# Patient Record
Sex: Male | Born: 1975 | Race: Black or African American | Hispanic: No | Marital: Married | Smoking: Never smoker
Health system: Southern US, Community
[De-identification: ages and names within clinical notes are randomized; demographics above are authoritative.]

## PROBLEM LIST (undated history)

## (undated) DIAGNOSIS — I509 Heart failure, unspecified: Secondary | ICD-10-CM

## (undated) DIAGNOSIS — I10 Essential (primary) hypertension: Secondary | ICD-10-CM

## (undated) DIAGNOSIS — N529 Male erectile dysfunction, unspecified: Secondary | ICD-10-CM

## (undated) DIAGNOSIS — N183 Chronic kidney disease, stage 3 unspecified: Secondary | ICD-10-CM

## (undated) HISTORY — PX: APPENDECTOMY: SHX54

## (undated) HISTORY — DX: Heart failure, unspecified: I50.9

## (undated) HISTORY — PX: BACK SURGERY: SHX140

## (undated) HISTORY — PX: NASAL SINUS SURGERY: SHX719

## (undated) HISTORY — DX: Chronic kidney disease, stage 3 unspecified: N18.30

## (undated) HISTORY — DX: Male erectile dysfunction, unspecified: N52.9

## (undated) HISTORY — PX: ANKLE SURGERY: SHX546

## (undated) HISTORY — PX: OTHER SURGICAL HISTORY: SHX169

## (undated) HISTORY — PX: SHOULDER ARTHROSCOPY WITH LABRAL REPAIR: SHX5691

---

## 1998-06-21 ENCOUNTER — Encounter: Admission: RE | Admit: 1998-06-21 | Discharge: 1998-09-19 | Payer: Self-pay | Admitting: Anesthesiology

## 1998-11-14 ENCOUNTER — Ambulatory Visit (HOSPITAL_COMMUNITY): Admission: RE | Admit: 1998-11-14 | Discharge: 1998-11-14 | Payer: Self-pay | Admitting: Orthopedic Surgery

## 1998-11-14 ENCOUNTER — Encounter: Payer: Self-pay | Admitting: Orthopedic Surgery

## 2000-07-24 ENCOUNTER — Encounter: Admission: RE | Admit: 2000-07-24 | Discharge: 2000-07-24 | Payer: Self-pay | Admitting: Internal Medicine

## 2000-07-24 ENCOUNTER — Encounter: Payer: Self-pay | Admitting: Internal Medicine

## 2000-12-10 ENCOUNTER — Emergency Department (HOSPITAL_COMMUNITY): Admission: EM | Admit: 2000-12-10 | Discharge: 2000-12-10 | Payer: Self-pay | Admitting: *Deleted

## 2003-05-03 ENCOUNTER — Emergency Department (HOSPITAL_COMMUNITY): Admission: EM | Admit: 2003-05-03 | Discharge: 2003-05-03 | Payer: Self-pay | Admitting: Emergency Medicine

## 2003-05-03 ENCOUNTER — Encounter: Payer: Self-pay | Admitting: Emergency Medicine

## 2004-04-21 ENCOUNTER — Emergency Department (HOSPITAL_COMMUNITY): Admission: EM | Admit: 2004-04-21 | Discharge: 2004-04-21 | Payer: Self-pay | Admitting: Family Medicine

## 2004-04-24 ENCOUNTER — Emergency Department (HOSPITAL_COMMUNITY): Admission: EM | Admit: 2004-04-24 | Discharge: 2004-04-24 | Payer: Self-pay | Admitting: Family Medicine

## 2005-03-02 ENCOUNTER — Emergency Department (HOSPITAL_COMMUNITY): Admission: EM | Admit: 2005-03-02 | Discharge: 2005-03-02 | Payer: Self-pay | Admitting: Family Medicine

## 2005-06-12 ENCOUNTER — Emergency Department (HOSPITAL_COMMUNITY): Admission: EM | Admit: 2005-06-12 | Discharge: 2005-06-12 | Payer: Self-pay | Admitting: Emergency Medicine

## 2005-07-02 ENCOUNTER — Emergency Department (HOSPITAL_COMMUNITY): Admission: EM | Admit: 2005-07-02 | Discharge: 2005-07-03 | Payer: Self-pay | Admitting: Emergency Medicine

## 2005-07-17 ENCOUNTER — Emergency Department (HOSPITAL_COMMUNITY): Admission: EM | Admit: 2005-07-17 | Discharge: 2005-07-17 | Payer: Self-pay | Admitting: Emergency Medicine

## 2005-07-24 ENCOUNTER — Ambulatory Visit: Payer: Self-pay | Admitting: Nurse Practitioner

## 2005-08-01 ENCOUNTER — Ambulatory Visit: Payer: Self-pay | Admitting: *Deleted

## 2005-08-23 ENCOUNTER — Ambulatory Visit: Payer: Self-pay | Admitting: Nurse Practitioner

## 2005-10-04 ENCOUNTER — Ambulatory Visit: Payer: Self-pay | Admitting: Nurse Practitioner

## 2005-10-06 ENCOUNTER — Emergency Department (HOSPITAL_COMMUNITY): Admission: EM | Admit: 2005-10-06 | Discharge: 2005-10-06 | Payer: Self-pay | Admitting: Emergency Medicine

## 2005-10-07 ENCOUNTER — Emergency Department (HOSPITAL_COMMUNITY): Admission: EM | Admit: 2005-10-07 | Discharge: 2005-10-07 | Payer: Self-pay | Admitting: Emergency Medicine

## 2005-10-08 ENCOUNTER — Ambulatory Visit (HOSPITAL_COMMUNITY): Admission: RE | Admit: 2005-10-08 | Discharge: 2005-10-08 | Payer: Self-pay | Admitting: Emergency Medicine

## 2005-10-09 ENCOUNTER — Inpatient Hospital Stay (HOSPITAL_COMMUNITY): Admission: EM | Admit: 2005-10-09 | Discharge: 2005-10-10 | Payer: Self-pay | Admitting: Emergency Medicine

## 2005-10-09 ENCOUNTER — Encounter: Payer: Self-pay | Admitting: Neurological Surgery

## 2005-11-23 ENCOUNTER — Encounter: Admission: RE | Admit: 2005-11-23 | Discharge: 2006-02-21 | Payer: Self-pay | Admitting: Neurological Surgery

## 2006-01-11 ENCOUNTER — Ambulatory Visit: Payer: Self-pay | Admitting: Nurse Practitioner

## 2006-03-27 ENCOUNTER — Ambulatory Visit: Payer: Self-pay | Admitting: Nurse Practitioner

## 2006-04-02 ENCOUNTER — Encounter: Admission: RE | Admit: 2006-04-02 | Discharge: 2006-07-01 | Payer: Self-pay | Admitting: Internal Medicine

## 2006-04-14 ENCOUNTER — Emergency Department (HOSPITAL_COMMUNITY): Admission: EM | Admit: 2006-04-14 | Discharge: 2006-04-14 | Payer: Self-pay | Admitting: Emergency Medicine

## 2006-06-20 ENCOUNTER — Ambulatory Visit: Payer: Self-pay | Admitting: Nurse Practitioner

## 2006-07-12 ENCOUNTER — Ambulatory Visit: Payer: Self-pay | Admitting: Nurse Practitioner

## 2006-09-11 ENCOUNTER — Ambulatory Visit: Payer: Self-pay | Admitting: Nurse Practitioner

## 2006-12-04 ENCOUNTER — Ambulatory Visit: Payer: Self-pay | Admitting: Nurse Practitioner

## 2007-01-22 ENCOUNTER — Ambulatory Visit (HOSPITAL_COMMUNITY): Admission: RE | Admit: 2007-01-22 | Discharge: 2007-01-22 | Payer: Self-pay | Admitting: Neurological Surgery

## 2007-02-20 ENCOUNTER — Encounter: Admission: RE | Admit: 2007-02-20 | Discharge: 2007-02-20 | Payer: Self-pay | Admitting: Orthopedic Surgery

## 2007-02-24 ENCOUNTER — Inpatient Hospital Stay (HOSPITAL_COMMUNITY): Admission: RE | Admit: 2007-02-24 | Discharge: 2007-03-01 | Payer: Self-pay | Admitting: Neurological Surgery

## 2007-08-04 ENCOUNTER — Encounter: Admission: RE | Admit: 2007-08-04 | Discharge: 2007-08-04 | Payer: Self-pay | Admitting: Orthopedic Surgery

## 2007-12-24 ENCOUNTER — Emergency Department (HOSPITAL_COMMUNITY): Admission: EM | Admit: 2007-12-24 | Discharge: 2007-12-24 | Payer: Self-pay | Admitting: Emergency Medicine

## 2008-01-28 ENCOUNTER — Ambulatory Visit: Payer: Self-pay | Admitting: Family Medicine

## 2008-02-07 ENCOUNTER — Emergency Department (HOSPITAL_COMMUNITY): Admission: EM | Admit: 2008-02-07 | Discharge: 2008-02-07 | Payer: Self-pay | Admitting: Family Medicine

## 2008-04-21 ENCOUNTER — Encounter: Admission: RE | Admit: 2008-04-21 | Discharge: 2008-06-10 | Payer: Self-pay | Admitting: Orthopedic Surgery

## 2008-06-21 ENCOUNTER — Ambulatory Visit (HOSPITAL_COMMUNITY): Admission: RE | Admit: 2008-06-21 | Discharge: 2008-06-21 | Payer: Self-pay | Admitting: Urology

## 2009-06-02 ENCOUNTER — Emergency Department (HOSPITAL_COMMUNITY): Admission: EM | Admit: 2009-06-02 | Discharge: 2009-06-02 | Payer: Self-pay | Admitting: Emergency Medicine

## 2009-11-22 ENCOUNTER — Encounter (INDEPENDENT_AMBULATORY_CARE_PROVIDER_SITE_OTHER): Payer: Self-pay | Admitting: Adult Health

## 2009-11-22 ENCOUNTER — Ambulatory Visit: Payer: Self-pay | Admitting: Internal Medicine

## 2009-11-22 LAB — CONVERTED CEMR LAB
ALT: 30 units/L (ref 0–53)
AST: 18 units/L (ref 0–37)
Albumin: 4.5 g/dL (ref 3.5–5.2)
Alkaline Phosphatase: 66 units/L (ref 39–117)
BUN: 11 mg/dL (ref 6–23)
CO2: 26 meq/L (ref 19–32)
Calcium: 8.7 mg/dL (ref 8.4–10.5)
Chlamydia, Swab/Urine, PCR: NEGATIVE
Chloride: 103 meq/L (ref 96–112)
Creatinine, Ser: 1.34 mg/dL (ref 0.40–1.50)
GC Probe Amp, Urine: NEGATIVE
Glucose, Bld: 91 mg/dL (ref 70–99)
Hgb A1c MFr Bld: 5.9 % (ref 4.6–6.1)
Microalb, Ur: 10.56 mg/dL — ABNORMAL HIGH (ref 0.00–1.89)
Potassium: 3.7 meq/L (ref 3.5–5.3)
Sodium: 142 meq/L (ref 135–145)
Total Bilirubin: 0.7 mg/dL (ref 0.3–1.2)
Total Protein: 6.8 g/dL (ref 6.0–8.3)
Vit D, 25-Hydroxy: <10 ng/mL — ABNORMAL LOW (ref 30–89)

## 2009-12-09 ENCOUNTER — Ambulatory Visit: Payer: Self-pay | Admitting: Internal Medicine

## 2009-12-09 ENCOUNTER — Encounter (INDEPENDENT_AMBULATORY_CARE_PROVIDER_SITE_OTHER): Payer: Self-pay | Admitting: Adult Health

## 2009-12-09 LAB — CONVERTED CEMR LAB
BUN: 11 mg/dL (ref 6–23)
CO2: 26 meq/L (ref 19–32)
Calcium: 9.3 mg/dL (ref 8.4–10.5)
Chloride: 104 meq/L (ref 96–112)
Cholesterol: 161 mg/dL (ref 0–200)
Creatinine, Ser: 1.23 mg/dL (ref 0.40–1.50)
Glucose, Bld: 94 mg/dL (ref 70–99)
HDL: 50 mg/dL (ref >39)
LDL Cholesterol: 102 mg/dL — ABNORMAL HIGH (ref 0–99)
Potassium: 3.9 meq/L (ref 3.5–5.3)
Sodium: 141 meq/L (ref 135–145)
Total CHOL/HDL Ratio: 3.2
Triglycerides: 47 mg/dL (ref <150)
VLDL: 9 mg/dL (ref 0–40)

## 2009-12-12 ENCOUNTER — Emergency Department (HOSPITAL_COMMUNITY): Admission: EM | Admit: 2009-12-12 | Discharge: 2009-12-12 | Payer: Self-pay | Admitting: Emergency Medicine

## 2010-09-02 ENCOUNTER — Emergency Department (HOSPITAL_COMMUNITY)
Admission: EM | Admit: 2010-09-02 | Discharge: 2010-09-02 | Payer: Self-pay | Source: Home / Self Care | Admitting: Family Medicine

## 2010-12-09 ENCOUNTER — Encounter: Payer: Self-pay | Admitting: Neurosurgery

## 2011-04-06 NOTE — Discharge Summary (Signed)
[name], [name]              ACCOUNT NO.:  [id number]   MEDICAL RECORD NO.:  [id number]          PATIENT TYPE:  INP   LOCATION:  3002                         FACILITY:  [facility]   PHYSICIAN:  Stefani Dama, M.D.  DATE OF BIRTH:  September 11, 1976   DATE OF ADMISSION:  10/09/2005  DATE OF DISCHARGE:  10/10/2005                                 DISCHARGE SUMMARY   CONDITION ON DISCHARGE:  Improved.   HOSPITAL COURSE:  [name] is a 35 year old individual who has had a  severe amount of back and right lower extremity pain with weakness in his  gastroc. He was treated conservatively; and had received extensive  parenteral and oral pain medications which was not controlling his pain; and  he developed difficulty controlling his urinary bladder. Whether this was  due to actual compression of his conus or medication effect is undetermined;  however, I suspect that this was largely due to the amount of medication  that the patient was receiving.  Nonetheless he was found to have a large  herniated nucleus pulposus at the L5-S1 level with compression of the right-  sided S1 nerve roots; and he had evidence of a weak gastroc on that right  side. He could not stand to bear weight; and despite maximum pain  medications to where the patient would be falling asleep, when he would be  stimulated to do on exam, he complained of excruciating pain. He was taken  to the operating room on 11/21 where he underwent surgical decompression of  a large fragment of disk at the L5-S1 level. Postoperatively he is feeling  considerably better though he still notes that he gets tingling into the  buttock region and some radicular pain in that right lower extremity. He is  now tolerating oral pain medications.  He has been ambulating and he has  control of his bladder. He is discharged home with prescription for Percocet  #60, without refills; Valium 5 mg #30, without refills. He will be seen in  the office in  three weeks' time.  His incision is clean and dry.   CONDITION ON DISCHARGE:  Improved.      Stefani Dama, M.D.  Electronically Signed     HJE/MEDQ  D:  10/10/2005  T:  10/10/2005  Job:  [id number]

## 2011-04-06 NOTE — Op Note (Signed)
NAMEVIRGIL, [name]              ACCOUNT NO.:  [id number]   MEDICAL RECORD NO.:  [id number]          PATIENT TYPE:  INP   LOCATION:  3012                         FACILITY:  [facility]   PHYSICIAN:  Stefani Dama, M.D.  DATE OF BIRTH:  10/31/1976   DATE OF PROCEDURE:  02/24/2007  DATE OF DISCHARGE:                               OPERATIVE REPORT   PREOPERATIVE DIAGNOSIS:  Recurrent herniated nucleus pulposus, L4-L5,  with spondylosis and radiculopathy.   POSTOPERATIVE DIAGNOSIS:  Repeat diskectomy at L4-L5, on the right with  decompression of L5 and S1 nerve roots, transforaminal decompression and  arthrodesis using TLIF bone spacer and local autograft and allograft,  segmental pedicle screw fixation L4-L5, posterolateral arthrodesis L4-L5  with local autograft and allograft.   SURGEON:  Dr. Danielle Dess.   FIRST ASSISTANT:  Dr. Delma Officer.   ANESTHESIA:  General endotracheal.   INDICATIONS:  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  He has a transitional vertebrae of  the L5-S1 segment, and the first moveable segment is actually L4-L5.  This is where his previous diskectomy was.  He now has recurrent disk  herniation and compromise of the nerve root with right sided radicular  pain.  He is taken to the operating room to undergo not only  decompression but stabilization, as he has developed significant  spondylitic degeneration and anterolisthesis.   PROCEDURE:  The patient was brought to the operating room supine on the  stretcher.  After smooth induction of general endotracheal anesthesia,  he was turned prone.  The back was prepped with DuraPrep and draped in a  sterile fashion.  A midline incision was creating and carried down to  the lumbodorsal fascia.  Previous scar was excised with an elliptical  incision around it.  The dissection was then carried down on  the right  side to expose the interlaminar space of L4 and L5, and the scar in this  area was released.  A large laminotomy was created, removing the  inferior portion of the lamina and facet process at L4.  This exposed  the common dural tube, and dissection was then undertaken along the  nerve root.  Here, the traversing nerve root exiting was typical for a  L4 nerve root.  However, the inferior nerve root look much more like a  S1 nerve root as it traveled straight down the lateral aspect of the  lamina and hugged the facette.  The scar in this area was released and  elevated, and the common dural tube and the nerve roots involved in this  segment were decompressed.  Transforaminal diskectomy was performed at  this level, and the disk was evacuated completely through this right-  sided approach.  Once the disk was evacuated and nerve roots were much  more decompressed, further decompression yielded the endplates, and the  endplates were removed with sharp curettage to increase the size of  space.  Ultimately, the interspace could be filled with a spacer which  was initially measured at 10 mm.  However, ultimately, a 12-mm spacer  could be fitted into this interspace.  Once the endplates were  completely curetted and prepared for grafting, a TLIF bone spacer was  tamped into position and centered.  The rest of the disk space was then  filled with local autograft and allograft to complete the posterior  interbody fusion.  Then, pedicle screws were placed in pedicles of L4-L5  with fluoroscopic guidance.  These were 6.5 x 50-mm screws in L4 and 6.5  x 45-mm screws in L5.  Radiographic confirmation with fluoroscopy was  obtained in both the AP and lateral projections.  Short 35-mm rods were  then used to connect the rods on the right side, and a 40-mm rod was  required on the left side.  Posterolateral grafting was then performed  in the lateral gutters that were decorticated previously.   The remainder  of the bone that was obtained along with some Helios sponge that was  packed into the lateral gutters.  Hemostasis was then obtained in the  soft tissues, and then the lumbodorsal fascia was reapproximated with #1  Vicryl in an interrupted fashion, 2-0 Vicryl was used subcutaneously, 3-  0 Vicryl subcuticularly.  Dry sterile dressing was applied to the skin.  Blood loss was estimated at 450 mL; 200 mL of Cell Saver blood was  returned to the patient.      Stefani Dama, M.D.  Electronically Signed     HJE/MEDQ  D:  02/28/2007  T:  03/01/2007  Job:  [id number]

## 2011-04-06 NOTE — Op Note (Signed)
[name], [name]              ACCOUNT NO.:  [id number]   MEDICAL RECORD NO.:  [id number]          PATIENT TYPE:  INP   LOCATION:  3002                         FACILITY:  [facility]   PHYSICIAN:  Stefani Dama, M.D.  DATE OF BIRTH:  01-31-1976   DATE OF PROCEDURE:  DATE OF DISCHARGE:                                 OPERATIVE REPORT   PREOPERATIVE DIAGNOSIS:  Herniated nucleus pulposus L5-S1 right with right  lumbar radiculopathy.   POSTOPERATIVE DIAGNOSIS:  Herniated nucleus pulposus L5-S1 right with right  lumbar radiculopathy.   PROCEDURE:  L5-S1 microdiskectomy on the right with operating microscope,  microdissection technique.   SURGEON:  Stefani Dama, M.D.   FIRST ASSISTANT:  Hilda Lias, M.D.   ANESTHESIA:  General endotracheal.   INDICATIONS:  [name] is a 35 year old individual who has had  significant back and right lower extremity pain with weakness in the  gastrocnemius.  He has evidence of compression of the S1 nerve root on that  right side.  He has been advised regarding surgical decompression as he has  been intractable with his pain; and the weakness is prominent.   PROCEDURE:  The patient was brought to the operating room supine on a  stretcher. After the smooth induction of general endotracheal anesthesia, he  was turned prone; the back was prepped with DuraPrep, and draped in sterile  fashion. A midline incision was created and carried down to lumbodorsal  fascia which was opened on the right side of midline.  The paraspinous  musculature was stripped from the interspace at the L5-S1 space; and this  space was identified positively with a radiograph. A laminotomy was then  created removing the inferior margin of the lamina of L5 out to the mesial  wall of the facet.  A partial facetectomy was completed also.  The common  dural tube was then identified and care was taken to protect a very  tenuously stretched S1 nerve root over a substantial  mass.  By dissecting  from lateral to medial, the nerve was gently moved off of a substantial  mass. This was incised on the lateral aspect and was found to be a large  fragment of disk with a thin veil of posterior longitudinal ligament over  it. Several fragments were removed and this allowed for good decompression  immediately of the S1 nerve root. Care was taken to explore the area and  several other fragments were found. These were partially adherent to  substantial thickening in the posterior longitudinal ligament.  These  fragments were resected and then on the medial aspect; there was noted be a  tear into the disk space and palpating here with a curved hook revealed some  other fragments of disk that were about to come through this opening.  It  was felt that because of the degree of degeneration that was present, in  this disk space, that this space should be cleaned out of any loose and  degenerated material.   For that reason the posterior longitudinal ligament was then opened fully  with a 15-blade and a formal diskectomy  was undertaken at the L5-S1 space.  All this work was done with the help of Dr. Jeral Fruit who performed the lateral  portion of the operation; and all this work was also done under the  operating microscope, including the laminotomy and facetectomy; and then, of  course, this microdiskectomy.  In the end, the disk space was evacuated of  significant quantity of severely degenerated disk material. The nerve root  was decompressed both at S1 and then a central portion of the canal above  this area. Hemostasis was achieved in the soft tissues and 15 mg of Toradol  was left in the  epidural space; and ultimately the wound was closed with #1 Vicryl  interrupted fashion, and 2-0 Vicryl in the subcutaneous tissues and 3-0  Vicryl subcuticularly. Dermabond was placed on the skin. The patient  tolerated procedure well was returned to recovery in stable  condition.      Stefani Dama, M.D.  Electronically Signed     HJE/MEDQ  D:  10/09/2005  T:  10/10/2005  Job:  [id number]

## 2011-04-06 NOTE — Discharge Summary (Signed)
[name], [name]              ACCOUNT NO.:  [id number]   MEDICAL RECORD NO.:  [id number]          PATIENT TYPE:  INP   LOCATION:  3012                         FACILITY:  [facility]   PHYSICIAN:  Stefani Dama, M.D.  DATE OF BIRTH:  [birth year]   DATE OF ADMISSION:  02/24/2007  DATE OF DISCHARGE:  03/01/2007                               DISCHARGE SUMMARY   ADMITTING DIAGNOSIS:  Recurrent herniated nucleus pulposus, L4-5, with  spondylosis and radiculopathy.   DISCHARGE AND FINAL DIAGNOSIS:  Recurrent herniated nucleus pulposus, L4-  5, with spondylosis and radiculopathy.   CONDITION ON DISCHARGE:  Improving.   HOSPITAL COURSE:  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  The patient has  an apparent recurrent disk herniation with right lumbar radiculopathy.  He is advised regarding surgical decompression in addition to  arthrodesis as he has developed degenerative changes that are more  severe with slight anterolisthesis of the L4-5 segment.  He was taken to  the operating room on the 7th, where he underwent this procedure and he  tolerated it well.  Postoperatively he has been mobilized.  His incision  has remained clean and dry.  He did have substantial amounts of pain as  the patient is a large, muscular fellow.  This was relieved with some  difficulty using PCA.  He has now been mobilized and is independent in  is function.  Bowel and bladder function appears to be returned to  normal.   He is given a prescription for Percocet #60 without refills, Valium 5 mg  #40 without refills.  Will be seen in the office in 3 weeks' time for  further follow-up.      Stefani Dama, M.D.  Electronically Signed     HJE/MEDQ  D:  02/28/2007  T:  03/01/2007  Job:  [id number]

## 2011-04-06 NOTE — H&P (Signed)
NAMESTEFANOS, [name]              ACCOUNT NO.:  [id number]   MEDICAL RECORD NO.:  [id number]          PATIENT TYPE:  INP   LOCATION:  3002                         FACILITY:  [facility]   PHYSICIAN:  Stefani Dama, M.D.  DATE OF BIRTH:  June 04, 1976   DATE OF ADMISSION:  10/09/2005  DATE OF DISCHARGE:                                HISTORY & PHYSICAL   ADMITTING DIAGNOSIS:  Herniated nucleus pulposus, L5-S1 on the right, with  right lumbar radiculopathy.   HISTORY OF PRESENT ILLNESS:  The patient is a 35 year old right-handed  individual who has had severe back pain for the last 10 years and started  having back and right lower extremity pain several days ago that was quite  severe.  He notes that the first right lower extremity pain might have  started as far back as in February.  He tells me that 10 years ago he had a  work-related injury where he ended up with some back pain, and on and off  for the last 10 years he has had recurrent episodes of back pain.  He had an  MRI in August and has been treated with various anti-inflammatory  medications, steroid dose packs, various pain pills, and in the past 3 days  had showed up in the emergency department daily since this past Friday.  Each time, he was given additional pain medication and found that his pain  could not be controlled.  This evening when he presented to the emergency  department, he was given an injection for pain medication.  He subsequently  found that he could not control his bladder, soiled himself.  An MRI was  ordered, and this demonstrates the presence of a herniated nucleus pulposus  at L5-S1, eccentric to the right side.  The MRI had actually been scheduled  for the patient to have today.  He had been seen on October 05, 2005 by my  partner, Dr. Orbie Hurst.  Because of the recent problem with the urination,  I was asked to see the patient.  I advised admission.  He was transferred  from [facility] to  [facility].  He notes that he has pain in the  entirety of the right leg with numbness down to the bottom of the foot.  The  right foot feels weak, but it is extremely difficult to move.  He reveals  that he has great difficulty getting up and moving, and bearing any weight  on that right leg is extremely uncomfortable and very painful.   PAST MEDICAL HISTORY:  1.  The patient tells me he has a murmur, but has no other medical problems.  2.  He may have had a slight bit of asthma at one time also.   PAST SURGICAL HISTORY:  He has not had any previous surgeries.   ALLERGIES:  No known drug allergies.   CURRENT MEDICATIONS:  He has been on a whole host of nonsteroidal anti-  inflammatories including -  1.  Ibuprofen.  2.  Vicodin for pain.  3.  Percocet for pain.   SOCIAL HISTORY:  He  is married.  He is unemployed, and he does not smoke or  use alcohol.   SYSTEMS REVIEW/FAMILY HISTORY:  Reviewed with the patient, and not  significant for any notable findings, particularly in the entire systems  review, except for the history of the asthma, for which the patient does not  take any medication at the current time.   PHYSICAL EXAMINATION:  GENERAL:  He is an arousable individual who is quite  sedated with a good bit of pain medication.  He is currently on a PCA  morphine pump.  When he does arouse, he moves about turning left to right in  a log roll fashion very rigidly, and he notes that he has excruciating pain  with even slight turning to the right or to the left.  Straight leg raising  on the right side is markedly positive.  The patient displays weakness in  the gastroc on that right side, which can be overcome with hand strength.  His tibialis anterior strength appears 5/5 bilaterally.  His sensation is  intact in the distal lower extremities, though diminished on the compared to  the left.  Reflexes absent in the right Achilles.  Straight leg raising is  markedly positive with any  motion of that right leg.  Cross straight leg  raising is positive at 15 degrees on the left side for the right pain.  Patrick's maneuver is negative on the left side; it cannot be performed on  the right side, as he cannot bend his hip in that direction.  Palpation over  his back reveals that he has severe paraspinous muscular spasm that is  easily notable.  HEENT:  Normal, except for halitosis.  NECK:  No masses.  No bruits are heard.  LUNG:  Clear to auscultation.  HEART:  Regular rate and rhythm.  ABDOMEN:  Soft.  Bowel sounds positive.  No masses are palpable.  EXTREMITIES:  No clubbing, cyanosis, or edema.   IMPRESSION:  The patient has evidence of a herniated nucleus pulposus at L5-  S1 eccentric to the right side.  He is now taken to the operating room to  undergo surgical decompression of this disk herniation.      Stefani Dama, M.D.  Electronically Signed     HJE/MEDQ  D:  10/09/2005  T:  10/09/2005  Job:  [id number]

## 2011-04-25 ENCOUNTER — Inpatient Hospital Stay (INDEPENDENT_AMBULATORY_CARE_PROVIDER_SITE_OTHER)
Admission: RE | Admit: 2011-04-25 | Discharge: 2011-04-25 | Disposition: A | Discharge status: Home / Self Care | Payer: Medicare Other | Source: Ambulatory Visit | Attending: Family Medicine | Admitting: Family Medicine

## 2011-04-25 ENCOUNTER — Emergency Department (HOSPITAL_COMMUNITY)
Admission: EM | Admit: 2011-04-25 | Discharge: 2011-04-25 | Disposition: A | Discharge status: Home / Self Care | Payer: Medicare Other | Attending: Emergency Medicine | Admitting: Emergency Medicine

## 2011-04-25 DIAGNOSIS — R7989 Other specified abnormal findings of blood chemistry: Secondary | ICD-10-CM

## 2011-04-25 DIAGNOSIS — J45909 Unspecified asthma, uncomplicated: Secondary | ICD-10-CM | POA: Insufficient documentation

## 2011-04-25 DIAGNOSIS — R631 Polydipsia: Secondary | ICD-10-CM | POA: Insufficient documentation

## 2011-04-25 DIAGNOSIS — R358 Other polyuria: Secondary | ICD-10-CM | POA: Insufficient documentation

## 2011-04-25 DIAGNOSIS — I10 Essential (primary) hypertension: Secondary | ICD-10-CM | POA: Insufficient documentation

## 2011-04-25 DIAGNOSIS — E1169 Type 2 diabetes mellitus with other specified complication: Secondary | ICD-10-CM | POA: Insufficient documentation

## 2011-04-25 DIAGNOSIS — R3589 Other polyuria: Secondary | ICD-10-CM | POA: Insufficient documentation

## 2011-04-25 DIAGNOSIS — K219 Gastro-esophageal reflux disease without esophagitis: Secondary | ICD-10-CM | POA: Insufficient documentation

## 2011-04-25 DIAGNOSIS — H538 Other visual disturbances: Secondary | ICD-10-CM | POA: Insufficient documentation

## 2011-04-25 LAB — POCT I-STAT, CHEM 8
BUN: 16 mg/dL (ref 6–23)
Calcium, Ion: 1.18 mmol/L (ref 1.12–1.32)
Chloride: 92 mEq/L — ABNORMAL LOW (ref 96–112)
Creatinine, Ser: 1.7 mg/dL — ABNORMAL HIGH (ref 0.4–1.5)
Glucose, Bld: 604 mg/dL (ref 70–99)
HCT: 50 % (ref 39.0–52.0)
Hemoglobin: 17 g/dL (ref 13.0–17.0)
Potassium: 4.9 mEq/L (ref 3.5–5.1)
Sodium: 133 mEq/L — ABNORMAL LOW (ref 135–145)
TCO2: 30 mmol/L (ref 0–100)

## 2011-04-25 LAB — BASIC METABOLIC PANEL
BUN: 15 mg/dL (ref 6–23)
CO2: 30 mEq/L (ref 19–32)
Calcium: 9.5 mg/dL (ref 8.4–10.5)
Chloride: 95 mEq/L — ABNORMAL LOW (ref 96–112)
Creatinine, Ser: 1.41 mg/dL (ref 0.4–1.5)
GFR calc Af Amer: >60 mL/min (ref >60)
GFR calc non Af Amer: 57 mL/min — ABNORMAL LOW (ref >60)
Glucose, Bld: 448 mg/dL — ABNORMAL HIGH (ref 70–99)
Potassium: 4.3 mEq/L (ref 3.5–5.1)
Sodium: 135 mEq/L (ref 135–145)

## 2011-04-25 LAB — GLUCOSE, CAPILLARY
Glucose-Capillary: >600 mg/dL (ref 70–99)
Glucose-Capillary: 465 mg/dL — ABNORMAL HIGH (ref 70–99)
Glucose-Capillary: 368 mg/dL — ABNORMAL HIGH (ref 70–99)

## 2011-04-25 LAB — POCT URINALYSIS DIP (DEVICE)
Bilirubin Urine: NEGATIVE
Glucose, UA: 500 mg/dL — AB
Hgb urine dipstick: NEGATIVE
Ketones, ur: NEGATIVE mg/dL
Nitrite: NEGATIVE
Protein, ur: NEGATIVE mg/dL
Specific Gravity, Urine: <=1.005 (ref 1.005–1.030)
Urobilinogen, UA: 0.2 mg/dL (ref 0.0–1.0)
pH: 6 (ref 5.0–8.0)

## 2011-08-22 ENCOUNTER — Emergency Department (HOSPITAL_COMMUNITY)
Admission: EM | Admit: 2011-08-22 | Discharge: 2011-08-23 | Disposition: A | Discharge status: Home / Self Care | Payer: Medicare Other | Attending: Emergency Medicine | Admitting: Emergency Medicine

## 2011-08-22 DIAGNOSIS — E119 Type 2 diabetes mellitus without complications: Secondary | ICD-10-CM | POA: Insufficient documentation

## 2011-08-22 DIAGNOSIS — I10 Essential (primary) hypertension: Secondary | ICD-10-CM | POA: Insufficient documentation

## 2011-08-22 DIAGNOSIS — S0180XA Unspecified open wound of other part of head, initial encounter: Secondary | ICD-10-CM | POA: Insufficient documentation

## 2011-08-22 DIAGNOSIS — Z794 Long term (current) use of insulin: Secondary | ICD-10-CM | POA: Insufficient documentation

## 2011-08-22 DIAGNOSIS — IMO0002 Reserved for concepts with insufficient information to code with codable children: Secondary | ICD-10-CM | POA: Insufficient documentation

## 2012-01-30 ENCOUNTER — Emergency Department: Payer: Self-pay | Admitting: Emergency Medicine

## 2012-01-30 DIAGNOSIS — S0180XA Unspecified open wound of other part of head, initial encounter: Secondary | ICD-10-CM | POA: Diagnosis not present

## 2012-01-30 DIAGNOSIS — IMO0002 Reserved for concepts with insufficient information to code with codable children: Secondary | ICD-10-CM | POA: Diagnosis not present

## 2012-02-07 ENCOUNTER — Emergency Department: Payer: Self-pay | Admitting: Emergency Medicine

## 2012-02-07 DIAGNOSIS — R040 Epistaxis: Secondary | ICD-10-CM | POA: Diagnosis not present

## 2012-02-07 DIAGNOSIS — I10 Essential (primary) hypertension: Secondary | ICD-10-CM | POA: Diagnosis not present

## 2012-02-07 DIAGNOSIS — R6889 Other general symptoms and signs: Secondary | ICD-10-CM | POA: Diagnosis not present

## 2012-02-07 DIAGNOSIS — F411 Generalized anxiety disorder: Secondary | ICD-10-CM | POA: Diagnosis not present

## 2012-02-07 DIAGNOSIS — Z79899 Other long term (current) drug therapy: Secondary | ICD-10-CM | POA: Diagnosis not present

## 2012-02-08 LAB — CBC
HCT: 45.2 % (ref 40.0–52.0)
HGB: 15.1 g/dL (ref 13.0–18.0)
MCH: 28.6 pg (ref 26.0–34.0)
MCHC: 33.3 g/dL (ref 32.0–36.0)
MCV: 86 fL (ref 80–100)
Platelet: 144 10*3/uL — ABNORMAL LOW (ref 150–440)
RBC: 5.27 10*6/uL (ref 4.40–5.90)
RDW: 13.9 % (ref 11.5–14.5)
WBC: 3.7 10*3/uL — ABNORMAL LOW (ref 3.8–10.6)

## 2012-02-08 LAB — COMPREHENSIVE METABOLIC PANEL
Albumin: 4.1 g/dL (ref 3.4–5.0)
Alkaline Phosphatase: 69 U/L (ref 50–136)
Anion Gap: 9 (ref 7–16)
BUN: 18 mg/dL (ref 7–18)
Bilirubin,Total: 0.5 mg/dL (ref 0.2–1.0)
Calcium, Total: 8.6 mg/dL (ref 8.5–10.1)
Chloride: 104 mmol/L (ref 98–107)
Co2: 28 mmol/L (ref 21–32)
Creatinine: 1.27 mg/dL (ref 0.60–1.30)
EGFR (African American): >60
EGFR (Non-African Amer.): >60
Glucose: 89 mg/dL (ref 65–99)
Osmolality: 283 (ref 275–301)
Potassium: 3.9 mmol/L (ref 3.5–5.1)
SGOT(AST): 23 U/L (ref 15–37)
SGPT (ALT): 37 U/L
Sodium: 141 mmol/L (ref 136–145)
Total Protein: 7.7 g/dL (ref 6.4–8.2)

## 2012-02-09 ENCOUNTER — Emergency Department (INDEPENDENT_AMBULATORY_CARE_PROVIDER_SITE_OTHER)
Admission: EM | Admit: 2012-02-09 | Discharge: 2012-02-09 | Disposition: A | Discharge status: Nursing Home (Includes ICF) | Payer: Medicare Other | Source: Home / Self Care | Attending: Emergency Medicine | Admitting: Emergency Medicine

## 2012-02-09 ENCOUNTER — Other Ambulatory Visit: Payer: Self-pay

## 2012-02-09 ENCOUNTER — Encounter (HOSPITAL_COMMUNITY): Payer: Self-pay | Admitting: *Deleted

## 2012-02-09 ENCOUNTER — Encounter (HOSPITAL_COMMUNITY): Payer: Self-pay | Admitting: Emergency Medicine

## 2012-02-09 ENCOUNTER — Emergency Department (HOSPITAL_COMMUNITY)
Admission: EM | Admit: 2012-02-09 | Discharge: 2012-02-09 | Disposition: A | Discharge status: Home / Self Care | Payer: Medicare Other | Attending: Emergency Medicine | Admitting: Emergency Medicine

## 2012-02-09 DIAGNOSIS — I10 Essential (primary) hypertension: Secondary | ICD-10-CM | POA: Diagnosis not present

## 2012-02-09 DIAGNOSIS — R040 Epistaxis: Secondary | ICD-10-CM

## 2012-02-09 DIAGNOSIS — E119 Type 2 diabetes mellitus without complications: Secondary | ICD-10-CM | POA: Diagnosis not present

## 2012-02-09 HISTORY — DX: Essential (primary) hypertension: I10

## 2012-02-09 LAB — POCT I-STAT, CHEM 8
BUN: 17 mg/dL (ref 6–23)
Calcium, Ion: 1.25 mmol/L (ref 1.12–1.32)
Chloride: 105 mEq/L (ref 96–112)
Creatinine, Ser: 1.3 mg/dL (ref 0.50–1.35)
Glucose, Bld: 75 mg/dL (ref 70–99)
HCT: 45 % (ref 39.0–52.0)
Hemoglobin: 15.3 g/dL (ref 13.0–17.0)
Potassium: 4 mEq/L (ref 3.5–5.1)
Sodium: 145 mEq/L (ref 135–145)
TCO2: 29 mmol/L (ref 0–100)

## 2012-02-09 MED ORDER — OXYMETAZOLINE HCL 0.05 % NA SOLN
1.0000 | Freq: Once | NASAL | Status: AC
  Administered 2012-02-09: 1 via NASAL
  Filled 2012-02-09: qty 15

## 2012-02-09 MED ORDER — METOPROLOL TARTRATE 1 MG/ML IV SOLN: 10.0000 mg | Freq: Once | INTRAVENOUS | Status: DC

## 2012-02-09 NOTE — Discharge Instructions (Signed)

## 2012-02-09 NOTE — ED Notes (Signed)
Called to treatment room during exam.  When head leaned back, bleeding resumed profusely.  Patient has nasal packing in left nare as placed by dr Ladon Applebaum.  Patient continues to have dripping blood from right nare, reports bleeding in back of throat continues.  Patient continues to talk and joke.

## 2012-02-09 NOTE — ED Notes (Addendum)
Pt resting quietly at the time. Vital signs stable. No active bleeding noted. Pt denies pain. Remains alert and oriented x4. No signs of distress noted at present. Family at bedside.

## 2012-02-09 NOTE — ED Notes (Addendum)
Patient transferred from [facility] due to having uncontrolled nose bleed and hypertension.  Patient with packing to the left nare.  Patient states the bleeding seems to have slowed down.  Patient recently started on altace for bp control.  Patient with no s/sx of distress.  Patient reports onset of bleeding today at 12noon.  Patient had similar event on Wed of this week and was treated and released.  Patient denies any pain.  Denies headache

## 2012-02-09 NOTE — ED Provider Notes (Signed)
History     CSN: [id number]  Arrival date & time 02/09/12  1226   First MD Initiated Contact with Patient 02/09/12 1234      Chief Complaint  Patient presents with  . Epistaxis    (Consider location/radiation/quality/duration/timing/severity/associated sxs/prior treatment) HPI Comments: Around 12 noon today was just inside of  Car and started bleeding,  I believe from my left side first then on my right side it was abundant to the point that it made me gag and cough several times. This past Wednesday was seen at [facility] as I had also bleeding from my nose and was told my blood pressure was elevated they treated me by spray my nose was open and placing some cotton balls after the bleeding stopped recently homes the next morning.   On nasal bleeding patient denies any physical effort, no speaking, cough, or localize injury or trauma. "I was just sitting in a car"  Patient is a 36 y.o. male presenting with nosebleeds. The history is provided by the patient and a relative.  Epistaxis  This is a recurrent problem. The current episode started 1 to 2 hours ago. The problem occurs constantly. The problem has been gradually worsening. The problem is associated with an unknown factor. The bleeding has been from both nares. The treatment provided no relief. His past medical history is significant for frequent nosebleeds. His past medical history does not include bleeding disorder or nose-picking.    Past Medical History  Diagnosis Date  . Hypertension   . Diabetes mellitus     Past Surgical History  Procedure Date  . Back surgery   . Back fusion     l4,l5  . Ankle surgery     both     History reviewed. No pertinent family history.  History  Substance Use Topics  . Smoking status: Never Smoker   . Smokeless tobacco: Not on file  . Alcohol Use: No      Review of Systems  Constitutional: Negative for diaphoresis, activity change and fatigue.  HENT: Positive for  nosebleeds. Negative for congestion and rhinorrhea.   Respiratory: Negative for cough and shortness of breath.   Cardiovascular: Negative for palpitations and leg swelling.    Allergies  Review of patient's allergies indicates no known allergies.  Home Medications   Current Outpatient Rx  Name Route Sig Dispense Refill  . AUGMENTIN PO Oral Take by mouth.    Marland Kitchen RAMIPRIL 10 MG PO TABS Oral Take 10 mg by mouth 2 (two) times daily.      BP 204/137  Pulse 79  Temp(Src) 97.6 F (36.4 C) (Oral)  Resp 20  SpO2 97%  Physical Exam  Constitutional: He appears distressed.  HENT:  Head: Normocephalic.  Nose: Epistaxis is observed.    Mouth/Throat: Uvula is midline, oropharynx is clear and moist and mucous membranes are normal.  Eyes: Conjunctivae are normal.  Neck: Neck supple.    ED Course  Procedures (including critical care time)  Labs Reviewed - No data to display No results found.   1. Epistaxis       MDM  Ongoing recurrent epistaxis with hypertensive crisis. Vision recently seen at [facility] with epistatic and no intranasal device was used topical treatment use we assume either side Silver nitrate or Afrin. Patient is noted to be hypertensive after left-sided Rhinocort inserted noted decreased bleeding. Still with some posterior pharynx dripping not at the same rate as previously observed patient will be transferred to the emergency  department for further monitoring observation and to as well blood pressure control as this was a severe episode of epistaxis        Jimmie Molly, MD 02/09/12 1350

## 2012-02-09 NOTE — ED Notes (Signed)
C/o nose bleed.  Episode started approx 12:00-12:15 today, currently has tissue in both nares and reports bleeding remains in back of throat, but is slowing.  Denies pain. Nose bled this past Wednesday , seen in [facility] emergency department.  They sprayed nose and placed medicine in nose with cotton balls.  Bleeding stopped and sent home the following morning.  Denies cough, cold sinus congestion.

## 2012-02-09 NOTE — ED Notes (Signed)
Pt discharged home. Vital signs stable. No further questions.

## 2012-02-09 NOTE — ED Notes (Signed)
Instructed to put on gown and reminded to turn off phones when physician arrived in room.  Instructed to leave paper in nares until physician instructed otherwise

## 2012-02-09 NOTE — ED Notes (Signed)
Nose bleeding controlled at the time. Pt applying pressure with gauze. No signs of distress noted. Will continue to monitor.

## 2012-02-09 NOTE — ED Notes (Signed)
Pt presents to department from [facility] for evaluation of uncontrolled nosebleed. Pt states onset today at 12:00pm. Pt states same occurred on Wednesday, was treated and sent home. Pt states chronic issues with hypertension. Was recently placed on Altace. Packing to L nostril upon arrival, nose continues to bleed. Denies pain. Pt is conscious alert and oriented x4. Skin warm and dry. No signs of distress noted at the present.

## 2012-02-09 NOTE — ED Notes (Signed)
Pt undressed, in gown, on monitor, continuous pulse oximetry and blood pressure cuff; EKG performed 

## 2012-02-09 NOTE — ED Provider Notes (Signed)
History     CSN: [id number]  Arrival date & time 02/09/12  1412   First MD Initiated Contact with Patient 02/09/12 1429      Chief Complaint  Patient presents with  . Epistaxis  . Hypertension    (Consider location/radiation/quality/duration/timing/severity/associated sxs/prior treatment) HPI  35yoM h/o NIDDM, HTN pw epistaxis.  he states that he had similar episode of epistaxis 2 days ago. He was seen at [facility]. He states that Afrin was placed in both nares and he had packing inserted. He was observed overnight and discharged in the morning without the packing. He states that today approximately noon he began to have epistaxis from his left nare. He states that he did begin to have bleeding at the right near and the back of his throat. He was seen in urgent care where he was noted to have elevated blood pressure. His left near was packed with a Rhino Rocket 4.5 cm. The patient denies headache, dizziness. He denies chest pain, shortness of breath, difficulty urinating. He states that he has been compliant with his antihypertensives including Altace. No anticoagulation. No trauma.   Past Medical History  Diagnosis Date  . Hypertension   . Diabetes mellitus     Past Surgical History  Procedure Date  . Back surgery   . Back fusion     l4,l5  . Ankle surgery     both     No family history on file.  History  Substance Use Topics  . Smoking status: Never Smoker   . Smokeless tobacco: Not on file  . Alcohol Use: No      Review of Systems  All other systems reviewed and are negative.   except as noted HPI   Allergies  Review of patient's allergies indicates no known allergies.  Home Medications   Current Outpatient Rx  Name Route Sig Dispense Refill  . AMOXICILLIN-POT CLAVULANATE 875-125 MG PO TABS Oral Take 1 tablet by mouth 2 (two) times daily. Started 3/13 Last dose will be this PM 3/23    . RAMIPRIL 10 MG PO TABS Oral Take 10 mg by mouth 2 (two) times  daily.      BP 152/88  Pulse 58  Temp(Src) 98.7 F (37.1 C) (Oral)  Resp 18  Ht 6\' 2"  (1.88 m)  Wt 234 lb (106.142 kg)  BMI 30.04 kg/m2  SpO2 99%  Physical Exam  Nursing note and vitals reviewed. Constitutional: He is oriented to person, place, and time. He appears well-developed and well-nourished. No distress.  HENT:  Head: Atraumatic.  Mouth/Throat: Oropharynx is clear and moist.       Lt nare with packing in place Blood clot R nare. No active bleeding +Blood but no active bleeding posterior OP  Eyes: Conjunctivae are normal. Pupils are equal, round, and reactive to light.  Neck: Neck supple.  Cardiovascular: Normal rate, regular rhythm, normal heart sounds and intact distal pulses.  Exam reveals no gallop and no friction rub.   No murmur heard. Pulmonary/Chest: Effort normal. No respiratory distress. He has no wheezes. He has no rales.  Abdominal: Soft. Bowel sounds are normal. There is no tenderness. There is no rebound and no guarding.  Musculoskeletal: Normal range of motion. He exhibits no edema and no tenderness.  Neurological: He is alert and oriented to person, place, and time.  Skin: Skin is warm and dry.  Psychiatric: He has a normal mood and affect.    Date: 02/09/2012  Rate: 62  Rhythm: normal sinus  rhythm  QRS Axis: normal  Intervals: normal  ST/T Wave abnormalities: nonspecific T wave changes inferolateral  Conduction Disutrbances:none  Narrative Interpretation:   Old EKG Reviewed: changes noted inferior t wave inversions previously noted.   ED Course  Procedures (including critical care time)   Labs Reviewed  POCT I-STAT, CHEM 8   No results found.   1. Hypertension   2. Epistaxis      MDM  Hypertension, epistaxis. Bleeding controlled at this time. Will check Hgb. EKG unremarkable. Recheck blood pressure after patient settled. Pt without associated CP/SOb/abd pain, difficulty urinating.  Lt nare rhinorocket fell out in ED. No active  bleeding noted anterior nares b/l. Pt states no blood down back of throat. Afrin placed b/l and pressure held.   BP 152/88  Pulse 58  Temp(Src) 98.7 F (37.1 C) (Oral)  Resp 18  Ht 6\' 2"  (1.88 m)  Wt 234 lb (106.142 kg)  BMI 30.04 kg/m2  SpO2 99%  1530 Blood pressure improving from previous without intervention. Still no active bleeding in the ED. Hgb 15.   BP 152/88  Pulse 58  Temp(Src) 98.7 F (37.1 C) (Oral)  Resp 18  Ht 6\' 2"  (1.88 m)  Wt 234 lb (106.142 kg)  BMI 30.04 kg/m2  SpO2 99%  Patient has been monitored in th ED for approx 2 hours. No active bleeding. Given option of repacking Lt nare with rhinorocket and declined. BP improved from previous. Recent increase in antihypertensives 2 days ago. Will not change meds now. Has ability to monitor at home. Needs PMD referral as outpatient. Given strict precautions for return.     Forbes Cellar, MD 02/09/12 [phone]

## 2012-02-09 NOTE — Discharge Instructions (Signed)
Hypertension Information As your heart beats, it forces blood through your arteries. This force is your blood pressure. If the pressure is too high, it is called hypertension (HTN) or high blood pressure. HTN is dangerous because you may have it and not know it. High blood pressure may mean that your heart has to work harder to pump blood. Your arteries may be narrow or stiff. The extra work puts you at risk for heart disease, stroke, and other problems.  Blood pressure consists of two numbers, a higher number over a lower, 110/72, for example. It is stated as "110 over 72." The ideal is below 120 for the top number (systolic) and under 80 for the bottom (diastolic).  You should pay close attention to your blood pressure if you have certain conditions such as:  Heart failure.   Prior heart attack.   Diabetes   Chronic kidney disease.   Prior stroke.   Multiple risk factors for heart disease.  To see if you have HTN, your blood pressure should be measured while you are seated with your arm held at the level of the heart. It should be measured at least twice. A one-time elevated blood pressure reading (especially in the Emergency Department) does not mean that you need treatment. There may be conditions in which the blood pressure is different between your right and left arms. It is important to see your caregiver soon for a recheck. Most people have essential hypertension which means that there is not a specific cause. This type of high blood pressure may be lowered by changing lifestyle factors such as:  Stress.   Smoking.   Lack of exercise.   Excessive weight.   Drug/tobacco/alcohol use.   Eating less salt.  Most people do not have symptoms from high blood pressure until it has caused damage to the body. Effective treatment can often prevent, delay or reduce that damage. TREATMENT  Treatment for high blood pressure, when a cause has been identified, is directed at the cause. There  are a large number of medications to treat HTN. These fall into several categories, and your caregiver will help you select the medicines that are best for you. Medications may have side effects. You should review side effects with your caregiver. If your blood pressure stays high after you have made lifestyle changes or started on medicines,   Your medication(s) may need to be changed.   Other problems may need to be addressed.   Be certain you understand your prescriptions, and know how and when to take your medicine.   Be sure to follow up with your caregiver within the time frame advised (usually within two weeks) to have your blood pressure rechecked and to review your medications.   If you are taking more than one medicine to lower your blood pressure, make sure you know how and at what times they should be taken. Taking two medicines at the same time can result in blood pressure that is too low.  Document Released: 01/08/2006 Document Revised: 07/18/2011 Document Reviewed: 01/15/2008 [facility] Patient Information 2012 [place], [place].  Nosebleed Nosebleeds can be caused by many conditions including trauma, infections, polyps, foreign bodies, dry mucous membranes or climate, medications and air conditioning. Most nosebleeds occur in the front of the nose. It is because of this location that most nosebleeds can be controlled by pinching the nostrils gently and continuously. Do this for at least 10 to 20 minutes. The reason for this long continuous pressure is that you  must hold it long enough for the blood to clot. If during that 10 to 20 minute time period, pressure is released, the process may have to be started again. The nosebleed may stop by itself, quit with pressure, need concentrated heating (cautery) or stop with pressure from packing. HOME CARE INSTRUCTIONS   If your nose was packed, try to maintain the pack inside until your caregiver removes it. If a gauze pack was used and it  starts to fall out, gently replace or cut the end off. Do not cut if a balloon catheter was used to pack the nose. Otherwise, do not remove unless instructed.   Avoid blowing your nose for 12 hours after treatment. This could dislodge the pack or clot and start bleeding again.   If the bleeding starts again, sit up and bending forward, gently pinch the front half of your nose continuously for 20 minutes.   If bleeding was caused by dry mucous membranes, cover the inside of your nose every morning with a petroleum or antibiotic ointment. Use your little fingertip as an applicator. Do this as needed during dry weather. This will keep the mucous membranes moist and allow them to heal.   Maintain humidity in your home by using less air conditioning or using a humidifier.   Do not use aspirin or medications which make bleeding more likely. Your caregiver can give you recommendations on this.   Resume normal activities as able but try to avoid straining, lifting or bending at the waist for several days.   If the nosebleeds become recurrent and the cause is unknown, your caregiver may suggest laboratory tests.  SEEK IMMEDIATE MEDICAL CARE IF:   Bleeding recurs and cannot be controlled.   There is unusual bleeding from or bruising on other parts of the body.   You have a fever.   Nosebleeds continue.   There is any worsening of the condition which originally brought you in.   You become lightheaded, feel faint, become sweaty or vomit blood.  MAKE SURE YOU:   Understand these instructions.   Will watch your condition.   Will get help right away if you are not doing well or get worse.  Document Released: 08/15/2005 Document Revised: 10/25/2011 Document Reviewed: 10/07/2009 [facility] Patient Information 2012 [place], [place].

## 2012-02-11 ENCOUNTER — Encounter (HOSPITAL_COMMUNITY): Payer: Self-pay | Admitting: *Deleted

## 2012-02-11 ENCOUNTER — Emergency Department (HOSPITAL_COMMUNITY)
Admission: EM | Admit: 2012-02-11 | Discharge: 2012-02-11 | Disposition: A | Discharge status: Home / Self Care | Payer: Medicare Other | Attending: Emergency Medicine | Admitting: Emergency Medicine

## 2012-02-11 DIAGNOSIS — E119 Type 2 diabetes mellitus without complications: Secondary | ICD-10-CM | POA: Diagnosis not present

## 2012-02-11 DIAGNOSIS — R040 Epistaxis: Secondary | ICD-10-CM | POA: Diagnosis not present

## 2012-02-11 DIAGNOSIS — Z79899 Other long term (current) drug therapy: Secondary | ICD-10-CM | POA: Diagnosis not present

## 2012-02-11 DIAGNOSIS — I10 Essential (primary) hypertension: Secondary | ICD-10-CM

## 2012-02-11 DIAGNOSIS — R6889 Other general symptoms and signs: Secondary | ICD-10-CM | POA: Diagnosis not present

## 2012-02-11 LAB — COMPREHENSIVE METABOLIC PANEL
ALT: 22 U/L (ref 0–53)
AST: 24 U/L (ref 0–37)
Albumin: 3.8 g/dL (ref 3.5–5.2)
Alkaline Phosphatase: 65 U/L (ref 39–117)
BUN: 16 mg/dL (ref 6–23)
CO2: 27 mEq/L (ref 19–32)
Calcium: 8.7 mg/dL (ref 8.4–10.5)
Chloride: 103 mEq/L (ref 96–112)
Creatinine, Ser: 1.28 mg/dL (ref 0.50–1.35)
GFR calc Af Amer: 82 mL/min — ABNORMAL LOW (ref >90)
GFR calc non Af Amer: 71 mL/min — ABNORMAL LOW (ref >90)
Glucose, Bld: 111 mg/dL — ABNORMAL HIGH (ref 70–99)
Potassium: 3.5 mEq/L (ref 3.5–5.1)
Sodium: 138 mEq/L (ref 135–145)
Total Bilirubin: 0.3 mg/dL (ref 0.3–1.2)
Total Protein: 6.9 g/dL (ref 6.0–8.3)

## 2012-02-11 LAB — CBC
HCT: 39.2 % (ref 39.0–52.0)
Hemoglobin: 13.6 g/dL (ref 13.0–17.0)
MCH: 28.6 pg (ref 26.0–34.0)
MCHC: 34.7 g/dL (ref 30.0–36.0)
MCV: 82.4 fL (ref 78.0–100.0)
Platelets: 150 10*3/uL (ref 150–400)
RBC: 4.76 MIL/uL (ref 4.22–5.81)
RDW: 13 % (ref 11.5–15.5)
WBC: 3.4 10*3/uL — ABNORMAL LOW (ref 4.0–10.5)

## 2012-02-11 LAB — DIFFERENTIAL
Basophils Absolute: 0 10*3/uL (ref 0.0–0.1)
Basophils Relative: 1 % (ref 0–1)
Eosinophils Absolute: 0.1 10*3/uL (ref 0.0–0.7)
Eosinophils Relative: 3 % (ref 0–5)
Lymphocytes Relative: 47 % — ABNORMAL HIGH (ref 12–46)
Lymphs Abs: 1.6 10*3/uL (ref 0.7–4.0)
Monocytes Absolute: 0.1 10*3/uL (ref 0.1–1.0)
Monocytes Relative: 4 % (ref 3–12)
Neutro Abs: 1.5 10*3/uL — ABNORMAL LOW (ref 1.7–7.7)
Neutrophils Relative %: 45 % (ref 43–77)

## 2012-02-11 LAB — PROTIME-INR
INR: 1.1 (ref 0.00–1.49)
Prothrombin Time: 14.4 seconds (ref 11.6–15.2)

## 2012-02-11 MED ORDER — HYDROXYZINE HCL 25 MG PO TABS
25.0000 mg | ORAL_TABLET | Freq: Once | ORAL | Status: AC
  Administered 2012-02-11: 25 mg via ORAL
  Filled 2012-02-11: qty 1

## 2012-02-11 MED ORDER — OXYMETAZOLINE HCL 0.05 % NA SOLN
1.0000 | Freq: Once | NASAL | Status: AC
  Administered 2012-02-11: 1 via NASAL

## 2012-02-11 MED ORDER — LABETALOL HCL 5 MG/ML IV SOLN
20.0000 mg | Freq: Once | INTRAVENOUS | Status: AC
  Administered 2012-02-11: 20 mg via INTRAVENOUS
  Filled 2012-02-11: qty 4

## 2012-02-11 MED ORDER — OXYMETAZOLINE HCL 0.05 % NA SOLN: 1.0000 | Freq: Once | NASAL | Status: DC

## 2012-02-11 MED ORDER — HYDRALAZINE HCL 25 MG PO TABS: 25.0000 mg | ORAL_TABLET | Freq: Three times a day (TID) | ORAL | Status: DC

## 2012-02-11 MED ORDER — OXYMETAZOLINE HCL 0.05 % NA SOLN
NASAL | Status: AC
  Filled 2012-02-11: qty 15

## 2012-02-11 MED ORDER — LABETALOL HCL 5 MG/ML IV SOLN: 20.0000 mg | Freq: Once | INTRAVENOUS | Status: DC

## 2012-02-11 NOTE — Discharge Instructions (Signed)
Hypertension Information As your heart beats, it forces blood through your arteries. This force is your blood pressure. If the pressure is too high, it is called hypertension (HTN) or high blood pressure. HTN is dangerous because you may have it and not know it. High blood pressure may mean that your heart has to work harder to pump blood. Your arteries may be narrow or stiff. The extra work puts you at risk for heart disease, stroke, and other problems.  Blood pressure consists of two numbers, a higher number over a lower, 110/72, for example. It is stated as "110 over 72." The ideal is below 120 for the top number (systolic) and under 80 for the bottom (diastolic).  You should pay close attention to your blood pressure if you have certain conditions such as:  Heart failure.   Prior heart attack.   Diabetes   Chronic kidney disease.   Prior stroke.   Multiple risk factors for heart disease.  To see if you have HTN, your blood pressure should be measured while you are seated with your arm held at the level of the heart. It should be measured at least twice. A one-time elevated blood pressure reading (especially in the Emergency Department) does not mean that you need treatment. There may be conditions in which the blood pressure is different between your right and left arms. It is important to see your caregiver soon for a recheck. Most people have essential hypertension which means that there is not a specific cause. This type of high blood pressure may be lowered by changing lifestyle factors such as:  Stress.   Smoking.   Lack of exercise.   Excessive weight.   Drug/tobacco/alcohol use.   Eating less salt.  Most people do not have symptoms from high blood pressure until it has caused damage to the body. Effective treatment can often prevent, delay or reduce that damage. TREATMENT  Treatment for high blood pressure, when a cause has been identified, is directed at the cause. There  are a large number of medications to treat HTN. These fall into several categories, and your caregiver will help you select the medicines that are best for you. Medications may have side effects. You should review side effects with your caregiver. If your blood pressure stays high after you have made lifestyle changes or started on medicines,   Your medication(s) may need to be changed.   Other problems may need to be addressed.   Be certain you understand your prescriptions, and know how and when to take your medicine.   Be sure to follow up with your caregiver within the time frame advised (usually within two weeks) to have your blood pressure rechecked and to review your medications.   If you are taking more than one medicine to lower your blood pressure, make sure you know how and at what times they should be taken. Taking two medicines at the same time can result in blood pressure that is too low.  Document Released: 01/08/2006 Document Revised: 07/18/2011 Document Reviewed: 01/15/2008 [facility] Patient Information 2012 [place], [place]. A nose bleed was controlled in the emergency room a blood pressure at work down to a more normal range 149/98.  You have been prescribed an additional antihypertensive medication.  Please take as directed.  I would also like you to please call Dr. Ezzard Standing today and arrange for a followup appointment for your nose bleed.  Also, please make an appointment with your primary care provider

## 2012-02-11 NOTE — ED Provider Notes (Signed)
Medical screening examination/treatment/procedure(s) were conducted as a shared visit with non-physician practitioner(s) and myself.  I personally evaluated the patient during the encounter  Please see my separate respective documentation pertaining to this patient encounter   Vida Roller, MD 02/11/12 [phone]

## 2012-02-11 NOTE — ED Notes (Signed)
EAV:[id number]<JX> Expected date:02/11/12<BR> Expected time: 1:48 AM<BR> Means of arrival:Ambulance<BR> Comments:<BR> Epistaxis, hx of HTN

## 2012-02-11 NOTE — ED Notes (Signed)
Patient coughed and nose began to start bleeding again. Pressure reapplied to bridge of nose at this time.

## 2012-02-11 NOTE — ED Notes (Signed)
Patient brought in from home by EMS with c/o nose-bleed since late yesterday evening. Reports laying down for bed last night and awaking to his nose bleeding. Per EMS, patient hypertensive upon arrival. 18G R AC. Nose is actively bleeding. Pressure held to nose by RN at this time. Dondra Spry, NP at bedside.

## 2012-02-11 NOTE — ED Notes (Signed)
Pt with acute onset of recurrentl R sided anterior epistaxis that occurred this evening.  Has been seen twice this week for same and has recurrent bleeding.    Physical:  Minimal anterior epistaxis after patient evacuated nostril and held pressure and after Afrin. Oropharynx with minimal blood in the posterior pharynx, mucous membranes moist, speaks in full sentences without change in phonation, abdomen soft, lungs clear, heart regular without tachycardia, no signs of anemia  Assessment:  Recurrent epistaxis, seems to have stopped time being, will need ENT followup.  Should be stable for discharge  Medical screening examination/treatment/procedure(s) were conducted as a shared visit with non-physician practitioner(s) and myself.  I personally evaluated the patient during the encounter   Vida Roller, MD 02/11/12 [phone]

## 2012-02-11 NOTE — ED Notes (Signed)
Bleeding from nose has stopped. Patient remains hypertensive.

## 2012-02-11 NOTE — ED Notes (Signed)
Bleeding from nose controlled at this time. Ice pack placed to patient's nose.

## 2012-02-11 NOTE — ED Provider Notes (Signed)
History     CSN: [id number]  Arrival date & time 02/11/12  0203   First MD Initiated Contact with Patient 02/11/12 [phone]      Chief Complaint  Patient presents with  . Epistaxis    (Consider location/radiation/quality/duration/timing/severity/associated sxs/prior treatment) HPI Comments: This is third nosebleed in 4 days for this patient with known hypertension.  This left knee or bleed started approximately 30 minutes before arrival in the emergency department.  He is held pressure for approximately 10 minutes with slowing of the bleeding, but not complete resolution.  He states he has been taking his antihypertensive, which is Altace  Patient is a 36 y.o. male presenting with nosebleeds. The history is provided by the patient.  Epistaxis  This is a recurrent problem. The current episode started less than 1 hour ago. The problem occurs constantly. The problem has not changed since onset.The problem is associated with an unknown factor. The bleeding has been from the left nare. He has tried applying pressure for the symptoms.    Past Medical History  Diagnosis Date  . Hypertension   . Diabetes mellitus     Past Surgical History  Procedure Date  . Back surgery   . Back fusion     l4,l5  . Ankle surgery     both     History reviewed. No pertinent family history.  History  Substance Use Topics  . Smoking status: Never Smoker   . Smokeless tobacco: Not on file  . Alcohol Use: No      Review of Systems  Constitutional: Negative for fever.  HENT: Positive for nosebleeds. Negative for congestion and rhinorrhea.   Neurological: Negative for dizziness, weakness and headaches.    Allergies  Review of patient's allergies indicates no known allergies.  Home Medications   Current Outpatient Rx  Name Route Sig Dispense Refill  . RAMIPRIL 10 MG PO TABS Oral Take 10 mg by mouth 2 (two) times daily.    Marland Kitchen HYDRALAZINE HCL 25 MG PO TABS Oral Take 1 tablet (25 mg total) by  mouth 3 (three) times daily. 30 tablet 1    BP 146/98  Pulse 82  Temp(Src) 98.1 F (36.7 C) (Oral)  Resp 18  SpO2 100%  Physical Exam  Constitutional: He is oriented to person, place, and time. He appears well-developed and well-nourished.  HENT:  Head: Normocephalic.  Nose: Epistaxis is observed.  Eyes: Pupils are equal, round, and reactive to light.  Neck: Normal range of motion.  Pulmonary/Chest: Effort normal.  Abdominal: Soft.  Genitourinary: Penis normal.  Neurological: He is alert and oriented to person, place, and time.  Skin: Skin is warm.    ED Course  Procedures (including critical care time)  Labs Reviewed  CBC - Abnormal; Notable for the following:    WBC 3.4 (*)    All other components within normal limits  DIFFERENTIAL - Abnormal; Notable for the following:    Neutro Abs 1.5 (*)    Lymphocytes Relative 47 (*)    All other components within normal limits  COMPREHENSIVE METABOLIC PANEL - Abnormal; Notable for the following:    Glucose, Bld 111 (*)    GFR calc non Af Amer 71 (*)    GFR calc Af Amer 82 (*)    All other components within normal limits  PROTIME-INR   No results found.   1. Epistaxis   2. Hypertension    Pressure was held on thestaff for 10 minutes with complete resolution of the  bleeding.  He coughed or sneezing, and started bleeding again, Afrin nasal spray was applied to both nares 2 sprays again with resolution.  He also was given 20 mg of labetalol to lower his blood pressure did stabilize at 149/104, which is still a little high, but there is new, longer any bleeding.  Exam of his near he has some anterior bruising in 2 spots.  Lidocaine with epinephrine was applied to cotton ball and placed in the nostril for 20 minutes.  Repeat exam shows that there is no longer any oozing.  Patient will be discharged home with an additional antihypertensive hydralazine 25 mg daily, and hopefully, this will control his blood pressure and will no longer  have any epistaxis   MDM  Left knee, or epistaxis        Arman Filter, NP 02/11/12 [phone]

## 2012-02-11 NOTE — ED Notes (Signed)
Patient brought in from home by EMS c/o active nose-bleed since late last night. Patient has had 3 other nosebleeds with ED visits since last week Thursday. Airway intact; respirations even and unlabored. Maintaining secretions at this time. Suction setup at bedside. RN holding pressure to bridge of nose.

## 2012-02-12 ENCOUNTER — Emergency Department: Payer: Self-pay | Admitting: *Deleted

## 2012-02-12 DIAGNOSIS — R040 Epistaxis: Secondary | ICD-10-CM | POA: Diagnosis not present

## 2012-02-12 DIAGNOSIS — I10 Essential (primary) hypertension: Secondary | ICD-10-CM | POA: Diagnosis not present

## 2012-02-13 DIAGNOSIS — R040 Epistaxis: Secondary | ICD-10-CM | POA: Diagnosis not present

## 2012-02-13 LAB — CBC
HCT: 37.6 % — ABNORMAL LOW (ref 40.0–52.0)
HGB: 12.6 g/dL — ABNORMAL LOW (ref 13.0–18.0)
MCH: 29 pg (ref 26.0–34.0)
MCHC: 33.5 g/dL (ref 32.0–36.0)
MCV: 87 fL (ref 80–100)
Platelet: 162 10*3/uL (ref 150–440)
RBC: 4.34 10*6/uL — ABNORMAL LOW (ref 4.40–5.90)
RDW: 14.2 % (ref 11.5–14.5)
WBC: 5.7 10*3/uL (ref 3.8–10.6)

## 2012-02-13 LAB — COMPREHENSIVE METABOLIC PANEL
Albumin: 3.9 g/dL (ref 3.4–5.0)
Alkaline Phosphatase: 64 U/L (ref 50–136)
Anion Gap: 11 (ref 7–16)
BUN: 22 mg/dL — ABNORMAL HIGH (ref 7–18)
Bilirubin,Total: 0.4 mg/dL (ref 0.2–1.0)
Calcium, Total: 8.3 mg/dL — ABNORMAL LOW (ref 8.5–10.1)
Chloride: 107 mmol/L (ref 98–107)
Co2: 25 mmol/L (ref 21–32)
Creatinine: 1.05 mg/dL (ref 0.60–1.30)
EGFR (African American): >60
EGFR (Non-African Amer.): >60
Glucose: 141 mg/dL — ABNORMAL HIGH (ref 65–99)
Osmolality: 291 (ref 275–301)
Potassium: 3.7 mmol/L (ref 3.5–5.1)
SGOT(AST): 22 U/L (ref 15–37)
SGPT (ALT): 35 U/L
Sodium: 143 mmol/L (ref 136–145)
Total Protein: 7.1 g/dL (ref 6.4–8.2)

## 2012-02-13 LAB — APTT: Activated PTT: 27.6 secs (ref 23.6–35.9)

## 2012-02-13 LAB — PROTIME-INR
INR: 1
Prothrombin Time: 13.7 secs (ref 11.5–14.7)

## 2012-02-14 DIAGNOSIS — R040 Epistaxis: Secondary | ICD-10-CM | POA: Diagnosis not present

## 2012-02-15 ENCOUNTER — Encounter (HOSPITAL_COMMUNITY): Payer: Self-pay | Admitting: *Deleted

## 2012-02-15 ENCOUNTER — Emergency Department (HOSPITAL_COMMUNITY): Payer: Medicare Other

## 2012-02-15 ENCOUNTER — Encounter (HOSPITAL_COMMUNITY): Payer: Self-pay | Admitting: Anesthesiology

## 2012-02-15 ENCOUNTER — Emergency Department (HOSPITAL_COMMUNITY): Payer: Medicare Other | Admitting: Anesthesiology

## 2012-02-15 ENCOUNTER — Inpatient Hospital Stay (HOSPITAL_COMMUNITY)
Admission: EM | Admit: 2012-02-15 | Discharge: 2012-02-16 | DRG: 983 | Disposition: A | Discharge status: Home / Self Care | Payer: Medicare Other | Attending: Interventional Radiology | Admitting: Interventional Radiology

## 2012-02-15 DIAGNOSIS — R040 Epistaxis: Principal | ICD-10-CM | POA: Diagnosis present

## 2012-02-15 DIAGNOSIS — Z981 Arthrodesis status: Secondary | ICD-10-CM | POA: Diagnosis not present

## 2012-02-15 DIAGNOSIS — I10 Essential (primary) hypertension: Secondary | ICD-10-CM | POA: Diagnosis not present

## 2012-02-15 DIAGNOSIS — E119 Type 2 diabetes mellitus without complications: Secondary | ICD-10-CM | POA: Diagnosis present

## 2012-02-15 LAB — CBC
HCT: 34.7 % — ABNORMAL LOW (ref 39.0–52.0)
Hemoglobin: 12 g/dL — ABNORMAL LOW (ref 13.0–17.0)
MCH: 28.9 pg (ref 26.0–34.0)
MCHC: 34.6 g/dL (ref 30.0–36.0)
MCV: 83.6 fL (ref 78.0–100.0)
Platelets: 146 10*3/uL — ABNORMAL LOW (ref 150–400)
RBC: 4.15 MIL/uL — ABNORMAL LOW (ref 4.22–5.81)
RDW: 13.7 % (ref 11.5–15.5)
WBC: 6.7 10*3/uL (ref 4.0–10.5)

## 2012-02-15 LAB — MRSA PCR SCREENING: MRSA by PCR: NEGATIVE

## 2012-02-15 LAB — BASIC METABOLIC PANEL
BUN: 14 mg/dL (ref 6–23)
CO2: 29 mEq/L (ref 19–32)
Calcium: 9.1 mg/dL (ref 8.4–10.5)
Chloride: 102 mEq/L (ref 96–112)
Creatinine, Ser: 1.19 mg/dL (ref 0.50–1.35)
GFR calc Af Amer: 89 mL/min — ABNORMAL LOW (ref >90)
GFR calc non Af Amer: 77 mL/min — ABNORMAL LOW (ref >90)
Glucose, Bld: 176 mg/dL — ABNORMAL HIGH (ref 70–99)
Potassium: 3.7 mEq/L (ref 3.5–5.1)
Sodium: 139 mEq/L (ref 135–145)

## 2012-02-15 LAB — GLUCOSE, CAPILLARY
Glucose-Capillary: 165 mg/dL — ABNORMAL HIGH (ref 70–99)
Glucose-Capillary: 93 mg/dL (ref 70–99)
Glucose-Capillary: 125 mg/dL — ABNORMAL HIGH (ref 70–99)

## 2012-02-15 MED ORDER — HEPARIN SODIUM (PORCINE) 1000 UNIT/ML IJ SOLN
INTRAMUSCULAR | Status: DC | PRN
  Administered 2012-02-15: 1000 [IU] via INTRAVENOUS
  Administered 2012-02-15: 4000 [IU] via INTRAVENOUS
  Administered 2012-02-15: 1000 [IU] via INTRAVENOUS

## 2012-02-15 MED ORDER — ONDANSETRON HCL 4 MG/2ML IJ SOLN
INTRAMUSCULAR | Status: DC | PRN
  Administered 2012-02-15: 4 mg via INTRAVENOUS

## 2012-02-15 MED ORDER — SODIUM CHLORIDE 0.9 % IJ SOLN
1.5000 mg | INTRAVENOUS | Status: AC
  Filled 2012-02-15: qty 0.3

## 2012-02-15 MED ORDER — INSULIN ASPART 100 UNIT/ML ~~LOC~~ SOLN: 0.0000 [IU] | Freq: Three times a day (TID) | SUBCUTANEOUS | Status: DC

## 2012-02-15 MED ORDER — NICARDIPINE HCL IN NACL 20-0.86 MG/200ML-% IV SOLN
5.0000 mg/h | INTRAVENOUS | Status: DC
  Administered 2012-02-15: 15 mg/h via INTRAVENOUS
  Administered 2012-02-15: 5 mg/h via INTRAVENOUS
  Filled 2012-02-15 (×4): qty 200

## 2012-02-15 MED ORDER — NEOSTIGMINE METHYLSULFATE 1 MG/ML IJ SOLN
INTRAMUSCULAR | Status: DC | PRN
  Administered 2012-02-15: 5 mg via INTRAVENOUS

## 2012-02-15 MED ORDER — HYDROCODONE-ACETAMINOPHEN 5-325 MG PO TABS
1.0000 | ORAL_TABLET | Freq: Once | ORAL | Status: AC
  Administered 2012-02-15: 1 via ORAL
  Filled 2012-02-15: qty 1

## 2012-02-15 MED ORDER — ACETAMINOPHEN 500 MG PO TABS
1000.0000 mg | ORAL_TABLET | Freq: Four times a day (QID) | ORAL | Status: DC | PRN
  Administered 2012-02-15: 1000 mg via ORAL

## 2012-02-15 MED ORDER — SODIUM CHLORIDE 0.9 % IV SOLN: INTRAVENOUS | Status: AC

## 2012-02-15 MED ORDER — SALINE SPRAY 0.65 % NA SOLN
1.0000 | NASAL | Status: DC
  Administered 2012-02-15 – 2012-02-16 (×8): 1 via NASAL
  Filled 2012-02-15: qty 44

## 2012-02-15 MED ORDER — ONDANSETRON HCL 4 MG/2ML IJ SOLN: 4.0000 mg | Freq: Four times a day (QID) | INTRAMUSCULAR | Status: DC | PRN

## 2012-02-15 MED ORDER — LABETALOL HCL 5 MG/ML IV SOLN
INTRAVENOUS | Status: DC | PRN
  Administered 2012-02-15: 20 mg via INTRAVENOUS
  Administered 2012-02-15: 10 mg via INTRAVENOUS

## 2012-02-15 MED ORDER — FENTANYL CITRATE 0.05 MG/ML IJ SOLN
50.0000 ug | Freq: Once | INTRAMUSCULAR | Status: AC
  Administered 2012-02-15: 50 ug via INTRAVENOUS

## 2012-02-15 MED ORDER — IOHEXOL 300 MG/ML  SOLN: 450.0000 mL | Freq: Once | INTRAMUSCULAR | Status: AC | PRN

## 2012-02-15 MED ORDER — FENTANYL CITRATE 0.05 MG/ML IJ SOLN
INTRAMUSCULAR | Status: DC | PRN
  Administered 2012-02-15 (×2): 50 ug via INTRAVENOUS
  Administered 2012-02-15: 150 ug via INTRAVENOUS

## 2012-02-15 MED ORDER — ACETAMINOPHEN 500 MG PO TABS
ORAL_TABLET | ORAL | Status: AC
  Filled 2012-02-15: qty 2

## 2012-02-15 MED ORDER — GLYCOPYRROLATE 0.2 MG/ML IJ SOLN
INTRAMUSCULAR | Status: DC | PRN
  Administered 2012-02-15: .7 mg via INTRAVENOUS

## 2012-02-15 MED ORDER — LIDOCAINE HCL (CARDIAC) 20 MG/ML IV SOLN
INTRAVENOUS | Status: DC | PRN
  Administered 2012-02-15: 40 mg via INTRAVENOUS

## 2012-02-15 MED ORDER — LACTATED RINGERS IV SOLN
INTRAVENOUS | Status: DC | PRN
  Administered 2012-02-15 (×2): via INTRAVENOUS

## 2012-02-15 MED ORDER — ROCURONIUM BROMIDE 100 MG/10ML IV SOLN
INTRAVENOUS | Status: DC | PRN
  Administered 2012-02-15: 10 mg via INTRAVENOUS
  Administered 2012-02-15: 30 mg via INTRAVENOUS
  Administered 2012-02-15: 10 mg via INTRAVENOUS
  Administered 2012-02-15: 20 mg via INTRAVENOUS
  Administered 2012-02-15 (×3): 10 mg via INTRAVENOUS

## 2012-02-15 MED ORDER — ACETAMINOPHEN 650 MG RE SUPP: 650.0000 mg | Freq: Four times a day (QID) | RECTAL | Status: DC | PRN

## 2012-02-15 MED ORDER — SUCCINYLCHOLINE CHLORIDE 20 MG/ML IJ SOLN
INTRAMUSCULAR | Status: DC | PRN
  Administered 2012-02-15: 100 mg via INTRAVENOUS

## 2012-02-15 MED ORDER — PROPOFOL 10 MG/ML IV EMUL
INTRAVENOUS | Status: DC | PRN
  Administered 2012-02-15: 200 mg via INTRAVENOUS

## 2012-02-15 MED ORDER — CEFAZOLIN SODIUM 1-5 GM-% IV SOLN
INTRAVENOUS | Status: DC | PRN
  Administered 2012-02-15: 1 g via INTRAVENOUS

## 2012-02-15 NOTE — ED Notes (Signed)
To IR for procedure.

## 2012-02-15 NOTE — ED Notes (Signed)
Checked patient blood sugar it was 165 notified RN [employer]

## 2012-02-15 NOTE — Anesthesia Preprocedure Evaluation (Addendum)
Anesthesia Evaluation  Patient identified by MRN, date of birth, ID band Patient awake    Reviewed: Allergy & Precautions, H&P , NPO status , Patient's Chart, lab work & pertinent test results  History of Anesthesia Complications (+) DIFFICULT AIRWAY  Airway Mallampati: II TM Distance: >3 FB Neck ROM: Full    Dental  (+) Teeth Intact and Dental Advisory Given   Pulmonary neg pulmonary ROS,    Pulmonary exam normal       Cardiovascular hypertension, On Medications Rhythm:regular Rate:Normal     Neuro/Psych negative neurological ROS     GI/Hepatic negative GI ROS, Neg liver ROS,   Endo/Other  Diabetes mellitus-, Well Controlled, Type 2  Renal/GU negative Renal ROS     Musculoskeletal   Abdominal Normal abdominal exam  (+)  Abdomen: soft.    Peds  Hematology  (+) Sickle cell trait ,   Anesthesia Other Findings   Reproductive/Obstetrics negative OB ROS                          Anesthesia Physical Anesthesia Plan  ASA: II  Anesthesia Plan: General   Post-op Pain Management:    Induction: Intravenous, Rapid sequence and Cricoid pressure planned  Airway Management Planned: Oral ETT  Additional Equipment:   Intra-op Plan:   Post-operative Plan: Extubation in OR  Informed Consent: I have reviewed the patients History and Physical, chart, labs and discussed the procedure including the risks, benefits and alternatives for the proposed anesthesia with the patient or authorized representative who has indicated his/her understanding and acceptance.   Dental advisory given  Plan Discussed with: CRNA, Anesthesiologist and Surgeon  Anesthesia Plan Comments:        Anesthesia Quick Evaluation

## 2012-02-15 NOTE — ED Provider Notes (Signed)
History     CSN: [id number]  Arrival date & time 02/15/12  7829   First MD Initiated Contact with Patient 02/15/12 [id number]      Chief Complaint  Patient presents with  . Epistaxis    (Consider location/radiation/quality/duration/timing/severity/associated sxs/prior treatment) HPI The patient is a 36 yo man, with a 1-week history of recurrent nosebleeds, HTN, and DM, presenting with recurrent nosebleeds.  The patient has been having nosebleeds daily for the last week, with multiple ED visits for this complaint.  The patient was seen in our ED on 3/23, and treated with a rhino rocket, then again on 3/25, treated with pressure, afrin, and lidocaine + epi.  The patient notes he presented to the surgical center of [facility] to undergo a procedure yesterday (?cauterization), but they did not identify any bleeding vessels at that time, and simply cauterized the vessels they suspected might bleed.  The patient has also had poorly controlled HTN at each of these visits, with BP 3/23 of 204/137, BP 3/25 of 167/110, and BP today 169/99, with additional antihypertensives added at each visit, with his current regimen consisting of ramipril 10 daily, hydralazine 25 TID, and clonidine 0.1 daily.  Today, the patient notes recurrent bleed starting 4 hours prior to presentation, with "pints" of blood draining through his nares and mouth.  He also notes a new left-sided, dull headache, which started this morning, partially relieved by hydrocodone.   Past Medical History  Diagnosis Date  . Hypertension   . Diabetes mellitus     Past Surgical History  Procedure Date  . Back surgery   . Back fusion     l4,l5  . Ankle surgery     both     No family history on file.  History  Substance Use Topics  . Smoking status: Never Smoker   . Smokeless tobacco: Not on file  . Alcohol Use: No      Review of Systems General: no fevers, chills, changes in weight, changes in appetite Skin: no rash HEENT: no  blurry vision, hearing changes, sore throat Pulm: no dyspnea, coughing, wheezing CV: no chest pain, palpitations, shortness of breath Abd: no abdominal pain, nausea/vomiting, diarrhea/constipation GU: no dysuria, hematuria, polyuria Ext: no arthralgias, myalgias Neuro: no weakness, numbness, or tingling  Allergies  Review of patient's allergies indicates no known allergies.  Home Medications   Current Outpatient Rx  Name Route Sig Dispense Refill  . CEPHALEXIN 500 MG PO CAPS Oral Take 500 mg by mouth 2 (two) times daily.    Marland Kitchen CLONIDINE HCL 0.1 MG PO TABS Oral Take 0.1 mg by mouth daily.    Marland Kitchen HYDRALAZINE HCL 25 MG PO TABS Oral Take 1 tablet (25 mg total) by mouth 3 (three) times daily. 30 tablet 1  . HYDROCODONE-ACETAMINOPHEN 5-325 MG PO TABS Oral Take 1 tablet by mouth every 6 (six) hours as needed. For pain    . OXYMETAZOLINE HCL 0.05 % NA SOLN Nasal Place 2 sprays into the nose 2 (two) times daily as needed. For nasal congestion    . RAMIPRIL 5 MG PO CAPS Oral Take 5 mg by mouth 2 (two) times daily.      BP 169/99  Temp(Src) 98.2 F (36.8 C) (Oral)  Resp 16  SpO2 97%  Physical Exam General: alert, cooperative, appears upset HEENT: Crusted blood noted on bilateral nares L > R and in oropharynx.  No active source of bleeding identified. pupils equal round and reactive to light, vision grossly intact, oropharynx  clear and non-erythematous  Neck: supple, no lymphadenopathy Lungs: clear to ascultation bilaterally, normal work of respiration, no wheezes, rales, ronchi Heart: regular rate and rhythm, no murmurs, gallops, or rubs Abdomen: soft, non-tender, non-distended, normal bowel sounds Extremities: no cyanosis, clubbing, or edema Neurologic: alert & oriented X3, cranial nerves II-XII intact, strength grossly intact, sensation intact to light touch  ED Course  Procedures (including critical care time)  Labs Reviewed  GLUCOSE, CAPILLARY - Abnormal; Notable for the following:     Glucose-Capillary 165 (*)    All other components within normal limits  CBC  BASIC METABOLIC PANEL   No results found.   No diagnosis found.  The patient is a 36 yo man, presenting for recurrent epistaxis.  # Epistaxis - 1-week history of recurrent epistaxis, s/p cauterization by Dr. Ezzard Standing (ENT) yesterday. -check cbc -consider rhino rocket vs return to Dr. Ezzard Standing today   12:00 pm - spoke with ENT; patient's left nostril still bleeding around packing, and still bleeding once packing removed.  Sent to IR for angiography and intervention.  1:00 pm - transferred to CDU  MDM    Linward Headland, MD 02/15/12 1346

## 2012-02-15 NOTE — Procedures (Signed)
S/P Bilateral CCA angiograms and selective ECA angiograms followed by superselective  embolisation of sphenopalatine ,infrorbital and septal branches of facial arteries ,bilatearally  Using PVA particles.,under GA

## 2012-02-15 NOTE — ED Notes (Signed)
MD at bedside. 

## 2012-02-15 NOTE — Progress Notes (Signed)
Met with patient and his wife to discuss primary care providers. Patient used to go to [facility] but does not want to continue there. I provided them with the provider resource list as well as the number for [facility]. I encouraged to call ASAP so he can schedule follow-up appts. And obtain medical maintenance. They expressed their appreciation.

## 2012-02-15 NOTE — H&P (Signed)
[name] is an 36 y.o. male.   Chief Complaint: recurrent epistaxis Pt has had nasal packing- failed; Dr Ezzard Standing surgically cauterized vessels 3/28- failed. scheduled now for cerebral arteriogram with possible embolization HPI: Epistaxis; htn; dm  Past Medical History  Diagnosis Date  . Hypertension   . Diabetes mellitus     Past Surgical History  Procedure Date  . Back surgery   . Back fusion     l4,l5  . Ankle surgery     both     No family history on file. Social History:  reports that he has never smoked. He does not have any smokeless tobacco history on file. He reports that he does not drink alcohol or use illicit drugs.  Allergies: No Known Allergies  Medications Prior to Admission  Medication Dose Route Frequency Provider Last Rate Last Dose  . HYDROcodone-acetaminophen (NORCO) 5-325 MG per tablet 1 tablet  1 tablet Oral Once Linward Headland, MD   1 tablet at 02/15/12 1610   Medications Prior to Admission  Medication Sig Dispense Refill  . cloNIDine (CATAPRES) 0.1 MG tablet Take 0.1 mg by mouth daily.      . hydrALAZINE (APRESOLINE) 25 MG tablet Take 1 tablet (25 mg total) by mouth 3 (three) times daily.  30 tablet  1  . DISCONTD: ramipril (ALTACE) 10 MG tablet Take 10 mg by mouth 2 (two) times daily.        Results for orders placed during the hospital encounter of 02/15/12 (from the past 48 hour(s))  GLUCOSE, CAPILLARY     Status: Abnormal   Collection Time   02/15/12  8:05 AM      Component Value Range Comment   Glucose-Capillary 165 (*) 70 - 99 (mg/dL)   CBC     Status: Abnormal   Collection Time   02/15/12  8:17 AM      Component Value Range Comment   WBC 6.7  4.0 - 10.5 (K/uL)    RBC 4.15 (*) 4.22 - 5.81 (MIL/uL)    Hemoglobin 12.0 (*) 13.0 - 17.0 (g/dL)    HCT 96.0 (*) 45.4 - 52.0 (%)    MCV 83.6  78.0 - 100.0 (fL)    MCH 28.9  26.0 - 34.0 (pg)    MCHC 34.6  30.0 - 36.0 (g/dL)    RDW 09.8  11.9 - 14.7 (%)    Platelets 146 (*) 150 - 400 (K/uL)     BASIC METABOLIC PANEL     Status: Abnormal   Collection Time   02/15/12  8:17 AM      Component Value Range Comment   Sodium 139  135 - 145 (mEq/L)    Potassium 3.7  3.5 - 5.1 (mEq/L)    Chloride 102  96 - 112 (mEq/L)    CO2 29  19 - 32 (mEq/L)    Glucose, Bld 176 (*) 70 - 99 (mg/dL)    BUN 14  6 - 23 (mg/dL)    Creatinine, Ser 8.29  0.50 - 1.35 (mg/dL)    Calcium 9.1  8.4 - 10.5 (mg/dL)    GFR calc non Af Amer 77 (*) >90 (mL/min)    GFR calc Af Amer 89 (*) >90 (mL/min)    No results found.  Review of Systems  Constitutional: Negative for fever.  HENT: Positive for nosebleeds. Negative for neck pain.   Eyes: Negative for blurred vision.  Respiratory: Negative for cough.   Cardiovascular: Negative for chest pain.  Gastrointestinal: Positive  for nausea. Negative for vomiting.  Neurological: Positive for headaches.    Blood pressure 125/66, pulse 87, temperature 98.4 F (36.9 C), temperature source Oral, resp. rate 20, SpO2 97.00%. Physical Exam  Constitutional: He is oriented to person, place, and time. He appears well-developed and well-nourished.  HENT:  Head: Normocephalic.  Eyes: EOM are normal.  Neck: Normal range of motion.  Cardiovascular: Normal rate, regular rhythm and normal heart sounds.   No murmur heard. Respiratory: Effort normal and breath sounds normal. He has no wheezes.  GI: Soft. Bowel sounds are normal. There is no tenderness.  Musculoskeletal: Normal range of motion.  Neurological: He is alert and oriented to person, place, and time.  Skin: Skin is warm and dry.     Assessment/Plan Recurrent nosebleeds Failed attempts at treating epistaxis; packing and surgical cautery Now scheduled for cerebral arteriogram with possible embolization of External carotid artery branches Pt aware of procedure benefits and risks and agreeable to proceed. Consent signed.  Muneeb Veras A 02/15/2012, 11:44 AM

## 2012-02-15 NOTE — ED Notes (Signed)
Informed by staff in IR that pt will not be returning to CDU but will be admitted & taken straight to unit from IR after procedure

## 2012-02-15 NOTE — ED Provider Notes (Signed)
I saw and evaluated the patient, reviewed the resident's note and I agree with the findings and plan.  Recurrence left nasal epistaxis since Dr. Ezzard Standing performed procedure yesterday for one week intermittent epistaxis, hemodynamically stable, no dizziness, apparently had cautery and stent placement yesterday, stent is still in the left naris so I cannot determine the recurrent bleeding site now, ENT [phone]  D/w ENT rec remove stent and try ED pack.  Removed packing, Pt refused trial 7.5cm Rapid rhino, no bleed source found left naris after pack removal, Pt states failed 4 long packs, ENT had recommended IR if pack didn't work since unlikely to see source in ED since OR yesterday apparently did not find source, d/w IR and Pt to IR for angio.  Hurman Horn, MD 02/15/12 2234

## 2012-02-15 NOTE — ED Notes (Addendum)
Pt reports had surgery yesterday for recurrent nosebleeds. Unsure of what procedure was done. States nose began to bleed approximately 2 hours ago. Pt reports left sided face pain and spitting up blood. Not actively bleeding.

## 2012-02-15 NOTE — Transfer of Care (Signed)
Immediate Anesthesia Transfer of Care Note  Patient: [name]  Procedure(s) Performed: * No procedures listed *  Patient Location: PACU  Anesthesia Type: General  Level of Consciousness: awake, oriented and sedated  Airway & Oxygen Therapy: Patient Spontanous Breathing and Patient connected to face mask oxygen  Post-op Assessment: Report given to PACU RN, Post -op Vital signs reviewed and stable, Patient moving all extremities, Patient moving all extremities X 4 and Patient able to stick tongue midline  Post vital signs: Reviewed and stable  Complications: No apparent anesthesia complications

## 2012-02-15 NOTE — ED Notes (Signed)
Pt still out of the department at this time 

## 2012-02-15 NOTE — ED Notes (Signed)
3112-01 Ready 

## 2012-02-15 NOTE — ED Notes (Signed)
Pt's nose began to bleed again- moderate amount. MD made aware. Nosebleed tray placed at bedside.

## 2012-02-16 LAB — BASIC METABOLIC PANEL
BUN: 12 mg/dL (ref 6–23)
CO2: 28 mEq/L (ref 19–32)
Calcium: 8.5 mg/dL (ref 8.4–10.5)
Chloride: 103 mEq/L (ref 96–112)
Creatinine, Ser: 1.27 mg/dL (ref 0.50–1.35)
GFR calc Af Amer: 83 mL/min — ABNORMAL LOW (ref >90)
GFR calc non Af Amer: 71 mL/min — ABNORMAL LOW (ref >90)
Glucose, Bld: 116 mg/dL — ABNORMAL HIGH (ref 70–99)
Potassium: 3.7 mEq/L (ref 3.5–5.1)
Sodium: 139 mEq/L (ref 135–145)

## 2012-02-16 LAB — GLUCOSE, CAPILLARY
Glucose-Capillary: 130 mg/dL — ABNORMAL HIGH (ref 70–99)
Glucose-Capillary: 114 mg/dL — ABNORMAL HIGH (ref 70–99)

## 2012-02-16 LAB — CBC
HCT: 32.1 % — ABNORMAL LOW (ref 39.0–52.0)
Hemoglobin: 10.9 g/dL — ABNORMAL LOW (ref 13.0–17.0)
MCH: 28.5 pg (ref 26.0–34.0)
MCHC: 34 g/dL (ref 30.0–36.0)
MCV: 84 fL (ref 78.0–100.0)
Platelets: 158 10*3/uL (ref 150–400)
RBC: 3.82 MIL/uL — ABNORMAL LOW (ref 4.22–5.81)
RDW: 14.1 % (ref 11.5–15.5)
WBC: 7.1 10*3/uL (ref 4.0–10.5)

## 2012-02-16 LAB — DIFFERENTIAL
Basophils Absolute: 0 10*3/uL (ref 0.0–0.1)
Basophils Relative: 0 % (ref 0–1)
Eosinophils Absolute: 0 10*3/uL (ref 0.0–0.7)
Eosinophils Relative: 1 % (ref 0–5)
Lymphocytes Relative: 28 % (ref 12–46)
Lymphs Abs: 2 10*3/uL (ref 0.7–4.0)
Monocytes Absolute: 0.3 10*3/uL (ref 0.1–1.0)
Monocytes Relative: 5 % (ref 3–12)
Neutro Abs: 4.8 10*3/uL (ref 1.7–7.7)
Neutrophils Relative %: 67 % (ref 43–77)

## 2012-02-16 MED ORDER — KETOROLAC TROMETHAMINE 30 MG/ML IJ SOLN
30.0000 mg | Freq: Once | INTRAMUSCULAR | Status: AC
  Administered 2012-02-16: 30 mg via INTRAVENOUS

## 2012-02-16 MED ORDER — HYDROCODONE-ACETAMINOPHEN 5-325 MG PO TABS
1.0000 | ORAL_TABLET | Freq: Four times a day (QID) | ORAL | Status: DC | PRN
  Administered 2012-02-16: 2 via ORAL
  Filled 2012-02-16: qty 2

## 2012-02-16 MED ORDER — KETOROLAC TROMETHAMINE 30 MG/ML IJ SOLN
INTRAMUSCULAR | Status: AC
  Filled 2012-02-16: qty 1

## 2012-02-16 NOTE — Anesthesia Postprocedure Evaluation (Signed)
  Anesthesia Post-op Note  Patient: [name]  Procedure(s) Performed: * No procedures listed *  Patient Location: PACU  Anesthesia Type: General  Level of Consciousness: awake, oriented, sedated and patient cooperative  Airway and Oxygen Therapy: Patient Spontanous Breathing and Patient connected to nasal cannula oxygen  Post-op Pain: none  Post-op Assessment: Post-op Vital signs reviewed, Patient's Cardiovascular Status Stable, Respiratory Function Stable, Patent Airway, No signs of Nausea or vomiting and Pain level controlled  Post-op Vital Signs: stable  Complications: No apparent anesthesia complications

## 2012-02-16 NOTE — Progress Notes (Signed)
Reviewed with patient discharge instructions including to resume home meds, make follow-up appointment with ENT physician, and to notify MD of any nosebleeds.  All discharge instructions reviewed with patient and spouse.  All questions r/t discharge and plan of care answered.  PIV x 2 removed.  Pt dressed and transferred to front door per wheelchair for d/c to home.

## 2012-02-16 NOTE — Discharge Instructions (Signed)
Nosebleed  A nosebleed can be caused by many things, including:   Getting hit hard in the nose.   Infections.   Dry nose.   Colds.   Medicines.  Your doctor may do lab testing if you get nosebleeds a lot and the cause is not known.  HOME CARE    If your nose was packed with material, keep it there until your doctor takes it out. Put the pack back in your nose if the pack falls out.   Do not blow your nose for 12 hours after the nosebleed.   Sit up and bend forward if your nose starts bleeding again. Pinch the front half of your nose nonstop for 20 minutes.   Put petroleum jelly inside your nose every morning if you have a dry nose.   Use a humidifier to make the air less dry.   Do not take aspirin.   Try not to strain, lift, or bend at the waist for many days after the nosebleed.  GET HELP RIGHT AWAY IF:    Nosebleeds keep happening and are hard to stop or control.   You have bleeding or bruises that are not normal on other parts of the body.   You have a fever.   The nosebleeds get worse.   You get lightheaded, feel faint, sweaty, or throw up (vomit) blood.  MAKE SURE YOU:    Understand these instructions.   Will watch your condition.   Will get help right away if you are not doing well or get worse.  Document Released: 08/14/2008 Document Revised: 10/25/2011 Document Reviewed: 08/14/2008  Exit[employer] Patient Information 2012 [employer].

## 2012-02-16 NOTE — Progress Notes (Signed)
  Subjective  Post procedure . C/O 4/10 frontal headache ,relieved with tylenol. Taking PO liquids and semi solids . Denies any nausea,vomitting ,visual ,motor or sensory symptoms.. No further epistaxis .Marland Kitchen   Objective: Vital signs in last 24 hours: Temp:  [96.5 F (35.8 C)-100 F (37.8 C)] 98.5 F (36.9 C) (03/30 0800) Pulse Rate:  [59-101] 73  (03/30 0800) Resp:  [11-20] 16  (03/30 0800) BP: (111-175)/(66-113) 143/83 mmHg (03/30 0800) SpO2:  [95 %-100 %] 97 % (03/30 0800) Weight:  [238 lb 8.6 oz (108.2 kg)] 238 lb 8.6 oz (108.2 kg) (03/29 1925)    Intake/Output from previous day: 03/29 0701 - 03/30 0700 In: 2722.5 [I.V.:2722.5] Out: 3500 [Urine:3400; Blood:100] Intake/Output this shift:    On examination. In no acute distress. VS BP 140s /70s. HR 70s SR.PaO2 95% RA. No evidence of epistaxis... Neuro. Alert, awake oriented to time ,place and space.Marland Kitchen  Speech and comprehension clear.Marland Kitchen  PEARLA..3mm R=L  EOMS full.Jill [place].WNLs.  No Facial asymmetry.  Tongue Midline..  Motor..           NO drift of outstretched arms..          Power.5/5 Proximally and distally all four extremities..  Fine motor and coordination to finger to nose equal..  Gait Deffered  Romberg Deffered .  Heel to toe.Deferred.  RT Groin soft . No palpable hematoma.Pulses 1+  Lab Results:   Basename 02/16/12 0340 02/15/12 0817  WBC 7.1 6.7  HGB 10.9* 12.0*  HCT 32.1* 34.7*  PLT 158 146*   BMET  Basename 02/16/12 0340 02/15/12 0817  NA 139 139  K 3.7 3.7  CL 103 102  CO2 28 29  GLUCOSE 116* 176*  BUN 12 14  CREATININE 1.27 1.19  CALCIUM 8.5 9.1   PT/INR No results found for this basename: LABPROT:2,INR:2 in the last 72 hours ABG No results found for this basename: PHART:2,PCO2:2,PO2:2,HCO3:2 in the last 72 hours  Studies/Results: No results found.  Anti-infectives: Anti-infectives    None      Assessment/Plan: s/p  Bilateral ECA branch embolization  for recurrent epistaxis,despite cauterization.. PLan. Ambulate.with assistance. D/C foley. Advance diet . D/C IVs . If above tolerated D/C to home with spouse. Discussed with patient. F/U with patients ENT.  Saman Giddens K 02/16/2012

## 2012-02-18 ENCOUNTER — Telehealth (HOSPITAL_COMMUNITY): Payer: Self-pay

## 2012-02-18 LAB — POCT ACTIVATED CLOTTING TIME
Activated Clotting Time: 160 seconds
Activated Clotting Time: 160 seconds

## 2012-02-19 DIAGNOSIS — I10 Essential (primary) hypertension: Secondary | ICD-10-CM | POA: Diagnosis not present

## 2012-02-20 DIAGNOSIS — E782 Mixed hyperlipidemia: Secondary | ICD-10-CM | POA: Diagnosis not present

## 2012-02-20 DIAGNOSIS — E1165 Type 2 diabetes mellitus with hyperglycemia: Secondary | ICD-10-CM | POA: Diagnosis not present

## 2012-02-20 DIAGNOSIS — E118 Type 2 diabetes mellitus with unspecified complications: Secondary | ICD-10-CM | POA: Diagnosis not present

## 2012-02-20 DIAGNOSIS — IMO0002 Reserved for concepts with insufficient information to code with codable children: Secondary | ICD-10-CM | POA: Diagnosis not present

## 2012-02-20 DIAGNOSIS — I10 Essential (primary) hypertension: Secondary | ICD-10-CM | POA: Diagnosis not present

## 2012-02-22 ENCOUNTER — Other Ambulatory Visit (HOSPITAL_COMMUNITY): Payer: Self-pay | Admitting: Internal Medicine

## 2012-02-22 DIAGNOSIS — R040 Epistaxis: Secondary | ICD-10-CM

## 2012-02-25 DIAGNOSIS — E1129 Type 2 diabetes mellitus with other diabetic kidney complication: Secondary | ICD-10-CM | POA: Diagnosis not present

## 2012-02-25 DIAGNOSIS — E1142 Type 2 diabetes mellitus with diabetic polyneuropathy: Secondary | ICD-10-CM | POA: Diagnosis not present

## 2012-02-25 DIAGNOSIS — E1165 Type 2 diabetes mellitus with hyperglycemia: Secondary | ICD-10-CM | POA: Diagnosis not present

## 2012-02-25 DIAGNOSIS — E118 Type 2 diabetes mellitus with unspecified complications: Secondary | ICD-10-CM | POA: Diagnosis not present

## 2012-02-25 DIAGNOSIS — E782 Mixed hyperlipidemia: Secondary | ICD-10-CM | POA: Diagnosis not present

## 2012-02-25 DIAGNOSIS — E669 Obesity, unspecified: Secondary | ICD-10-CM | POA: Diagnosis not present

## 2012-02-25 DIAGNOSIS — IMO0002 Reserved for concepts with insufficient information to code with codable children: Secondary | ICD-10-CM | POA: Diagnosis not present

## 2012-02-27 DIAGNOSIS — I10 Essential (primary) hypertension: Secondary | ICD-10-CM | POA: Diagnosis not present

## 2012-03-04 DIAGNOSIS — T783XXA Angioneurotic edema, initial encounter: Secondary | ICD-10-CM | POA: Diagnosis not present

## 2012-03-04 DIAGNOSIS — I10 Essential (primary) hypertension: Secondary | ICD-10-CM | POA: Diagnosis not present

## 2012-03-11 DIAGNOSIS — R51 Headache: Secondary | ICD-10-CM | POA: Diagnosis not present

## 2012-03-11 DIAGNOSIS — I10 Essential (primary) hypertension: Secondary | ICD-10-CM | POA: Diagnosis not present

## 2012-03-14 DIAGNOSIS — R51 Headache: Secondary | ICD-10-CM | POA: Diagnosis not present

## 2012-03-14 DIAGNOSIS — J310 Chronic rhinitis: Secondary | ICD-10-CM | POA: Diagnosis not present

## 2012-05-09 NOTE — Discharge Summary (Signed)
Physician Discharge Summary  Patient ID: [name] MRN: [id number] DOB/AGE: 1976/03/24 36 y.o.  Admit date: 02/15/2012 Discharge date: 05/09/2012  Admission Diagnoses:. Recurrent epistaxis  Discharge Diagnoses:  Post embolization  For recurrent epistaxis.  Discharged Condition: [facility] Course:  Patient was admitted from ER after ENT service evaluation for treatment of recurrent epistaxis . After explanation of the procedure ,the risks,benefits,alternatives,tothe the patient and the spouse ,informed consent was obtained. Under endotracheal GA embolization of abnormal branches of the external carotid arteries  Bilaterally was performed using PVA particles with success. There were no complications.  Patient was monitored in the NICU overnight.  The following AM he was discharged under the care of his spouse to make an appointment to see his ENT  Consults: Anesthesia .ENT   Significant Diagnostic Studies: cerebral arteriogram  Treatments: Embolization of ECA branches with PVA particles  Discharge Exam: Blood pressure 159/85, pulse 63, temperature 98.2 F (36.8 C), temperature source Oral, resp. rate 13, height 6\' 2"  (1.88 m), weight 238 lb 8.6 oz (108.2 kg), SpO2 99.00%.  No futher bleeding. Neurologically intact. RT groin soft.  Disposition: Home   Medication List  As of 05/09/2012  2:33 PM   STOP taking these medications         HYDROcodone-acetaminophen 5-325 MG per tablet         TAKE these medications         cephALEXin 500 MG capsule   Commonly known as: KEFLEX   Take 500 mg by mouth 2 (two) times daily.      cloNIDine 0.1 MG tablet   Commonly known as: CATAPRES   Take 0.1 mg by mouth daily.      hydrALAZINE 25 MG tablet   Commonly known as: APRESOLINE   Take 1 tablet (25 mg total) by mouth 3 (three) times daily.      oxymetazoline 0.05 % nasal spray   Commonly known as: AFRIN   Place 2 sprays into the nose 2 (two) times daily as needed.  For nasal congestion      ramipril 5 MG capsule   Commonly known as: ALTACE   Take 5 mg by mouth 2 (two) times daily.             SignedOneal Wilson 05/09/2012, 2:33 PM

## 2012-10-10 DIAGNOSIS — Z981 Arthrodesis status: Secondary | ICD-10-CM | POA: Diagnosis not present

## 2012-10-10 DIAGNOSIS — M545 Low back pain, unspecified: Secondary | ICD-10-CM | POA: Diagnosis not present

## 2012-10-31 DIAGNOSIS — M545 Low back pain, unspecified: Secondary | ICD-10-CM | POA: Diagnosis not present

## 2012-10-31 DIAGNOSIS — M519 Unspecified thoracic, thoracolumbar and lumbosacral intervertebral disc disorder: Secondary | ICD-10-CM | POA: Diagnosis not present

## 2012-11-10 DIAGNOSIS — I10 Essential (primary) hypertension: Secondary | ICD-10-CM | POA: Diagnosis not present

## 2012-11-17 ENCOUNTER — Ambulatory Visit: Payer: Self-pay | Admitting: Rehabilitation

## 2012-11-17 DIAGNOSIS — M545 Low back pain, unspecified: Secondary | ICD-10-CM | POA: Diagnosis not present

## 2012-11-17 DIAGNOSIS — M5126 Other intervertebral disc displacement, lumbar region: Secondary | ICD-10-CM | POA: Diagnosis not present

## 2012-11-17 DIAGNOSIS — M5137 Other intervertebral disc degeneration, lumbosacral region: Secondary | ICD-10-CM | POA: Diagnosis not present

## 2012-11-17 DIAGNOSIS — Z9889 Other specified postprocedural states: Secondary | ICD-10-CM | POA: Diagnosis not present

## 2012-11-17 DIAGNOSIS — I10 Essential (primary) hypertension: Secondary | ICD-10-CM | POA: Diagnosis not present

## 2012-11-17 LAB — CREATININE, SERUM
Creatinine: 1.24 mg/dL (ref 0.60–1.30)
EGFR (African American): >60
EGFR (Non-African Amer.): >60

## 2012-11-20 DIAGNOSIS — M519 Unspecified thoracic, thoracolumbar and lumbosacral intervertebral disc disorder: Secondary | ICD-10-CM | POA: Diagnosis not present

## 2012-11-20 DIAGNOSIS — M549 Dorsalgia, unspecified: Secondary | ICD-10-CM | POA: Diagnosis not present

## 2012-11-20 DIAGNOSIS — M545 Low back pain, unspecified: Secondary | ICD-10-CM | POA: Diagnosis not present

## 2012-11-20 DIAGNOSIS — M47817 Spondylosis without myelopathy or radiculopathy, lumbosacral region: Secondary | ICD-10-CM | POA: Diagnosis not present

## 2012-11-20 DIAGNOSIS — R209 Unspecified disturbances of skin sensation: Secondary | ICD-10-CM | POA: Diagnosis not present

## 2012-11-20 DIAGNOSIS — IMO0001 Reserved for inherently not codable concepts without codable children: Secondary | ICD-10-CM | POA: Diagnosis not present

## 2013-01-05 DIAGNOSIS — I10 Essential (primary) hypertension: Secondary | ICD-10-CM | POA: Diagnosis not present

## 2013-01-21 DIAGNOSIS — M545 Low back pain, unspecified: Secondary | ICD-10-CM | POA: Diagnosis not present

## 2013-02-16 DIAGNOSIS — IMO0001 Reserved for inherently not codable concepts without codable children: Secondary | ICD-10-CM | POA: Diagnosis not present

## 2013-02-24 DIAGNOSIS — R110 Nausea: Secondary | ICD-10-CM | POA: Diagnosis not present

## 2013-02-24 DIAGNOSIS — R350 Frequency of micturition: Secondary | ICD-10-CM | POA: Diagnosis not present

## 2013-02-24 DIAGNOSIS — R7309 Other abnormal glucose: Secondary | ICD-10-CM | POA: Diagnosis not present

## 2013-03-11 DIAGNOSIS — IMO0001 Reserved for inherently not codable concepts without codable children: Secondary | ICD-10-CM | POA: Diagnosis not present

## 2013-03-16 ENCOUNTER — Ambulatory Visit (INDEPENDENT_AMBULATORY_CARE_PROVIDER_SITE_OTHER): Payer: Medicare Other | Admitting: Endocrinology

## 2013-03-16 ENCOUNTER — Encounter: Payer: Self-pay | Admitting: Endocrinology

## 2013-03-16 ENCOUNTER — Ambulatory Visit: Payer: Medicare Other | Admitting: Endocrinology

## 2013-03-16 VITALS — BP 130/78 | HR 78 | Wt 237.0 lb

## 2013-03-16 DIAGNOSIS — J309 Allergic rhinitis, unspecified: Secondary | ICD-10-CM

## 2013-03-16 DIAGNOSIS — E109 Type 1 diabetes mellitus without complications: Secondary | ICD-10-CM | POA: Diagnosis not present

## 2013-03-16 DIAGNOSIS — I10 Essential (primary) hypertension: Secondary | ICD-10-CM | POA: Diagnosis not present

## 2013-03-16 NOTE — Patient Instructions (Addendum)
good diet and exercise habits significanly improve the control of your diabetes.  please let me know if you wish to be referred to a dietician.  high blood sugar is very risky to your health.  you should see an eye doctor every year.  You are at higher than average risk for pneumonia and hepatitis-B.  You should be vaccinated against both.   controlling your blood pressure and cholesterol drastically reduces the damage diabetes does to your body.  this also applies to quitting smoking.  please discuss these with your doctor.  you should take an aspirin every day, unless you have been advised by a doctor not to. check your blood sugar twice a day.  vary the time of day when you check, between before the 3 meals, and at bedtime.  also check if you have symptoms of your blood sugar being too high or too low.  please keep a record of the readings and bring it to your next appointment here.  please call us sooner if your blood sugar goes below 70, or if you have a lot of readings over 200.  we will need to take this complex situation in stages.   Please continue the lantus, 12 units daily, and: Take novolog (not novolin R), 6 units 3 times a day (just before each meal). Please come back for a follow-up appointment in 2 weeks.

## 2013-03-16 NOTE — Progress Notes (Signed)
Subjective:    Patient ID: [name], male    DOB: [birth year], 37 y.o.   MRN: [id number]  HPI pt states 2 years h/o dm.  he is unaware of any chronic complications.  he was started on insulin+metformin.  His a1c improved to 5.7, so meds were d/c'ed 1 year ago.  He now has cbg's vary from 240-500.  pt says his diet and exercise are good.  He started on lantus, 12 units qd, 2 weeks ago.    He has slight dryness of the mouth, and assoc polyuria.   Past Medical History  Diagnosis Date  . Hypertension   . Diabetes mellitus     Past Surgical History  Procedure Laterality Date  . Back surgery    . Back fusion      l4,l5  . Ankle surgery      both     History   Social History  . Marital Status: Married    Spouse Name: N/A    Number of Children: N/A  . Years of Education: N/A   Occupational History  . Not on file.   Social History Main Topics  . Smoking status: Never Smoker   . Smokeless tobacco: Not on file  . Alcohol Use: No  . Drug Use: No  . Sexually Active:    Other Topics Concern  . Not on file   Social History Narrative  . No narrative on file    Current Outpatient Prescriptions on File Prior to Visit  Medication Sig Dispense Refill  . cephALEXin (KEFLEX) 500 MG capsule Take 500 mg by mouth 2 (two) times daily.      . cloNIDine (CATAPRES) 0.1 MG tablet Take 0.1 mg by mouth daily.      Marland Kitchen oxymetazoline (AFRIN) 0.05 % nasal spray Place 2 sprays into the nose 2 (two) times daily as needed. For nasal congestion      . ramipril (ALTACE) 5 MG capsule Take 5 mg by mouth 2 (two) times daily.       No current facility-administered medications on file prior to visit.   No Known Allergies  No family history on file. DM: none BP 130/78  Pulse 78  Wt 237 lb (107.502 kg)  BMI 30.42 kg/m2  SpO2 97%  Review of Systems denies weight loss, headache, chest pain, sob, cramps, excessive diaphoresis, memory loss, depression, ED sxs, rhinorrhea, and easy bruising.    He has blurry vision, fatigue, and nausea.     Objective:   Physical Exam VS: see vs page GEN: no distress HEAD: head: no deformity eyes: no periorbital swelling, no proptosis external nose and ears are normal mouth: no lesion seen NECK: supple, thyroid is not enlarged CHEST WALL: no deformity LUNGS: clear to auscultation BREASTS:  No gynecomastia CV: reg rate and rhythm, no murmur ABD: abdomen is soft, nontender.  no hepatosplenomegaly.  not distended.  no hernia MUSCULOSKELETAL: muscle bulk and strength are grossly normal.  no obvious joint swelling.  gait is normal and steady.  Old healed surgical scar, on the lower back. EXTEMITIES: no deformity.  no ulcer on the feet.  feet are of normal color and temp.  no edema PULSES: dorsalis pedis intact bilat.  no carotid bruit NEURO:  cn 2-12 grossly intact.   readily moves all 4's.  sensation is intact to touch on the feet SKIN:  Normal texture and temperature.  No rash or suspicious lesion is visible.   NODES:  None palpable at the neck PSYCH:  alert, oriented x3.  Does not appear anxious nor depressed.  (pt says a1c was 11%, last week).      Assessment & Plan:  DM:  Several factors favor dx of type 1 DM.  needs increased rx Nausea. Possibly due to severe hyperglycemia, but metformin can contribute. Fatigue, prob due to hyperglycemia

## 2013-03-17 DIAGNOSIS — J309 Allergic rhinitis, unspecified: Secondary | ICD-10-CM | POA: Insufficient documentation

## 2013-03-17 DIAGNOSIS — E1021 Type 1 diabetes mellitus with diabetic nephropathy: Secondary | ICD-10-CM | POA: Insufficient documentation

## 2013-03-17 DIAGNOSIS — I10 Essential (primary) hypertension: Secondary | ICD-10-CM | POA: Insufficient documentation

## 2013-03-25 DIAGNOSIS — IMO0001 Reserved for inherently not codable concepts without codable children: Secondary | ICD-10-CM | POA: Diagnosis not present

## 2013-03-25 DIAGNOSIS — I10 Essential (primary) hypertension: Secondary | ICD-10-CM | POA: Diagnosis not present

## 2013-03-28 IMAGING — XA IR ANGIO INTRA EXTRACRAN SEL COM CAROTID INNOMINATE BILAT MOD SE
1 series · 10 of 24 positions shown · IV contrast (IODINE)
Comparison: None.

CLINICAL DATA: Patient with recurrent epistaxis from the left
nostril and occasionally from the right nostril despite
cauterization by ENT.

BILATERAL COMMON CAROTID ARTERIOGRAMS AND BILATERAL EXTERNAL
CAROTID ARTERY ARTERIOGRAMS FOLLOWED BY SUPERSELECTIVE EMBOLIZATION
OF BILATERAL SPHENOPALATINE ARTERIES, NASAL BRANCHES OF THE FACIAL
ARTERIES, AND THE INFRAORBITAL ARTERIES USING PVA PARTICLES

[Series 300: ir radiologist eval & mgmt · 10 of 635 slices shown]
[im 28/635]
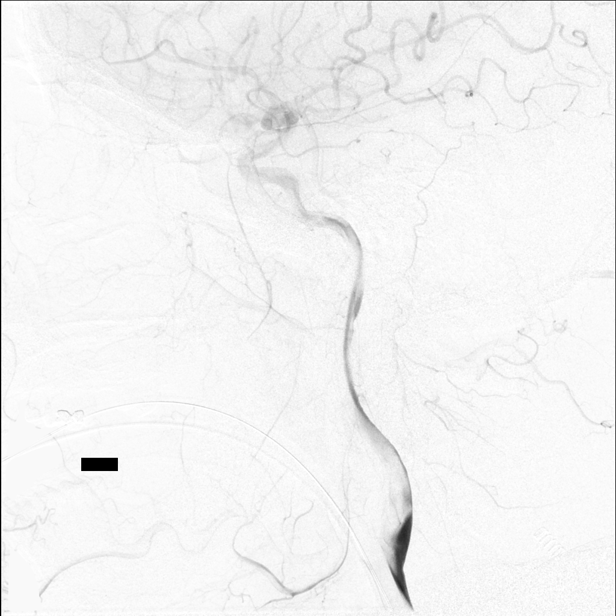
[im 83/635]
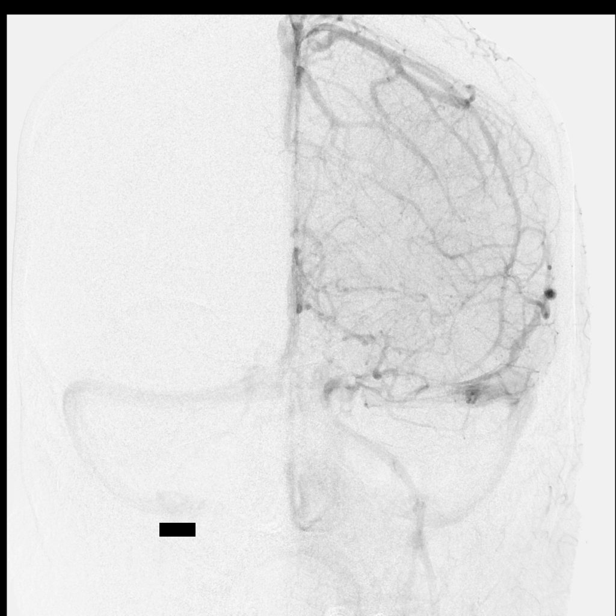
[im 166/635]
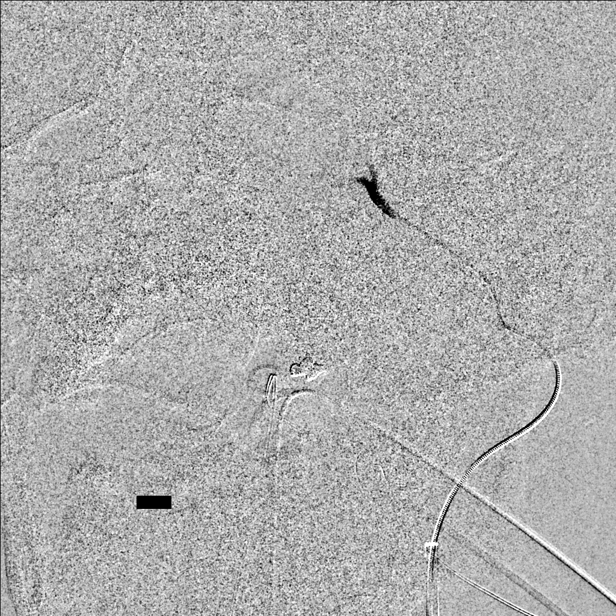
[im 221/635]
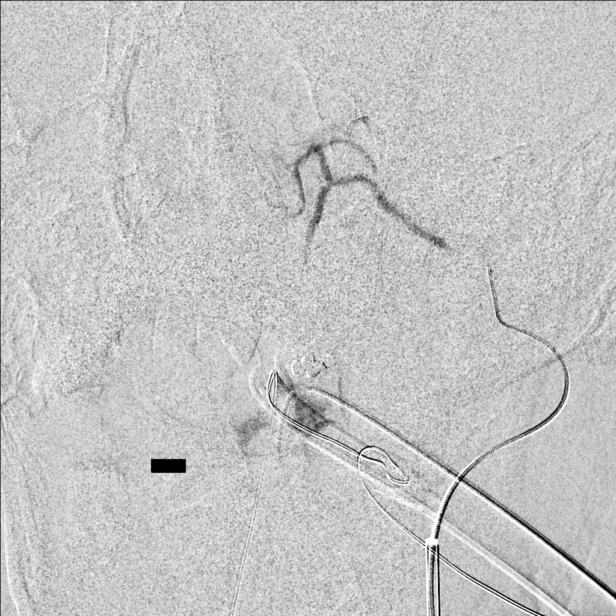
[im 276/635]
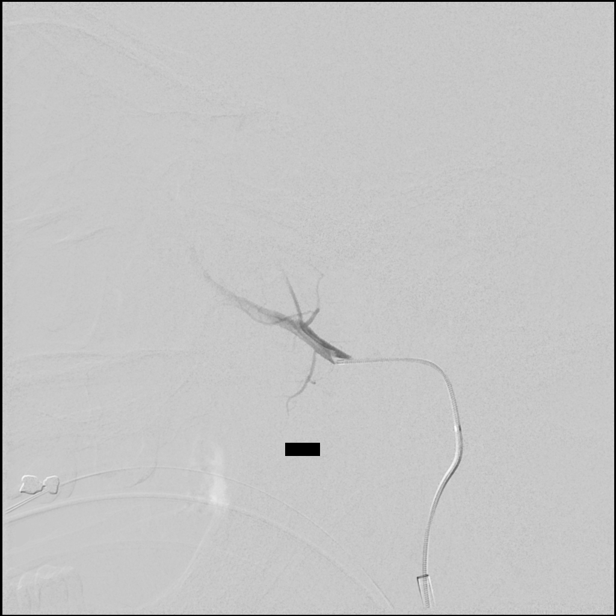
[im 359/635]
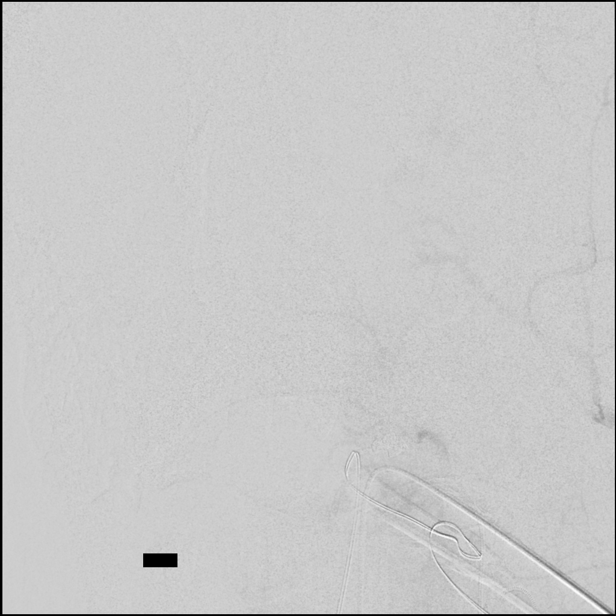
[im 414/635]
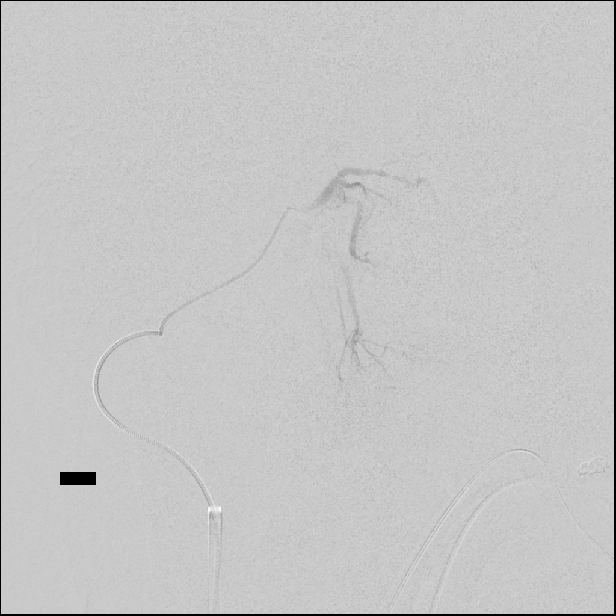
[im 497/635]
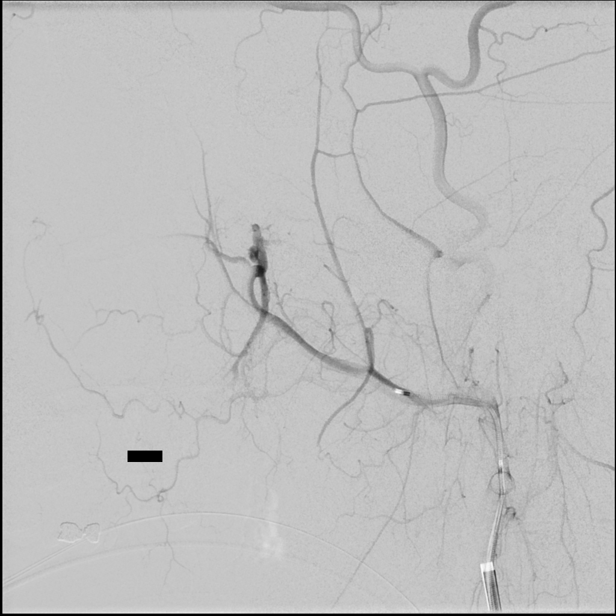
[im 552/635]
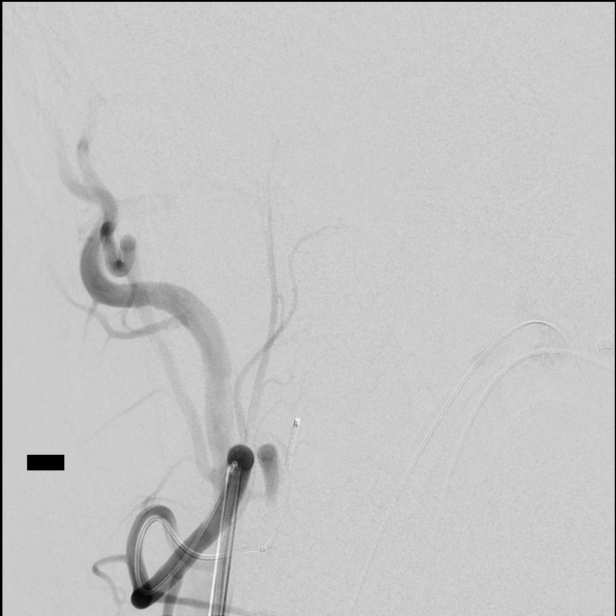
[im 607/635]
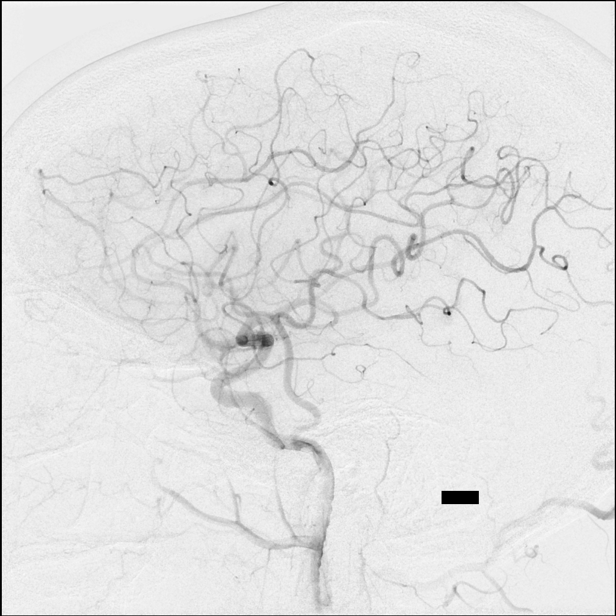

[10 of 24 positions shown; findings below may reference images not displayed]

Following a full explanation of the procedure along with the
potential associated complications, an informed witnessed consent
was obtained.

The patient was put under general anesthesia by the [REDACTED] at [HOSPITAL].

The right groin was prepped and draped in the usual sterile
fashion.  Thereafter using a modified Seldinger technique,
transfemoral access into the right common femoral artery was
obtained without difficulty.  Over a 0.035 inch   guidewire, a 5-
French Pinnacle sheath was inserted.  Through this and also over a
0.035-inch guidewire, a 5-French JB1 catheter was advanced to the
aortic arch region and selectively positioned in the left common
carotid artery and the right common carotid artery.  Also obtained
were selective external carotid artery injections.

There were no acute complications.  The patient tolerated the
procedure well.

Medications as per general anesthesia.
FINDINGS: The left common carotid arteriogram demonstrates the left internal
carotid artery at the bulb to the cranial skull base to opacify
normally.  The petrous, the cavernous and the supraclinoid segments
also opacify normally.  A dominant left posterior communicating
artery is seen to opacify the left posterior cerebral artery
distribution.

The left middle and the left anterior cerebral arteries opacify
normally into capillary venous phases.

The left external carotid arteriogram demonstrates the external
carotid artery branches to be normally opacified except for the
sphenopalatine arteries and the septal branches of the facial
artery which demonstrated blush at their terminations.

The right common carotid arteriogram similarly demonstrates the
right internal carotid artery at the bulb to the cranial skull base
to opacify normally.

The petrous, the cavernous and the supraclinoid segments are seen
to opacify normally.  The right middle and the right anterior
cerebral arteries are seen to opacify normally into capillary and
venous phases.  Transient opacification of the anterior
communicating artery was noted.

Endovascular embolization of abnormal bilateral external carotid
artery branches using PVA particles.

The diagnostic JB1 catheter in the left common carotid artery was
exchanged over a 0.035-inch 300 cm Rosen exchange guidewire for a 6-
French 65 cm neurovascular sheath using biplane roadmap technique
and constant fluoroscopic guidance.  Good aspiration was obtained
from the side port of the neurovascular sheath.  A gentle contrast
injection demonstrated no evidence of spasms, dissections or of
intraluminal filling defects.

This was then connected to continuous heparinized saline infusion.

Over the Rosen exchange guidewire, a 6-French 90 cm Brite Tip Envoy
guide catheter was then advanced and positioned just proximal to
the left common carotid bifurcation.

The guidewire was removed.  Again good aspiration was obtained from
the hub of the 6-French guide catheter.  A gentle contrast
injection demonstrated no evidence of spasm, dissections or of
intraluminal filling defects.

Using biplane roadmap technique and constant fluoroscopic guidance
over a 0.035-inch Roadrunner guidewire, the 6-French guide catheter
was then advanced into the proximal left external carotid artery.
The guidewire was removed.  Good aspiration was obtained from the
hub of the 6-French guide catheter.  A gentle contrast injection
demonstrated no change in the external carotid artery circulation.

At this time in a coaxial manner and with constant heparinized
saline infusion using biplane roadmap technique and constant
fluoroscopic guidance, a Rapid Transit 2-tip microcatheter which
had been steam-shaped was advanced over 0.014-inch Softip Synchro
microguidewire to the distal end of the guide catheter.

With the microguidewire leading with a J-tip configuration to avoid
dissections or inducing spasm, the combination was navigated using
a torque device into the sphenopalatine branch of the left external
carotid artery.  The microguidewire was removed.

Good aspiration was obtained from the hub of the microcatheter. A
gentle contrast injection demonstrated no evidence of spasm,
dissections or of filling defects.

Controlled embolizations were then performed using the PVA
particles of 200-300 microns, 350-500 microns and 500-750 microns.
These were mixed in 75% contrast and 25% heparinized saline
infusion.  Embolization was performed under fluoroscopic guidance
until there was reflux of contrast at the tip of the microcatheter.

The microcatheter tip was then gently retrieved more proximally
with control arteriograms performed to allow for further safe
embolization after having excluded dangerous anastomosis.

Using a similar method, superselective catheterizations with
embolization was carried out of the infraorbital branch of the left
external carotid artery, and the septal branch of the left facial
artery.

A control arteriogram performed through the 6-French guide catheter
following the embolizations demonstrated complete angiographic
stasis in the embolized vessels without any angiographic blush.

A control arteriogram was then perfor[REDACTED]ed over the brain
from the left common carotid artery.  This demonstrated no evidence
of intraluminal filling defects.

The 6-French 90 cm Brite Tip Envoy guide catheter was then
selectively positioned over a 0.035-inch Roadrunner guidewire using
biplane roadmap technique and constant fluoroscopic guidance into
the right common carotid artery.  The guidewire was removed. A
gentle contrast injection demonstrated no evidence of spasm,
dissections or of intraluminal filling defects.

The 6-French guide catheter was then advanced into the proximal
right external carotid artery over a 0.035-inch Roadrunner
guidewire.  The guidewire was removed.  Good aspiration was
obtained without evidence of spasm, dissections or of intraluminal
filling defects.

Again using biplane roadmap technique and constant fluoroscopic
guidance in a coaxial manner and with constant heparinized saline
infusion, the combination of the Rapid Transit 2-tip microcatheter
which had steam-shaped was advanced over a 0.014-inch Softip
Synchro microguidewire to the right sphenopalatine artery.  Again
the guidewire was removed.  Good aspiration was obtained from the
hub of the microcatheter.  After having excluded dangerous
anastomoses, superselective embolization was then performed of this
vessel using PVA particles of 200-350 microns, 350-500 microns and
550-700 microns, mixed in 75% contrast and 25% heparinized saline
infusion.  Again embolizations were performed until there was
reflux of contrast at the tip of the microcatheter.

Superselective embolization was performed of the right
sphenopalatine artery, the infraorbital artery and the septal
branch of the right facial artery.

The microcatheter was then removed.  A control arteriogram
performed through the 6-French guide catheter in the proximal right
external carotid artery demonstrated complete angiographic stasis
of the embolized vessels with no abnormal blush noted.

The microcatheter was removed.  A control arteriogram was then
performed from the 6-French guide catheter in the right common
carotid artery. Again this demonstrated no angiographic evidence of
intraluminal filling defects intracranially.

There were no acute changes noted in the patient's blood pressure
or neurological status.

The patient's ACT was maintained in the region of 180 seconds.

The 6-French guide catheter and 6-French neurovascular sheath were
retrieved into the abdominal aorta and exchanged over a J-tip
guidewire for a 5-French Pinnacle sheath. This was then removed
with successful placement of a femoral arterial closure device.

The patient's general anesthesia was then reversed and the patient
was extubated.  Upon recovery the patient demonstrated no new
neurological signs or symptoms.

IMPRESSION
1.  Status post superselective embolization of the external carotid
artery branches as described above for treatment of recurrent
epistaxis using PVA particles, of sizes 200-350 microns, and 350-
500 microns and 550-700 microns as described above.

The patient's overnight stay in the Neuro ICU was unremarkable.
The patient had no recurrent episodes of epistaxis.  The following
day, the patient was discharged home under the care of his spouse
to follow up with his referring ENT physician.

## 2013-04-10 DIAGNOSIS — I10 Essential (primary) hypertension: Secondary | ICD-10-CM | POA: Diagnosis not present

## 2013-04-10 DIAGNOSIS — IMO0002 Reserved for concepts with insufficient information to code with codable children: Secondary | ICD-10-CM | POA: Diagnosis not present

## 2013-04-10 DIAGNOSIS — E118 Type 2 diabetes mellitus with unspecified complications: Secondary | ICD-10-CM | POA: Diagnosis not present

## 2013-04-10 DIAGNOSIS — E1165 Type 2 diabetes mellitus with hyperglycemia: Secondary | ICD-10-CM | POA: Diagnosis not present

## 2013-04-10 DIAGNOSIS — E782 Mixed hyperlipidemia: Secondary | ICD-10-CM | POA: Diagnosis not present

## 2013-04-22 DIAGNOSIS — M545 Low back pain, unspecified: Secondary | ICD-10-CM | POA: Diagnosis not present

## 2013-05-28 DIAGNOSIS — IMO0001 Reserved for inherently not codable concepts without codable children: Secondary | ICD-10-CM | POA: Diagnosis not present

## 2013-05-28 DIAGNOSIS — R7989 Other specified abnormal findings of blood chemistry: Secondary | ICD-10-CM | POA: Diagnosis not present

## 2013-05-28 DIAGNOSIS — I10 Essential (primary) hypertension: Secondary | ICD-10-CM | POA: Diagnosis not present

## 2013-05-28 DIAGNOSIS — E785 Hyperlipidemia, unspecified: Secondary | ICD-10-CM | POA: Diagnosis not present

## 2013-06-04 DIAGNOSIS — M25569 Pain in unspecified knee: Secondary | ICD-10-CM | POA: Diagnosis not present

## 2013-06-16 ENCOUNTER — Other Ambulatory Visit: Payer: Self-pay | Admitting: Orthopedic Surgery

## 2013-06-16 DIAGNOSIS — M25562 Pain in left knee: Secondary | ICD-10-CM

## 2013-06-16 DIAGNOSIS — M1710 Unilateral primary osteoarthritis, unspecified knee: Secondary | ICD-10-CM | POA: Diagnosis not present

## 2013-06-16 DIAGNOSIS — IMO0002 Reserved for concepts with insufficient information to code with codable children: Secondary | ICD-10-CM | POA: Diagnosis not present

## 2013-06-16 DIAGNOSIS — M25569 Pain in unspecified knee: Secondary | ICD-10-CM | POA: Diagnosis not present

## 2013-06-19 ENCOUNTER — Ambulatory Visit
Admission: RE | Admit: 2013-06-19 | Discharge: 2013-06-19 | Disposition: A | Discharge status: Home / Self Care | Payer: Medicare Other | Source: Ambulatory Visit | Attending: Orthopedic Surgery | Admitting: Orthopedic Surgery

## 2013-06-19 DIAGNOSIS — M2240 Chondromalacia patellae, unspecified knee: Secondary | ICD-10-CM | POA: Diagnosis not present

## 2013-06-19 DIAGNOSIS — M25562 Pain in left knee: Secondary | ICD-10-CM

## 2013-06-19 DIAGNOSIS — M7120 Synovial cyst of popliteal space [Baker], unspecified knee: Secondary | ICD-10-CM | POA: Diagnosis not present

## 2013-06-19 DIAGNOSIS — M1710 Unilateral primary osteoarthritis, unspecified knee: Secondary | ICD-10-CM | POA: Diagnosis not present

## 2013-06-19 DIAGNOSIS — IMO0002 Reserved for concepts with insufficient information to code with codable children: Secondary | ICD-10-CM | POA: Diagnosis not present

## 2013-06-29 DIAGNOSIS — M25569 Pain in unspecified knee: Secondary | ICD-10-CM | POA: Insufficient documentation

## 2013-06-29 DIAGNOSIS — M25519 Pain in unspecified shoulder: Secondary | ICD-10-CM | POA: Diagnosis not present

## 2013-07-06 ENCOUNTER — Other Ambulatory Visit: Payer: Self-pay | Admitting: Orthopedic Surgery

## 2013-07-06 DIAGNOSIS — M25512 Pain in left shoulder: Secondary | ICD-10-CM

## 2013-07-09 ENCOUNTER — Ambulatory Visit
Admission: RE | Admit: 2013-07-09 | Discharge: 2013-07-09 | Disposition: A | Discharge status: Home / Self Care | Payer: Medicare Other | Source: Ambulatory Visit | Attending: Orthopedic Surgery | Admitting: Orthopedic Surgery

## 2013-07-09 DIAGNOSIS — S43429A Sprain of unspecified rotator cuff capsule, initial encounter: Secondary | ICD-10-CM | POA: Diagnosis not present

## 2013-07-09 DIAGNOSIS — M25512 Pain in left shoulder: Secondary | ICD-10-CM

## 2013-07-17 DIAGNOSIS — M25519 Pain in unspecified shoulder: Secondary | ICD-10-CM | POA: Diagnosis not present

## 2013-08-31 DIAGNOSIS — I10 Essential (primary) hypertension: Secondary | ICD-10-CM | POA: Diagnosis not present

## 2013-08-31 DIAGNOSIS — Z23 Encounter for immunization: Secondary | ICD-10-CM | POA: Diagnosis not present

## 2013-08-31 DIAGNOSIS — E1129 Type 2 diabetes mellitus with other diabetic kidney complication: Secondary | ICD-10-CM | POA: Diagnosis not present

## 2013-08-31 DIAGNOSIS — E785 Hyperlipidemia, unspecified: Secondary | ICD-10-CM | POA: Diagnosis not present

## 2013-08-31 DIAGNOSIS — N182 Chronic kidney disease, stage 2 (mild): Secondary | ICD-10-CM | POA: Diagnosis not present

## 2013-09-24 DIAGNOSIS — M25519 Pain in unspecified shoulder: Secondary | ICD-10-CM | POA: Diagnosis not present

## 2013-09-25 ENCOUNTER — Other Ambulatory Visit: Payer: Self-pay | Admitting: Orthopedic Surgery

## 2013-09-25 DIAGNOSIS — M25511 Pain in right shoulder: Secondary | ICD-10-CM

## 2013-10-03 ENCOUNTER — Ambulatory Visit
Admission: RE | Admit: 2013-10-03 | Discharge: 2013-10-03 | Disposition: A | Discharge status: Home / Self Care | Payer: Medicare Other | Source: Ambulatory Visit | Attending: Orthopedic Surgery | Admitting: Orthopedic Surgery

## 2013-10-03 DIAGNOSIS — M25519 Pain in unspecified shoulder: Secondary | ICD-10-CM | POA: Diagnosis not present

## 2013-10-03 DIAGNOSIS — M25511 Pain in right shoulder: Secondary | ICD-10-CM

## 2013-10-07 DIAGNOSIS — M25519 Pain in unspecified shoulder: Secondary | ICD-10-CM | POA: Diagnosis not present

## 2013-12-08 DIAGNOSIS — I10 Essential (primary) hypertension: Secondary | ICD-10-CM | POA: Diagnosis not present

## 2013-12-08 DIAGNOSIS — E785 Hyperlipidemia, unspecified: Secondary | ICD-10-CM | POA: Diagnosis not present

## 2013-12-08 DIAGNOSIS — E1129 Type 2 diabetes mellitus with other diabetic kidney complication: Secondary | ICD-10-CM | POA: Diagnosis not present

## 2013-12-08 DIAGNOSIS — N182 Chronic kidney disease, stage 2 (mild): Secondary | ICD-10-CM | POA: Diagnosis not present

## 2014-03-09 DIAGNOSIS — E782 Mixed hyperlipidemia: Secondary | ICD-10-CM | POA: Diagnosis not present

## 2014-03-09 DIAGNOSIS — IMO0001 Reserved for inherently not codable concepts without codable children: Secondary | ICD-10-CM | POA: Diagnosis not present

## 2014-03-09 DIAGNOSIS — I10 Essential (primary) hypertension: Secondary | ICD-10-CM | POA: Diagnosis not present

## 2014-03-18 DIAGNOSIS — E119 Type 2 diabetes mellitus without complications: Secondary | ICD-10-CM | POA: Diagnosis not present

## 2014-04-01 DIAGNOSIS — M25519 Pain in unspecified shoulder: Secondary | ICD-10-CM | POA: Diagnosis not present

## 2014-04-26 ENCOUNTER — Encounter: Payer: Medicare Other | Attending: Internal Medicine | Admitting: *Deleted

## 2014-04-26 ENCOUNTER — Encounter: Payer: Self-pay | Admitting: *Deleted

## 2014-04-26 VITALS — Ht 73.5 in | Wt 238.0 lb

## 2014-04-26 DIAGNOSIS — E785 Hyperlipidemia, unspecified: Secondary | ICD-10-CM | POA: Diagnosis not present

## 2014-04-26 DIAGNOSIS — Z713 Dietary counseling and surveillance: Secondary | ICD-10-CM | POA: Insufficient documentation

## 2014-04-26 DIAGNOSIS — E109 Type 1 diabetes mellitus without complications: Secondary | ICD-10-CM

## 2014-04-26 NOTE — Patient Instructions (Signed)
Plan:  Aim for 4 Carb Choices per meal (60 grams) +/- 1 either way  Aim for 0-2 Carbs per snack if hungry  Include protein in moderation with your meals and snacks Consider reading food labels for Total Carbohydrate of foods Continue with your activity level as tolerated Consider checking BG at alternate times per day  Continue taking Lantus insulin medication in AM at consistent times as directed by MD

## 2014-04-26 NOTE — Progress Notes (Signed)
Appt start time: 0930 end time:  1100.  Assessment:  Patient was seen on  04/26/2014 for individual diabetes education. Here with his wife who listened but tried not to contribute towards the conversation, encouraging her husband to answer and interact. He states he is not working right now, disabled. He goes to the gym 2-3 times a week for a couple of hours each time and will walk and do various stretching exercises at home on the other days. Lives with wife and 4 kids ages 255-717 years old. Wife does most of the grocery shopping and meal preparation. SMBG 1 - 2 times a day with reported range typically in the 200's. He reports hypoglycemia a couple of times in the 60's but not recently.    Current HbA1c: 11.7%  Preferred Learning Style:   No preference indicated   Learning Readiness:   Contemplating  Some change in progress  MEDICATIONS: see list, diabetes medications are Lantus and Novolog  DIETARY INTAKE:  24-hr recall:  B ( AM): grits, bacon, whole wheat bagel OR pancakes with SF syrup, sausage, water or hot tea with Splenda Snk ( AM): PNB crackers OR fresh fruit  L ( PM): hot meal OR fast food of Qtr pounder burger with medium fries, water or insweet tea or diet soda OR shrimp scampi with wheat noodles Snk ( PM): same as AM OR Honey Comb cereal with 2% milk D ( PM): lean meat, occasionally a starch, vegetables, wheat roll,  Snk ( PM): sandwich with crackers OR PNB crackers Beverages: hot tea, diet soda, water  Usual physical activity: gym, walks,   Estimated energy needs: 2100 calories 235 g carbohydrates 158 g protein 58 g fat  Progress Towards Goal(s):  In progress.   Nutritional Diagnosis:  NB-1.1 Food and nutrition-related knowledge deficit As related to diabetes control.  As evidenced by A1c of 11.7%.    Intervention:  Nutrition counseling provided.  Discussed diabetes disease process and treatment options.  Discussed physiology of diabetes and role of obesity on  insulin resistance.  Encouraged moderate weight reduction to improve glucose levels.  Discussed role of medications and diet in glucose control  Provided education on macronutrients on glucose levels.  Provided education on carb counting, importance of regularly scheduled meals/snacks, and meal planning  Discussed effects of physical activity on glucose levels and long-term glucose control.  Recommended 150 minutes of physical activity/week.  Reviewed patient medications.  Discussed role of medication on blood glucose and possible side effects. Reviewed different types of insulin and their action times. Also discussed potential of Carb Ratio as well as Correction Factor  Discussed blood glucose monitoring and interpretation.  Discussed recommended target ranges and individual ranges.    Described short-term complications: hyper- and hypo-glycemia.  Discussed causes,symptoms, and treatment options.  Discussed prevention, detection, and treatment of long-term complications.  Discussed the role of prolonged elevated glucose levels on body systems.  Discussed role of stress on blood glucose levels and discussed strategies to manage psychosocial issues.  Discussed recommendations for long-term diabetes self-care.  Provided checklist for medical, dental, and emotional self-care.  Teaching Method Utilized: Visual, Auditory and Hands on  Handouts given during visit include: Living Well with Diabetes Carb Counting and Food Label handouts Meal Plan Card  Insulin Handout  Support Group Handout  Barriers to learning/adherence to lifestyle change: none unless still in some denial of chronic disease  Diabetes self-care support plan:   [facility] support group available  Demonstrated degree of understanding via:  Teach  Back   Monitoring/Evaluation:  Dietary intake, exercise, SMBG, and body weight prn.

## 2014-05-26 DIAGNOSIS — G8918 Other acute postprocedural pain: Secondary | ICD-10-CM | POA: Diagnosis not present

## 2014-05-26 DIAGNOSIS — M24119 Other articular cartilage disorders, unspecified shoulder: Secondary | ICD-10-CM | POA: Diagnosis not present

## 2014-05-26 DIAGNOSIS — M19019 Primary osteoarthritis, unspecified shoulder: Secondary | ICD-10-CM | POA: Diagnosis not present

## 2014-05-26 DIAGNOSIS — M25819 Other specified joint disorders, unspecified shoulder: Secondary | ICD-10-CM | POA: Diagnosis not present

## 2014-05-26 DIAGNOSIS — S43499A Other sprain of unspecified shoulder joint, initial encounter: Secondary | ICD-10-CM | POA: Diagnosis not present

## 2014-06-01 DIAGNOSIS — Z9889 Other specified postprocedural states: Secondary | ICD-10-CM | POA: Insufficient documentation

## 2014-06-07 DIAGNOSIS — Z6832 Body mass index (BMI) 32.0-32.9, adult: Secondary | ICD-10-CM | POA: Diagnosis not present

## 2014-06-07 DIAGNOSIS — IMO0001 Reserved for inherently not codable concepts without codable children: Secondary | ICD-10-CM | POA: Diagnosis not present

## 2014-06-07 DIAGNOSIS — L83 Acanthosis nigricans: Secondary | ICD-10-CM | POA: Diagnosis not present

## 2014-06-07 DIAGNOSIS — E669 Obesity, unspecified: Secondary | ICD-10-CM | POA: Diagnosis not present

## 2014-06-08 DIAGNOSIS — M6281 Muscle weakness (generalized): Secondary | ICD-10-CM | POA: Diagnosis not present

## 2014-06-08 DIAGNOSIS — M25519 Pain in unspecified shoulder: Secondary | ICD-10-CM | POA: Diagnosis not present

## 2014-06-08 DIAGNOSIS — M25619 Stiffness of unspecified shoulder, not elsewhere classified: Secondary | ICD-10-CM | POA: Diagnosis not present

## 2014-06-10 DIAGNOSIS — M25519 Pain in unspecified shoulder: Secondary | ICD-10-CM | POA: Diagnosis not present

## 2014-06-10 DIAGNOSIS — M6281 Muscle weakness (generalized): Secondary | ICD-10-CM | POA: Diagnosis not present

## 2014-06-10 DIAGNOSIS — M25619 Stiffness of unspecified shoulder, not elsewhere classified: Secondary | ICD-10-CM | POA: Diagnosis not present

## 2014-06-22 DIAGNOSIS — IMO0001 Reserved for inherently not codable concepts without codable children: Secondary | ICD-10-CM | POA: Diagnosis not present

## 2014-06-23 DIAGNOSIS — M25619 Stiffness of unspecified shoulder, not elsewhere classified: Secondary | ICD-10-CM | POA: Diagnosis not present

## 2014-06-23 DIAGNOSIS — M6281 Muscle weakness (generalized): Secondary | ICD-10-CM | POA: Diagnosis not present

## 2014-06-23 DIAGNOSIS — M25519 Pain in unspecified shoulder: Secondary | ICD-10-CM | POA: Diagnosis not present

## 2014-06-29 DIAGNOSIS — M6281 Muscle weakness (generalized): Secondary | ICD-10-CM | POA: Diagnosis not present

## 2014-06-29 DIAGNOSIS — M25619 Stiffness of unspecified shoulder, not elsewhere classified: Secondary | ICD-10-CM | POA: Diagnosis not present

## 2014-06-29 DIAGNOSIS — M25519 Pain in unspecified shoulder: Secondary | ICD-10-CM | POA: Diagnosis not present

## 2014-07-01 DIAGNOSIS — M6281 Muscle weakness (generalized): Secondary | ICD-10-CM | POA: Diagnosis not present

## 2014-07-01 DIAGNOSIS — M25519 Pain in unspecified shoulder: Secondary | ICD-10-CM | POA: Diagnosis not present

## 2014-07-01 DIAGNOSIS — M25619 Stiffness of unspecified shoulder, not elsewhere classified: Secondary | ICD-10-CM | POA: Diagnosis not present

## 2014-07-06 DIAGNOSIS — M25519 Pain in unspecified shoulder: Secondary | ICD-10-CM | POA: Diagnosis not present

## 2014-07-06 DIAGNOSIS — M25619 Stiffness of unspecified shoulder, not elsewhere classified: Secondary | ICD-10-CM | POA: Diagnosis not present

## 2014-07-06 DIAGNOSIS — M6281 Muscle weakness (generalized): Secondary | ICD-10-CM | POA: Diagnosis not present

## 2014-07-08 DIAGNOSIS — M6281 Muscle weakness (generalized): Secondary | ICD-10-CM | POA: Diagnosis not present

## 2014-07-08 DIAGNOSIS — M25519 Pain in unspecified shoulder: Secondary | ICD-10-CM | POA: Diagnosis not present

## 2014-07-08 DIAGNOSIS — M25619 Stiffness of unspecified shoulder, not elsewhere classified: Secondary | ICD-10-CM | POA: Diagnosis not present

## 2014-07-12 DIAGNOSIS — L83 Acanthosis nigricans: Secondary | ICD-10-CM | POA: Diagnosis not present

## 2014-07-12 DIAGNOSIS — IMO0001 Reserved for inherently not codable concepts without codable children: Secondary | ICD-10-CM | POA: Diagnosis not present

## 2014-07-12 DIAGNOSIS — Z6832 Body mass index (BMI) 32.0-32.9, adult: Secondary | ICD-10-CM | POA: Diagnosis not present

## 2014-07-12 DIAGNOSIS — E669 Obesity, unspecified: Secondary | ICD-10-CM | POA: Diagnosis not present

## 2014-07-13 DIAGNOSIS — M6281 Muscle weakness (generalized): Secondary | ICD-10-CM | POA: Diagnosis not present

## 2014-07-13 DIAGNOSIS — M25619 Stiffness of unspecified shoulder, not elsewhere classified: Secondary | ICD-10-CM | POA: Diagnosis not present

## 2014-07-13 DIAGNOSIS — M25519 Pain in unspecified shoulder: Secondary | ICD-10-CM | POA: Diagnosis not present

## 2014-07-13 DIAGNOSIS — E663 Overweight: Secondary | ICD-10-CM | POA: Diagnosis not present

## 2014-07-13 DIAGNOSIS — Z6832 Body mass index (BMI) 32.0-32.9, adult: Secondary | ICD-10-CM | POA: Diagnosis not present

## 2014-07-13 DIAGNOSIS — E782 Mixed hyperlipidemia: Secondary | ICD-10-CM | POA: Diagnosis not present

## 2014-07-13 DIAGNOSIS — I10 Essential (primary) hypertension: Secondary | ICD-10-CM | POA: Diagnosis not present

## 2014-07-15 DIAGNOSIS — M25619 Stiffness of unspecified shoulder, not elsewhere classified: Secondary | ICD-10-CM | POA: Diagnosis not present

## 2014-07-15 DIAGNOSIS — M6281 Muscle weakness (generalized): Secondary | ICD-10-CM | POA: Diagnosis not present

## 2014-07-15 DIAGNOSIS — M25519 Pain in unspecified shoulder: Secondary | ICD-10-CM | POA: Diagnosis not present

## 2014-07-19 DIAGNOSIS — M25519 Pain in unspecified shoulder: Secondary | ICD-10-CM | POA: Diagnosis not present

## 2014-07-19 DIAGNOSIS — M6281 Muscle weakness (generalized): Secondary | ICD-10-CM | POA: Diagnosis not present

## 2014-07-19 DIAGNOSIS — M25619 Stiffness of unspecified shoulder, not elsewhere classified: Secondary | ICD-10-CM | POA: Diagnosis not present

## 2014-07-21 DIAGNOSIS — M25519 Pain in unspecified shoulder: Secondary | ICD-10-CM | POA: Diagnosis not present

## 2014-07-21 DIAGNOSIS — M6281 Muscle weakness (generalized): Secondary | ICD-10-CM | POA: Diagnosis not present

## 2014-07-21 DIAGNOSIS — M25619 Stiffness of unspecified shoulder, not elsewhere classified: Secondary | ICD-10-CM | POA: Diagnosis not present

## 2014-07-29 DIAGNOSIS — M25519 Pain in unspecified shoulder: Secondary | ICD-10-CM | POA: Diagnosis not present

## 2014-07-29 DIAGNOSIS — M6281 Muscle weakness (generalized): Secondary | ICD-10-CM | POA: Diagnosis not present

## 2014-07-29 DIAGNOSIS — M25619 Stiffness of unspecified shoulder, not elsewhere classified: Secondary | ICD-10-CM | POA: Diagnosis not present

## 2014-09-09 DIAGNOSIS — M545 Low back pain: Secondary | ICD-10-CM | POA: Diagnosis not present

## 2014-09-09 DIAGNOSIS — M5416 Radiculopathy, lumbar region: Secondary | ICD-10-CM | POA: Diagnosis not present

## 2014-09-09 DIAGNOSIS — M5116 Intervertebral disc disorders with radiculopathy, lumbar region: Secondary | ICD-10-CM | POA: Diagnosis not present

## 2014-09-16 DIAGNOSIS — Q7649 Other congenital malformations of spine, not associated with scoliosis: Secondary | ICD-10-CM | POA: Diagnosis not present

## 2014-09-16 DIAGNOSIS — M545 Low back pain: Secondary | ICD-10-CM | POA: Diagnosis not present

## 2014-09-16 DIAGNOSIS — M5126 Other intervertebral disc displacement, lumbar region: Secondary | ICD-10-CM | POA: Diagnosis not present

## 2014-10-05 DIAGNOSIS — M5116 Intervertebral disc disorders with radiculopathy, lumbar region: Secondary | ICD-10-CM | POA: Diagnosis not present

## 2014-10-05 DIAGNOSIS — M6281 Muscle weakness (generalized): Secondary | ICD-10-CM | POA: Diagnosis not present

## 2014-10-05 DIAGNOSIS — M545 Low back pain: Secondary | ICD-10-CM | POA: Diagnosis not present

## 2014-10-06 DIAGNOSIS — M5116 Intervertebral disc disorders with radiculopathy, lumbar region: Secondary | ICD-10-CM | POA: Diagnosis not present

## 2014-10-06 DIAGNOSIS — M545 Low back pain: Secondary | ICD-10-CM | POA: Diagnosis not present

## 2014-10-06 DIAGNOSIS — M6281 Muscle weakness (generalized): Secondary | ICD-10-CM | POA: Diagnosis not present

## 2014-10-11 DIAGNOSIS — M5116 Intervertebral disc disorders with radiculopathy, lumbar region: Secondary | ICD-10-CM | POA: Diagnosis not present

## 2014-10-11 DIAGNOSIS — M545 Low back pain: Secondary | ICD-10-CM | POA: Diagnosis not present

## 2014-10-11 DIAGNOSIS — M6281 Muscle weakness (generalized): Secondary | ICD-10-CM | POA: Diagnosis not present

## 2014-10-13 DIAGNOSIS — M6281 Muscle weakness (generalized): Secondary | ICD-10-CM | POA: Diagnosis not present

## 2014-10-13 DIAGNOSIS — M5116 Intervertebral disc disorders with radiculopathy, lumbar region: Secondary | ICD-10-CM | POA: Diagnosis not present

## 2014-10-13 DIAGNOSIS — M545 Low back pain: Secondary | ICD-10-CM | POA: Diagnosis not present

## 2014-12-02 DIAGNOSIS — I10 Essential (primary) hypertension: Secondary | ICD-10-CM | POA: Diagnosis not present

## 2014-12-02 DIAGNOSIS — Z6831 Body mass index (BMI) 31.0-31.9, adult: Secondary | ICD-10-CM | POA: Diagnosis not present

## 2014-12-02 DIAGNOSIS — M5126 Other intervertebral disc displacement, lumbar region: Secondary | ICD-10-CM | POA: Diagnosis not present

## 2015-06-02 DIAGNOSIS — E785 Hyperlipidemia, unspecified: Secondary | ICD-10-CM | POA: Diagnosis not present

## 2015-06-02 DIAGNOSIS — I10 Essential (primary) hypertension: Secondary | ICD-10-CM | POA: Diagnosis not present

## 2015-06-02 DIAGNOSIS — Z794 Long term (current) use of insulin: Secondary | ICD-10-CM | POA: Diagnosis not present

## 2015-06-02 DIAGNOSIS — M25519 Pain in unspecified shoulder: Secondary | ICD-10-CM | POA: Diagnosis not present

## 2015-06-02 DIAGNOSIS — E119 Type 2 diabetes mellitus without complications: Secondary | ICD-10-CM | POA: Diagnosis not present

## 2015-06-13 DIAGNOSIS — M25511 Pain in right shoulder: Secondary | ICD-10-CM | POA: Diagnosis not present

## 2015-06-13 DIAGNOSIS — M542 Cervicalgia: Secondary | ICD-10-CM | POA: Diagnosis not present

## 2015-06-22 DIAGNOSIS — M25511 Pain in right shoulder: Secondary | ICD-10-CM | POA: Diagnosis not present

## 2015-06-28 DIAGNOSIS — E119 Type 2 diabetes mellitus without complications: Secondary | ICD-10-CM | POA: Diagnosis not present

## 2015-06-28 DIAGNOSIS — E782 Mixed hyperlipidemia: Secondary | ICD-10-CM | POA: Diagnosis not present

## 2015-06-28 DIAGNOSIS — Z794 Long term (current) use of insulin: Secondary | ICD-10-CM | POA: Diagnosis not present

## 2015-06-28 DIAGNOSIS — I10 Essential (primary) hypertension: Secondary | ICD-10-CM | POA: Diagnosis not present

## 2015-06-29 DIAGNOSIS — E1165 Type 2 diabetes mellitus with hyperglycemia: Secondary | ICD-10-CM | POA: Diagnosis not present

## 2015-06-29 DIAGNOSIS — Z6830 Body mass index (BMI) 30.0-30.9, adult: Secondary | ICD-10-CM | POA: Diagnosis not present

## 2015-06-29 DIAGNOSIS — Z794 Long term (current) use of insulin: Secondary | ICD-10-CM | POA: Diagnosis not present

## 2015-06-29 DIAGNOSIS — E669 Obesity, unspecified: Secondary | ICD-10-CM | POA: Diagnosis not present

## 2015-07-27 DIAGNOSIS — M542 Cervicalgia: Secondary | ICD-10-CM | POA: Diagnosis not present

## 2015-07-27 DIAGNOSIS — M25511 Pain in right shoulder: Secondary | ICD-10-CM | POA: Diagnosis not present

## 2015-07-27 DIAGNOSIS — M545 Low back pain: Secondary | ICD-10-CM | POA: Diagnosis not present

## 2015-08-04 DIAGNOSIS — M5412 Radiculopathy, cervical region: Secondary | ICD-10-CM | POA: Diagnosis not present

## 2015-08-04 DIAGNOSIS — M5126 Other intervertebral disc displacement, lumbar region: Secondary | ICD-10-CM | POA: Diagnosis not present

## 2015-08-04 DIAGNOSIS — Z6832 Body mass index (BMI) 32.0-32.9, adult: Secondary | ICD-10-CM | POA: Diagnosis not present

## 2015-08-04 DIAGNOSIS — I10 Essential (primary) hypertension: Secondary | ICD-10-CM | POA: Diagnosis not present

## 2015-08-15 DIAGNOSIS — M5126 Other intervertebral disc displacement, lumbar region: Secondary | ICD-10-CM | POA: Diagnosis not present

## 2015-08-19 DIAGNOSIS — M5126 Other intervertebral disc displacement, lumbar region: Secondary | ICD-10-CM | POA: Diagnosis not present

## 2015-08-19 DIAGNOSIS — M5412 Radiculopathy, cervical region: Secondary | ICD-10-CM | POA: Diagnosis not present

## 2015-08-19 DIAGNOSIS — M5022 Other cervical disc displacement, mid-cervical region: Secondary | ICD-10-CM | POA: Diagnosis not present

## 2015-08-19 DIAGNOSIS — M4806 Spinal stenosis, lumbar region: Secondary | ICD-10-CM | POA: Diagnosis not present

## 2015-09-08 DIAGNOSIS — I10 Essential (primary) hypertension: Secondary | ICD-10-CM | POA: Diagnosis not present

## 2015-09-08 DIAGNOSIS — Z6831 Body mass index (BMI) 31.0-31.9, adult: Secondary | ICD-10-CM | POA: Diagnosis not present

## 2015-09-08 DIAGNOSIS — M5126 Other intervertebral disc displacement, lumbar region: Secondary | ICD-10-CM | POA: Diagnosis not present

## 2015-09-12 DIAGNOSIS — M25511 Pain in right shoulder: Secondary | ICD-10-CM | POA: Diagnosis not present

## 2015-10-24 DIAGNOSIS — M7541 Impingement syndrome of right shoulder: Secondary | ICD-10-CM | POA: Diagnosis not present

## 2015-10-24 DIAGNOSIS — M25511 Pain in right shoulder: Secondary | ICD-10-CM | POA: Diagnosis not present

## 2015-10-25 DIAGNOSIS — M25511 Pain in right shoulder: Secondary | ICD-10-CM | POA: Diagnosis not present

## 2015-10-25 DIAGNOSIS — M7541 Impingement syndrome of right shoulder: Secondary | ICD-10-CM | POA: Diagnosis not present

## 2015-11-01 DIAGNOSIS — M7541 Impingement syndrome of right shoulder: Secondary | ICD-10-CM | POA: Diagnosis not present

## 2015-11-01 DIAGNOSIS — M25511 Pain in right shoulder: Secondary | ICD-10-CM | POA: Diagnosis not present

## 2015-11-05 ENCOUNTER — Emergency Department (HOSPITAL_COMMUNITY)
Admission: EM | Admit: 2015-11-05 | Discharge: 2015-11-05 | Disposition: A | Discharge status: Home / Self Care | Payer: Medicare Other | Attending: Emergency Medicine | Admitting: Emergency Medicine

## 2015-11-05 ENCOUNTER — Emergency Department (HOSPITAL_COMMUNITY): Payer: Medicare Other

## 2015-11-05 ENCOUNTER — Encounter (HOSPITAL_COMMUNITY): Payer: Self-pay | Admitting: *Deleted

## 2015-11-05 DIAGNOSIS — Y92410 Unspecified street and highway as the place of occurrence of the external cause: Secondary | ICD-10-CM | POA: Insufficient documentation

## 2015-11-05 DIAGNOSIS — R079 Chest pain, unspecified: Secondary | ICD-10-CM | POA: Diagnosis not present

## 2015-11-05 DIAGNOSIS — Z79899 Other long term (current) drug therapy: Secondary | ICD-10-CM | POA: Diagnosis not present

## 2015-11-05 DIAGNOSIS — E119 Type 2 diabetes mellitus without complications: Secondary | ICD-10-CM | POA: Insufficient documentation

## 2015-11-05 DIAGNOSIS — I10 Essential (primary) hypertension: Secondary | ICD-10-CM | POA: Insufficient documentation

## 2015-11-05 DIAGNOSIS — M5489 Other dorsalgia: Secondary | ICD-10-CM | POA: Diagnosis not present

## 2015-11-05 DIAGNOSIS — R0789 Other chest pain: Secondary | ICD-10-CM | POA: Diagnosis not present

## 2015-11-05 DIAGNOSIS — Y9389 Activity, other specified: Secondary | ICD-10-CM | POA: Diagnosis not present

## 2015-11-05 DIAGNOSIS — S299XXA Unspecified injury of thorax, initial encounter: Secondary | ICD-10-CM | POA: Insufficient documentation

## 2015-11-05 DIAGNOSIS — S29001A Unspecified injury of muscle and tendon of front wall of thorax, initial encounter: Secondary | ICD-10-CM | POA: Diagnosis not present

## 2015-11-05 DIAGNOSIS — Y998 Other external cause status: Secondary | ICD-10-CM | POA: Insufficient documentation

## 2015-11-05 MED ORDER — IBUPROFEN 800 MG PO TABS: 800.0000 mg | ORAL_TABLET | Freq: Three times a day (TID) | ORAL | Status: DC

## 2015-11-05 MED ORDER — ACETAMINOPHEN 500 MG PO TABS: 500.0000 mg | ORAL_TABLET | Freq: Four times a day (QID) | ORAL | Status: DC | PRN

## 2015-11-05 MED ORDER — IBUPROFEN 800 MG PO TABS
800.0000 mg | ORAL_TABLET | Freq: Once | ORAL | Status: AC
  Administered 2015-11-05: 800 mg via ORAL
  Filled 2015-11-05: qty 1

## 2015-11-05 MED ORDER — ACETAMINOPHEN 325 MG PO TABS
650.0000 mg | ORAL_TABLET | Freq: Once | ORAL | Status: AC
  Administered 2015-11-05: 650 mg via ORAL
  Filled 2015-11-05: qty 2
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
## 2015-11-05 NOTE — ED Notes (Signed)
PT comfortable with discharge and follow up instructions. Prescriptions x2. 

## 2015-11-05 NOTE — Discharge Instructions (Signed)

## 2015-11-14 DIAGNOSIS — M25511 Pain in right shoulder: Secondary | ICD-10-CM | POA: Diagnosis not present

## 2016-10-09 DIAGNOSIS — M25511 Pain in right shoulder: Secondary | ICD-10-CM | POA: Diagnosis not present

## 2016-10-09 DIAGNOSIS — M5030 Other cervical disc degeneration, unspecified cervical region: Secondary | ICD-10-CM | POA: Diagnosis not present

## 2016-10-09 DIAGNOSIS — M546 Pain in thoracic spine: Secondary | ICD-10-CM | POA: Diagnosis not present

## 2017-02-06 DIAGNOSIS — M25511 Pain in right shoulder: Secondary | ICD-10-CM | POA: Diagnosis not present

## 2017-02-11 DIAGNOSIS — M542 Cervicalgia: Secondary | ICD-10-CM | POA: Diagnosis not present

## 2017-02-19 ENCOUNTER — Encounter: Payer: Self-pay | Admitting: Emergency Medicine

## 2017-02-19 DIAGNOSIS — Z79899 Other long term (current) drug therapy: Secondary | ICD-10-CM | POA: Diagnosis not present

## 2017-02-19 DIAGNOSIS — I10 Essential (primary) hypertension: Secondary | ICD-10-CM | POA: Insufficient documentation

## 2017-02-19 DIAGNOSIS — R739 Hyperglycemia, unspecified: Secondary | ICD-10-CM | POA: Diagnosis not present

## 2017-02-19 DIAGNOSIS — E1065 Type 1 diabetes mellitus with hyperglycemia: Secondary | ICD-10-CM | POA: Diagnosis not present

## 2017-02-19 LAB — CBC
HCT: 46.8 % (ref 40.0–52.0)
Hemoglobin: 15.8 g/dL (ref 13.0–18.0)
MCH: 28 pg (ref 26.0–34.0)
MCHC: 33.7 g/dL (ref 32.0–36.0)
MCV: 83.3 fL (ref 80.0–100.0)
Platelets: 150 10*3/uL (ref 150–440)
RBC: 5.62 MIL/uL (ref 4.40–5.90)
RDW: 13.6 % (ref 11.5–14.5)
WBC: 4.1 10*3/uL (ref 3.8–10.6)

## 2017-02-19 LAB — URINALYSIS, COMPLETE (UACMP) WITH MICROSCOPIC
Bacteria, UA: NONE SEEN
Bilirubin Urine: NEGATIVE
Glucose, UA: >=500 mg/dL — AB
Hgb urine dipstick: NEGATIVE
Ketones, ur: NEGATIVE mg/dL
Leukocytes, UA: NEGATIVE
Nitrite: NEGATIVE
Protein, ur: NEGATIVE mg/dL
RBC / HPF: NONE SEEN RBC/hpf (ref 0–5)
Specific Gravity, Urine: 1.023 (ref 1.005–1.030)
Squamous Epithelial / LPF: NONE SEEN
WBC, UA: NONE SEEN WBC/hpf (ref 0–5)
pH: 6 (ref 5.0–8.0)

## 2017-02-19 LAB — BASIC METABOLIC PANEL
Anion gap: 8 (ref 5–15)
BUN: 15 mg/dL (ref 6–20)
CO2: 30 mmol/L (ref 22–32)
Calcium: 9 mg/dL (ref 8.9–10.3)
Chloride: 92 mmol/L — ABNORMAL LOW (ref 101–111)
Creatinine, Ser: 1.24 mg/dL (ref 0.61–1.24)
GFR calc Af Amer: >60 mL/min (ref >60)
GFR calc non Af Amer: >60 mL/min (ref >60)
Glucose, Bld: 428 mg/dL — ABNORMAL HIGH (ref 65–99)
Potassium: 3.3 mmol/L — ABNORMAL LOW (ref 3.5–5.1)
Sodium: 130 mmol/L — ABNORMAL LOW (ref 135–145)

## 2017-02-19 NOTE — ED Triage Notes (Signed)
Pt arrived to the ED referred from [facility] for not feeling well and being without insulin for 1 week now. Pt states that he has a "little bit of diarrhea" but no other symptoms. Pt is AOx4 in no apparent distress.

## 2017-02-20 ENCOUNTER — Telehealth: Payer: Self-pay | Admitting: Emergency Medicine

## 2017-02-20 ENCOUNTER — Emergency Department
Admission: EM | Admit: 2017-02-20 | Discharge: 2017-02-20 | Disposition: A | Discharge status: Home / Self Care | Payer: Medicare Other | Attending: Emergency Medicine | Admitting: Emergency Medicine

## 2017-02-20 DIAGNOSIS — R739 Hyperglycemia, unspecified: Secondary | ICD-10-CM

## 2017-02-20 LAB — GLUCOSE, CAPILLARY: Glucose-Capillary: 292 mg/dL — ABNORMAL HIGH (ref 65–99)

## 2017-02-20 MED ORDER — LOSARTAN POTASSIUM-HCTZ 100-25 MG PO TABS: 1.0000 | ORAL_TABLET | Freq: Every day | ORAL | 3 refills | Status: DC

## 2017-02-20 MED ORDER — INSULIN ASPART 100 UNIT/ML ~~LOC~~ SOLN
8.0000 [IU] | Freq: Once | SUBCUTANEOUS | Status: AC
Start: 2017-02-20 — End: 2017-02-20
  Administered 2017-02-20: 8 [IU] via SUBCUTANEOUS
  Filled 2017-02-20: qty 8

## 2017-02-20 MED ORDER — AMLODIPINE BESYLATE 10 MG PO TABS: 10.0000 mg | ORAL_TABLET | Freq: Every day | ORAL | 0 refills | Status: DC

## 2017-02-20 MED ORDER — INSULIN GLARGINE 100 UNIT/ML ~~LOC~~ SOLN: 30.0000 [IU] | Freq: Every day | SUBCUTANEOUS | 3 refills | Status: DC

## 2017-02-20 MED ORDER — INSULIN ASPART 100 UNIT/ML ~~LOC~~ SOLN: 30.0000 [IU] | Freq: Two times a day (BID) | SUBCUTANEOUS | 3 refills | Status: DC

## 2017-02-20 MED ORDER — SODIUM CHLORIDE 0.9 % IV BOLUS (SEPSIS)
2000.0000 mL | Freq: Once | INTRAVENOUS | Status: AC
  Administered 2017-02-20: 2000 mL via INTRAVENOUS

## 2017-02-20 NOTE — Telephone Encounter (Signed)
Pharmacy called to clarify amount of aspart novolog insulin flexpen.  The box has 1500 in it and they do not break up the box.  At 30 units twice a day it will last 25 days /box.  Per dr Don Perking, can give the box and the dose stays as dr brown wrote it.

## 2017-02-20 NOTE — ED Provider Notes (Signed)
[facility] Emergency Department Provider Note    First MD Initiated Contact with Patient 02/20/17 0036     (approximate)  I have reviewed the triage vital signs and the nursing notes.   HISTORY  Chief Complaint Blood Sugar Problem   HPI Logan GOVERNALE is a 41 y.o. male low-dose of chronic medical conditions including diabetes presents with hyperglycemia. Patient states that he's been out of his NovoLog for approximately 1 week. Patient states that he try to contact his doctor but however stopped states that he cells and 700 hours and therefore would not see the patient.   Past Medical History:  Diagnosis Date  . Diabetes mellitus   . Hypertension     Patient Active Problem List   Diagnosis Date Noted  . Type I (juvenile type) diabetes mellitus without mention of complication, not stated as uncontrolled 03/17/2013  . Allergic rhinitis, cause unspecified 03/17/2013  . Unspecified essential hypertension 03/17/2013    Past Surgical History:  Procedure Laterality Date  . ANKLE SURGERY     both   . back fusion     l4,l5  . BACK SURGERY    . NASAL SINUS SURGERY      Prior to Admission medications   Medication Sig Start Date End Date Taking? Authorizing Provider  acetaminophen (TYLENOL) 500 MG tablet Take 1 tablet (500 mg total) by mouth every 6 (six) hours as needed. 11/05/15   Cheri Fowler, PA-C  amLODipine (NORVASC) 10 MG tablet Take 1 tablet (10 mg total) by mouth daily. 02/20/17   Darci Current, MD  atorvastatin (LIPITOR) 10 MG tablet Take 10 mg by mouth daily.    Historical Provider, MD  Canagliflozin-Metformin HCl (INVOKAMET) 50-1000 MG TABS Take 1 tablet by mouth 2 (two) times daily.    Historical Provider, MD  ibuprofen (ADVIL,MOTRIN) 800 MG tablet Take 1 tablet (800 mg total) by mouth 3 (three) times daily. 11/05/15   Cheri Fowler, PA-C  insulin aspart (NOVOLOG FLEXPEN) 100 UNIT/ML injection Inject 30 Units into the skin 2 (two) times daily  with a meal. And pen needles 4/day 02/20/17   Darci Current, MD  insulin glargine (LANTUS) 100 UNIT/ML injection Inject 0.3 mLs (30 Units total) into the skin daily. 02/20/17   Darci Current, MD  losartan-hydrochlorothiazide (HYZAAR) 100-25 MG tablet Take 1 tablet by mouth daily. 02/20/17   Darci Current, MD    Allergies Food  History reviewed. No pertinent family history.  Social History Social History  Substance Use Topics  . Smoking status: Never Smoker  . Smokeless tobacco: Never Used  . Alcohol use No    Review of Systems Constitutional: No fever/chills Eyes: No visual changes. ENT: No sore throat. Cardiovascular: Denies chest pain. Respiratory: Denies shortness of breath. Gastrointestinal: No abdominal pain.  No nausea, no vomiting.  No diarrhea.  No constipation. Genitourinary: Negative for dysuria. Musculoskeletal: Negative for back pain. Skin: Negative for rash. Neurological: Negative for headaches, focal weakness or numbness.  10-point ROS otherwise negative.  ____________________________________________   PHYSICAL EXAM:  VITAL SIGNS: ED Triage Vitals [02/19/17 2036]  Enc Vitals Group     BP (!) 154/15     Pulse Rate 77     Resp 18     Temp 98.7 F (37.1 C)     Temp Source Oral     SpO2 99 %     Weight 224 lb (101.6 kg)     Height 6\' 1"  (1.854 m)  Head Circumference      Peak Flow      Pain Score 0     Pain Loc      Pain Edu?      Excl. in GC?     Constitutional: Alert and oriented. Well appearing and in no acute distress. Eyes: Conjunctivae are normal. PERRL. EOMI. Head: Atraumatic. Mouth/Throat: Mucous membranes are moist.  Oropharynx non-erythematous. Neck: No stridor.   Cardiovascular: Normal rate, regular rhythm. Good peripheral circulation. Grossly normal heart sounds. Respiratory: Normal respiratory effort.  No retractions. Lungs CTAB. Gastrointestinal: Soft and nontender. No distention.  Musculoskeletal: No lower extremity  tenderness nor edema. No gross deformities of extremities. Neurologic:  Normal speech and language. No gross focal neurologic deficits are appreciated.  Skin:  Skin is warm, dry and intact. No rash noted. Psychiatric: Mood and affect are normal. Speech and behavior are normal.  ____________________________________________   LABS (all labs ordered are listed, but only abnormal results are displayed)  Labs Reviewed  BASIC METABOLIC PANEL - Abnormal; Notable for the following:       Result Value   Sodium 130 (*)    Potassium 3.3 (*)    Chloride 92 (*)    Glucose, Bld 428 (*)    All other components within normal limits  URINALYSIS, COMPLETE (UACMP) WITH MICROSCOPIC - Abnormal; Notable for the following:    Color, Urine STRAW (*)    APPearance CLEAR (*)    Glucose, UA >=500 (*)    All other components within normal limits  GLUCOSE, CAPILLARY - Abnormal; Notable for the following:    Glucose-Capillary 292 (*)    All other components within normal limits  CBC  CBG MONITORING, ED    Procedures   ____________________________________________   INITIAL IMPRESSION / ASSESSMENT AND PLAN / ED COURSE  Pertinent labs & imaging results that were available during my care of the patient were reviewed by me and considered in my medical decision making (see chart for details).  Patient given 2 L IV normal saline and 8 units of insulin with improvement of blood glucose, glucose 292. Patient prescribed NovoLog 30 units twice a day and Lantus at the doses that the patient states that he normally takes. Patient referred to primary care provider for further outpatient management      ____________________________________________  FINAL CLINICAL IMPRESSION(S) / ED DIAGNOSES  Final diagnoses:  Hyperglycemia     MEDICATIONS GIVEN DURING THIS VISIT:  Medications  sodium chloride 0.9 % bolus 2,000 mL (0 mLs Intravenous Stopped 02/20/17 0227)  insulin aspart (novoLOG) injection 8 Units (8  Units Subcutaneous Given 02/20/17 0128)     NEW OUTPATIENT MEDICATIONS STARTED DURING THIS VISIT:  Current Discharge Medication List      Current Discharge Medication List    CONTINUE these medications which have CHANGED   Details  amLODipine (NORVASC) 10 MG tablet Take 1 tablet (10 mg total) by mouth daily. Qty: 90 tablet, Refills: 0    insulin aspart (NOVOLOG FLEXPEN) 100 UNIT/ML injection Inject 30 Units into the skin 2 (two) times daily with a meal. And pen needles 4/day Qty: 10 mL, Refills: 3    insulin glargine (LANTUS) 100 UNIT/ML injection Inject 0.3 mLs (30 Units total) into the skin daily. Qty: 10 mL, Refills: 3    losartan-hydrochlorothiazide (HYZAAR) 100-25 MG tablet Take 1 tablet by mouth daily. Qty: 30 tablet, Refills: 3        Current Discharge Medication List       Note:  This  document was prepared using Conservation officer, historic buildings and may include unintentional dictation errors.    Darci Current, MD 02/20/17 [phone]

## 2018-02-05 DIAGNOSIS — M1811 Unilateral primary osteoarthritis of first carpometacarpal joint, right hand: Secondary | ICD-10-CM | POA: Diagnosis not present

## 2018-02-05 DIAGNOSIS — M5030 Other cervical disc degeneration, unspecified cervical region: Secondary | ICD-10-CM | POA: Diagnosis not present

## 2018-02-10 DIAGNOSIS — M50122 Cervical disc disorder at C5-C6 level with radiculopathy: Secondary | ICD-10-CM | POA: Diagnosis not present

## 2018-02-10 DIAGNOSIS — M50322 Other cervical disc degeneration at C5-C6 level: Secondary | ICD-10-CM | POA: Diagnosis not present

## 2018-02-10 DIAGNOSIS — M50221 Other cervical disc displacement at C4-C5 level: Secondary | ICD-10-CM | POA: Diagnosis not present

## 2018-02-10 DIAGNOSIS — M542 Cervicalgia: Secondary | ICD-10-CM | POA: Diagnosis not present

## 2018-02-18 DIAGNOSIS — M542 Cervicalgia: Secondary | ICD-10-CM | POA: Diagnosis not present

## 2018-02-18 DIAGNOSIS — M50322 Other cervical disc degeneration at C5-C6 level: Secondary | ICD-10-CM | POA: Diagnosis not present

## 2018-02-18 DIAGNOSIS — M50221 Other cervical disc displacement at C4-C5 level: Secondary | ICD-10-CM | POA: Diagnosis not present

## 2018-02-18 DIAGNOSIS — M50122 Cervical disc disorder at C5-C6 level with radiculopathy: Secondary | ICD-10-CM | POA: Diagnosis not present

## 2018-02-24 DIAGNOSIS — M542 Cervicalgia: Secondary | ICD-10-CM | POA: Diagnosis not present

## 2018-02-24 DIAGNOSIS — M50322 Other cervical disc degeneration at C5-C6 level: Secondary | ICD-10-CM | POA: Diagnosis not present

## 2018-02-24 DIAGNOSIS — M50221 Other cervical disc displacement at C4-C5 level: Secondary | ICD-10-CM | POA: Diagnosis not present

## 2018-02-24 DIAGNOSIS — M50122 Cervical disc disorder at C5-C6 level with radiculopathy: Secondary | ICD-10-CM | POA: Diagnosis not present

## 2018-02-27 DIAGNOSIS — M50322 Other cervical disc degeneration at C5-C6 level: Secondary | ICD-10-CM | POA: Diagnosis not present

## 2018-02-27 DIAGNOSIS — M50221 Other cervical disc displacement at C4-C5 level: Secondary | ICD-10-CM | POA: Diagnosis not present

## 2018-02-27 DIAGNOSIS — M542 Cervicalgia: Secondary | ICD-10-CM | POA: Diagnosis not present

## 2018-02-27 DIAGNOSIS — M50122 Cervical disc disorder at C5-C6 level with radiculopathy: Secondary | ICD-10-CM | POA: Diagnosis not present

## 2018-03-04 DIAGNOSIS — M542 Cervicalgia: Secondary | ICD-10-CM | POA: Diagnosis not present

## 2018-03-04 DIAGNOSIS — M50322 Other cervical disc degeneration at C5-C6 level: Secondary | ICD-10-CM | POA: Diagnosis not present

## 2018-03-04 DIAGNOSIS — M50122 Cervical disc disorder at C5-C6 level with radiculopathy: Secondary | ICD-10-CM | POA: Diagnosis not present

## 2018-03-04 DIAGNOSIS — M50221 Other cervical disc displacement at C4-C5 level: Secondary | ICD-10-CM | POA: Diagnosis not present

## 2018-03-19 DIAGNOSIS — M50122 Cervical disc disorder at C5-C6 level with radiculopathy: Secondary | ICD-10-CM | POA: Diagnosis not present

## 2018-03-24 DIAGNOSIS — M542 Cervicalgia: Secondary | ICD-10-CM | POA: Diagnosis not present

## 2018-05-26 ENCOUNTER — Encounter: Payer: Self-pay | Admitting: Family Medicine

## 2018-05-26 ENCOUNTER — Ambulatory Visit (INDEPENDENT_AMBULATORY_CARE_PROVIDER_SITE_OTHER): Payer: Medicare Other | Admitting: Family Medicine

## 2018-05-26 VITALS — BP 134/102 | HR 88 | Temp 97.8°F | Ht 73.0 in | Wt 197.8 lb

## 2018-05-26 DIAGNOSIS — I10 Essential (primary) hypertension: Secondary | ICD-10-CM | POA: Diagnosis not present

## 2018-05-26 DIAGNOSIS — Z7689 Persons encountering health services in other specified circumstances: Secondary | ICD-10-CM | POA: Diagnosis not present

## 2018-05-26 DIAGNOSIS — E1140 Type 2 diabetes mellitus with diabetic neuropathy, unspecified: Secondary | ICD-10-CM | POA: Diagnosis not present

## 2018-05-26 DIAGNOSIS — E785 Hyperlipidemia, unspecified: Secondary | ICD-10-CM

## 2018-05-26 DIAGNOSIS — Z794 Long term (current) use of insulin: Secondary | ICD-10-CM

## 2018-05-26 LAB — POCT GLYCOSYLATED HEMOGLOBIN (HGB A1C): Hemoglobin A1C: 13.4 % — AB (ref 4.0–5.6)

## 2018-05-26 LAB — COMPREHENSIVE METABOLIC PANEL
ALT: 10 U/L (ref 0–53)
AST: 11 U/L (ref 0–37)
Albumin: 4.5 g/dL (ref 3.5–5.2)
Alkaline Phosphatase: 69 U/L (ref 39–117)
BUN: 14 mg/dL (ref 6–23)
CO2: 30 mEq/L (ref 19–32)
Calcium: 9.4 mg/dL (ref 8.4–10.5)
Chloride: 98 mEq/L (ref 96–112)
Creatinine, Ser: 1.25 mg/dL (ref 0.40–1.50)
GFR: 81.19 mL/min (ref >60.00)
Glucose, Bld: 418 mg/dL — ABNORMAL HIGH (ref 70–99)
Potassium: 4 mEq/L (ref 3.5–5.1)
Sodium: 136 mEq/L (ref 135–145)
Total Bilirubin: 0.8 mg/dL (ref 0.2–1.2)
Total Protein: 7.3 g/dL (ref 6.0–8.3)

## 2018-05-26 LAB — MICROALBUMIN / CREATININE URINE RATIO
Creatinine,U: 54.2 mg/dL
Microalb Creat Ratio: 5.5 mg/g (ref 0.0–30.0)
Microalb, Ur: 3 mg/dL — ABNORMAL HIGH (ref 0.0–1.9)

## 2018-05-26 LAB — CBC WITH DIFFERENTIAL/PLATELET
Basophils Absolute: 0 10*3/uL (ref 0.0–0.1)
Basophils Relative: 0.7 % (ref 0.0–3.0)
Eosinophils Absolute: 0.1 10*3/uL (ref 0.0–0.7)
Eosinophils Relative: 4 % (ref 0.0–5.0)
HCT: 44.9 % (ref 39.0–52.0)
Hemoglobin: 15.3 g/dL (ref 13.0–17.0)
Lymphocytes Relative: 31 % (ref 12.0–46.0)
Lymphs Abs: 0.9 10*3/uL (ref 0.7–4.0)
MCHC: 34 g/dL (ref 30.0–36.0)
MCV: 83.2 fl (ref 78.0–100.0)
Monocytes Absolute: 0.2 10*3/uL (ref 0.1–1.0)
Monocytes Relative: 7 % (ref 3.0–12.0)
Neutro Abs: 1.6 10*3/uL (ref 1.4–7.7)
Neutrophils Relative %: 57.3 % (ref 43.0–77.0)
Platelets: 151 10*3/uL (ref 150.0–400.0)
RBC: 5.4 Mil/uL (ref 4.22–5.81)
RDW: 13.4 % (ref 11.5–15.5)
WBC: 2.8 10*3/uL — ABNORMAL LOW (ref 4.0–10.5)

## 2018-05-26 LAB — LIPID PANEL
Cholesterol: 177 mg/dL (ref 0–200)
HDL: 57 mg/dL (ref >39.00)
LDL Cholesterol: 104 mg/dL — ABNORMAL HIGH (ref 0–99)
NonHDL: 120.16
Total CHOL/HDL Ratio: 3
Triglycerides: 81 mg/dL (ref 0.0–149.0)
VLDL: 16.2 mg/dL (ref 0.0–40.0)

## 2018-05-26 LAB — TSH: TSH: 1.18 u[IU]/mL (ref 0.35–4.50)

## 2018-05-26 MED ORDER — INSULIN GLARGINE 100 UNIT/ML SOLOSTAR PEN: 10.0000 [IU] | PEN_INJECTOR | SUBCUTANEOUS | 99 refills | Status: DC

## 2018-05-26 MED ORDER — AMLODIPINE BESYLATE 10 MG PO TABS: 10.0000 mg | ORAL_TABLET | Freq: Every day | ORAL | 3 refills | Status: DC

## 2018-05-26 MED ORDER — INSULIN PEN NEEDLE 31G X 4 MM MISC: 1.0000 [IU] | 11 refills | Status: DC | PRN

## 2018-05-26 MED ORDER — INSULIN LISPRO 100 UNIT/ML CARTRIDGE: SUBCUTANEOUS | 11 refills | Status: DC

## 2018-05-26 NOTE — Progress Notes (Signed)
Subjective:    Patient ID: [name], male    DOB: Nov 16, 1976, 42 y.o.   MRN: [id number]  HPI This is a 42 yo male who presents today to establish care. Married, has 4 children 17,15,13,9 (3 sons, 1 daughter). Has been disabled for many years. 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   Previously saw an [place] physician and stopped seeing them because he was in collections. Last medical care ? ER visit 02/20/17. Has been busy with his family, children have been evaluated for Marfan's recently (negative w/u so far).   Last CPE- several years ago Tdap- 12/08/2013 Pneumovax 23- 08/31/13 Flu- some years Dental- not regular, recently got dental insurance  Eye- not regular Exercise- regular, coaches basket ball.  Sleep- 6-8 hours, nocturia every 1-2 hours Stress- denies excessive stress, anxiety, sadness Sex- some ED  DM- last meds several months ago. Was diagnosed in 2012. Has recently gotten new glucometer. Last blood sugar 72 last week awoke him from sleep, highest recent reading was 245. Eats regularly. Reviewed note from 2014 from Dr. Everardo All (endocrine) who wrote that patient has type 1 DM. Multiple places in EMR indicate patient taking oral agents (cangliflozin-metformin). I do not see any antibody test results.   HTN- last bp pill yesterday. Not sure what he was taking. Outside medication records and call to patient' pharmacy show prior prescriptions for amlodipine 10 mg and hyzaar 100-25. No recent fills for several months.  No chest pain or SOB, positive for alternating diarrhea/constipation. Chronic back pain, occasional bilateral foot tingling. Muscle spasms in left hand 5th finger. 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   Past Medical History:  Diagnosis Date  . Diabetes mellitus   . Hypertension    Past Surgical History:  Procedure Laterality Date  . ANKLE SURGERY     both   . back fusion     l4,l5  . BACK SURGERY    . NASAL SINUS SURGERY      Family History  Problem Relation Age of Onset  . Hypertension Mother   . Diabetes Father    Social History   Tobacco Use  . Smoking status: Never Smoker  . Smokeless tobacco: Never Used  Substance Use Topics  . Alcohol use: No  . Drug use: No    Review of Systems Per HPI    Objective:   Physical Exam Physical Exam  Constitutional: Oriented to person, place, and time. He appears well-developed and well-nourished.  HENT:  Head: Normocephalic and atraumatic.  Eyes: Conjunctivae are normal.  Neck: Normal range of motion. Neck supple.  Cardiovascular: Normal rate, regular rhythm and normal heart sounds.   Pulmonary/Chest: Effort normal and breath sounds normal.  Musculoskeletal: No edema.  Neurological: Alert and oriented to person, place, and time.  Skin: Skin is warm and dry.  Psychiatric: Normal mood and affect. Behavior is normal. Judgment and thought content normal.  Vitals reviewed.     BP (!) 134/102 (BP Location: Right Arm, Patient Position: Sitting, Cuff Size: Normal)   Pulse 88   Temp 97.8 F (36.6 C) (Oral)   Ht 6\' 1"  (1.854 m)   Wt 197 lb 12 oz (89.7 kg)   SpO2 97%   BMI 26.09 kg/m  Wt Readings from Last 3 Encounters:  05/26/18 197 lb 12 oz (89.7 kg)  02/19/17 224 lb (101.6 kg)  11/05/15 225 lb (102.1 kg)   BP Readings from Last 3 Encounters:  05/26/18 (!) 134/102  02/20/17 (!) 162/96  11/05/15 (!) 139/105   Results for orders placed or performed in visit on 05/26/18  HgB A1c  Result Value Ref Range   Hemoglobin A1C 13.4 (A) 4.0 - 5.6 %   HbA1c POC (<> result, manual entry)  4.0 - 5.6 %   HbA1c, POC (prediabetic range)  5.7 - 6.4 %   HbA1c, POC (controlled diabetic range)  0.0 - 7.0 %      Assessment & Plan:  Discussed insulin prescribing with Dr. Sharen Hones 1. Encounter to establish care - will request records from previous PCP  2. Type 2 diabetes mellitus with diabetic neuropathy, with long-term current use of insulin (HCC) - amb ref  to endocrine - HgB A1c - Comprehensive metabolic panel - CBC with Differential - TSH - Lipid Panel - Microalbumin / creatinine urine ratio - Insulin Glargine (LANTUS SOLOSTAR) 100 UNIT/ML Solostar Pen; Inject 10 Units into the skin every morning.  Dispense: 5 pen; Refill: PRN - insulin lispro (HUMALOG) 100 UNIT/ML cartridge; Inject just prior to meals according to sliding scale  Dispense: 15 mL; Refill: 11 - Insulin Pen Needle 31G X 4 MM MISC; 1 Units by Does not apply route as needed.  Dispense: 100 each; Refill: 11 - I have asked him to check his blood sugars before meals, fasting and at bedtime for next week and bring in readings at follow up in 1 week - encouraged annual eye exam and regular dental care  3. Essential hypertension - amLODipine (NORVASC) 10 MG tablet; Take 1 tablet (10 mg total) by mouth daily.  Dispense: 90 tablet; Refill: 3   Logan Ree, FNP-BC  [place] [facility], [facility]  05/26/2018 8:25 PM

## 2018-05-26 NOTE — Patient Instructions (Addendum)
Good to see you today  I have sent Lantus insulin to your pharmacy, please take 10 units with breakfast  I have sent Humalog insulin to your pharmacy, please check your blood sugar before meals and take as follows  If blood sugar less than 200- no Humalog  200-249- 2 units  250-299- 4 units  300-349- 6 units  If less than 70 or over 300, please let me know  Check your blood sugars 5 times a day until your come back into the office, fasting, before meals, at bedtime. Bring log with you.   Follow up in 1 week

## 2018-05-28 ENCOUNTER — Telehealth: Payer: Self-pay | Admitting: Family Medicine

## 2018-05-28 NOTE — Telephone Encounter (Signed)
Copied from CRM [id number]. Topic: Quick Communication - See Telephone Encounter >> May 28, 2018  3:28 PM Lorrine Kin, NT wrote: CRM for notification. See Telephone encounter for: 05/28/18. [facility] pharmacy calling to verify if the insulin lispro (HUMALOG) 100 UNIT/ML cartridge was supposed to be for the flex pen or not? Please advise. CB#: [phone]

## 2018-05-29 NOTE — Telephone Encounter (Signed)
Yes flex pen if available on patient's preferred plan. If there is a less expensive alternative in the Humalog, he can have that.

## 2018-05-29 NOTE — Telephone Encounter (Signed)
Called and spoke with Miss at [facility] informing her of request. Nothing further needed at this time.

## 2018-05-30 MED ORDER — ATORVASTATIN CALCIUM 20 MG PO TABS: 20.0000 mg | ORAL_TABLET | Freq: Every day | ORAL | 3 refills | Status: DC

## 2018-05-30 NOTE — Addendum Note (Signed)
Addended by: Olean Ree B on: 05/30/2018 07:56 AM   Modules accepted: Orders

## 2018-06-02 ENCOUNTER — Ambulatory Visit: Payer: Self-pay | Admitting: Family Medicine

## 2018-06-02 ENCOUNTER — Other Ambulatory Visit: Payer: Self-pay | Admitting: Family Medicine

## 2018-06-02 MED ORDER — INSULIN NPH ISOPHANE & REGULAR (70-30) 100 UNIT/ML ~~LOC~~ SUSP: 15.0000 [IU] | Freq: Two times a day (BID) | SUBCUTANEOUS | 11 refills | Status: DC

## 2018-06-02 MED ORDER — "INSULIN SYRINGE/NEEDLE 27G X 1/2"" 0.5 ML MISC": 1.0000 [IU] | 5 refills | Status: DC | PRN

## 2018-06-02 NOTE — Telephone Encounter (Signed)
Please call patient and let him know that I have sent in Relion 70/30 insulin and needles. He will take 15 units with breakfast and dinner. Please check blood sugars before giving, in morning fasting and bring readings to next visit. Needs follow up in 2 weeks. If any problems with getting these supplies, please let me know ASAP.

## 2018-06-02 NOTE — Telephone Encounter (Signed)
Pt went to the pharmacy yesterday and they stated they are still waiting to hear from the office and the medication still hasn't been approved. Please advise.

## 2018-06-02 NOTE — Telephone Encounter (Signed)
Called and spoke to Deale with Pharmacy she states that insurance denied both medications and prefer a novalog supplement .

## 2018-06-02 NOTE — Telephone Encounter (Signed)
Called and spoke to patient informing him of request. Understanding verbalized nothing further needed at this time appointment scheduled for 06/23/18 at 11;15AM

## 2018-06-05 DIAGNOSIS — Z794 Long term (current) use of insulin: Secondary | ICD-10-CM | POA: Diagnosis not present

## 2018-06-05 DIAGNOSIS — E785 Hyperlipidemia, unspecified: Secondary | ICD-10-CM | POA: Diagnosis not present

## 2018-06-05 DIAGNOSIS — E1142 Type 2 diabetes mellitus with diabetic polyneuropathy: Secondary | ICD-10-CM | POA: Diagnosis not present

## 2018-06-05 DIAGNOSIS — E1169 Type 2 diabetes mellitus with other specified complication: Secondary | ICD-10-CM | POA: Diagnosis not present

## 2018-06-05 DIAGNOSIS — I10 Essential (primary) hypertension: Secondary | ICD-10-CM | POA: Diagnosis not present

## 2018-06-05 DIAGNOSIS — E1165 Type 2 diabetes mellitus with hyperglycemia: Secondary | ICD-10-CM | POA: Diagnosis not present

## 2018-06-23 ENCOUNTER — Ambulatory Visit (INDEPENDENT_AMBULATORY_CARE_PROVIDER_SITE_OTHER): Payer: Medicare Other | Admitting: Family Medicine

## 2018-06-23 ENCOUNTER — Encounter: Payer: Self-pay | Admitting: Family Medicine

## 2018-06-23 VITALS — BP 142/100 | HR 79 | Temp 98.3°F | Ht 73.0 in | Wt 203.0 lb

## 2018-06-23 DIAGNOSIS — Z23 Encounter for immunization: Secondary | ICD-10-CM | POA: Diagnosis not present

## 2018-06-23 DIAGNOSIS — I10 Essential (primary) hypertension: Secondary | ICD-10-CM

## 2018-06-23 DIAGNOSIS — E1140 Type 2 diabetes mellitus with diabetic neuropathy, unspecified: Secondary | ICD-10-CM | POA: Diagnosis not present

## 2018-06-23 DIAGNOSIS — B350 Tinea barbae and tinea capitis: Secondary | ICD-10-CM | POA: Diagnosis not present

## 2018-06-23 DIAGNOSIS — Z794 Long term (current) use of insulin: Secondary | ICD-10-CM | POA: Diagnosis not present

## 2018-06-23 NOTE — Patient Instructions (Addendum)
Try an over the counter shampoo such as Nizoral, T-gel for scalp and beard. If not better in 2 weeks, let me know.   Please see one of the referrals coordinators to schedule your eye exam  Follow up in 3-4 months

## 2018-06-23 NOTE — Progress Notes (Signed)
Subjective:    Patient ID: [name], male    DOB: [birth year], 42 y.o.   MRN: [id number]  HPI This is a 42 yo male who presents today for follow up of diabetes, foot pain, rash of head and beard.   Was seen by endocrinology 06/05/18.  Novolin 70/30- taking 14 in am and pm, blood sugars 200s. Sometimes feels shaky and jittery and when he checks his blood sugar it is 200s. No low blood sugars.   Foot pain- started on gabapentin which has helped some, also takes ibuprofen 400 mg qd, some days.  Back and neck pain- sees Dr. Delene Loll at [facility].   Has flaking and itching of scalp and beard for ? Several months. Has not tried anything.   Past Medical History:  Diagnosis Date  . Diabetes mellitus   . Hypertension    Past Surgical History:  Procedure Laterality Date  . ANKLE SURGERY     both   . back fusion     l4,l5  . BACK SURGERY    . NASAL SINUS SURGERY     Family History  Problem Relation Age of Onset  . Hypertension Mother   . Diabetes Father    Social History   Tobacco Use  . Smoking status: Never Smoker  . Smokeless tobacco: Never Used  Substance Use Topics  . Alcohol use: No  . Drug use: No      Review of Systems Per HPI    Objective:   Physical Exam Physical Exam  Constitutional: Oriented to person, place, and time. He appears well-developed and well-nourished.  HENT:  Head: Normocephalic and atraumatic. Scalp with patchy areas flaking.  Eyes: Conjunctivae are normal.  Neck: Normal range of motion. Neck supple.  Cardiovascular: Normal rate, regular rhythm and normal heart sounds.   Pulmonary/Chest: Effort normal and breath sounds normal.  Musculoskeletal: Normal range of motion.  Neurological: Alert and oriented to person, place, and time.  Skin: Skin is warm and dry.  Psychiatric: Normal mood and affect. Behavior is normal. Judgment and thought content normal.  Vitals reviewed.  Diabetic foot exam: Normal inspection No skin  breakdown No calluses  Normal DP/PT pulses Normal sensation to light touch, vibration and monofilament Nails normal    BP (!) 142/100 (BP Location: Right Arm, Patient Position: Sitting, Cuff Size: Normal)   Pulse 79   Temp 98.3 F (36.8 C) (Oral)   Ht 6\' 1"  (1.854 m)   Wt 203 lb (92.1 kg)   SpO2 98%   BMI 26.78 kg/m  Wt Readings from Last 3 Encounters:  06/23/18 203 lb (92.1 kg)  05/26/18 197 lb 12 oz (89.7 kg)  02/19/17 224 lb (101.6 kg)   BP Readings from Last 3 Encounters:  06/23/18 (!) 142/100  05/26/18 (!) 134/102  02/20/17 (!) 162/96       Assessment & Plan:  1. Type 2 diabetes mellitus with diabetic neuropathy, with long-term current use of insulin (HCC) - Ambulatory referral to Ophthalmology - Pneumococcal polysaccharide vaccine 23-valent greater than or equal to 2yo subcutaneous/IM - continued endocrine follow up  2. Essential hypertension - increase lisinopril to 30 mg- can take 1.5 of current supply, new order for 30 mg tablets sent to patient's pharmacy.  - Ambulatory referral to Ophthalmology - Pneumococcal polysaccharide vaccine 23-valent greater than or equal to 2yo subcutaneous/IM - Has follow up in next couple of weeks with endocrine  3. Need for pneumococcal vaccination - Pneumococcal polysaccharide vaccine 23-valent greater than or  equal to 2yo subcutaneous/IM  4. Tinea capitis - Provided written and verbal information regarding diagnosis and treatment. - suggested he use OTC shampoo such as T-Gel or Nizoral  - follow up in 3-4 months   Olean Ree, FNP-BC   [facility] at [facility], [facility]  06/24/2018 5:52 AM

## 2018-06-24 ENCOUNTER — Encounter: Payer: Self-pay | Admitting: Family Medicine

## 2018-06-24 MED ORDER — LISINOPRIL 30 MG PO TABS
30.0000 mg | ORAL_TABLET | Freq: Every day | ORAL | 11 refills | Status: DC
Start: 2018-06-24 — End: 2019-04-02

## 2018-07-02 DIAGNOSIS — E119 Type 2 diabetes mellitus without complications: Secondary | ICD-10-CM | POA: Diagnosis not present

## 2018-07-02 DIAGNOSIS — H524 Presbyopia: Secondary | ICD-10-CM | POA: Diagnosis not present

## 2018-07-02 DIAGNOSIS — H5213 Myopia, bilateral: Secondary | ICD-10-CM | POA: Diagnosis not present

## 2018-07-02 LAB — HM DIABETES EYE EXAM

## 2018-07-17 DIAGNOSIS — E1159 Type 2 diabetes mellitus with other circulatory complications: Secondary | ICD-10-CM | POA: Diagnosis not present

## 2018-07-17 DIAGNOSIS — I10 Essential (primary) hypertension: Secondary | ICD-10-CM | POA: Diagnosis not present

## 2018-07-17 DIAGNOSIS — E1169 Type 2 diabetes mellitus with other specified complication: Secondary | ICD-10-CM | POA: Diagnosis not present

## 2018-07-17 DIAGNOSIS — Z794 Long term (current) use of insulin: Secondary | ICD-10-CM | POA: Diagnosis not present

## 2018-07-17 DIAGNOSIS — E1165 Type 2 diabetes mellitus with hyperglycemia: Secondary | ICD-10-CM | POA: Diagnosis not present

## 2018-07-17 DIAGNOSIS — E1142 Type 2 diabetes mellitus with diabetic polyneuropathy: Secondary | ICD-10-CM | POA: Diagnosis not present

## 2018-07-17 DIAGNOSIS — E785 Hyperlipidemia, unspecified: Secondary | ICD-10-CM | POA: Diagnosis not present

## 2018-07-21 DIAGNOSIS — E1142 Type 2 diabetes mellitus with diabetic polyneuropathy: Secondary | ICD-10-CM | POA: Insufficient documentation

## 2018-07-21 DIAGNOSIS — E785 Hyperlipidemia, unspecified: Secondary | ICD-10-CM

## 2018-07-21 DIAGNOSIS — E1169 Type 2 diabetes mellitus with other specified complication: Secondary | ICD-10-CM | POA: Insufficient documentation

## 2018-09-16 ENCOUNTER — Ambulatory Visit: Payer: Self-pay | Admitting: Internal Medicine

## 2018-09-16 DIAGNOSIS — Z0289 Encounter for other administrative examinations: Secondary | ICD-10-CM

## 2018-09-16 NOTE — Progress Notes (Deleted)
Subjective:    Patient ID: [name], male    DOB: Jan 04, 1976, 42 y.o.   MRN: [id number]  HPI  Pt presents to the clinic today with c/o nerve pain. He reports this is an ongoing issue, but seems to be getting worse. He is type 1 DM, last A1C 13.4%. He is following with [facility] endocrinology. He is taking Gabapentin as prescribed.  Review of Systems      Past Medical History:  Diagnosis Date  . Diabetes mellitus   . Hypertension     Current Outpatient Medications  Medication Sig Dispense Refill  . gabapentin (NEURONTIN) 300 MG capsule Take by mouth.    . INS SYRINGE/NEEDLE .5CC/27G 27G X 1/2" 0.5 ML MISC 1 Units by Does not apply route as needed. 100 each 5  . insulin NPH-regular Human (NOVOLIN 70/30 RELION) (70-30) 100 UNIT/ML injection Inject 15 Units into the skin 2 (two) times daily with a meal. 10 mL 11  . lisinopril (PRINIVIL,ZESTRIL) 30 MG tablet Take 1 tablet (30 mg total) by mouth daily. 30 tablet 11  . metFORMIN (GLUCOPHAGE-XR) 500 MG 24 hr tablet Take by mouth.     No current facility-administered medications for this visit.     Allergies  Allergen Reactions  . Food     Chicken, eggs and peaches    Family History  Problem Relation Age of Onset  . Hypertension Mother   . Diabetes Father     Social History   Socioeconomic History  . Marital status: Married    Spouse name: Not on file  . Number of children: Not on file  . Years of education: Not on file  . Highest education level: Not on file  Occupational History  . Not on file  Social Needs  . Financial resource strain: Not on file  . Food insecurity:    Worry: Not on file    Inability: Not on file  . Transportation needs:    Medical: Not on file    Non-medical: Not on file  Tobacco Use  . Smoking status: Never Smoker  . Smokeless tobacco: Never Used  Substance and Sexual Activity  . Alcohol use: No  . Drug use: No  . Sexual activity: Yes    Birth control/protection: None    Lifestyle  . Physical activity:    Days per week: Not on file    Minutes per session: Not on file  . Stress: Not on file  Relationships  . Social connections:    Talks on phone: Not on file    Gets together: Not on file    Attends religious service: Not on file    Active member of club or organization: Not on file    Attends meetings of clubs or organizations: Not on file    Relationship status: Not on file  . Intimate partner violence:    Fear of current or ex partner: Not on file    Emotionally abused: Not on file    Physically abused: Not on file    Forced sexual activity: Not on file  Other Topics Concern  . Not on file  Social History Narrative  . Not on file     Constitutional: Denies fever, malaise, fatigue, headache or abrupt weight changes.  HEENT: Denies eye pain, eye redness, ear pain, ringing in the ears, wax buildup, runny nose, nasal congestion, bloody nose, or sore throat. Respiratory: Denies difficulty breathing, shortness of breath, cough or sputum production.   Cardiovascular: Denies chest  pain, chest tightness, palpitations or swelling in the hands or feet.  Gastrointestinal: Denies abdominal pain, bloating, constipation, diarrhea or blood in the stool.  GU: Denies urgency, frequency, pain with urination, burning sensation, blood in urine, odor or discharge. Musculoskeletal: Denies decrease in range of motion, difficulty with gait, muscle pain or joint pain and swelling.  Skin: Denies redness, rashes, lesions or ulcercations.  Neurological: Denies dizziness, difficulty with memory, difficulty with speech or problems with balance and coordination.  Psych: Denies anxiety, depression, SI/HI.  No other specific complaints in a complete review of systems (except as listed in HPI above).  Objective:   Physical Exam        Assessment & Plan:

## 2018-09-17 ENCOUNTER — Ambulatory Visit (INDEPENDENT_AMBULATORY_CARE_PROVIDER_SITE_OTHER): Payer: Medicare Other | Admitting: Family Medicine

## 2018-09-17 VITALS — BP 130/88 | HR 82 | Temp 98.3°F | Ht 73.0 in | Wt 201.0 lb

## 2018-09-17 DIAGNOSIS — N529 Male erectile dysfunction, unspecified: Secondary | ICD-10-CM

## 2018-09-17 DIAGNOSIS — M79672 Pain in left foot: Secondary | ICD-10-CM | POA: Diagnosis not present

## 2018-09-17 DIAGNOSIS — M79671 Pain in right foot: Secondary | ICD-10-CM

## 2018-09-17 DIAGNOSIS — G5793 Unspecified mononeuropathy of bilateral lower limbs: Secondary | ICD-10-CM | POA: Diagnosis not present

## 2018-09-17 MED ORDER — GABAPENTIN 800 MG PO TABS: 800.0000 mg | ORAL_TABLET | Freq: Three times a day (TID) | ORAL | 2 refills | Status: DC

## 2018-09-17 MED ORDER — SILDENAFIL CITRATE 20 MG PO TABS: ORAL_TABLET | ORAL | 1 refills | Status: DC

## 2018-09-17 NOTE — Patient Instructions (Signed)
Good to see you today  For your foot pain- I have put in a referral to podiatry, have sent in prescription for gabapentin 800 mg three times a day, can also try over the counter pain relieving creams like capsacian, lidocaine  For ED- I have sent in a prescription for generic Viagra (sildenefil) that you can take prior to sexual activity  Please follow up in 3 months

## 2018-09-17 NOTE — Progress Notes (Signed)
Subjective:    Patient ID: [name], male    DOB: 07-Dec-1975, 42 y.o.   MRN: [id number]  HPI This is a 42 yo male who presents today with chief complaint of "nerve pain."  Blood sugar has been up and down, some dietary indiscretion yesterday. Taking 70/30, 20 units twice a day. Metformin 1,000 mg in am. Has follow up with endocrinology.  Pain in both feet, stabbing, shooting, awakens him from sleep. Some internal throbbing. Some numbness of little toes, pain in big toe. Arms and legs feel sensitive, prickly.  Symptoms all of the time. Takes gabapentin 600 mg up to TID and sometimes add ibuprofen. No dizziness. Sleeping ok.   ED- normal libido, difficulty obtaining and maintaining an erection. Able to ejaculate. Requests medication.   Past Medical History:  Diagnosis Date  . Diabetes mellitus   . Hypertension    Past Surgical History:  Procedure Laterality Date  . ANKLE SURGERY     both   . back fusion     l4,l5  . BACK SURGERY    . NASAL SINUS SURGERY     Family History  Problem Relation Age of Onset  . Hypertension Mother   . Diabetes Father    Social History   Tobacco Use  . Smoking status: Never Smoker  . Smokeless tobacco: Never Used  Substance Use Topics  . Alcohol use: No  . Drug use: No      Review of Systems Per HPI    Objective:   Physical Exam  Constitutional: He is oriented to person, place, and time. He appears well-developed and well-nourished. No distress.  HENT:  Head: Normocephalic and atraumatic.  Eyes: Conjunctivae are normal.  Neck: Normal range of motion. Neck supple.  Cardiovascular: Normal rate, regular rhythm and normal heart sounds.  Pulmonary/Chest: Effort normal and breath sounds normal.  Musculoskeletal:  Bilateral feet with good ROM flexion and extenison. Tenderness L>R  Medial soles.   Neurological: He is alert and oriented to person, place, and time.  Skin: Skin is warm and dry. He is not diaphoretic.  Psychiatric: He  has a normal mood and affect. His behavior is normal. Judgment and thought content normal.  Vitals reviewed.     BP 130/88 (BP Location: Right Arm, Patient Position: Sitting, Cuff Size: Large)   Pulse 82   Temp 98.3 F (36.8 C) (Oral)   Ht 6\' 1"  (1.854 m)   Wt 201 lb (91.2 kg)   SpO2 97%   BMI 26.52 kg/m  Wt Readings from Last 3 Encounters:  09/17/18 201 lb (91.2 kg)  06/23/18 203 lb (92.1 kg)  05/26/18 197 lb 12 oz (89.7 kg)   Diabetic foot exam: Normal inspection No skin breakdown No calluses  Normal DP pulses Normal sensation to light touch Nails normal     Assessment & Plan:  1. Bilateral foot pain - has had surgery in both feet, continued pain, will refer to podiatry - Ambulatory referral to Podiatry  2. Neuropathic pain of both feet - will try increasing gabapentin, discussed potential side effects - gabapentin (NEURONTIN) 800 MG tablet; Take 1 tablet (800 mg total) by mouth 3 (three) times daily.  Dispense: 90 tablet; Refill: 2  3. Erectile dysfunction, unspecified erectile dysfunction type - sildenafil (REVATIO) 20 MG tablet; Take 1 to 4 tablets 1/2 - 4 hours before sexual activity. Do not take more than 4 tablets in 24 hours.  Dispense: 16 tablet; Refill: 1  - follow up in 3  months, sooner if worsening symptoms  Olean Ree, FNP-BC  [facility] at [facility], [facility]  09/18/2018 10:22 AM

## 2018-09-18 ENCOUNTER — Encounter: Payer: Self-pay | Admitting: Family Medicine

## 2018-09-18 ENCOUNTER — Ambulatory Visit: Payer: Self-pay | Admitting: Internal Medicine

## 2018-09-29 ENCOUNTER — Encounter: Payer: Self-pay | Admitting: Podiatry

## 2018-09-29 ENCOUNTER — Ambulatory Visit (INDEPENDENT_AMBULATORY_CARE_PROVIDER_SITE_OTHER): Payer: Self-pay | Admitting: Podiatry

## 2018-09-29 DIAGNOSIS — Q6650 Congenital pes planus, unspecified foot: Secondary | ICD-10-CM

## 2018-09-29 DIAGNOSIS — E1142 Type 2 diabetes mellitus with diabetic polyneuropathy: Secondary | ICD-10-CM

## 2018-09-29 NOTE — Progress Notes (Signed)
His patient presents to the office for an evaluation and treatment of his painful feet.  This patient presents the office stating that he had a  steg peg placed into his rear foot complex previously.  He now  experiences sharp, radiating pain noted through his feet.  He says he has played soccer and now he experiences sharp shooting pain through his foot.  He previously was treated unsuccessfully with orthoses.    Dx  Congenital pes planus.    Tx.  IE.  Discussed this condition with this patient.  This patient was scheduled on my nail care schedule and needs to be evaluated by one of the surgeons for an evaluation of the previous surgery and future treatment.  He says he has previously been treated by Dr. For A. Tach and was presenting to the office for a second opinion concerning his feet.  This patient was scheduled with an appointment for an evaluation of his feet by  Dr. Essie Christine DPM

## 2018-10-27 DIAGNOSIS — E1142 Type 2 diabetes mellitus with diabetic polyneuropathy: Secondary | ICD-10-CM | POA: Diagnosis not present

## 2018-10-27 DIAGNOSIS — Z794 Long term (current) use of insulin: Secondary | ICD-10-CM | POA: Diagnosis not present

## 2018-10-27 DIAGNOSIS — E1165 Type 2 diabetes mellitus with hyperglycemia: Secondary | ICD-10-CM | POA: Diagnosis not present

## 2018-10-27 DIAGNOSIS — E119 Type 2 diabetes mellitus without complications: Secondary | ICD-10-CM | POA: Diagnosis not present

## 2018-10-28 ENCOUNTER — Ambulatory Visit (INDEPENDENT_AMBULATORY_CARE_PROVIDER_SITE_OTHER): Payer: Medicare Other | Admitting: Podiatry

## 2018-10-28 ENCOUNTER — Ambulatory Visit (INDEPENDENT_AMBULATORY_CARE_PROVIDER_SITE_OTHER): Payer: Medicare Other

## 2018-10-28 ENCOUNTER — Other Ambulatory Visit: Payer: Self-pay | Admitting: Podiatry

## 2018-10-28 ENCOUNTER — Encounter: Payer: Self-pay | Admitting: Podiatry

## 2018-10-28 DIAGNOSIS — E0865 Diabetes mellitus due to underlying condition with hyperglycemia: Secondary | ICD-10-CM

## 2018-10-28 DIAGNOSIS — M79672 Pain in left foot: Principal | ICD-10-CM

## 2018-10-28 DIAGNOSIS — M79671 Pain in right foot: Secondary | ICD-10-CM

## 2018-10-29 NOTE — Progress Notes (Signed)
   HPI: 42 year old male with PMHx of T1DM presenting today with a chief complaint of neuropathy pain in bilateral feet that has been present for the past few years. He reports burning, itching, sharp and stinging pain that is worse laterally. He has been treated by his PCP for the symptoms. He denies modifying factors. Patient is here for further evaluation and treatment.   Past Medical History:  Diagnosis Date  . Diabetes mellitus   . Hypertension      Physical Exam: General: The patient is alert and oriented x3 in no acute distress.  Dermatology: Skin is warm, dry and supple bilateral lower extremities. Negative for open lesions or macerations.  Vascular: Palpable pedal pulses bilaterally. No edema or erythema noted. Capillary refill within normal limits.  Neurological: Epicritic and protective threshold diminished bilaterally.   Musculoskeletal Exam: Range of motion within normal limits to all pedal and ankle joints bilateral. Muscle strength 5/5 in all groups bilateral.   Radiographic Exam:  Normal osseous mineralization. Joint spaces preserved. No fracture/dislocation/boney destruction.    Assessment: 1. DM, uncontrolled with polyneuropathy   Plan of Care:  1. Patient evaluated. X-Rays reviewed.  2. Patient was seen by PCP yesterday and A1C was too high to register.  3. Stressed importance of lowering blood glucose levels.  4. Continue care with PCP.  5. Return to clinic as needed.       Felecia Shelling, DPM [facility]  Dr. Felecia Shelling, DPM    [address] [place], [place] [address]                Office [phone]  Fax [phone]

## 2019-01-26 DIAGNOSIS — Z794 Long term (current) use of insulin: Secondary | ICD-10-CM | POA: Diagnosis not present

## 2019-01-26 DIAGNOSIS — E785 Hyperlipidemia, unspecified: Secondary | ICD-10-CM | POA: Diagnosis not present

## 2019-01-26 DIAGNOSIS — E119 Type 2 diabetes mellitus without complications: Secondary | ICD-10-CM | POA: Diagnosis not present

## 2019-01-26 DIAGNOSIS — E1142 Type 2 diabetes mellitus with diabetic polyneuropathy: Secondary | ICD-10-CM | POA: Diagnosis not present

## 2019-01-26 DIAGNOSIS — E1159 Type 2 diabetes mellitus with other circulatory complications: Secondary | ICD-10-CM | POA: Diagnosis not present

## 2019-01-26 DIAGNOSIS — E1169 Type 2 diabetes mellitus with other specified complication: Secondary | ICD-10-CM | POA: Diagnosis not present

## 2019-01-26 DIAGNOSIS — E1165 Type 2 diabetes mellitus with hyperglycemia: Secondary | ICD-10-CM | POA: Diagnosis not present

## 2019-01-26 DIAGNOSIS — I10 Essential (primary) hypertension: Secondary | ICD-10-CM | POA: Diagnosis not present

## 2019-02-10 ENCOUNTER — Emergency Department
Admission: EM | Admit: 2019-02-10 | Discharge: 2019-02-10 | Disposition: A | Discharge status: Home / Self Care | Payer: Medicare Other | Attending: Emergency Medicine | Admitting: Emergency Medicine

## 2019-02-10 ENCOUNTER — Encounter: Payer: Self-pay | Admitting: Emergency Medicine

## 2019-02-10 ENCOUNTER — Ambulatory Visit: Payer: Self-pay | Admitting: *Deleted

## 2019-02-10 ENCOUNTER — Encounter: Payer: Self-pay | Admitting: *Deleted

## 2019-02-10 ENCOUNTER — Other Ambulatory Visit: Payer: Self-pay

## 2019-02-10 ENCOUNTER — Telehealth (INDEPENDENT_AMBULATORY_CARE_PROVIDER_SITE_OTHER): Payer: Medicare Other | Admitting: Family Medicine

## 2019-02-10 DIAGNOSIS — R059 Cough, unspecified: Secondary | ICD-10-CM

## 2019-02-10 DIAGNOSIS — R05 Cough: Secondary | ICD-10-CM | POA: Insufficient documentation

## 2019-02-10 DIAGNOSIS — Z794 Long term (current) use of insulin: Secondary | ICD-10-CM | POA: Diagnosis not present

## 2019-02-10 DIAGNOSIS — I10 Essential (primary) hypertension: Secondary | ICD-10-CM | POA: Insufficient documentation

## 2019-02-10 DIAGNOSIS — E119 Type 2 diabetes mellitus without complications: Secondary | ICD-10-CM | POA: Diagnosis not present

## 2019-02-10 DIAGNOSIS — J069 Acute upper respiratory infection, unspecified: Secondary | ICD-10-CM | POA: Diagnosis not present

## 2019-02-10 DIAGNOSIS — Z7984 Long term (current) use of oral hypoglycemic drugs: Secondary | ICD-10-CM | POA: Diagnosis not present

## 2019-02-10 DIAGNOSIS — E1140 Type 2 diabetes mellitus with diabetic neuropathy, unspecified: Secondary | ICD-10-CM

## 2019-02-10 DIAGNOSIS — Z79899 Other long term (current) drug therapy: Secondary | ICD-10-CM | POA: Insufficient documentation

## 2019-02-10 DIAGNOSIS — R0789 Other chest pain: Secondary | ICD-10-CM

## 2019-02-10 NOTE — Progress Notes (Signed)
Virtual Visit via Telephone Note  I connected with [name] on 02/10/19 at  3:45 PM EDT by telephone and verified that I am speaking with the correct person using two identifiers.   I discussed the limitations, risks, security and privacy concerns of performing an evaluation and management service by telephone and the availability of in person appointments. I also discussed with the patient that there may be a patient responsible charge related to this service. The patient expressed understanding and agreed to proceed.  Patient location: Home Provider Location: [facility] Participants: Kerby Nora and ZAKHARY ELENA   History of Present Illness:   43 year old male pt with  Type 1 diabetes insulin dependant, poorly controlled at last OV 05/2018, calls reguarding  New onset upper chest/neck pressure, cough and shortness of breath this Am.  Started with mild ST and cough.. progressed to tightness of chest. Mild shortness of breath. Dry cough. No fever.  No ear pain, no facial pain.   He reports he has been using cough medication too 1 hour ago.. helped minimally.  Went to [facility] ED.Marland Kitchen Temp 98.10F, HR 86, RR18 BP 171/94 but had not taken BP medication yet ( has now), 100% O2 Sat.  Nml PO intake, fluids. No emesis, no diarrhea  Post prandial  CBG now 177     1. LOCATION: "Where does it hurt?"       chest 2. RADIATION: "Does the pain go anywhere else?" (e.g., into neck, jaw, arms, back)     Right at the bottom of his throat 3. ONSET: "When did the chest pain begin?" (Minutes, hours or days)      today 4. PATTERN "Does the pain come and go, or has it been constant since it started?"  "Does it get worse with exertion?"      constant 5. DURATION: "How long does it last" (e.g., seconds, minutes, hours)     constant 6. SEVERITY: "How bad is the pain?"  (e.g., Scale 1-10; mild, moderate, or severe)    - MILD (1-3): doesn't interfere with normal activities     - MODERATE  (4-7): interferes with normal activities or awakens from sleep    - SEVERE (8-10): excruciating pain, unable to do any normal activities       nagging 7. CARDIAC RISK FACTORS: "Do you have any history of heart problems or risk factors for heart disease?" (e.g., prior heart attack, angina; high blood pressure, diabetes, being overweight, high cholesterol, smoking, or strong family history of heart disease)     Diabetes, high blood pressure 8. PULMONARY RISK FACTORS: "Do you have any history of lung disease?"  (e.g., blood clots in lung, asthma, emphysema, birth control pills)     no 9. CAUSE: "What do you think is causing the chest pain?"     Not sure discomfort 10. OTHER SYMPTOMS: "Do you have any other symptoms?" (e.g., dizziness, nausea, vomiting, sweating, fever, difficulty breathing, cough)       Shortness of breath, cough   Past Medical History:  Diagnosis Date  . Diabetes mellitus   . Hypertension    01/2019 AT endo A1C AT 8.0   Observations/Objective: No respiratory distress, speaking in complete senstences, A and O x3   Assessment and Plan: Chest pressure, noncardiac Cough: Flu like symptoms: No respiratory distress or red flags.  Unable to test for flu or COVID19  Low risk for  COVID 19 and no current repsiratory distress.    Fluids, rest. Remain  out of work until at least 7 days from onset of symptoms, 3 days fever free and no respiratory complaints.  Given can rule out COVID.Marland Kitchen encouraged home isolation.  DM, poorly controlled followed by ENDO: Stable control at this time. Continue  insulin   HTN: Restart BP medication and follow at home.   Follow Up Instructions:   Follow up as needed .   I discussed the assessment and treatment plan with the patient. The patient was provided an opportunity to ask questions and all were answered. The patient agreed with the plan and demonstrated an understanding of the instructions.   The patient was advised to call back or seek  an in-person evaluation if the symptoms worsen or if the condition fails to improve as anticipated.  I provided 20 minutes of non-face-to-face time during this encounter.   Kerby Nora, MD

## 2019-02-10 NOTE — Telephone Encounter (Signed)
See appt notes for pt. Dr Ermalene Searing to do phone note today at 3:45.

## 2019-02-10 NOTE — ED Notes (Signed)

## 2019-02-10 NOTE — ED Provider Notes (Signed)
[facility] Emergency Department Provider Note  ____________________________________________   None    (approximate)  I have reviewed the triage vital signs and the nursing notes.   HISTORY  Chief Complaint Cough    HPI [name] is a 43 y.o. male presents to the ED with complaint of nonproductive cough and congestion that started last evening.  Patient denies any fever or chills.  There is been no nausea or vomiting.  He denies any travel and denies any known sick contacts or exposures.    Past Medical History:  Diagnosis Date  . Diabetes mellitus   . Hypertension     Patient Active Problem List   Diagnosis Date Noted  . S/P arthroscopy of shoulder 06/01/2014  . Knee pain 06/29/2013  . Pain in joint, shoulder region 06/29/2013  . Type I (juvenile type) diabetes mellitus without mention of complication, not stated as uncontrolled 03/17/2013  . Allergic rhinitis, cause unspecified 03/17/2013  . Unspecified essential hypertension 03/17/2013    Past Surgical History:  Procedure Laterality Date  . ANKLE SURGERY     both   . back fusion     l4,l5  . BACK SURGERY    . NASAL SINUS SURGERY      Prior to Admission medications   Medication Sig Start Date End Date Taking? Authorizing Provider  Blood Glucose Monitoring Suppl (FIFTY50 GLUCOSE METER 2.0) w/Device KIT Use as directed 07/17/18 07/17/19  [provider]  gabapentin (NEURONTIN) 800 MG tablet Take 1 tablet (800 mg total) by mouth 3 (three) times daily. 09/17/18   Elby Beck, FNP  glucose blood (PRECISION QID TEST) test strip Use 1 each (1 strip total) 3 (three) times daily Use as instructed. 07/17/18 07/17/19  [provider]  ibuprofen (ADVIL,MOTRIN) 800 MG tablet TAKE ONE TABLET BY MOUTH EVERY 8 HOURS AS NEEDED FOR UP TO 10 DAYS FOR PAIN 06/28/15   [provider]  INS SYRINGE/NEEDLE .5CC/27G 27G X 1/2" 0.5 ML MISC 1 Units by Does not apply route as  needed. 06/02/18   Elby Beck, FNP  insulin NPH-regular Human (NOVOLIN 70/30 RELION) (70-30) 100 UNIT/ML injection Inject 15 Units into the skin 2 (two) times daily with a meal. 06/02/18   Elby Beck, FNP  lisinopril (PRINIVIL,ZESTRIL) 30 MG tablet Take 1 tablet (30 mg total) by mouth daily. 06/24/18 06/24/19  Elby Beck, FNP  metFORMIN (GLUCOPHAGE-XR) 500 MG 24 hr tablet Take by mouth. 06/05/18 06/05/19  [provider]  sildenafil (REVATIO) 20 MG tablet Take 1 to 4 tablets 1/2 - 4 hours before sexual activity. Do not take more than 4 tablets in 24 hours. 09/17/18   Elby Beck, FNP    Allergies Food  Family History  Problem Relation Age of Onset  . Hypertension Mother   . Diabetes Father     Social History Social History   Tobacco Use  . Smoking status: Never Smoker  . Smokeless tobacco: Never Used  Substance Use Topics  . Alcohol use: No  . Drug use: No    Review of Systems Constitutional: No fever. ENT: Positive nasal congestion, negative ear pain. Cardiovascular: No chest pain. Respiratory: Positive nonproductive cough. Musculoskeletal: Negative for neck pain nor stiffness. Integumentary: Negative for rash. Neurological: No focal weakness nor numbness.   ____________________________________________   PHYSICAL EXAM:  VITAL SIGNS: ED Triage Vitals [02/10/19 1307]  Enc Vitals Group     BP (!) 171/94     Pulse Rate 86  Resp 18     Temp 98.4 F (36.9 C)     Temp Source Oral     SpO2 100 %     Weight 225 lb (102.1 kg)     Height '6\' 1"'$  (1.854 m)     Head Circumference      Peak Flow      Pain Score 0     Pain Loc      Pain Edu?      Excl. in [place]?     Constitutional: Alert and oriented. Generally well appearing and in no acute distress. Eyes: Conjunctivae are normal.  Nose: No nasal congestion or rhinorrhea noted. Neck: No stridor.  No meningeal signs.   Cardiovascular: Grossly normal heart sounds. Respiratory: No  respiratory distress.  Lungs are clear bilaterally.  Minimal cough noted during exam.  Patient is able to answer questions without any difficulties. Musculoskeletal: No lower extremity tenderness nor edema. No gross deformities of extremities. Neurologic:  Normal speech and language. No gross focal neurologic deficits are appreciated.  Skin:  Skin is warm, dry and intact. No rash noted.   ____________________________________________   LABS (all labs ordered are listed, but only abnormal results are displayed)  Labs Reviewed - No data to display  ____________________________________________   RADIOLOGY   Official radiology report(s): No results found.  ____________________________________________    INITIAL IMPRESSION / MDM / ASSESSMENT AND PLAN / ED COURSE  As part of my medical decision making, I reviewed the following data within the [place] Notes from prior ED visits and [place] Controlled Substance Database  Patient presents to the ED with complaint of nonproductive cough and congestion that started last evening without history of fever or chills.  There is no known sick exposures or recent travel.  She was reassured and patient states that he does not need a medication for cough or congestion.  He also states that he does not require a note for work.   Patient's vital signs are stable.  This patient currently does not meet criteria for testing and I explained that in detail to the patient.  The evaluation today is reassuring with no evidence of emergent medical condition that requires further work-up or evaluation or inpatient treatment.  I provided follow-up recommendations and strict return precautions.  The patient understands and agrees with the plan.   ____________________________________________  FINAL CLINICAL IMPRESSION(S) / ED DIAGNOSES  Final diagnoses:  Acute upper respiratory infection        Note:  This document was prepared using  Dragon voice recognition software and may include unintentional dictation errors.   Johnn Hai, PA-C 02/10/19 1453    Schuyler Amor, MD 02/10/19 2027

## 2019-02-10 NOTE — ED Triage Notes (Signed)
Pt presents to ED c/o non-productive cough and congestion starting last night and worsening today. Denies any known sick contacts.

## 2019-02-10 NOTE — Patient Instructions (Signed)
Flu like symptoms: No respiratory distress or red flags.  Unable to test for flu or COVID19  Low risk for  COVID 19 and no current repsiratory distress.    Fluids, rest. Symptomatic care. Can use Mucinex DM.  Recommend out of work until  at least 7 days from onset, 3 days fever free and no respiratory complaints.  Given can rule out COVID.Marland Kitchen encouraged home isolation.

## 2019-02-10 NOTE — Telephone Encounter (Signed)
Iona Hansen called from [facility] requesting to triage this pt  He called with having upper chest discomfort (at the bottom of his throat per pt), coughing and some shortness of breath, multiple symptoms.  They started today and constant. He denies fever or chest pain. No dizziness, nausea or weakness.  He thinks it is because his blood sugar was up to 209 this morning around 2 am. He has not rechecked his blood sugar since then. He is requesting an appointment for today with his provider.  He had gone to the ED at [facility] and was sent to the tent. He stated they wanted to treat him for the cough and cold symptoms. Routing to flow at [facility] for appointment recommendations. Pt has not traveled in the last month. No protocol, pt states no chest pain.     aAnswer Assessment - Initial Assessment Questions 1. LOCATION: "Where does it hurt?"       chest 2. RADIATION: "Does the pain go anywhere else?" (e.g., into neck, jaw, arms, back)     Right at the bottom of his throat 3. ONSET: "When did the chest pain begin?" (Minutes, hours or days)      today 4. PATTERN "Does the pain come and go, or has it been constant since it started?"  "Does it get worse with exertion?"      constant 5. DURATION: "How long does it last" (e.g., seconds, minutes, hours)     constant 6. SEVERITY: "How bad is the pain?"  (e.g., Scale 1-10; mild, moderate, or severe)    - MILD (1-3): doesn't interfere with normal activities     - MODERATE (4-7): interferes with normal activities or awakens from sleep    - SEVERE (8-10): excruciating pain, unable to do any normal activities       nagging 7. CARDIAC RISK FACTORS: "Do you have any history of heart problems or risk factors for heart disease?" (e.g., prior heart attack, angina; high blood pressure, diabetes, being overweight, high cholesterol, smoking, or strong family history of heart disease)     Diabetes, high blood pressure 8. PULMONARY RISK FACTORS: "Do you have  any history of lung disease?"  (e.g., blood clots in lung, asthma, emphysema, birth control pills)     no 9. CAUSE: "What do you think is causing the chest pain?"     Not sure discomfort 10. OTHER SYMPTOMS: "Do you have any other symptoms?" (e.g., dizziness, nausea, vomiting, sweating, fever, difficulty breathing, cough)       Shortness of breath, cough 11. PREGNANCY: "Is there any chance you are pregnant?" "When was your last menstrual period?"       n/a  Protocols used: CHEST PAIN-A-AH

## 2019-02-11 ENCOUNTER — Ambulatory Visit: Payer: Self-pay | Admitting: Family Medicine

## 2019-02-16 ENCOUNTER — Encounter: Payer: Self-pay | Admitting: Family Medicine

## 2019-02-16 ENCOUNTER — Other Ambulatory Visit: Payer: Self-pay

## 2019-02-16 ENCOUNTER — Ambulatory Visit (INDEPENDENT_AMBULATORY_CARE_PROVIDER_SITE_OTHER): Payer: Medicare Other | Admitting: Family Medicine

## 2019-02-16 VITALS — Ht 73.0 in | Wt 222.0 lb

## 2019-02-16 DIAGNOSIS — R202 Paresthesia of skin: Secondary | ICD-10-CM | POA: Diagnosis not present

## 2019-02-16 DIAGNOSIS — N529 Male erectile dysfunction, unspecified: Secondary | ICD-10-CM | POA: Diagnosis not present

## 2019-02-16 DIAGNOSIS — E1140 Type 2 diabetes mellitus with diabetic neuropathy, unspecified: Secondary | ICD-10-CM

## 2019-02-16 DIAGNOSIS — Z794 Long term (current) use of insulin: Secondary | ICD-10-CM

## 2019-02-16 DIAGNOSIS — M79672 Pain in left foot: Secondary | ICD-10-CM

## 2019-02-16 DIAGNOSIS — R200 Anesthesia of skin: Secondary | ICD-10-CM | POA: Diagnosis not present

## 2019-02-16 DIAGNOSIS — M79671 Pain in right foot: Secondary | ICD-10-CM | POA: Diagnosis not present

## 2019-02-16 NOTE — Progress Notes (Signed)
Virtual Visit via Video Note  I connected with [name] on 02/16/19 at 11:15 AM EDT by a video enabled telemedicine application and verified that I am speaking with the correct person using two identifiers.  Patient was at his home and I was in my office.   I discussed the limitations of evaluation and management by telemedicine and the availability of in person appointments. The patient expressed understanding and agreed to proceed.  History of Present Illness: This is a 43 year old patient with significant history of uncontrolled diabetes.  He requests a virtual visit today to discuss continued bilateral foot pain.  He has had this difficulty for several months and has seen podiatry as well as followed up with endocrinology.  He is currently taking 600 mg of gabapentin 3 times daily.  His last hemoglobin A1c was improved to 8 earlier this month.  It was 13.4.  He continues to follow-up with endocrinology.  He reports pain on his entire foot bilaterally with numbness in his toes.  He denies any skin breakdown.  He has not noticed any paleness of his feet but has noticed some purple-red discoloration in the morning if his feet are cold.  This is resolved when he gets up and moves around and puts on socks.  Pain is worse during the day.  He has had some improvement with the increase in gabapentin.  He is unable to wear lace up or dress shoes for long periods of time and is mostly wearing slides.  Cough-he was seen last week with cough.  He reports that this is improved but he continues to have some postnasal drainage.  He has had a long history of sinus problems and was previously on fluticasone but this caused his blood sugars to increase.  He is not currently taking anything for allergy symptoms.  ED-he reports decreased ability in maintaining and achieving an erection. He denies decreased libido. He has tried sildenafil without any improvement.     Observations/Objective: Patient was alert  and answered questions appropriately. He conversed easily without distress.   Assessment and Plan: 1. Type 2 diabetes mellitus with diabetic neuropathy, with long-term current use of insulin (HCC) - Significantly improved hgba1c, continue to follow up with endocrinology - Encouraged continued adherence to medication, diet and exercise regimen, stressed importance of  - Ambulatory referral to Neurology  2. Erectile dysfunction, unspecified erectile dysfunction type - Ambulatory referral to Urology  3. Numbness and tingling of foot - Ambulatory referral to Neurology  4. Bilateral foot pain - Ambulatory referral to Neurology   Olean Ree, FNP-BC  [place] [facility], [facility]  02/16/2019 3:08 PM   Follow Up Instructions:    I discussed the assessment and treatment plan with the patient. The patient was provided an opportunity to ask questions and all were answered. The patient agreed with the plan and demonstrated an understanding of the instructions.   The patient was advised to call back or seek an in-person evaluation if the symptoms worsen or if the condition fails to improve as anticipated.  Emi Belfast, FNP

## 2019-03-03 DIAGNOSIS — R200 Anesthesia of skin: Secondary | ICD-10-CM | POA: Diagnosis not present

## 2019-03-03 DIAGNOSIS — R202 Paresthesia of skin: Secondary | ICD-10-CM | POA: Diagnosis not present

## 2019-03-09 DIAGNOSIS — Z794 Long term (current) use of insulin: Secondary | ICD-10-CM | POA: Diagnosis not present

## 2019-03-09 DIAGNOSIS — E1169 Type 2 diabetes mellitus with other specified complication: Secondary | ICD-10-CM | POA: Diagnosis not present

## 2019-03-09 DIAGNOSIS — I10 Essential (primary) hypertension: Secondary | ICD-10-CM | POA: Diagnosis not present

## 2019-03-09 DIAGNOSIS — E1142 Type 2 diabetes mellitus with diabetic polyneuropathy: Secondary | ICD-10-CM | POA: Diagnosis not present

## 2019-03-09 DIAGNOSIS — E785 Hyperlipidemia, unspecified: Secondary | ICD-10-CM | POA: Diagnosis not present

## 2019-03-09 DIAGNOSIS — E1159 Type 2 diabetes mellitus with other circulatory complications: Secondary | ICD-10-CM | POA: Diagnosis not present

## 2019-03-09 DIAGNOSIS — E1165 Type 2 diabetes mellitus with hyperglycemia: Secondary | ICD-10-CM | POA: Diagnosis not present

## 2019-03-28 ENCOUNTER — Emergency Department: Payer: Medicare Other

## 2019-03-28 ENCOUNTER — Emergency Department: Payer: Medicare Other | Admitting: Anesthesiology

## 2019-03-28 ENCOUNTER — Other Ambulatory Visit: Payer: Self-pay

## 2019-03-28 ENCOUNTER — Encounter
Admission: EM | Disposition: A | Discharge status: Home / Self Care | Payer: Self-pay | Source: Home / Self Care | Attending: Surgery

## 2019-03-28 ENCOUNTER — Inpatient Hospital Stay
Admission: EM | Admit: 2019-03-28 | Discharge: 2019-04-02 | DRG: 853 | Disposition: A | Discharge status: Home / Self Care | Payer: Medicare Other | Attending: Surgery | Admitting: Surgery

## 2019-03-28 ENCOUNTER — Encounter: Payer: Self-pay | Admitting: Emergency Medicine

## 2019-03-28 DIAGNOSIS — E860 Dehydration: Secondary | ICD-10-CM | POA: Diagnosis present

## 2019-03-28 DIAGNOSIS — Z8249 Family history of ischemic heart disease and other diseases of the circulatory system: Secondary | ICD-10-CM

## 2019-03-28 DIAGNOSIS — Z981 Arthrodesis status: Secondary | ICD-10-CM

## 2019-03-28 DIAGNOSIS — J189 Pneumonia, unspecified organism: Secondary | ICD-10-CM | POA: Diagnosis present

## 2019-03-28 DIAGNOSIS — Z20828 Contact with and (suspected) exposure to other viral communicable diseases: Secondary | ICD-10-CM | POA: Diagnosis not present

## 2019-03-28 DIAGNOSIS — K381 Appendicular concretions: Secondary | ICD-10-CM | POA: Diagnosis not present

## 2019-03-28 DIAGNOSIS — E1142 Type 2 diabetes mellitus with diabetic polyneuropathy: Secondary | ICD-10-CM | POA: Diagnosis not present

## 2019-03-28 DIAGNOSIS — K3580 Unspecified acute appendicitis: Secondary | ICD-10-CM

## 2019-03-28 DIAGNOSIS — E1042 Type 1 diabetes mellitus with diabetic polyneuropathy: Secondary | ICD-10-CM | POA: Diagnosis present

## 2019-03-28 DIAGNOSIS — I82409 Acute embolism and thrombosis of unspecified deep veins of unspecified lower extremity: Secondary | ICD-10-CM

## 2019-03-28 DIAGNOSIS — I10 Essential (primary) hypertension: Secondary | ICD-10-CM | POA: Diagnosis present

## 2019-03-28 DIAGNOSIS — K37 Unspecified appendicitis: Secondary | ICD-10-CM | POA: Diagnosis present

## 2019-03-28 DIAGNOSIS — K429 Umbilical hernia without obstruction or gangrene: Secondary | ICD-10-CM | POA: Diagnosis present

## 2019-03-28 DIAGNOSIS — A4151 Sepsis due to Escherichia coli [E. coli]: Secondary | ICD-10-CM | POA: Diagnosis not present

## 2019-03-28 DIAGNOSIS — K35891 Other acute appendicitis without perforation, with gangrene: Secondary | ICD-10-CM | POA: Diagnosis not present

## 2019-03-28 DIAGNOSIS — Z794 Long term (current) use of insulin: Secondary | ICD-10-CM

## 2019-03-28 DIAGNOSIS — Z833 Family history of diabetes mellitus: Secondary | ICD-10-CM

## 2019-03-28 DIAGNOSIS — K353 Acute appendicitis with localized peritonitis, without perforation or gangrene: Secondary | ICD-10-CM | POA: Diagnosis not present

## 2019-03-28 DIAGNOSIS — D696 Thrombocytopenia, unspecified: Secondary | ICD-10-CM | POA: Diagnosis present

## 2019-03-28 DIAGNOSIS — Z03818 Encounter for observation for suspected exposure to other biological agents ruled out: Secondary | ICD-10-CM | POA: Diagnosis not present

## 2019-03-28 HISTORY — PX: LAPAROSCOPIC APPENDECTOMY: SHX408

## 2019-03-28 LAB — COMPREHENSIVE METABOLIC PANEL
ALT: 20 U/L (ref 0–44)
AST: 16 U/L (ref 15–41)
Albumin: 4.2 g/dL (ref 3.5–5.0)
Alkaline Phosphatase: 59 U/L (ref 38–126)
Anion gap: 7 (ref 5–15)
BUN: 19 mg/dL (ref 6–20)
CO2: 29 mmol/L (ref 22–32)
Calcium: 9.2 mg/dL (ref 8.9–10.3)
Chloride: 106 mmol/L (ref 98–111)
Creatinine, Ser: 1.55 mg/dL — ABNORMAL HIGH (ref 0.61–1.24)
GFR calc Af Amer: >60 mL/min (ref >60)
GFR calc non Af Amer: 54 mL/min — ABNORMAL LOW (ref >60)
Glucose, Bld: 146 mg/dL — ABNORMAL HIGH (ref 70–99)
Potassium: 3.9 mmol/L (ref 3.5–5.1)
Sodium: 142 mmol/L (ref 135–145)
Total Bilirubin: 0.8 mg/dL (ref 0.3–1.2)
Total Protein: 7.5 g/dL (ref 6.5–8.1)

## 2019-03-28 LAB — URINALYSIS, COMPLETE (UACMP) WITH MICROSCOPIC
Bacteria, UA: NONE SEEN
Bilirubin Urine: NEGATIVE
Glucose, UA: NEGATIVE mg/dL
Hgb urine dipstick: NEGATIVE
Ketones, ur: NEGATIVE mg/dL
Leukocytes,Ua: NEGATIVE
Nitrite: NEGATIVE
Protein, ur: NEGATIVE mg/dL
Specific Gravity, Urine: 1.015 (ref 1.005–1.030)
pH: 7 (ref 5.0–8.0)

## 2019-03-28 LAB — CBC
HCT: 42.5 % (ref 39.0–52.0)
Hemoglobin: 14.6 g/dL (ref 13.0–17.0)
MCH: 28.3 pg (ref 26.0–34.0)
MCHC: 34.4 g/dL (ref 30.0–36.0)
MCV: 82.5 fL (ref 80.0–100.0)
Platelets: 158 10*3/uL (ref 150–400)
RBC: 5.15 MIL/uL (ref 4.22–5.81)
RDW: 12.4 % (ref 11.5–15.5)
WBC: 8.9 10*3/uL (ref 4.0–10.5)
nRBC: 0 % (ref 0.0–0.2)

## 2019-03-28 LAB — LACTIC ACID, PLASMA: Lactic Acid, Venous: 2.7 mmol/L (ref 0.5–1.9)

## 2019-03-28 LAB — SARS CORONAVIRUS 2 BY RT PCR (HOSPITAL ORDER, PERFORMED IN ~~LOC~~ HOSPITAL LAB): SARS Coronavirus 2: NEGATIVE

## 2019-03-28 LAB — LIPASE, BLOOD: Lipase: 20 U/L (ref 11–51)

## 2019-03-28 LAB — GLUCOSE, CAPILLARY: Glucose-Capillary: 105 mg/dL — ABNORMAL HIGH (ref 70–99)

## 2019-03-28 SURGERY — APPENDECTOMY, LAPAROSCOPIC
Anesthesia: General

## 2019-03-28 MED ORDER — MORPHINE SULFATE (PF) 4 MG/ML IV SOLN
4.0000 mg | Freq: Once | INTRAVENOUS | Status: AC
  Administered 2019-03-28: 4 mg via INTRAVENOUS
  Filled 2019-03-28: qty 1

## 2019-03-28 MED ORDER — FENTANYL CITRATE (PF) 100 MCG/2ML IJ SOLN
INTRAMUSCULAR | Status: DC | PRN
  Administered 2019-03-28: 25 ug via INTRAVENOUS
  Administered 2019-03-28: 50 ug via INTRAVENOUS

## 2019-03-28 MED ORDER — PIPERACILLIN-TAZOBACTAM 3.375 G IVPB 30 MIN
3.3750 g | Freq: Once | INTRAVENOUS | Status: AC
  Administered 2019-03-28: 20:00:00 3.375 g via INTRAVENOUS
  Filled 2019-03-28: qty 50

## 2019-03-28 MED ORDER — SODIUM CHLORIDE 0.9 % IV SOLN
Freq: Once | INTRAVENOUS | Status: AC
  Administered 2019-03-28: 19:00:00 via INTRAVENOUS

## 2019-03-28 MED ORDER — PROPOFOL 10 MG/ML IV BOLUS
INTRAVENOUS | Status: AC
  Filled 2019-03-28: qty 20

## 2019-03-28 MED ORDER — SODIUM CHLORIDE 0.9 % IV SOLN
INTRAVENOUS | Status: DC | PRN
  Administered 2019-03-28: 22:00:00 via INTRAVENOUS

## 2019-03-28 MED ORDER — SUCCINYLCHOLINE CHLORIDE 20 MG/ML IJ SOLN
INTRAMUSCULAR | Status: DC | PRN
  Administered 2019-03-28: 120 mg via INTRAVENOUS

## 2019-03-28 MED ORDER — ONDANSETRON HCL 4 MG/2ML IJ SOLN
INTRAMUSCULAR | Status: DC | PRN
  Administered 2019-03-28: 4 mg via INTRAVENOUS

## 2019-03-28 MED ORDER — MIDAZOLAM HCL 2 MG/2ML IJ SOLN
INTRAMUSCULAR | Status: DC | PRN
  Administered 2019-03-28: 1 mg via INTRAVENOUS

## 2019-03-28 MED ORDER — SODIUM CHLORIDE 0.9 % IV SOLN
INTRAVENOUS | Status: DC | PRN
  Administered 2019-03-28: 20 ug/min via INTRAVENOUS
  Administered 2019-03-28: 30 ug/min via INTRAVENOUS

## 2019-03-28 MED ORDER — PHENYLEPHRINE HCL (PRESSORS) 10 MG/ML IV SOLN
INTRAVENOUS | Status: DC | PRN
  Administered 2019-03-28 (×7): 100 ug via INTRAVENOUS

## 2019-03-28 MED ORDER — SODIUM CHLORIDE 0.9% FLUSH: 3.0000 mL | Freq: Once | INTRAVENOUS | Status: DC

## 2019-03-28 MED ORDER — MIDAZOLAM HCL 2 MG/2ML IJ SOLN
INTRAMUSCULAR | Status: AC
  Filled 2019-03-28: qty 2

## 2019-03-28 MED ORDER — BUPIVACAINE-EPINEPHRINE (PF) 0.5% -1:200000 IJ SOLN
INTRAMUSCULAR | Status: AC
  Filled 2019-03-28: qty 30

## 2019-03-28 MED ORDER — IOHEXOL 300 MG/ML  SOLN
100.0000 mL | Freq: Once | INTRAMUSCULAR | Status: AC | PRN
  Administered 2019-03-28: 19:00:00 100 mL via INTRAVENOUS

## 2019-03-28 MED ORDER — SUCCINYLCHOLINE CHLORIDE 20 MG/ML IJ SOLN
INTRAMUSCULAR | Status: AC
  Filled 2019-03-28: qty 1

## 2019-03-28 MED ORDER — FENTANYL CITRATE (PF) 100 MCG/2ML IJ SOLN
INTRAMUSCULAR | Status: AC
  Filled 2019-03-28: qty 2

## 2019-03-28 MED ORDER — ROCURONIUM BROMIDE 100 MG/10ML IV SOLN
INTRAVENOUS | Status: DC | PRN
  Administered 2019-03-28: 10 mg via INTRAVENOUS
  Administered 2019-03-28: 35 mg via INTRAVENOUS
  Administered 2019-03-28: 5 mg via INTRAVENOUS

## 2019-03-28 MED ORDER — SODIUM CHLORIDE 0.9 % IV SOLN
INTRAVENOUS | Status: DC | PRN
  Administered 2019-03-28 (×2): via INTRAVENOUS

## 2019-03-28 MED ORDER — BUPIVACAINE-EPINEPHRINE 0.5% -1:200000 IJ SOLN
INTRAMUSCULAR | Status: DC | PRN
  Administered 2019-03-28: 20 mL

## 2019-03-28 MED ORDER — ROCURONIUM BROMIDE 50 MG/5ML IV SOLN
INTRAVENOUS | Status: AC
  Filled 2019-03-28: qty 1

## 2019-03-28 MED ORDER — ACETAMINOPHEN 325 MG PO TABS
650.0000 mg | ORAL_TABLET | Freq: Once | ORAL | Status: AC
  Administered 2019-03-28: 19:00:00 650 mg via ORAL
  Filled 2019-03-28: qty 2

## 2019-03-28 MED ORDER — PROPOFOL 10 MG/ML IV BOLUS
INTRAVENOUS | Status: DC | PRN
  Administered 2019-03-28: 160 mg via INTRAVENOUS

## 2019-03-28 MED ORDER — LIDOCAINE HCL (CARDIAC) PF 100 MG/5ML IV SOSY
PREFILLED_SYRINGE | INTRAVENOUS | Status: DC | PRN
  Administered 2019-03-28: 40 mg via INTRAVENOUS

## 2019-03-28 MED ORDER — METOPROLOL TARTRATE 5 MG/5ML IV SOLN
INTRAVENOUS | Status: DC | PRN
  Administered 2019-03-28: 3 mg via INTRAVENOUS

## 2019-03-28 SURGICAL SUPPLY — 41 items
ANCHOR TIS RET SYS 235ML (MISCELLANEOUS) ×2 IMPLANT
APPLIER CLIP LOGIC TI 5 (MISCELLANEOUS) ×2 IMPLANT
BLADE SURG SZ11 CARB STEEL (BLADE) ×2 IMPLANT
CANISTER SUCT 1200ML W/VALVE (MISCELLANEOUS) ×2 IMPLANT
COVER WAND RF STERILE (DRAPES) IMPLANT
CUTTER FLEX LINEAR 45M (STAPLE) ×2 IMPLANT
DERMABOND ADVANCED (GAUZE/BANDAGES/DRESSINGS) ×1
DERMABOND ADVANCED .7 DNX12 (GAUZE/BANDAGES/DRESSINGS) ×1 IMPLANT
ELECT REM PT RETURN 9FT ADLT (ELECTROSURGICAL) ×2
ELECTRODE REM PT RTRN 9FT ADLT (ELECTROSURGICAL) ×1 IMPLANT
ENDOLOOP SUT PDS II  0 18 (SUTURE) ×1
ENDOLOOP SUT PDS II 0 18 (SUTURE) ×1 IMPLANT
GLOVE BIOGEL PI IND STRL 7.0 (GLOVE) ×1 IMPLANT
GLOVE BIOGEL PI INDICATOR 7.0 (GLOVE) ×1
GLOVE SURG SYN 6.5 ES PF (GLOVE) ×2 IMPLANT
GOWN STRL REUS W/ TWL LRG LVL3 (GOWN DISPOSABLE) ×1 IMPLANT
GOWN STRL REUS W/TWL LRG LVL3 (GOWN DISPOSABLE) ×1
GRASPER SUT TROCAR 14GX15 (MISCELLANEOUS) ×2 IMPLANT
HANDLE YANKAUER SUCT BULB TIP (MISCELLANEOUS) ×2 IMPLANT
IRRIGATION STRYKERFLOW (MISCELLANEOUS) IMPLANT
IRRIGATOR STRYKERFLOW (MISCELLANEOUS)
KIT TURNOVER KIT A (KITS) ×2 IMPLANT
LIGASURE LAP MARYLAND 5MM 37CM (ELECTROSURGICAL) IMPLANT
NEEDLE HYPO 22GX1.5 SAFETY (NEEDLE) ×2 IMPLANT
PACK LAP CHOLECYSTECTOMY (MISCELLANEOUS) ×2 IMPLANT
PENCIL ELECTRO HAND CTR (MISCELLANEOUS) ×2 IMPLANT
RELOAD 45 VASCULAR/THIN (ENDOMECHANICALS) ×2 IMPLANT
RELOAD STAPLE TA45 3.5 REG BLU (ENDOMECHANICALS) ×4 IMPLANT
SCISSORS METZENBAUM CVD 33 (INSTRUMENTS) ×2 IMPLANT
SET TUBE SMOKE EVAC HIGH FLOW (TUBING) ×2 IMPLANT
SUT MNCRL AB 4-0 PS2 18 (SUTURE) ×2 IMPLANT
SUT VIC AB 3-0 SH 27 (SUTURE) ×1
SUT VIC AB 3-0 SH 27X BRD (SUTURE) ×1 IMPLANT
SUT VICRYL 0 AB UR-6 (SUTURE) ×4 IMPLANT
SUT VICRYL PLUS ABS 0 54 (SUTURE) ×2 IMPLANT
TRAY FOLEY MTR SLVR 16FR STAT (SET/KITS/TRAYS/PACK) ×2 IMPLANT
TROCAR HASSON CONE 8 XI (INSTRUMENTS) ×1 IMPLANT
TROCAR HASSON CONE XI 8MM (INSTRUMENTS) ×1
TROCAR XCEL 12X100 BLDLESS (ENDOMECHANICALS) ×2 IMPLANT
TROCAR XCEL BLUNT TIP 100MML (ENDOMECHANICALS) IMPLANT
TROCAR XCEL NON-BLD 5MMX100MML (ENDOMECHANICALS) ×4 IMPLANT

## 2019-03-28 NOTE — ED Triage Notes (Signed)
Pt to ED via POV c/o abdominal pain. Pt states that it feels like a hernia. Pt also states that he has neuropathy, so he is having pain all throughout his body. Pt states that he has been receiving treatment for neuropathy over the last month. Pt is on gabapentin and Cymbalta. Pt is in NAD.

## 2019-03-28 NOTE — ED Notes (Signed)
Surgeon at bedside.  

## 2019-03-28 NOTE — ED Notes (Signed)
1st set of cultures obtained at [facility] and 2nd by butterfly at [facility].

## 2019-03-28 NOTE — ED Notes (Signed)
Attempted for 20g Iv at [facility].

## 2019-03-28 NOTE — ED Notes (Addendum)
Pt states he takes BP meds and took them today; denies n/v/d; denies chills/sweats; states HA.

## 2019-03-28 NOTE — Anesthesia Preprocedure Evaluation (Signed)
Anesthesia Evaluation  Patient identified by MRN, date of birth, ID band Patient awake    Reviewed: Allergy & Precautions, NPO status , Patient's Chart, lab work & pertinent test results  Airway Mallampati: II  TM Distance: >3 FB     Dental   Pulmonary neg pulmonary ROS,    Pulmonary exam normal        Cardiovascular hypertension,  Rate:Tachycardia     Neuro/Psych  Neuromuscular disease negative psych ROS   GI/Hepatic Neg liver ROS,   Endo/Other  diabetes, Type 1, Insulin Dependent  Renal/GU negative Renal ROS  negative genitourinary   Musculoskeletal negative musculoskeletal ROS (+)   Abdominal Normal abdominal exam  (+)   Peds negative pediatric ROS (+)  Hematology negative hematology ROS (+)   Anesthesia Other Findings Past Medical History: No date: Diabetes mellitus No date: Hypertension  Reproductive/Obstetrics                             Anesthesia Physical Anesthesia Plan  ASA: III and emergent  Anesthesia Plan: General   Post-op Pain Management:    Induction: Intravenous, Rapid sequence and Cricoid pressure planned  PONV Risk Score and Plan:   Airway Management Planned: Oral ETT  Additional Equipment:   Intra-op Plan:   Post-operative Plan: Extubation in OR  Informed Consent: I have reviewed the patients History and Physical, chart, labs and discussed the procedure including the risks, benefits and alternatives for the proposed anesthesia with the patient or authorized representative who has indicated his/her understanding and acceptance.     Dental advisory given  Plan Discussed with: CRNA and Surgeon  Anesthesia Plan Comments:         Anesthesia Quick Evaluation

## 2019-03-28 NOTE — H&P (Signed)
Subjective:   CC: appendicitis  HPI:  [name] is a 43 y.o. male who is consulted by williams for evaluation of  above cc.  Symptoms were first noted this am.  Pain is sharp, sudden onsent, located in RLQ, not improving at all.  Associated with nothing specific, exacerbated by nothing specfiic     Past Medical History:  has a past medical history of Diabetes mellitus and Hypertension.  Past Surgical History:  has a past surgical history that includes Back surgery; back fusion; Ankle surgery; and Nasal sinus surgery.  Family History: family history includes Diabetes in his father; Hypertension in his mother.  Social History:  reports that he has never smoked. He has never used smokeless tobacco. He reports that he does not drink alcohol or use drugs.  Current Medications:  atorvastatin (LIPITOR) 20 MG tablet Take 20 mg by mouth daily. [provider] Needs Review  Dextrose, Diabetic Use, (RELION GLUCOSE PO) Take by mouth. 3 times daily. [provider] Needs Review  gabapentin (NEURONTIN) 600 MG tablet Take 600 mg by mouth 3 (three) times daily. [provider] Needs Review  ibuprofen (ADVIL,MOTRIN) 200 MG tablet Take 200 mg by mouth every 6 (six) hours as needed. [provider] Needs Review  INS SYRINGE/NEEDLE .5CC/27G 27G X 1/2" 0.5 ML MISC 1 Units by Does not apply route as needed. Emi Belfast, FNP Needs Review  Insulin NPH Isophane & Regular (NOVOLIN 70/30 RELION [place]) Inject into the skin. 22 u in the am, 22 u at lunch and 20 u in the evening [provider] Needs Review  lisinopril (PRINIVIL,ZESTRIL) 30 MG tablet Take 1 tablet (30 mg total) by mouth daily. Emi Belfast, FNP Needs Review     Allergies:  Allergies as of 03/28/2019 - Review Complete 03/28/2019  Allergen Reaction Noted  . Food  04/26/2014    ROS:  General: Denies weight loss, weight gain, fatigue, fevers, chills, and night sweats. Eyes: Denies blurry  vision, double vision, eye pain, itchy eyes, and tearing. Ears: Denies hearing loss, earache, and ringing in ears. Nose: Denies sinus pain, congestion, infections, runny nose, and nosebleeds. Mouth/throat: Denies hoarseness, sore throat, bleeding gums, and difficulty swallowing. Heart: Denies chest pain, palpitations, racing heart, irregular heartbeat, leg pain or swelling, and decreased activity tolerance. Respiratory: Denies breathing difficulty, shortness of breath, wheezing, cough, and sputum. GI: Denies change in appetite, heartburn, nausea, vomiting, constipation, diarrhea, and blood in stool. GU: Denies difficulty urinating, pain with urinating, urgency, frequency, blood in urine. Musculoskeletal: Denies joint stiffness, pain, swelling, muscle weakness. Skin: Denies rash, itching, mass, tumors, sores, and boils Neurologic: Denies headache, fainting, dizziness, seizures, numbness, and tingling. Psychiatric: Denies depression, anxiety, difficulty sleeping, and memory loss. Endocrine: Denies heat or cold intolerance, and increased thirst or urination. Blood/lymph: Denies easy bruising, easy bruising, and swollen glands     Objective:     BP (!) 171/94 (BP Location: Right Arm)   Pulse (!) 129   Temp (!) 101.9 F (38.8 C) (Oral)   Resp (!) 24   Ht 6\' 1"  (1.854 m)   Wt 102.1 kg   SpO2 97%   BMI 29.69 kg/m    Constitutional :  alert, cooperative, appears stated age and no distress  Lymphatics/Throat:  no asymmetry, masses, or scars  Respiratory:  clear to auscultation bilaterally  Cardiovascular:  regular rate and rhythm  Gastrointestinal: soft, no guarding, but focal tenderness in RLQ.   Musculoskeletal: Steady gait and movement  Skin: Cool  and moist  Psychiatric: Normal affect, non-agitated, not confused       LABS:  CMP Latest Ref Rng & Units 03/28/2019 05/26/2018 02/19/2017  Glucose 70 - 99 mg/dL 989(Q) 119(E) 174(Y)  BUN 6 - 20 mg/dL 19 14 15   Creatinine 0.61 - 1.24 mg/dL  8.14(G) 8.18 5.63  Sodium 135 - 145 mmol/L 142 136 130(L)  Potassium 3.5 - 5.1 mmol/L 3.9 4.0 3.3(L)  Chloride 98 - 111 mmol/L 106 98 92(L)  CO2 22 - 32 mmol/L 29 30 30   Calcium 8.9 - 10.3 mg/dL 9.2 9.4 9.0  Total Protein 6.5 - 8.1 g/dL 7.5 7.3 -  Total Bilirubin 0.3 - 1.2 mg/dL 0.8 0.8 -  Alkaline Phos 38 - 126 U/L 59 69 -  AST 15 - 41 U/L 16 11 -  ALT 0 - 44 U/L 20 10 -   CBC Latest Ref Rng & Units 03/28/2019 05/26/2018 02/19/2017  WBC 4.0 - 10.5 K/uL 8.9 2.8(L) 4.1  Hemoglobin 13.0 - 17.0 g/dL 14.9 70.2 63.7  Hematocrit 39.0 - 52.0 % 42.5 44.9 46.8  Platelets 150 - 400 K/uL 158 151.0 150     RADS: CLINICAL DATA:  Generalized abdominal pain  EXAM: CT ABDOMEN AND PELVIS WITH CONTRAST  TECHNIQUE: Multidetector CT imaging of the abdomen and pelvis was performed using the standard protocol following bolus administration of intravenous contrast.  CONTRAST:  OMNIPAQUE 30  COMPARISON:  None.  FINDINGS: Lower chest: No acute abnormality.  Hepatobiliary: No focal liver abnormality is seen. No gallstones, gallbladder wall thickening, or biliary dilatation.  Pancreas: Unremarkable. No pancreatic ductal dilatation or surrounding inflammatory changes.  Spleen: Normal in size without focal abnormality.  Adrenals/Urinary Tract: Adrenal glands are within normal limits. Kidneys demonstrate no renal calculi or obstructive changes. The bladder is well distended. Delayed images demonstrate normal excretion. Small left renal cyst is noted.  Stomach/Bowel: There are changes consistent with acute appendicitis.  Appendix: Location: Infra cecal  Diameter: 14 mm  Appendicolith: Present centrally measuring approximately 8 mm  Mucosal hyper-enhancement: Mucosal hyperenhancement is seen.  Extraluminal gas: No extraluminal gas is noted.  Periappendiceal collection: No periappendiceal collection is seen.  Colon and small bowel are within normal limits. The stomach  is distended with fluid.  Vascular/Lymphatic: No significant vascular findings are present. No enlarged abdominal or pelvic lymph nodes.  Reproductive: Prostate is unremarkable.  Other: Small umbilical fat containing hernia is seen. No bowel is noted within. No ascites is noted.  Musculoskeletal: Postsurgical and degenerative changes of the lumbar spine are seen. No acute bony abnormality is noted.  IMPRESSION: Changes consistent with acute appendicitis as described above with a central 8 mm appendicoliths.   Electronically Signed   By: Alcide Clever M.D.   On: 03/28/2019 19:44 Assessment:      Acute appendicitis  Plan:      Discussed the risk of surgery including post-op infxn, seroma, hematoma, abscess formation, chronic pain, poor-delayed wound healing, possible bowel resection, possible ostomy, possible conversion to open procedure, post-op SBO or ileus, and need for additional procedures to address said risks.  The risks of general anesthetic including MI, CVA, sudden death or even reaction to anesthetic medications also discussed. Alternatives include continued observation, or antibiotic treatment.  Benefits include possible symptom relief,   Typical post operative recovery of 3-5 days rest, also discussed.  The patient understands the risks, any and all questions were answered to the patient's satisfaction.  He wishes to proceed with urgent lap appy.

## 2019-03-28 NOTE — ED Provider Notes (Signed)
[facility] Emergency Department Provider Note       Time seen: ----------------------------------------- 6:19 PM on 03/28/2019 -----------------------------------------   I have reviewed the triage vital signs and the nursing notes.  HISTORY   Chief Complaint Abdominal Pain    HPI [name] is a 43 y.o. male with a history of diabetes, hypertension who presents to the ED for pain.  Patient states that it feels like a hernia.  He also states he has neuropathy so he is having pain throughout his whole body.  He has been having treatment for neuropathy over the last month and is on gabapentin and Cymbalta.  Patient was noted to be febrile on arrival here, currently having severe right lower quadrant pain.  He is currently having chills, denies any recent chills or fever.  Denies any cough, sore throat, vomiting or diarrhea.  Past Medical History:  Diagnosis Date  . Diabetes mellitus   . Hypertension     Patient Active Problem List   Diagnosis Date Noted  . Chest pressure 02/10/2019  . Cough 02/10/2019  . Diabetic polyneuropathy associated with type 2 diabetes mellitus (HCC) 07/21/2018  . Hyperlipidemia associated with type 2 diabetes mellitus (HCC) 07/21/2018  . S/P arthroscopy of shoulder 06/01/2014  . Knee pain 06/29/2013  . Pain in joint, shoulder region 06/29/2013  . Type I (juvenile type) diabetes mellitus without mention of complication, not stated as uncontrolled 03/17/2013  . Allergic rhinitis, cause unspecified 03/17/2013  . Unspecified essential hypertension 03/17/2013    Past Surgical History:  Procedure Laterality Date  . ANKLE SURGERY     both   . back fusion     l4,l5  . BACK SURGERY    . NASAL SINUS SURGERY      Allergies Food  Social History Social History   Tobacco Use  . Smoking status: Never Smoker  . Smokeless tobacco: Never Used  Substance Use Topics  . Alcohol use: No  . Drug use: No   Review of  Systems Constitutional: Negative for fever. Cardiovascular: Negative for chest pain. Respiratory: Negative for shortness of breath. Gastrointestinal: Positive for abdominal pain Musculoskeletal: Negative for back pain. Skin: Negative for rash. Neurological: Negative for headaches, focal weakness or numbness.  All systems negative/normal/unremarkable except as stated in the HPI  ____________________________________________   PHYSICAL EXAM:  VITAL SIGNS: ED Triage Vitals  Enc Vitals Group     BP 03/28/19 1449 (!) 134/93     Pulse Rate 03/28/19 1449 (!) 116     Resp 03/28/19 1449 16     Temp 03/28/19 1449 99.3 F (37.4 C)     Temp Source 03/28/19 1449 Oral     SpO2 03/28/19 1449 99 %     Weight 03/28/19 1447 225 lb (102.1 kg)     Height 03/28/19 1447 6\' 1"  (1.854 m)     Head Circumference --      Peak Flow --      Pain Score 03/28/19 1447 7     Pain Loc --      Pain Edu? --      Excl. in GC? --    Constitutional: Alert and oriented.  Mild to moderate distress Eyes: Conjunctivae are normal. Normal extraocular movements. ENT      Head: Normocephalic and atraumatic.      Nose: No congestion/rhinnorhea.      Mouth/Throat: Mucous membranes are moist.      Neck: No stridor. Cardiovascular: Rapid rate, regular rhythm. No murmurs, rubs,  or gallops. Respiratory: Normal respiratory effort without tachypnea nor retractions. Breath sounds are clear and equal bilaterally. No wheezes/rales/rhonchi. Gastrointestinal: Severe right lower quadrant tenderness, guarding, hypoactive bowel sounds Musculoskeletal: Nontender with normal range of motion in extremities. No lower extremity tenderness nor edema. Neurologic:  Normal speech and language. No gross focal neurologic deficits are appreciated.  Skin:  Skin is warm, dry and intact. No rash noted. ____________________________________________  ED COURSE:  As part of my medical decision making, I reviewed the following data within the  electronic MEDICAL RECORD NUMBER History obtained from family if available, nursing notes, old chart and ekg, as well as notes from prior ED visits. Patient presented for abdominal pain, we will assess with labs and imaging as indicated at this time.   Procedures  [name] was evaluated in Emergency Department on 03/28/2019 for the symptoms described in the history of present illness. He was evaluated in the context of the global COVID-19 pandemic, which necessitated consideration that the patient might be at risk for infection with the SARS-CoV-2 virus that causes COVID-19. Institutional protocols and algorithms that pertain to the evaluation of patients at risk for COVID-19 are in a state of rapid change based on information released by regulatory bodies including the CDC and federal and state organizations. These policies and algorithms were followed during the patient's care in the ED.  ____________________________________________   LABS (pertinent positives/negatives)  Labs Reviewed  COMPREHENSIVE METABOLIC PANEL - Abnormal; Notable for the following components:      Result Value   Glucose, Bld 146 (*)    Creatinine, Ser 1.55 (*)    GFR calc non Af Amer 54 (*)    All other components within normal limits  URINALYSIS, COMPLETE (UACMP) WITH MICROSCOPIC - Abnormal; Notable for the following components:   Color, Urine YELLOW (*)    APPearance CLEAR (*)    All other components within normal limits  GLUCOSE, CAPILLARY - Abnormal; Notable for the following components:   Glucose-Capillary 105 (*)    All other components within normal limits  CULTURE, BLOOD (ROUTINE X 2)  CULTURE, BLOOD (ROUTINE X 2)  SARS CORONAVIRUS 2 (HOSPITAL ORDER, PERFORMED IN [facility])  LIPASE, BLOOD  CBC  LACTIC ACID, PLASMA    RADIOLOGY Images were viewed by me  CT the abdomen pelvis with contrast IMPRESSION: Changes consistent with acute appendicitis as described above with a central 8 mm  appendicoliths. ____________________________________________   DIFFERENTIAL DIAGNOSIS   Appendicitis, renal colic, peritonitis, sepsis, coronavirus, gastroenteritis  FINAL ASSESSMENT AND PLAN  Acute appendicitis   Plan: The patient had presented for right lower quadrant pain. Patient's labs are surprisingly normal with the exception of some creatinine elevation. Patient's imaging did reveal acute appendicitis with appendicolith.  Initially his vital signs were normal, he subsequently spiked a fever, had shaking chills and tachycardia.  We ordered IV fluids and IV Zosyn.  I have discussed with the surgeon for admission.   Ulice Dash, MD    Note: This note was generated in part or whole with voice recognition software. Voice recognition is usually quite accurate but there are transcription errors that can and very often do occur. I apologize for any typographical errors that were not detected and corrected.     Emily Filbert, MD 03/28/19 [phone]

## 2019-03-28 NOTE — ED Notes (Signed)
Pt suddenly very weak and shaking. BG 105.

## 2019-03-28 NOTE — ED Notes (Signed)
Pt leaving for CT.  

## 2019-03-28 NOTE — ED Notes (Signed)
Pt back from CT

## 2019-03-29 ENCOUNTER — Other Ambulatory Visit: Payer: Self-pay

## 2019-03-29 DIAGNOSIS — K35891 Other acute appendicitis without perforation, with gangrene: Secondary | ICD-10-CM | POA: Diagnosis not present

## 2019-03-29 DIAGNOSIS — K37 Unspecified appendicitis: Secondary | ICD-10-CM | POA: Diagnosis present

## 2019-03-29 LAB — BLOOD CULTURE ID PANEL (REFLEXED)

## 2019-03-29 LAB — GLUCOSE, CAPILLARY
Glucose-Capillary: 130 mg/dL — ABNORMAL HIGH (ref 70–99)
Glucose-Capillary: 138 mg/dL — ABNORMAL HIGH (ref 70–99)
Glucose-Capillary: 145 mg/dL — ABNORMAL HIGH (ref 70–99)
Glucose-Capillary: 151 mg/dL — ABNORMAL HIGH (ref 70–99)
Glucose-Capillary: 152 mg/dL — ABNORMAL HIGH (ref 70–99)
Glucose-Capillary: 190 mg/dL — ABNORMAL HIGH (ref 70–99)

## 2019-03-29 MED ORDER — INSULIN NPH (HUMAN) (ISOPHANE) 100 UNIT/ML ~~LOC~~ SUSP
30.0000 [IU] | Freq: Two times a day (BID) | SUBCUTANEOUS | Status: DC
  Administered 2019-03-29: 18:00:00 20 [IU] via SUBCUTANEOUS
  Filled 2019-03-29: qty 10
  Filled 2019-03-29 (×3): qty 0.3

## 2019-03-29 MED ORDER — ONDANSETRON HCL 4 MG/2ML IJ SOLN: 4.0000 mg | Freq: Once | INTRAMUSCULAR | Status: DC | PRN

## 2019-03-29 MED ORDER — ACETAMINOPHEN 325 MG PO TABS: 650.0000 mg | ORAL_TABLET | Freq: Four times a day (QID) | ORAL | Status: DC | PRN

## 2019-03-29 MED ORDER — MORPHINE SULFATE (PF) 2 MG/ML IV SOLN
1.0000 mg | INTRAVENOUS | Status: DC | PRN
  Administered 2019-03-29 (×2): 1 mg via INTRAVENOUS
  Filled 2019-03-29 (×2): qty 1

## 2019-03-29 MED ORDER — IBUPROFEN 400 MG PO TABS: 600.0000 mg | ORAL_TABLET | Freq: Four times a day (QID) | ORAL | Status: DC | PRN

## 2019-03-29 MED ORDER — ATORVASTATIN CALCIUM 20 MG PO TABS
20.0000 mg | ORAL_TABLET | Freq: Every day | ORAL | Status: DC
  Administered 2019-03-29 – 2019-04-01 (×4): 20 mg via ORAL
  Filled 2019-03-29 (×4): qty 1

## 2019-03-29 MED ORDER — SODIUM CHLORIDE 0.9 % IV SOLN
1.0000 g | Freq: Three times a day (TID) | INTRAVENOUS | Status: DC
  Administered 2019-03-29 – 2019-03-31 (×7): 1 g via INTRAVENOUS
  Filled 2019-03-29 (×10): qty 1

## 2019-03-29 MED ORDER — ONDANSETRON HCL 4 MG/2ML IJ SOLN
4.0000 mg | Freq: Four times a day (QID) | INTRAMUSCULAR | Status: DC | PRN
  Filled 2019-03-29: qty 2

## 2019-03-29 MED ORDER — INSULIN ASPART 100 UNIT/ML ~~LOC~~ SOLN
0.0000 [IU] | Freq: Three times a day (TID) | SUBCUTANEOUS | Status: DC
  Administered 2019-03-30 – 2019-03-31 (×3): 3 [IU] via SUBCUTANEOUS
  Administered 2019-03-31 – 2019-04-01 (×2): 2 [IU] via SUBCUTANEOUS
  Administered 2019-04-01: 08:00:00 3 [IU] via SUBCUTANEOUS
  Administered 2019-04-01: 2 [IU] via SUBCUTANEOUS
  Administered 2019-04-02: 3 [IU] via SUBCUTANEOUS
  Filled 2019-03-29 (×8): qty 1

## 2019-03-29 MED ORDER — HYDROCODONE-ACETAMINOPHEN 5-325 MG PO TABS
1.0000 | ORAL_TABLET | ORAL | Status: DC | PRN
  Administered 2019-03-29 – 2019-03-30 (×4): 2 via ORAL
  Filled 2019-03-29 (×5): qty 2

## 2019-03-29 MED ORDER — LISINOPRIL 20 MG PO TABS
30.0000 mg | ORAL_TABLET | Freq: Every day | ORAL | Status: DC
  Administered 2019-03-29 – 2019-04-01 (×4): 30 mg via ORAL
  Filled 2019-03-29 (×4): qty 1

## 2019-03-29 MED ORDER — BUPIVACAINE HCL (PF) 0.5 % IJ SOLN
INTRAMUSCULAR | Status: AC
  Filled 2019-03-29: qty 10

## 2019-03-29 MED ORDER — GABAPENTIN 600 MG PO TABS
600.0000 mg | ORAL_TABLET | Freq: Three times a day (TID) | ORAL | Status: DC
  Administered 2019-03-29 – 2019-04-01 (×10): 600 mg via ORAL
  Filled 2019-03-29 (×10): qty 1

## 2019-03-29 MED ORDER — LACTATED RINGERS IV SOLN
INTRAVENOUS | Status: DC
  Administered 2019-03-29 – 2019-03-31 (×3): via INTRAVENOUS

## 2019-03-29 MED ORDER — FENTANYL CITRATE (PF) 100 MCG/2ML IJ SOLN: 25.0000 ug | INTRAMUSCULAR | Status: DC | PRN

## 2019-03-29 MED ORDER — ACETAMINOPHEN 650 MG RE SUPP: 650.0000 mg | Freq: Four times a day (QID) | RECTAL | Status: DC | PRN

## 2019-03-29 MED ORDER — SODIUM CHLORIDE 0.9 % IV SOLN
INTRAVENOUS | Status: DC | PRN
  Administered 2019-03-29 – 2019-04-01 (×3): 250 mL via INTRAVENOUS
  Administered 2019-04-01: 500 mL via INTRAVENOUS
  Administered 2019-04-01: 250 mL via INTRAVENOUS

## 2019-03-29 MED ORDER — ONDANSETRON 4 MG PO TBDP
4.0000 mg | ORAL_TABLET | Freq: Four times a day (QID) | ORAL | Status: DC | PRN
  Administered 2019-03-31: 4 mg via ORAL
  Filled 2019-03-29: qty 1

## 2019-03-29 MED ORDER — SUGAMMADEX SODIUM 500 MG/5ML IV SOLN
INTRAVENOUS | Status: DC | PRN
  Administered 2019-03-28: 200 mg via INTRAVENOUS

## 2019-03-29 NOTE — Op Note (Addendum)
Preoperative diagnosis: Acute appendicitis, umbilical hernia  Postoperative diagnosis: same  Procedure: Laparoscopic appendectomy.  Anesthesia: GETA  Surgeon: Sung Amabile  Wound Classification: clean contaminated  Specimen: Appendix  Complications: None  Estimated Blood Loss: 3 mL   Indications: Patient is a 43 y.o. male  presented with right lower quadrant pain.  Computed tomography scan and physical examination were consistent with acute appendicitis.   Findings: 1. Acutely inflamed appendix 2. No peri-appendiceal abscess or phlegmon 3. Normal anatomy 4. Appendiceal artery ligated and divided with EndoGIA 5. Adequate hemostasis.   Description of procedure: The patient was placed on the operating table in the supine position, left arm tucked. General anesthesia was induced. A time-out was completed verifying correct patient, procedure, site, positioning, and implant(s) and/or special equipment prior to beginning this procedure. A Foley catheter placed. The abdomen was prepped and draped in the usual sterile fashion.   After local was infused, an incision was made left to the umbilicus.  The fascia was elevated with Coker's and 0 Vicryl stay sutures were placed on either side of the midline.  The palpable umbilical hernia was then entered using blunt dissection under direct visualization.  Hasson port then placed through this and abdomen insufflated with carbon dioxide to a pressure of 15 mmHg. The patient tolerated insufflation well.  The laparoscope was inserted and the abdomen inspected. No injuries from initial trocar placement were noted. One 71mm port and another 5-mm port was then placed above the symphysis pubis on midline and LLQ.  Care was taken to avoid injury to the bladder or inferior epigastric vessels. The table was placed in the Trendelenburg position with the right side elevated.  An inflamed appendix was identified and elevated.  Window created at base of appendix in  the mesentery.   An endoscopic blue load linear cutting stapler was then used to divide and staple the base of the appendix. It was reloaded with a vascular cartridge and the mesoappendix similarly divided.  Additional mesoappendix divided using another vascular load.  The appendix was placed in an endoscopic retrieval bag and removed.   The appendiceal stump was examined and hemostasis noted.  However, the staple line did not look viable, so a 0 PDS endoloop was placed below the staple line, including part of the healthy cecum, to ensure the staple line does not fall apart due to the unhealthy tissue. Scant serosanguinous fluid and minimal blood suctioned out.  Eviseal placed around the loop to enforce the area. No other pathology was identified within pelvis.  trocars were removed under direct vision. No bleeding was noted. The umbilical trocar removed and umbilical hernia closed with PMI using 0 vicryl under direct vision. The abdomen was allowed to collapse.  Two additional 0 vicryl used on either side of the PMI vicryl to completely close the umbilical defect. Deep dermal then closed with 3-0 vicryl interrupted fashion.  All skin incisions then closed with subcuticular sutures Monocryl 4-0.  Wounds then dressed with dermabond.  The patient tolerated the procedure well, foley removed, awakened from anesthesia and was taken to the postanesthesia care unit in satisfactory condition.  Sponge count and instrument count correct at the end of the procedure.

## 2019-03-29 NOTE — Anesthesia Postprocedure Evaluation (Signed)
Anesthesia Post Note  Patient: [name]  Procedure(s) Performed: APPENDECTOMY LAPAROSCOPIC (N/A )  Patient location during evaluation: PACU Anesthesia Type: General Level of consciousness: awake and alert and oriented Pain management: pain level controlled Vital Signs Assessment: post-procedure vital signs reviewed and stable Respiratory status: spontaneous breathing Cardiovascular status: blood pressure returned to baseline Anesthetic complications: no     Last Vitals:  Vitals:   03/29/19 0151 03/29/19 0507  BP: 124/90 119/85  Pulse: (!) 106 (!) 102  Resp: 20 16  Temp: 37.1 C 37 C  SpO2: 93% 97%    Last Pain:  Vitals:   03/29/19 0828  TempSrc:   PainSc: 4                  Syra Sirmons

## 2019-03-29 NOTE — Plan of Care (Signed)
  Problem: Education: Goal: Required Educational Video(s) Outcome: Progressing   Problem: Clinical Measurements: Goal: Ability to maintain clinical measurements within normal limits will improve Outcome: Progressing Goal: Postoperative complications will be avoided or minimized Outcome: Progressing   Problem: Skin Integrity: Goal: Demonstration of wound healing without infection will improve Outcome: Progressing   Problem: Health Behavior/Discharge Planning: Goal: Ability to manage health-related needs will improve Outcome: Progressing   Problem: Activity: Goal: Risk for activity intolerance will decrease Outcome: Progressing   Problem: Nutrition: Goal: Adequate nutrition will be maintained Outcome: Progressing   Problem: Elimination: Goal: Will not experience complications related to bowel motility Outcome: Progressing Goal: Will not experience complications related to urinary retention Outcome: Progressing   Problem: Pain Managment: Goal: General experience of comfort will improve Outcome: Progressing   Problem: Safety: Goal: Ability to remain free from injury will improve Outcome: Progressing

## 2019-03-29 NOTE — Progress Notes (Signed)
PHARMACY - PHYSICIAN COMMUNICATION CRITICAL VALUE ALERT - BLOOD CULTURE IDENTIFICATION (BCID)  [name] is an 43 y.o. male who presented to [facility] on 03/28/2019 with a chief complaint of acute appendicitis  Name of physician (or Provider) Contacted:Isami Tonna Boehringer Current antibiotics: Zosyn  Changes to prescribed antibiotics recommended: Meropenem 1gm q8h  Results for orders placed or performed during the hospital encounter of 03/28/19  Blood Culture ID Panel (Reflexed) (Collected: 03/28/2019  6:54 PM)  Result Value Ref Range   Enterococcus species NOT DETECTED NOT DETECTED   Listeria monocytogenes NOT DETECTED NOT DETECTED   Staphylococcus species NOT DETECTED NOT DETECTED   Staphylococcus aureus (BCID) NOT DETECTED NOT DETECTED   Streptococcus species NOT DETECTED NOT DETECTED   Streptococcus agalactiae NOT DETECTED NOT DETECTED   Streptococcus pneumoniae NOT DETECTED NOT DETECTED   Streptococcus pyogenes NOT DETECTED NOT DETECTED   Acinetobacter baumannii NOT DETECTED NOT DETECTED   Enterobacteriaceae species DETECTED (A) NOT DETECTED   Enterobacter cloacae complex NOT DETECTED NOT DETECTED   Escherichia coli DETECTED (A) NOT DETECTED   Klebsiella oxytoca NOT DETECTED NOT DETECTED   Klebsiella pneumoniae NOT DETECTED NOT DETECTED   Proteus species NOT DETECTED NOT DETECTED   Serratia marcescens NOT DETECTED NOT DETECTED   Carbapenem resistance NOT DETECTED NOT DETECTED   Haemophilus influenzae NOT DETECTED NOT DETECTED   Neisseria meningitidis NOT DETECTED NOT DETECTED   Pseudomonas aeruginosa NOT DETECTED NOT DETECTED   Candida albicans NOT DETECTED NOT DETECTED   Candida glabrata NOT DETECTED NOT DETECTED   Candida krusei NOT DETECTED NOT DETECTED   Candida parapsilosis NOT DETECTED NOT DETECTED   Candida tropicalis NOT DETECTED NOT DETECTED    Deagen Krass 03/29/2019  11:53 AM

## 2019-03-29 NOTE — Anesthesia Post-op Follow-up Note (Signed)
Anesthesia QCDR form completed.        

## 2019-03-29 NOTE — Care Management Obs Status (Signed)
MEDICARE OBSERVATION STATUS NOTIFICATION   Patient Details  Name: [name] MRN: [id number] Date of Birth: 10/20/1976   Medicare Observation Status Notification Given:  Yes    Earon Rivest A Winifred Bodiford, RN 03/29/2019, 10:40 AM

## 2019-03-29 NOTE — Anesthesia Procedure Notes (Signed)
Procedure Name: Intubation Date/Time: 03/29/2019 10:04 PM Performed by: Allean Found, CRNA Pre-anesthesia Checklist: Patient identified, Patient being monitored, Timeout performed, Emergency Drugs available and Suction available Patient Re-evaluated:Patient Re-evaluated prior to induction Oxygen Delivery Method: Circle system utilized Preoxygenation: Pre-oxygenation with 100% oxygen Induction Type: IV induction Ventilation: Mask ventilation without difficulty Laryngoscope Size: Mac and 4 Grade View: Grade I Tube type: Oral Tube size: 7.5 mm Number of attempts: 1 Airway Equipment and Method: Stylet Placement Confirmation: ETT inserted through vocal cords under direct vision,  positive ETCO2 and breath sounds checked- equal and bilateral Secured at: 21 cm Tube secured with: Tape Dental Injury: Teeth and Oropharynx as per pre-operative assessment

## 2019-03-29 NOTE — Transfer of Care (Signed)
Immediate Anesthesia Transfer of Care Note  Patient: [name]  Procedure(s) Performed: APPENDECTOMY LAPAROSCOPIC (N/A )  Patient Location: PACU  Anesthesia Type:General  Level of Consciousness: awake, alert  and oriented  Airway & Oxygen Therapy: Patient Spontanous Breathing and Patient connected to face mask oxygen  Post-op Assessment: Report given to RN and Post -op Vital signs reviewed and stable  Post vital signs: Reviewed and stable  Last Vitals:  Vitals Value Taken Time  BP 142/102 03/29/2019 12:00 AM  Temp    Pulse 106 03/29/2019 12:01 AM  Resp 17 03/29/2019 12:01 AM  SpO2 100 % 03/29/2019 12:01 AM  Vitals shown include unvalidated device data.  Last Pain:  Vitals:   03/28/19 2121  TempSrc: Oral  PainSc:          Complications: No apparent anesthesia complications

## 2019-03-30 ENCOUNTER — Encounter: Payer: Self-pay | Admitting: Surgery

## 2019-03-30 DIAGNOSIS — M7989 Other specified soft tissue disorders: Secondary | ICD-10-CM | POA: Diagnosis not present

## 2019-03-30 DIAGNOSIS — K35891 Other acute appendicitis without perforation, with gangrene: Secondary | ICD-10-CM | POA: Diagnosis present

## 2019-03-30 DIAGNOSIS — D72819 Decreased white blood cell count, unspecified: Secondary | ICD-10-CM | POA: Diagnosis not present

## 2019-03-30 DIAGNOSIS — K429 Umbilical hernia without obstruction or gangrene: Secondary | ICD-10-CM | POA: Diagnosis not present

## 2019-03-30 DIAGNOSIS — E1140 Type 2 diabetes mellitus with diabetic neuropathy, unspecified: Secondary | ICD-10-CM

## 2019-03-30 DIAGNOSIS — R51 Headache: Secondary | ICD-10-CM | POA: Diagnosis not present

## 2019-03-30 DIAGNOSIS — Z794 Long term (current) use of insulin: Secondary | ICD-10-CM

## 2019-03-30 DIAGNOSIS — J189 Pneumonia, unspecified organism: Secondary | ICD-10-CM | POA: Diagnosis present

## 2019-03-30 DIAGNOSIS — Z8249 Family history of ischemic heart disease and other diseases of the circulatory system: Secondary | ICD-10-CM | POA: Diagnosis not present

## 2019-03-30 DIAGNOSIS — B9620 Unspecified Escherichia coli [E. coli] as the cause of diseases classified elsewhere: Secondary | ICD-10-CM | POA: Diagnosis not present

## 2019-03-30 DIAGNOSIS — E1122 Type 2 diabetes mellitus with diabetic chronic kidney disease: Secondary | ICD-10-CM | POA: Diagnosis not present

## 2019-03-30 DIAGNOSIS — Z9089 Acquired absence of other organs: Secondary | ICD-10-CM | POA: Diagnosis not present

## 2019-03-30 DIAGNOSIS — Z981 Arthrodesis status: Secondary | ICD-10-CM | POA: Diagnosis not present

## 2019-03-30 DIAGNOSIS — Z91018 Allergy to other foods: Secondary | ICD-10-CM | POA: Diagnosis not present

## 2019-03-30 DIAGNOSIS — K37 Unspecified appendicitis: Secondary | ICD-10-CM | POA: Diagnosis not present

## 2019-03-30 DIAGNOSIS — N189 Chronic kidney disease, unspecified: Secondary | ICD-10-CM | POA: Diagnosis not present

## 2019-03-30 DIAGNOSIS — Z91012 Allergy to eggs: Secondary | ICD-10-CM | POA: Diagnosis not present

## 2019-03-30 DIAGNOSIS — Z79899 Other long term (current) drug therapy: Secondary | ICD-10-CM

## 2019-03-30 DIAGNOSIS — R7881 Bacteremia: Secondary | ICD-10-CM | POA: Diagnosis not present

## 2019-03-30 DIAGNOSIS — K3580 Unspecified acute appendicitis: Secondary | ICD-10-CM | POA: Diagnosis present

## 2019-03-30 DIAGNOSIS — I129 Hypertensive chronic kidney disease with stage 1 through stage 4 chronic kidney disease, or unspecified chronic kidney disease: Secondary | ICD-10-CM

## 2019-03-30 DIAGNOSIS — D696 Thrombocytopenia, unspecified: Secondary | ICD-10-CM | POA: Diagnosis not present

## 2019-03-30 DIAGNOSIS — K381 Appendicular concretions: Secondary | ICD-10-CM | POA: Diagnosis present

## 2019-03-30 DIAGNOSIS — K439 Ventral hernia without obstruction or gangrene: Secondary | ICD-10-CM | POA: Diagnosis not present

## 2019-03-30 DIAGNOSIS — A4151 Sepsis due to Escherichia coli [E. coli]: Secondary | ICD-10-CM | POA: Diagnosis present

## 2019-03-30 DIAGNOSIS — Z20828 Contact with and (suspected) exposure to other viral communicable diseases: Secondary | ICD-10-CM | POA: Diagnosis present

## 2019-03-30 DIAGNOSIS — E860 Dehydration: Secondary | ICD-10-CM | POA: Diagnosis present

## 2019-03-30 DIAGNOSIS — I10 Essential (primary) hypertension: Secondary | ICD-10-CM | POA: Diagnosis not present

## 2019-03-30 DIAGNOSIS — R110 Nausea: Secondary | ICD-10-CM | POA: Diagnosis not present

## 2019-03-30 DIAGNOSIS — E1042 Type 1 diabetes mellitus with diabetic polyneuropathy: Secondary | ICD-10-CM | POA: Diagnosis present

## 2019-03-30 DIAGNOSIS — Z833 Family history of diabetes mellitus: Secondary | ICD-10-CM | POA: Diagnosis not present

## 2019-03-30 LAB — GLUCOSE, CAPILLARY
Glucose-Capillary: 178 mg/dL — ABNORMAL HIGH (ref 70–99)
Glucose-Capillary: 172 mg/dL — ABNORMAL HIGH (ref 70–99)
Glucose-Capillary: 193 mg/dL — ABNORMAL HIGH (ref 70–99)
Glucose-Capillary: 211 mg/dL — ABNORMAL HIGH (ref 70–99)

## 2019-03-30 LAB — CBC WITH DIFFERENTIAL/PLATELET
Abs Immature Granulocytes: 0.04 10*3/uL (ref 0.00–0.07)
Basophils Absolute: 0 10*3/uL (ref 0.0–0.1)
Basophils Relative: 0 %
Eosinophils Absolute: 0.1 10*3/uL (ref 0.0–0.5)
Eosinophils Relative: 2 %
HCT: 36 % — ABNORMAL LOW (ref 39.0–52.0)
Hemoglobin: 12.2 g/dL — ABNORMAL LOW (ref 13.0–17.0)
Immature Granulocytes: 1 %
Lymphocytes Relative: 6 %
Lymphs Abs: 0.3 10*3/uL — ABNORMAL LOW (ref 0.7–4.0)
MCH: 28.4 pg (ref 26.0–34.0)
MCHC: 33.9 g/dL (ref 30.0–36.0)
MCV: 83.7 fL (ref 80.0–100.0)
Monocytes Absolute: 0.4 10*3/uL (ref 0.1–1.0)
Monocytes Relative: 7 %
Neutro Abs: 4.3 10*3/uL (ref 1.7–7.7)
Neutrophils Relative %: 84 %
Platelets: 85 10*3/uL — ABNORMAL LOW (ref 150–400)
RBC: 4.3 MIL/uL (ref 4.22–5.81)
RDW: 12.8 % (ref 11.5–15.5)
WBC: 5.1 10*3/uL (ref 4.0–10.5)
nRBC: 0 % (ref 0.0–0.2)

## 2019-03-30 LAB — BASIC METABOLIC PANEL
Anion gap: 6 (ref 5–15)
BUN: 19 mg/dL (ref 6–20)
CO2: 25 mmol/L (ref 22–32)
Calcium: 8.1 mg/dL — ABNORMAL LOW (ref 8.9–10.3)
Chloride: 110 mmol/L (ref 98–111)
Creatinine, Ser: 1.37 mg/dL — ABNORMAL HIGH (ref 0.61–1.24)
GFR calc Af Amer: >60 mL/min (ref >60)
GFR calc non Af Amer: >60 mL/min (ref >60)
Glucose, Bld: 190 mg/dL — ABNORMAL HIGH (ref 70–99)
Potassium: 4 mmol/L (ref 3.5–5.1)
Sodium: 141 mmol/L (ref 135–145)

## 2019-03-30 LAB — MAGNESIUM: Magnesium: 1.7 mg/dL (ref 1.7–2.4)

## 2019-03-30 LAB — PHOSPHORUS: Phosphorus: 2 mg/dL — ABNORMAL LOW (ref 2.5–4.6)

## 2019-03-30 MED ORDER — POTASSIUM PHOSPHATE MONOBASIC 500 MG PO TABS
500.0000 mg | ORAL_TABLET | Freq: Three times a day (TID) | ORAL | Status: AC
  Administered 2019-03-30 (×2): 500 mg via ORAL
  Filled 2019-03-30 (×4): qty 1

## 2019-03-30 MED ORDER — HYDROCODONE-ACETAMINOPHEN 5-325 MG PO TABS
1.0000 | ORAL_TABLET | ORAL | Status: DC | PRN
  Administered 2019-03-30 – 2019-04-01 (×4): 2 via ORAL
  Filled 2019-03-30 (×5): qty 2

## 2019-03-30 MED ORDER — ENOXAPARIN SODIUM 40 MG/0.4ML ~~LOC~~ SOLN
40.0000 mg | SUBCUTANEOUS | Status: DC
  Administered 2019-03-30 – 2019-04-01 (×3): 40 mg via SUBCUTANEOUS
  Filled 2019-03-30 (×3): qty 0.4

## 2019-03-30 MED ORDER — INSULIN DETEMIR 100 UNIT/ML ~~LOC~~ SOLN
20.0000 [IU] | Freq: Two times a day (BID) | SUBCUTANEOUS | Status: DC
  Administered 2019-03-30: 20 [IU] via SUBCUTANEOUS
  Filled 2019-03-30 (×3): qty 0.2

## 2019-03-30 MED ORDER — OXYCODONE-ACETAMINOPHEN 5-325 MG PO TABS
1.0000 | ORAL_TABLET | Freq: Four times a day (QID) | ORAL | Status: DC | PRN
  Filled 2019-03-30: qty 1

## 2019-03-30 MED ORDER — ACETAMINOPHEN 500 MG PO TABS
500.0000 mg | ORAL_TABLET | Freq: Four times a day (QID) | ORAL | Status: DC
  Administered 2019-03-30 – 2019-04-02 (×12): 500 mg via ORAL
  Filled 2019-03-30 (×12): qty 1

## 2019-03-30 MED ORDER — LACTATED RINGERS IV BOLUS
500.0000 mL | Freq: Once | INTRAVENOUS | Status: AC
  Administered 2019-03-30: 08:00:00 500 mL via INTRAVENOUS

## 2019-03-30 MED ORDER — CELECOXIB 200 MG PO CAPS
200.0000 mg | ORAL_CAPSULE | Freq: Every day | ORAL | Status: DC
  Administered 2019-03-30 – 2019-04-01 (×3): 200 mg via ORAL
  Filled 2019-03-30 (×3): qty 1

## 2019-03-30 NOTE — Progress Notes (Signed)
Subjective:  CC: [name] is a 43 y.o. male, 2 Days Post-Op lap appy for gangernous appendicitis  HPI: Tolerating regular diet, still complaining of decent pain along RLQ and umbilical port site.  Having BMs   ROS:  General: Denies weight loss, weight gain, fatigue, fevers, chills, and night sweats. Heart: Denies chest pain, palpitations, racing heart, irregular heartbeat, leg pain or swelling, and decreased activity tolerance. Respiratory: Denies breathing difficulty, shortness of breath, wheezing, cough, and sputum. GI: Denies change in appetite, heartburn, nausea, vomiting, constipation, diarrhea, and blood in stool. GU: Denies difficulty urinating, pain with urinating, urgency, frequency, blood in urine.   Objective:   Temp:  [99.2 F (37.3 C)-100.7 F (38.2 C)] 99.2 F (37.3 C) (05/11 0420) Pulse Rate:  [100-123] 114 (05/11 0420) Resp:  [16-20] 16 (05/11 0420) BP: (132-153)/(90-109) 151/109 (05/11 0420) SpO2:  [96 %-97 %] 96 % (05/11 0420)     Height: 6\' 1"  (185.4 cm) Weight: 102.1 kg BMI (Calculated): 29.69   Intake/Output this shift:   Intake/Output Summary (Last 24 hours) at 03/30/2019 1029 Last data filed at 03/30/2019 1013 Gross per 24 hour  Intake 3293.09 ml  Output 900 ml  Net 2393.09 ml    Constitutional :  alert, cooperative, appears stated age and no distress  Respiratory:  clear to auscultation bilaterally  Cardiovascular:  regular rate and rhythm  Gastrointestinal: Soft, no guarding, but moderate amount of tenderness still present in right lower quadrant and umbilical port site.  Incisions are clean dry and intact.   Skin: Cool and moist.   Psychiatric: Normal affect, non-agitated, not confused       LABS:  CMP Latest Ref Rng & Units 03/30/2019 03/28/2019 05/26/2018  Glucose 70 - 99 mg/dL 212(Y) 482(N) 003(B)  BUN 6 - 20 mg/dL 19 19 14   Creatinine 0.61 - 1.24 mg/dL 0.48(G) 8.91(Q) 9.45  Sodium 135 - 145 mmol/L 141 142 136  Potassium 3.5 - 5.1  mmol/L 4.0 3.9 4.0  Chloride 98 - 111 mmol/L 110 106 98  CO2 22 - 32 mmol/L 25 29 30   Calcium 8.9 - 10.3 mg/dL 8.1(L) 9.2 9.4  Total Protein 6.5 - 8.1 g/dL - 7.5 7.3  Total Bilirubin 0.3 - 1.2 mg/dL - 0.8 0.8  Alkaline Phos 38 - 126 U/L - 59 69  AST 15 - 41 U/L - 16 11  ALT 0 - 44 U/L - 20 10   CBC Latest Ref Rng & Units 03/30/2019 03/28/2019 05/26/2018  WBC 4.0 - 10.5 K/uL 5.1 8.9 2.8(L)  Hemoglobin 13.0 - 17.0 g/dL 12.2(L) 14.6 15.3  Hematocrit 39.0 - 52.0 % 36.0(L) 42.5 44.9  Platelets 150 - 400 K/uL 85(L) 158 151.0    RADS: n/a Assessment:   S/p lap appy for gangernous appendicitis.  We will continue IV antibiotics and await infectious disease recommendations.  Despite tolerating a regular diet patient still is in moderate amount of pain.  Will adjust pain medications to see if we can obtain better control.  White count remains normal.  We will continue to monitor to ensure no issues with staple line at the base of former appendix.  Patient states he is recently dropped insulin requirements from 30 units to 20 units after obtaining better glycemic control which is checked at home.  Will adjust medications as needed.  I explained to him the reasoning behind sliding scale insulin and the importance of maintaining glycemic control through his septicemia.  Verbalized understanding is agreeable to receiving sliding scale insulin as  needed  Persistent tachycardia also noted as well, status post half liter bolus.  Differential includes persistent sirs from bacteremia, inadequate pain control, dehydration(decreasing but still elevated creatinine level), DVT, PE.  We will continue IV fluids for now and monitor.

## 2019-03-30 NOTE — Consult Note (Signed)
NAME: [name]  DOB: [birth year]  MRN: [id number]  Date/Time: 03/30/2019 10:38 AM  REQUESTING PROVIDER: Tonna Boehringer Subjective:  REASON FOR CONSULT: E. coli bacteremia ?History from patient and chart reviewed [name] is a 43 y.o. male with a history of hypertension and diabetes mellitus presented on 03/28/2019 with abdominal pain.  Pt stated the pain was sudden and was in the umbilical area and rt lower abdomen and  felt like a hernia pain.  He also said that he had neuropathy and pain throughout his whole body and has been taking gabapentin and Cymbalta.  In the ED he was noted to be febrile with a temperature of 101.9, blood pressure 134/93 and heart rate of 116.  His lab work revealed a creatinine of 1.55, blood glucose of 146, CT abdomen and pelvis were obtained with contrast and that showed changes consistent with acute appendicitis.  He was started on Zosyn and was seen by the surgeon and taken for laparoscopic appendectomy on 03/29/2019.  Blood culture that was sent on 03/28/2019 is growing E. coli and his antibiotic was changed to meropenem as per the hospital policy and I am consulted for the same.   Past Medical History:  Diagnosis Date  . Diabetes mellitus   . 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 Hypertension Mother   . Diabetes Father    Allergies  Allergen Reactions  . Food     Chicken, eggs and peaches    ? Current Facility-Administered Medications  Medication Dose Route Frequency Provider Last Rate Last Dose  . 0.9 %  sodium chloride infusion   Intravenous PRN Tonna Boehringer, Isami, DO   Stopped at 03/29/19 2134  . acetaminophen (TYLENOL) tablet 500 mg  500 mg Oral Q6H Sakai, Isami, DO   500 mg at 03/30/19 1015  . atorvastatin (LIPITOR) tablet 20 mg  20 mg Oral Daily Sakai, Isami, DO   20 mg at 03/29/19 1743  . celecoxib (CELEBREX) capsule 200 mg  200 mg Oral Daily Sakai, Isami, DO   200 mg at 03/30/19 1015  . gabapentin (NEURONTIN) tablet 600 mg  600 mg Oral TID Tonna Boehringer, Isami, DO   600 mg at 03/30/19 0813  . insulin aspart (novoLOG) injection 0-15 Units  0-15 Units Subcutaneous TID WC Sakai, Isami, DO      . insulin detemir (LEVEMIR) injection 20 Units  20 Units Subcutaneous BID WC Sakai, Isami, DO      . lactated ringers infusion   Intravenous Continuous Sakai, Isami, DO 100 mL/hr at 03/30/19 8101    . lisinopril (ZESTRIL) tablet 30 mg  30 mg Oral Daily Sakai, Isami, DO   30 mg at 03/30/19 7510  . meropenem (MERREM) 1 g in sodium chloride 0.9 % 100 mL IVPB  1 g Intravenous Q8H Sakai, Isami, DO 200 mL/hr at 03/30/19 0503  1 g at 03/30/19 0503  . morphine 2 MG/ML injection 1 mg  1 mg Intravenous Q3H PRN Sakai, Isami, DO   1 mg at 03/29/19 0758  . ondansetron (ZOFRAN-ODT) disintegrating tablet 4 mg  4 mg Oral Q6H PRN Tonna Boehringer, Isami, DO       Or  . ondansetron (ZOFRAN) injection 4 mg  4 mg Intravenous Q6H PRN Sakai, Isami, DO      . oxyCODONE-acetaminophen (PERCOCET/ROXICET) 5-325 MG per tablet 1-2 tablet  1-2 tablet Oral Q6H PRN Tonna Boehringer, Isami, DO         Abtx:  Anti-infectives (From admission, onward)   Start     Dose/Rate Route Frequency Ordered Stop   03/29/19 1500   meropenem (MERREM) 1 g in sodium chloride 0.9 % 100 mL IVPB     1 g 200 mL/hr over 30 Minutes Intravenous Every 8 hours 03/29/19 1454     03/28/19 1945  piperacillin-tazobactam (ZOSYN) IVPB 3.375 g     3.375 g 100 mL/hr over 30 Minutes Intravenous  Once 03/28/19 1933 03/28/19 2101      REVIEW OF SYSTEMS:  Const: negative fever, negative chills, negative weight loss Eyes: negative diplopia or visual changes, negative eye pain ENT: negative coryza, negative sore throat Resp: negative cough, hemoptysis, dyspnea Cards: negative for chest pain, palpitations, lower extremity edema GU: negative for frequency, dysuria and hematuria GI:  abdominal pain, nausea , vomiting constipation Skin: negative for rash and pruritus Heme: negative for easy bruising and gum/nose bleeding ZO:[id number] body ache Neurolo:negative for headaches, dizziness, vertigo, memory problems  Psych: negative for feelings of anxiety, depression  Endocrine:has DM Allergy/Immunology- food allergy Objective:  VITALS:  BP (!) 151/109 (BP Location: Right Arm) Comment: Reported to RN Crystal.  Pulse (!) 114   Temp 99.2 F (37.3 C) (Oral)   Resp 16   Ht  (1.854 m)   Wt 102.1 kg   SpO2 96%   BMI 29.69 kg/m  PHYSICAL EXAM:  General: Alert, cooperative, no distress, appears stated age.  Head: Normocephalic, without obvious abnormality, atraumatic. Eyes: Conjunctivae clear, anicteric sclerae. Pupils are equal ENT Nares normal. No drainage or sinus tenderness. Lips, mucosa, and tongue normal. No Thrush Neck: Supple, symmetrical, no adenopathy, thyroid: non tender no carotid bruit and no JVD. Back: No CVA tenderness. Lungs: b/l air entry Heart: Regular rate and rhythm, no murmur, rub or gallop. Abdomen: Soft,,not distended. Some tenderness- lap site glued- not erythematous Bowel sounds normal. No masses Extremities: atraumatic, no cyanosis. No edema. No clubbing Skin: No rashes or lesions. Or bruising Lymph:  Cervical, supraclavicular normal. Neurologic: Grossly non-focal Pertinent Labs Lab Results CBC    Component Value Date/Time   WBC 5.1 03/30/2019 0810   RBC 4.30 03/30/2019 0810   HGB 12.2 (L) 03/30/2019 0810   HGB 12.6 (L) 02/13/2012 0223   HCT 36.0 (L) 03/30/2019 0810   HCT 37.6 (L) 02/13/2012 0223   PLT 85 (L) 03/30/2019 0810   PLT 162 02/13/2012 0223   MCV 83.7 03/30/2019 0810   MCV 87 02/13/2012 0223   MCH 28.4 03/30/2019 0810   MCHC 33.9 03/30/2019 0810   RDW 12.8 03/30/2019 0810   RDW 14.2 02/13/2012 0223   LYMPHSABS 0.3 (L) 03/30/2019 0810   MONOABS 0.4 03/30/2019 0810   EOSABS 0.1 03/30/2019 0810   BASOSABS 0.0 03/30/2019 0810    CMP Latest Ref Rng & Units 03/30/2019 03/28/2019 05/26/2018  Glucose 70 - 99 mg/dL 119(J) 478(G) 956(O)  BUN 6 - 20 mg/dL 19  19 14  Creatinine 0.61 - 1.24 mg/dL 8.03(O) 1.22(Q) 8.25  Sodium 135 - 145 mmol/L 141 142 136  Potassium 3.5 - 5.1 mmol/L 4.0 3.9 4.0  Chloride 98 - 111 mmol/L 110 106 98  CO2 22 - 32 mmol/L 25 29 30   Calcium 8.9 - 10.3 mg/dL 8.1(L) 9.2 9.4  Total Protein 6.5 - 8.1 g/dL - 7.5 7.3  Total Bilirubin 0.3 - 1.2 mg/dL - 0.8 0.8  Alkaline Phos 38 - 126 U/L - 59 69  AST 15 - 41 U/L - 16 11  ALT 0 - 44 U/L - 20 10      Microbiology: Recent Results (from the past 240 hour(s))  Blood culture (routine x 2)     Status: None (Preliminary result)   Collection Time: 03/28/19  6:54 PM  Result Value Ref Range Status   Specimen Description BLOOD LEFT FOREARM  Final   Special Requests   Final    BOTTLES DRAWN AEROBIC AND ANAEROBIC Blood Culture results may not be optimal due to an excessive volume of blood received in culture bottles Normal   Culture   Final    NO GROWTH 2 DAYS Performed at [facility], [address], [place], [place] [address]    Report Status PENDING  Incomplete  Blood culture (routine x 2)     Status: Abnormal (Preliminary result)   Collection Time: 03/28/19  6:54 PM  Result Value Ref Range  Status   Specimen Description   Final    BLOOD LEFT ANTECUBITAL Performed at [facility], [address] [place], [place] [address]    Special Requests   Final    BOTTLES DRAWN AEROBIC AND ANAEROBIC Blood Culture results may not be optimal due to an excessive volume of blood received in culture bottles Normal Performed at [facility], [address] [place], [place] [address]    Culture  Setup Time   Final    GRAM NEGATIVE RODS AEROBIC BOTTLE ONLY CRITICAL RESULT CALLED TO, READ BACK BY AND VERIFIED WITH: [facility] SLAUGHTER AT 1115 03/29/2019.PMF    Culture (A)  Final    ESCHERICHIA COLI SUSCEPTIBILITIES TO FOLLOW Performed at [facility], [address], [place], [place] [address]    Report Status PENDING  Incomplete  SARS Coronavirus 2 (CEPHEID- Performed in [facility] hospital lab), Hosp Order     Status: None   Collection Time: 03/28/19  6:54 PM  Result Value Ref Range Status   SARS Coronavirus 2 NEGATIVE NEGATIVE Final    Comment: (NOTE) If result is NEGATIVE SARS-CoV-2 target nucleic acids are NOT DETECTED. The SARS-CoV-2 RNA is generally detectable in upper and lower  respiratory specimens during the acute phase of infection. The lowest  concentration of SARS-CoV-2 viral copies this assay can detect is 250  copies / mL. A negative result does not preclude SARS-CoV-2 infection  and should not be used as the sole basis for treatment or other  patient management decisions.  A negative result may occur with  improper specimen collection / handling, submission of specimen other  than nasopharyngeal swab, presence of viral mutation(s) within the  areas targeted by this assay, and inadequate number of viral copies  (<250 copies / mL). A negative result must be combined with clinical  observations, patient history, and epidemiological information. If result is POSITIVE SARS-CoV-2 target nucleic acids are DETECTED. The SARS-CoV-2 RNA is generally  detectable in upper and lower  respiratory specimens dur ing the acute phase  of infection.  Positive  results are indicative of active infection with SARS-CoV-2.  Clinical  correlation with patient history and other diagnostic information is  necessary to determine patient infection status.  Positive results do  not rule out bacterial infection or co-infection with other viruses. If result is PRESUMPTIVE POSTIVE SARS-CoV-2 nucleic acids MAY BE PRESENT.   A presumptive positive result was obtained on the submitted specimen  and confirmed on repeat testing.  While 2019 novel coronavirus  (SARS-CoV-2) nucleic acids may be present in the submitted sample  additional confirmatory testing may be necessary for epidemiological  and / or clinical management purposes  to differentiate between  SARS-CoV-2 and other Sarbecovirus currently known to infect humans.  If clinically indicated additional testing with an alternate test  methodology [phone]) is advised. The SARS-CoV-2 RNA is generally  detectable in upper and lower respiratory sp ecimens during the acute  phase of infection. The expected result is Negative. Fact Sheet for Patients:  BoilerBrush.com.cy Fact Sheet for Healthcare Providers: https://pope.com/ This test is not yet approved or cleared by the [place] FDA and has been authorized for detection and/or diagnosis of SARS-CoV-2 by FDA under an Emergency Use Authorization (EUA).  This EUA will remain in effect (meaning this test can be used) for the duration of the COVID-19 declaration under Section 564(b)(1) of the Act, 21 U.S.C. section 360bbb-3(b)(1), unless the authorization is terminated or revoked sooner. Performed at [facility], [address] [place], [place] [address]   Blood Culture ID Panel (Reflexed)     Status: Abnormal   Collection Time: 03/28/19  6:54 PM  Result Value Ref Range Status   Enterococcus  species NOT DETECTED NOT DETECTED Final   Listeria monocytogenes NOT DETECTED NOT DETECTED Final   Staphylococcus species NOT DETECTED NOT DETECTED Final   Staphylococcus aureus (BCID) NOT DETECTED NOT DETECTED Final   Streptococcus species NOT DETECTED NOT DETECTED Final   Streptococcus agalactiae NOT DETECTED NOT DETECTED Final   Streptococcus pneumoniae NOT DETECTED NOT DETECTED Final   Streptococcus pyogenes NOT DETECTED NOT DETECTED Final   Acinetobacter baumannii NOT DETECTED NOT DETECTED Final   Enterobacteriaceae species DETECTED (A) NOT DETECTED Final    Comment: Enterobacteriaceae represent a large family of gram-negative bacteria, not a single organism. CRITICAL RESULT CALLED TO, READ BACK BY AND VERIFIED WITH: [facility] SLAUGHTER AT 1115 03/29/2019.PMF    Enterobacter cloacae complex NOT DETECTED NOT DETECTED Final   Escherichia coli DETECTED (A) NOT DETECTED Final    Comment: CRITICAL RESULT CALLED TO, READ BACK BY AND VERIFIED WITH: [facility] SLAUGHTER AT 1115 03/29/2019.PMF    Klebsiella oxytoca NOT DETECTED NOT DETECTED Final   Klebsiella pneumoniae NOT DETECTED NOT DETECTED Final   Proteus species NOT DETECTED NOT DETECTED Final   Serratia marcescens NOT DETECTED NOT DETECTED Final   Carbapenem resistance NOT DETECTED NOT DETECTED Final   Haemophilus influenzae NOT DETECTED NOT DETECTED Final   Neisseria meningitidis NOT DETECTED NOT DETECTED Final   Pseudomonas aeruginosa NOT DETECTED NOT DETECTED Final   Candida albicans NOT DETECTED NOT DETECTED Final   Candida glabrata NOT DETECTED NOT DETECTED Final   Candida krusei NOT DETECTED NOT DETECTED Final   Candida parapsilosis NOT DETECTED NOT DETECTED Final   Candida tropicalis NOT DETECTED NOT DETECTED Final    Comment: Performed at [facility], [address] [place], [place] [address]    IMAGING RESULTS: Changes consistent with acute appendicitis as described above with a central 8 mm appendicoliths.  I  have  personally reviewed the films  Impression/recommendation ? ?Acute appendicitis status post appendectomy. E. coli bacteremia.  Currently on meropenem.  Susceptibility pending.  Depending on the sensitivities we can then decide whether he has an oral antibiotic option  CKD  Hypertension on lisinopril  Diabetes mellitus on insulin  Neuropathy on gabapentin ? ___________________________________________________ Discussed with patient in detail Note:  This document was prepared using Dragon voice recognition software and may include unintentional dictation errors.

## 2019-03-30 NOTE — Progress Notes (Addendum)
Pt refused sliding scale insulin. He also states he does not want 30 units of NPH. He states he only wants 20 units. Sent secure chat to MD requesting order for 20 units.  MD to speak with pt.

## 2019-03-31 ENCOUNTER — Inpatient Hospital Stay: Payer: Medicare Other

## 2019-03-31 DIAGNOSIS — K35891 Other acute appendicitis without perforation, with gangrene: Secondary | ICD-10-CM

## 2019-03-31 DIAGNOSIS — N189 Chronic kidney disease, unspecified: Secondary | ICD-10-CM

## 2019-03-31 LAB — CBC WITH DIFFERENTIAL/PLATELET
Abs Immature Granulocytes: 0.01 10*3/uL (ref 0.00–0.07)
Basophils Absolute: 0 10*3/uL (ref 0.0–0.1)
Basophils Relative: 0 %
Eosinophils Absolute: 0.1 10*3/uL (ref 0.0–0.5)
Eosinophils Relative: 5 %
HCT: 35.3 % — ABNORMAL LOW (ref 39.0–52.0)
Hemoglobin: 12.1 g/dL — ABNORMAL LOW (ref 13.0–17.0)
Immature Granulocytes: 0 %
Lymphocytes Relative: 14 %
Lymphs Abs: 0.4 10*3/uL — ABNORMAL LOW (ref 0.7–4.0)
MCH: 28.8 pg (ref 26.0–34.0)
MCHC: 34.3 g/dL (ref 30.0–36.0)
MCV: 84 fL (ref 80.0–100.0)
Monocytes Absolute: 0.3 10*3/uL (ref 0.1–1.0)
Monocytes Relative: 11 %
Neutro Abs: 2.1 10*3/uL (ref 1.7–7.7)
Neutrophils Relative %: 70 %
Platelets: 80 10*3/uL — ABNORMAL LOW (ref 150–400)
RBC: 4.2 MIL/uL — ABNORMAL LOW (ref 4.22–5.81)
RDW: 12.6 % (ref 11.5–15.5)
WBC: 3 10*3/uL — ABNORMAL LOW (ref 4.0–10.5)
nRBC: 0 % (ref 0.0–0.2)

## 2019-03-31 LAB — BLOOD CULTURE ID PANEL (REFLEXED)

## 2019-03-31 LAB — GLUCOSE, CAPILLARY
Glucose-Capillary: 135 mg/dL — ABNORMAL HIGH (ref 70–99)
Glucose-Capillary: 157 mg/dL — ABNORMAL HIGH (ref 70–99)
Glucose-Capillary: 141 mg/dL — ABNORMAL HIGH (ref 70–99)
Glucose-Capillary: 167 mg/dL — ABNORMAL HIGH (ref 70–99)

## 2019-03-31 LAB — BASIC METABOLIC PANEL
Anion gap: 7 (ref 5–15)
BUN: 13 mg/dL (ref 6–20)
CO2: 26 mmol/L (ref 22–32)
Calcium: 8.3 mg/dL — ABNORMAL LOW (ref 8.9–10.3)
Chloride: 107 mmol/L (ref 98–111)
Creatinine, Ser: 1.09 mg/dL (ref 0.61–1.24)
GFR calc Af Amer: >60 mL/min (ref >60)
GFR calc non Af Amer: >60 mL/min (ref >60)
Glucose, Bld: 177 mg/dL — ABNORMAL HIGH (ref 70–99)
Potassium: 3.8 mmol/L (ref 3.5–5.1)
Sodium: 140 mmol/L (ref 135–145)

## 2019-03-31 LAB — PHOSPHORUS: Phosphorus: 2.6 mg/dL (ref 2.5–4.6)

## 2019-03-31 LAB — SURGICAL PATHOLOGY

## 2019-03-31 LAB — MAGNESIUM: Magnesium: 1.8 mg/dL (ref 1.7–2.4)

## 2019-03-31 MED ORDER — HYDRALAZINE HCL 20 MG/ML IJ SOLN
5.0000 mg | INTRAMUSCULAR | Status: DC | PRN
  Administered 2019-03-31 – 2019-04-02 (×4): 5 mg via INTRAVENOUS
  Filled 2019-03-31 (×4): qty 1

## 2019-03-31 MED ORDER — INSULIN ASPART PROT & ASPART (70-30 MIX) 100 UNIT/ML ~~LOC~~ SUSP
15.0000 [IU] | Freq: Two times a day (BID) | SUBCUTANEOUS | Status: DC
  Administered 2019-03-31 – 2019-04-02 (×5): 15 [IU] via SUBCUTANEOUS
  Filled 2019-03-31 (×5): qty 10

## 2019-03-31 MED ORDER — SODIUM CHLORIDE 0.9 % IV SOLN
3.0000 g | Freq: Four times a day (QID) | INTRAVENOUS | Status: DC
  Administered 2019-03-31 – 2019-04-02 (×7): 3 g via INTRAVENOUS
  Filled 2019-03-31 (×10): qty 3

## 2019-03-31 NOTE — Progress Notes (Signed)
PHARMACY - PHYSICIAN COMMUNICATION CRITICAL VALUE ALERT - BLOOD CULTURE  [name] is an 43 y.o. male who presented to [facility] on 03/28/2019  Assessment:  1/4 (anaerobic bottle) gram positive rods  Name of physician (or Provider) Contacted: Dr. Rivka Safer  Current antibiotics: meropenem  Changes to prescribed antibiotics recommended: none  Results for orders placed or performed during the hospital encounter of 03/28/19  Blood Culture ID Panel (Reflexed) (Collected: 03/28/2019  6:54 PM)  Result Value Ref Range   Enterococcus species NOT DETECTED NOT DETECTED   Listeria monocytogenes NOT DETECTED NOT DETECTED   Staphylococcus species NOT DETECTED NOT DETECTED   Staphylococcus aureus (BCID) NOT DETECTED NOT DETECTED   Streptococcus species NOT DETECTED NOT DETECTED   Streptococcus agalactiae NOT DETECTED NOT DETECTED   Streptococcus pneumoniae NOT DETECTED NOT DETECTED   Streptococcus pyogenes NOT DETECTED NOT DETECTED   Acinetobacter baumannii NOT DETECTED NOT DETECTED   Enterobacteriaceae species NOT DETECTED NOT DETECTED   Enterobacter cloacae complex NOT DETECTED NOT DETECTED   Escherichia coli NOT DETECTED NOT DETECTED   Klebsiella oxytoca NOT DETECTED NOT DETECTED   Klebsiella pneumoniae NOT DETECTED NOT DETECTED   Proteus species NOT DETECTED NOT DETECTED   Serratia marcescens NOT DETECTED NOT DETECTED   Haemophilus influenzae NOT DETECTED NOT DETECTED   Neisseria meningitidis NOT DETECTED NOT DETECTED   Pseudomonas aeruginosa NOT DETECTED NOT DETECTED   Candida albicans NOT DETECTED NOT DETECTED   Candida glabrata NOT DETECTED NOT DETECTED   Candida krusei NOT DETECTED NOT DETECTED   Candida parapsilosis NOT DETECTED NOT DETECTED   Candida tropicalis NOT DETECTED NOT DETECTED    Pricilla Riffle, PharmD Pharmacy Resident  03/31/2019 9:36 AM

## 2019-03-31 NOTE — Progress Notes (Addendum)
Inpatient Diabetes Program Recommendations  AACE/ADA: New Consensus Statement on Inpatient Glycemic Control   Target Ranges:  Prepandial:   less than 140 mg/dL      Peak postprandial:   less than 180 mg/dL (1-2 hours)      Critically ill patients:  140 - 180 mg/dL  Results for [name], [name] (MRN [id number]) as of 03/31/2019 11:25  Ref. Range 03/30/2019 07:44 03/30/2019 11:57 03/30/2019 16:51 03/30/2019 22:05 03/31/2019 07:50  Glucose-Capillary Latest Ref Range: 70 - 99 mg/dL 952 (H) 841 (H) 324 (H) 211 (H) 135 (H)    Review of Glycemic Control  Diabetes history: DM2 Outpatient Diabetes medications: 70/30 30 units BID Current orders for Inpatient glycemic control: Levemir 20 units BID, Novolog 0-15 units TID with meals  Inpatient Diabetes Program Recommendations:   Insulin - Basal: In reviewing chart, noted patient only received Levemir 20 units one time on 03/30/19 and fasting glucose 135 mg/dl today. Morning dose of Levemir 20 units NOT GIVEN today. Patient prefers to resume his 70/30 at a lower dose while inpatient. Therefore, please discontinue Levemir and order 70/30 15 units BID with breakfast and supper (this dose will provide a total of 21 units for basal and 9 units for meal coverage per day).  NOTE: Noted patient only received Levemir 20 units once on 03/30/19 and fasting glucose 135 mg/dl today. Noted that Levemir 20 units scheduled for 10 am today was NOT GIVEN (RN charted patient confused about insulins and would prefer to take home insulin). Spoke with patient over the phone and he states that he normally takes 70/30 30 units BID at home and he states that when he asked about the insulin he is prescribed here, staff does not seem knowledgeable about difference in insulins. Discussed current insulin orders and discussed Levemir, Novolog, and 70/30 insulin in more detail. Patient states that he would prefer to resume taking 70/30 as an inpatient at a lower dose of 20 units BID. Patient  states he has a good appetite and is eating well. Informed patient that it would be requested that Levemir be discontinued and 70/30 be ordered at a lower dose BID. Patient verbalized understanding and states that he has no other questions or concerns at this time related to DM.   Thanks, Orlando Penner, RN, MSN, CDE Diabetes Coordinator Inpatient Diabetes Program [phone] (Team Pager from 8am to 5pm)

## 2019-03-31 NOTE — Progress Notes (Signed)
   Date of Admission:  03/28/2019      ID: [name] is a 43 y.o. male with   Active Problems:   Appendicitis   Acute gangrenous appendicitis    Subjective: Doing well  Medications:  . acetaminophen  500 mg Oral Q6H  . atorvastatin  20 mg Oral Daily  . celecoxib  200 mg Oral Daily  . enoxaparin (LOVENOX) injection  40 mg Subcutaneous Q24H  . gabapentin  600 mg Oral TID  . insulin aspart  0-15 Units Subcutaneous TID WC  . insulin aspart protamine- aspart  15 Units Subcutaneous BID  . lisinopril  30 mg Oral Daily    Objective: Vital signs in last 24 hours: Temp:  [98 F (36.7 C)-100.6 F (38.1 C)] 99.3 F (37.4 C) (05/12 1136) Pulse Rate:  [96-103] 103 (05/12 1136) Resp:  [16-18] 17 (05/12 1136) BP: (151-173)/(100-118) 151/100 (05/12 1136) SpO2:  [88 %-97 %] 94 % (05/12 1136)  PHYSICAL EXAM:  General: Alert, cooperative, no distress, appears stated age.  Head: Normocephalic, without obvious abnormality, atraumatic. Eyes: Conjunctivae clear, anicteric sclerae. Pupils are equal ENT Nares normal. No drainage or sinus tenderness. Lips, mucosa, and tongue normal. No Thrush Neck: Supple, symmetrical, no adenopathy, thyroid: non tender no carotid bruit and no JVD. Back: No CVA tenderness. Lungs: Clear to auscultation bilaterally. No Wheezing or Rhonchi. No rales. Heart: Regular rate and rhythm, no murmur, rub or gallop. Abdomen: Soft, minimal tenderness at the site of lap, glued. Bowel sounds normal. No masses Extremities: atraumatic, no cyanosis. No edema. No clubbing Skin: No rashes or lesions. Or bruising Lymph: Cervical, supraclavicular normal. Neurologic: Grossly non-focal  Lab Results Recent Labs    03/30/19 0810 03/31/19 0255  WBC 5.1 3.0*  HGB 12.2* 12.1*  HCT 36.0* 35.3*  NA 141 140  K 4.0 3.8  CL 110 107  CO2 25 26  BUN 19 13  CREATININE 1.37* 1.09   Liver Panel No results for input(s): PROT, ALBUMIN, AST, ALT, ALKPHOS, BILITOT, BILIDIR,  IBILI in the last 72 hours. Sedimentation Rate No results for input(s): ESRSEDRATE in the last 72 hours. C-Reactive Protein No results for input(s): CRP in the last 72 hours.  Microbiology:  Studies/Results: No results found.   Assessment/Plan: ?Acute appendicitis status post appendectomy. E. coli bacteremia.  Currently on meropenem.  pan sensitive-change to unasyn.  There was also a  gram positive rod in 1 bottle of 4 - could be corynebacterium but with gangrenous appendix D.D are clostridium ? Actino??  likely will have ID tomorrow  augmentin 875mg  PO BID  for days on discharge  CKD  Hypertension on lisinopril  Diabetes mellitus on insulin  Neuropathy on gabapentin  Discussed the management with the patient and pharmacist

## 2019-04-01 ENCOUNTER — Inpatient Hospital Stay: Payer: Medicare Other

## 2019-04-01 ENCOUNTER — Encounter: Payer: Self-pay | Admitting: Radiology

## 2019-04-01 DIAGNOSIS — D696 Thrombocytopenia, unspecified: Secondary | ICD-10-CM

## 2019-04-01 DIAGNOSIS — K429 Umbilical hernia without obstruction or gangrene: Secondary | ICD-10-CM

## 2019-04-01 DIAGNOSIS — R110 Nausea: Secondary | ICD-10-CM

## 2019-04-01 DIAGNOSIS — R51 Headache: Secondary | ICD-10-CM

## 2019-04-01 DIAGNOSIS — I10 Essential (primary) hypertension: Secondary | ICD-10-CM

## 2019-04-01 DIAGNOSIS — D72819 Decreased white blood cell count, unspecified: Secondary | ICD-10-CM

## 2019-04-01 DIAGNOSIS — K37 Unspecified appendicitis: Secondary | ICD-10-CM

## 2019-04-01 LAB — BASIC METABOLIC PANEL
Anion gap: 9 (ref 5–15)
BUN: 11 mg/dL (ref 6–20)
CO2: 24 mmol/L (ref 22–32)
Calcium: 8.6 mg/dL — ABNORMAL LOW (ref 8.9–10.3)
Chloride: 105 mmol/L (ref 98–111)
Creatinine, Ser: 1.13 mg/dL (ref 0.61–1.24)
GFR calc Af Amer: >60 mL/min (ref >60)
GFR calc non Af Amer: >60 mL/min (ref >60)
Glucose, Bld: 211 mg/dL — ABNORMAL HIGH (ref 70–99)
Potassium: 3.7 mmol/L (ref 3.5–5.1)
Sodium: 138 mmol/L (ref 135–145)

## 2019-04-01 LAB — CBC WITH DIFFERENTIAL/PLATELET
Abs Immature Granulocytes: 0.01 10*3/uL (ref 0.00–0.07)
Basophils Absolute: 0 10*3/uL (ref 0.0–0.1)
Basophils Relative: 1 %
Eosinophils Absolute: 0.1 10*3/uL (ref 0.0–0.5)
Eosinophils Relative: 3 %
HCT: 36.8 % — ABNORMAL LOW (ref 39.0–52.0)
Hemoglobin: 12.6 g/dL — ABNORMAL LOW (ref 13.0–17.0)
Immature Granulocytes: 0 %
Lymphocytes Relative: 21 %
Lymphs Abs: 0.8 10*3/uL (ref 0.7–4.0)
MCH: 28.1 pg (ref 26.0–34.0)
MCHC: 34.2 g/dL (ref 30.0–36.0)
MCV: 82 fL (ref 80.0–100.0)
Monocytes Absolute: 0.5 10*3/uL (ref 0.1–1.0)
Monocytes Relative: 12 %
Neutro Abs: 2.4 10*3/uL (ref 1.7–7.7)
Neutrophils Relative %: 63 %
Platelets: 106 10*3/uL — ABNORMAL LOW (ref 150–400)
RBC: 4.49 MIL/uL (ref 4.22–5.81)
RDW: 12.3 % (ref 11.5–15.5)
WBC: 3.8 10*3/uL — ABNORMAL LOW (ref 4.0–10.5)
nRBC: 0 % (ref 0.0–0.2)

## 2019-04-01 LAB — GLUCOSE, CAPILLARY
Glucose-Capillary: 163 mg/dL — ABNORMAL HIGH (ref 70–99)
Glucose-Capillary: 130 mg/dL — ABNORMAL HIGH (ref 70–99)
Glucose-Capillary: 130 mg/dL — ABNORMAL HIGH (ref 70–99)
Glucose-Capillary: 159 mg/dL — ABNORMAL HIGH (ref 70–99)

## 2019-04-01 LAB — MAGNESIUM: Magnesium: 1.8 mg/dL (ref 1.7–2.4)

## 2019-04-01 LAB — PHOSPHORUS: Phosphorus: 2.3 mg/dL — ABNORMAL LOW (ref 2.5–4.6)

## 2019-04-01 MED ORDER — POTASSIUM PHOSPHATE MONOBASIC 500 MG PO TABS
500.0000 mg | ORAL_TABLET | Freq: Three times a day (TID) | ORAL | Status: AC
  Administered 2019-04-01 (×2): 500 mg via ORAL
  Filled 2019-04-01 (×2): qty 1

## 2019-04-01 MED ORDER — LISINOPRIL 20 MG PO TABS
40.0000 mg | ORAL_TABLET | Freq: Every day | ORAL | Status: DC
  Administered 2019-04-02: 40 mg via ORAL
  Filled 2019-04-01: qty 2

## 2019-04-01 MED ORDER — LISINOPRIL 10 MG PO TABS
10.0000 mg | ORAL_TABLET | Freq: Once | ORAL | Status: AC
  Administered 2019-04-01: 15:00:00 10 mg via ORAL
  Filled 2019-04-01: qty 1

## 2019-04-01 MED ORDER — IOHEXOL 240 MG/ML SOLN
50.0000 mL | Freq: Once | INTRAMUSCULAR | Status: AC | PRN
  Administered 2019-04-01: 15:00:00 50 mL via ORAL

## 2019-04-01 MED ORDER — IBUPROFEN 400 MG PO TABS
200.0000 mg | ORAL_TABLET | Freq: Three times a day (TID) | ORAL | Status: DC | PRN
  Administered 2019-04-01: 200 mg via ORAL
  Filled 2019-04-01: qty 1

## 2019-04-01 MED ORDER — IOHEXOL 300 MG/ML  SOLN
100.0000 mL | Freq: Once | INTRAMUSCULAR | Status: AC | PRN
  Administered 2019-04-01: 100 mL via INTRAVENOUS

## 2019-04-01 MED ORDER — GABAPENTIN 600 MG PO TABS
900.0000 mg | ORAL_TABLET | Freq: Three times a day (TID) | ORAL | Status: DC
  Administered 2019-04-01 – 2019-04-02 (×3): 900 mg via ORAL
  Filled 2019-04-01 (×3): qty 2

## 2019-04-01 NOTE — Progress Notes (Signed)
ID Pt says he has headache Also having nausea Says his legs are burning  Vitals  Patient Vitals for the past 24 hrs:  BP Temp Temp src Pulse Resp SpO2  04/01/19 1227 (!) 155/98 98.7 F (37.1 C) Oral 92 18 94 %  04/01/19 1001 (!) 157/106 99.6 F (37.6 C) Oral 98 16 96 %  04/01/19 0407 (!) 144/98 98.8 F (37.1 C) Oral (!) 109 16 92 %  04/01/19 0248 (!) 154/112 - - (!) 102 - 94 %  03/31/19 2006 (!) 156/107 - - 97 - -  03/31/19 2002 (!) 166/113 98.8 F (37.1 C) Oral 98 16 93 %   Chest CTA HS S1s2 Abd soft Feet - no edema- DP felt  Abd soft- BS present  Impression-  E.coli bacteremia plus anerobic bacteremia- on Unasyn Both secondary to  appendicitis and he has undergone lap excision of the appendix and correction of umbilical hernia A slow grade fever planning for repeat CT to look for any abscess Continue IV unasyn  Appendicitis s/p lap resection-   HTN not well controlled , can be the reason for his headache- started on lisinopil  Leucopenia and thrombocytopenia could be due to his above infection- will also check HIV  Discussed the management with patient and  Dr.Sakai

## 2019-04-01 NOTE — Progress Notes (Signed)
Subjective:  CC: [name] is a 43 y.o. male, 4 Days Post-Op lap appy for gangernous appendicitis  HPI: Tolerating regular diet, Overall pain improving now but he is complaining of increased burning pain in bilateral feet.  Having BMs   ROS:  General: Denies weight loss, weight gain, fatigue, fevers, chills, and night sweats. Heart: Denies chest pain, palpitations, racing heart, irregular heartbeat, leg pain or swelling, and decreased activity tolerance. Respiratory: Denies breathing difficulty, shortness of breath, wheezing, cough, and sputum. GI: Denies change in appetite, heartburn, nausea, vomiting, constipation, diarrhea, and blood in stool. GU: Denies difficulty urinating, pain with urinating, urgency, frequency, blood in urine.   Objective:   Temp:  [98.7 F (37.1 C)-99.6 F (37.6 C)] 98.7 F (37.1 C) (05/13 1227) Pulse Rate:  [92-109] 92 (05/13 1227) Resp:  [16-18] 18 (05/13 1227) BP: (144-166)/(98-113) 155/98 (05/13 1227) SpO2:  [92 %-96 %] 94 % (05/13 1227)     Height: 6\' 1"  (185.4 cm) Weight: 102.1 kg BMI (Calculated): 29.69   Intake/Output this shift:   Intake/Output Summary (Last 24 hours) at 04/01/2019 1622 Last data filed at 04/01/2019 1343 Gross per 24 hour  Intake 459.44 ml  Output 350 ml  Net 109.44 ml    Constitutional :  alert, cooperative, appears stated age and no distress  Respiratory:  clear to auscultation bilaterally  Cardiovascular:  regular rate and rhythm  Gastrointestinal: Soft, no guarding, but moderate amount of tenderness still present in right lower quadrant and umbilical port site.  Incisions are clean dry and intact.   Skin: Cool and moist.   Bilateral feet with no obvious lesions, states burning improves with palpation.    Psychiatric: Normal affect, non-agitated, not confused       LABS:  CMP Latest Ref Rng & Units 04/01/2019 03/31/2019 03/30/2019  Glucose 70 - 99 mg/dL 956(O) 130(Q) 657(Q)  BUN 6 - 20 mg/dL 11 13 19   Creatinine  0.61 - 1.24 mg/dL 4.69 6.29 5.28(U)  Sodium 135 - 145 mmol/L 138 140 141  Potassium 3.5 - 5.1 mmol/L 3.7 3.8 4.0  Chloride 98 - 111 mmol/L 105 107 110  CO2 22 - 32 mmol/L 24 26 25   Calcium 8.9 - 10.3 mg/dL 1.3(K) 8.3(L) 8.1(L)  Total Protein 6.5 - 8.1 g/dL - - -  Total Bilirubin 0.3 - 1.2 mg/dL - - -  Alkaline Phos 38 - 126 U/L - - -  AST 15 - 41 U/L - - -  ALT 0 - 44 U/L - - -   CBC Latest Ref Rng & Units 04/01/2019 03/31/2019 03/30/2019  WBC 4.0 - 10.5 K/uL 3.8(L) 3.0(L) 5.1  Hemoglobin 13.0 - 17.0 g/dL 12.6(L) 12.1(L) 12.2(L)  Hematocrit 39.0 - 52.0 % 36.8(L) 35.3(L) 36.0(L)  Platelets 150 - 400 K/uL 106(L) 80(L) 85(L)    RADS: n/a Assessment:   S/p lap appy for gangernous appendicitis.  We will continue IV antibiotics while inhouse per ID recs.   Despite tolerating a regular diet patient still not recovering as expected and now complaining of increasing nausea.  Remains occasional tachycardia, and mild temp of 100.6  BLE US doppler negative for DVT.  Will proceed with CT abd/pelvis to ensure no developing abscess.  Bilateral feet pain, likely from worsening neuropathy, will increase gabapentin dose.  HTN- persistent SBP in 160s despite home dose lisinopril.  Will increase dosage and continue to monitor.

## 2019-04-02 LAB — BASIC METABOLIC PANEL
Anion gap: 8 (ref 5–15)
BUN: 11 mg/dL (ref 6–20)
CO2: 26 mmol/L (ref 22–32)
Calcium: 8.3 mg/dL — ABNORMAL LOW (ref 8.9–10.3)
Chloride: 103 mmol/L (ref 98–111)
Creatinine, Ser: 1.04 mg/dL (ref 0.61–1.24)
GFR calc Af Amer: >60 mL/min (ref >60)
GFR calc non Af Amer: >60 mL/min (ref >60)
Glucose, Bld: 172 mg/dL — ABNORMAL HIGH (ref 70–99)
Potassium: 3.3 mmol/L — ABNORMAL LOW (ref 3.5–5.1)
Sodium: 137 mmol/L (ref 135–145)

## 2019-04-02 LAB — CULTURE, BLOOD (ROUTINE X 2)

## 2019-04-02 LAB — CBC WITH DIFFERENTIAL/PLATELET
Abs Immature Granulocytes: 0.01 10*3/uL (ref 0.00–0.07)
Basophils Absolute: 0 10*3/uL (ref 0.0–0.1)
Basophils Relative: 0 %
Eosinophils Absolute: 0.2 10*3/uL (ref 0.0–0.5)
Eosinophils Relative: 5 %
HCT: 36.6 % — ABNORMAL LOW (ref 39.0–52.0)
Hemoglobin: 12.6 g/dL — ABNORMAL LOW (ref 13.0–17.0)
Immature Granulocytes: 0 %
Lymphocytes Relative: 30 %
Lymphs Abs: 1.3 10*3/uL (ref 0.7–4.0)
MCH: 28.3 pg (ref 26.0–34.0)
MCHC: 34.4 g/dL (ref 30.0–36.0)
MCV: 82.1 fL (ref 80.0–100.0)
Monocytes Absolute: 0.6 10*3/uL (ref 0.1–1.0)
Monocytes Relative: 14 %
Neutro Abs: 2.1 10*3/uL (ref 1.7–7.7)
Neutrophils Relative %: 51 %
Platelets: 123 10*3/uL — ABNORMAL LOW (ref 150–400)
RBC: 4.46 MIL/uL (ref 4.22–5.81)
RDW: 12.3 % (ref 11.5–15.5)
WBC: 4.2 10*3/uL (ref 4.0–10.5)
nRBC: 0 % (ref 0.0–0.2)

## 2019-04-02 LAB — MAGNESIUM: Magnesium: 1.9 mg/dL (ref 1.7–2.4)

## 2019-04-02 LAB — GLUCOSE, CAPILLARY: Glucose-Capillary: 163 mg/dL — ABNORMAL HIGH (ref 70–99)

## 2019-04-02 LAB — PHOSPHORUS: Phosphorus: 3.6 mg/dL (ref 2.5–4.6)

## 2019-04-02 MED ORDER — AMOXICILLIN-POT CLAVULANATE 875-125 MG PO TABS: 1.0000 | ORAL_TABLET | Freq: Two times a day (BID) | ORAL | 0 refills | Status: AC

## 2019-04-02 MED ORDER — DOCUSATE SODIUM 100 MG PO CAPS: 100.0000 mg | ORAL_CAPSULE | Freq: Two times a day (BID) | ORAL | 0 refills | Status: AC | PRN

## 2019-04-02 MED ORDER — ACETAMINOPHEN 325 MG PO TABS: 650.0000 mg | ORAL_TABLET | Freq: Three times a day (TID) | ORAL | 0 refills | Status: AC | PRN

## 2019-04-02 MED ORDER — HYDROCODONE-ACETAMINOPHEN 5-325 MG PO TABS: 1.0000 | ORAL_TABLET | Freq: Four times a day (QID) | ORAL | 0 refills | Status: AC | PRN

## 2019-04-02 MED ORDER — LISINOPRIL 40 MG PO TABS: 40.0000 mg | ORAL_TABLET | Freq: Every day | ORAL | 0 refills | Status: DC

## 2019-04-02 MED ORDER — GABAPENTIN 600 MG PO TABS: 900.0000 mg | ORAL_TABLET | Freq: Three times a day (TID) | ORAL | 0 refills | Status: DC

## 2019-04-02 NOTE — Progress Notes (Signed)
MD ordered patient to be discharged home.  Discharge instructions were reviewed with the patient and he voiced understanding.  Follow-up appointment was made.  Prescriptions sent to the patients pharmacy.  IV was removed with catheter intact.  All patients questions were answered.  Patient awaiting his ride to discharge

## 2019-04-02 NOTE — Discharge Instructions (Signed)
Laparoscopic Appendectomy, Care After This sheet gives you information about how to care for yourself after your procedure. Your doctor may also give you more specific instructions. If you have problems or questions, contact your doctor. Follow these instructions at home: Care for cuts from surgery (incisions)   Follow instructions from your doctor about how to take care of your cuts from surgery. Make sure you: ? Wash your hands with soap and water before you change your bandage (dressing). If you cannot use soap and water, use hand sanitizer. ? Change your bandage as told by your doctor. ? Leave stitches (sutures), skin glue, or skin tape (adhesive) strips in place. They may need to stay in place for 2 weeks or longer. If tape strips get loose and curl up, you may trim the loose edges. Do not remove tape strips completely unless your doctor says it is okay.  Do not take baths, swim, or use a hot tub until your doctor says it is okay. OK TO SHOWER 24HRS AFTER YOUR SURGERY.   Check your surgical cut area every day for signs of infection. Check for: ? More redness, swelling, or pain. ? More fluid or blood. ? Warmth. ? Pus or a bad smell. Activity  Do not drive or use heavy machinery while taking prescription pain medicine.  Do not play contact sports until your doctor says it is okay.  Do not drive for 24 hours if you were given a medicine to help you relax (sedative).  Rest as needed. Do not return to work or school until your doctor says it is okay. General instructions   tylenol and advil as needed for discomfort.  Please alternate between the two every four hours as needed for pain.     Use narcotics, if prescribed, only when tylenol and motrin is not enough to control pain.   325-650mg  every 8hrs to max of 4000mg /24hrs (including the 325mg  in every norco dose) for the tylenol.     Advil up to 800mg  per dose every 8hrs as needed for pain.    To prevent or treat constipation  while you are taking prescription pain medicine, your doctor may recommend that you: ? Drink enough fluid to keep your pee (urine) clear or pale yellow. ? Take over-the-counter or prescription medicines. ? Eat foods that are high in fiber, such as fresh fruits and vegetables, whole grains, and beans. ? Limit foods that are high in fat and processed sugars, such as fried and sweet foods. Contact a doctor if:  You develop a rash.  You have more redness, swelling, or pain around your surgical cuts.  You have more fluid or blood coming from your surgical cuts.  Your surgical cuts feel warm to the touch.  You have pus or a bad smell coming from your surgical cuts.  You have a fever.  One or more of your surgical cuts breaks open. Get help right away if:  You have trouble breathing.  You have chest pain.  You have pain that is getting worse in your shoulders.  You faint or feel dizzy when you stand.  You have very bad pain in your belly (abdomen).  You are sick to your stomach (nauseous) for more than one day.  You have throwing up (vomiting) that lasts for more than one day.  You have leg pain. This information is not intended to replace advice given to you by your health care provider. Make sure you discuss any questions you have with your  health care provider. Document Released: 08/14/2008 Document Revised: 05/26/2016 Document Reviewed: 04/23/2016 Elsevier Interactive Patient Education  2019 [employer].

## 2019-04-02 NOTE — Discharge Summary (Signed)
Physician Discharge Summary  Patient ID: [name] MRN: [id number] DOB/AGE: October 29, 1976 43 y.o.  Admit date: 03/28/2019 Discharge date: 04/02/2019  Admission Diagnoses: acute appendicitis  Discharge Diagnoses:  Same as above plus E.coli bacteremia, right basilar pneumonia, umbilical hernia, BLE neuropathy, DM, HTN,   Discharged Condition: good  Hospital Course: Diagnosed initially with acute appendicitis in the ED, underwent uneventful laparoscopic appendectomy for a gangrenous but nonperforated appendix.  While waiting recovery from the procedure, the initial sepsis work-up from the ED noted positive blood cultures for E. coli.,  Despite the appendix not being obviously perforated.  ID was consulted for antibiotic management.  Despite an uneventful surgery, patient remained continuously tachycardic and spiking low-grade fevers, so follow-up work-up including DVT ultrasound of lower extremities and CT abdomen pelvis was done.  Negative for DVT bilaterally, and CT showed no intra-abdominal pathology but incidental right lower lobe pneumonia and persistent umbilical hernia.  The persistent umbilical hernia likely is an over read on the CT scan, since the umbilical hernia was noted prior to the operation and it was through this hernia at that a laparoscopic was port was placed Intra-Op, and then primary closed afterwards.  Despite these findings on CT scan and abnormal vital signs, patient clinically remained stable, with gradual improvement in abdominal pain.  At time of discharge, vitals have stabilized, patient remains minimally symptomatic, tolerating a diet and having regular bowel movements.  Only concerning thing was possible worsening neuropathy in bilateral lower extremities, for which his gabapentin dosage was increased during his stay.  Blood pressure remains slightly elevated with systolic in the 160s, so his lisinopril dose was increased as well.  These issues can be further managed on  an outpatient basis.  He will finish his antibiotic course for his septicemia and pneumonia with Augmentin 875 mg p.o. twice daily for 7 more days per ID recommendations.  I also recommend that he follow-up with his primary for further management of his neuropathy and blood pressure issues along with his diabetes on an outpatient basis.  Consults: ID  Discharge Exam: Blood pressure (!) 155/114, pulse 99, temperature 98.1 F (36.7 C), temperature source Oral, resp. rate 20, height 6\' 1"  (1.854 m), weight 102.1 kg, SpO2 96 %. General appearance: alert, cooperative and no distress GI: soft, non-tender; bowel sounds normal; no masses,  no organomegaly Extremities: extremities normal, atraumatic, no cyanosis or edema  Disposition:  Discharge disposition: 01-Home or Self Care       Discharge Instructions    Discharge patient   Complete by:  As directed    Discharge disposition:  01-Home or Self Care   Discharge patient date:  04/02/2019     Allergies as of 04/02/2019      Reactions   Food    Chicken, eggs and peaches      Medication List    TAKE these medications   acetaminophen 325 MG tablet Commonly known as:  Tylenol Take 2 tablets (650 mg total) by mouth every 8 (eight) hours as needed for up to 30 days for mild pain.   amoxicillin-clavulanate 875-125 MG tablet Commonly known as:  Augmentin Take 1 tablet by mouth 2 (two) times daily for 7 days.   atorvastatin 20 MG tablet Commonly known as:  LIPITOR Take 20 mg by mouth daily.   docusate sodium 100 MG capsule Commonly known as:  Colace Take 1 capsule (100 mg total) by mouth 2 (two) times daily as needed for up to 10 days for mild constipation.  gabapentin 600 MG tablet Commonly known as:  NEURONTIN Take 1.5 tablets (900 mg total) by mouth 3 (three) times daily for 10 days. What changed:  how much to take   HYDROcodone-acetaminophen 5-325 MG tablet Commonly known as:  Norco Take 1 tablet by mouth every 6 (six)  hours as needed for up to 3 days for moderate pain.   ibuprofen 200 MG tablet Commonly known as:  ADVIL Take 200 mg by mouth every 6 (six) hours as needed.   INS SYRINGE/NEEDLE .5CC/27G 27G X 1/2" 0.5 ML Misc 1 Units by Does not apply route as needed.   lisinopril 40 MG tablet Commonly known as:  ZESTRIL Take 1 tablet (40 mg total) by mouth daily for 10 days. What changed:    medication strength  how much to take   NOVOLIN 70/30 RELION [place] Inject into the skin. 30u in the am,  30 u in the evening   RELION GLUCOSE PO Take by mouth. 3 times daily.      Follow-up Information    Emi Belfast, FNP. Schedule an appointment as soon as possible for a visit in 1 week(s).   Specialties:  Nurse Practitioner, Family Medicine Why:  for followup feet neuropathy and BP issues post hospitalization Contact information: [address] [place] [address] [phone]        Sung Amabile, DO Follow up in 2 week(s).   Specialty:  Surgery Why:  wound check Contact information: [address] [place] [address] [phone]            Total time spent arranging discharge was >92min. Signed: Sung Amabile 04/02/2019, 9:50 AM

## 2019-04-02 NOTE — Care Management Important Message (Signed)
Important Message  Patient Details  Name: [name] MRN: [id number] Date of Birth: 1976/02/10   Medicare Important Message Given:  No  Patient discharged prior to arrival to floor to deliver concurrent Medicare IM.   Johnell Comings 04/02/2019, 1:39 PM

## 2019-04-03 ENCOUNTER — Encounter: Payer: Self-pay | Admitting: Family Medicine

## 2019-04-03 ENCOUNTER — Ambulatory Visit (INDEPENDENT_AMBULATORY_CARE_PROVIDER_SITE_OTHER): Payer: Medicare Other | Admitting: Family Medicine

## 2019-04-03 VITALS — Wt 222.0 lb

## 2019-04-03 DIAGNOSIS — I10 Essential (primary) hypertension: Secondary | ICD-10-CM

## 2019-04-03 DIAGNOSIS — E1021 Type 1 diabetes mellitus with diabetic nephropathy: Secondary | ICD-10-CM | POA: Diagnosis not present

## 2019-04-03 DIAGNOSIS — Z09 Encounter for follow-up examination after completed treatment for conditions other than malignant neoplasm: Secondary | ICD-10-CM

## 2019-04-03 NOTE — Progress Notes (Signed)
Virtual Visit via Video Note  I connected with [name] on 04/03/19 at 10:30 AM EDT by a video enabled telemedicine application and verified that I am speaking with the correct person using two identifiers.  Location: Patient: In his home Provider: [facility]   I discussed the limitations of evaluation and management by telemedicine and the availability of in person appointments. The patient expressed understanding and agreed to proceed.  History of Present Illness: This is a 43 year old male who presents for video visit today for hospital follow-up.  The patient was admitted 03/28/2019 with acute appendicitis.  He underwent appendectomy 03/29/2019.  He had a sepsis work-up which showed positive blood cultures for E. coli.  ID was consulted.  He was discharged home with 7 days of Augmentin.  During hospitalization his blood pressure was elevated and his lisinopril was increased to 40 mg daily.Marland Kitchen  He was also having bilateral foot pain due to peripheral neuropathy and his gabapentin was increased from 600 mg 3 times daily to 900 mg 3 times daily. When I connected with him today he reports that he is feeling pretty good.  He reports that his abdomen is sore.  He denies any fever.  He reports that he has had a bowel movement in the last 24 hours and is passing gas.  He has had some recent low blood sugars and has decreased his insulin.  He is currently taking Novolin 70/30 twice a day.  He was on 34 units and has decreased this to 20.  He reports blood sugar are running 69-140.  He tries to take his insulin on a time schedule not necessarily related to meals.  He feels bad when his blood sugar is less than 70.  He drinks a glass of orange juice when this occurs.    Current Meds  Medication Sig  . acetaminophen (TYLENOL) 325 MG tablet Take 2 tablets (650 mg total) by mouth every 8 (eight) hours as needed for up to 30 days for mild pain.  Marland Kitchen amoxicillin-clavulanate (AUGMENTIN) 875-125 MG  tablet Take 1 tablet by mouth 2 (two) times daily for 7 days.  Marland Kitchen atorvastatin (LIPITOR) 20 MG tablet Take 20 mg by mouth daily.  Marland Kitchen Dextrose, Diabetic Use, (RELION GLUCOSE PO) Take by mouth. 3 times daily.  . DULoxetine (CYMBALTA) 20 MG capsule Take 2 capsules by mouth daily.  Marland Kitchen gabapentin (NEURONTIN) 600 MG tablet Take 1.5 tablets (900 mg total) by mouth 3 (three) times daily for 10 days.  Marland Kitchen HYDROcodone-acetaminophen (NORCO) 5-325 MG tablet Take 1 tablet by mouth every 6 (six) hours as needed for up to 3 days for moderate pain.  Marland Kitchen ibuprofen (ADVIL,MOTRIN) 200 MG tablet Take 200 mg by mouth every 6 (six) hours as needed.  . INS SYRINGE/NEEDLE .5CC/27G 27G X 1/2" 0.5 ML MISC 1 Units by Does not apply route as needed.  . Insulin NPH Isophane & Regular (NOVOLIN 70/30 RELION [place]) Inject into the skin. 20u in the am,  20 u in the evening  . lisinopril (ZESTRIL) 40 MG tablet Take 1 tablet (40 mg total) by mouth daily for 10 days.   Past Medical History:  Diagnosis Date  . Diabetes mellitus   . Hypertension    Past Surgical History:  Procedure Laterality Date  . ANKLE SURGERY     both   . back fusion     l4,l5  . BACK SURGERY    . LAPAROSCOPIC APPENDECTOMY N/A 03/28/2019   Procedure: APPENDECTOMY LAPAROSCOPIC;  Surgeon: Sung Amabile, DO;  Location: [facility] ORS;  Service: General;  Laterality: N/A;  . NASAL SINUS SURGERY     Family History  Problem Relation Age of Onset  . Hypertension Mother   . Diabetes Father    Social History   Tobacco Use  . Smoking status: Never Smoker  . Smokeless tobacco: Never Used  Substance Use Topics  . Alcohol use: No  . Drug use: No      Observations/Objective: Patient is alert and answers questions appropriately.  He is normally conversant questions and without shortness of breath.  Visible skin is unremarkable.  Mood and affect are appropriate. Wt 222 lb (100.7 kg) Comment: per patient  BMI 29.29 kg/m  Wt Readings from Last 3 Encounters:  04/03/19  222 lb (100.7 kg)  03/28/19 225 lb (102.1 kg)  02/16/19 222 lb (100.7 kg)    Assessment and Plan: 1. Hospital discharge follow-up -Reviewed return to clinic/ER follow-up precautions including fever, chills, severe pain, abdominal bloating.  2. 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  Hopefully this will help him avoid hypoglycemia.  Continued follow-up with endocrinology. -His plan is to take 20 units twice a day.  3. Essential hypertension -Lisinopril was increased in the hospital.  Patient reports that he has a automatic blood pressure cuff at home but needs to replace the batteries.  We discussed him checking his blood press blood pressure at home several times a week and sending me readings in 2 weeks. -Follow-up in the office in 4 weeks for bmet and blood pressure check   Olean Ree, FNP-BC  [facility], [facility]  04/03/2019 11:10 AM   Follow Up Instructions: Visit recap and instructions sent to patient via my chart   I discussed the assessment and treatment plan with the patient. The patient was provided an opportunity to ask questions and all were answered. The patient agreed with the plan and demonstrated an understanding of the instructions.   The patient was advised to call back or seek an in-person evaluation if the symptoms worsen or if the condition fails to improve as anticipated.   Emi Belfast, FNP

## 2019-04-08 ENCOUNTER — Ambulatory Visit: Payer: Medicare Other | Admitting: Family Medicine

## 2019-05-11 ENCOUNTER — Encounter: Payer: Self-pay | Admitting: Urology

## 2019-05-11 ENCOUNTER — Ambulatory Visit (INDEPENDENT_AMBULATORY_CARE_PROVIDER_SITE_OTHER): Payer: Medicare Other | Admitting: Urology

## 2019-05-11 ENCOUNTER — Encounter: Payer: Self-pay | Admitting: *Deleted

## 2019-05-11 ENCOUNTER — Other Ambulatory Visit: Payer: Self-pay

## 2019-05-11 VITALS — BP 124/85 | HR 92 | Ht 73.0 in | Wt 226.2 lb

## 2019-05-11 DIAGNOSIS — E1169 Type 2 diabetes mellitus with other specified complication: Secondary | ICD-10-CM | POA: Diagnosis not present

## 2019-05-11 DIAGNOSIS — Z794 Long term (current) use of insulin: Secondary | ICD-10-CM | POA: Diagnosis not present

## 2019-05-11 DIAGNOSIS — N5201 Erectile dysfunction due to arterial insufficiency: Secondary | ICD-10-CM | POA: Diagnosis not present

## 2019-05-11 DIAGNOSIS — E1165 Type 2 diabetes mellitus with hyperglycemia: Secondary | ICD-10-CM | POA: Diagnosis not present

## 2019-05-11 DIAGNOSIS — E785 Hyperlipidemia, unspecified: Secondary | ICD-10-CM | POA: Diagnosis not present

## 2019-05-11 MED ORDER — TADALAFIL 20 MG PO TABS: ORAL_TABLET | ORAL | 0 refills | Status: DC

## 2019-05-11 NOTE — Addendum Note (Signed)
Addended by: Verlene Mayer A on: 05/11/2019 03:56 PM   Modules accepted: Orders

## 2019-05-11 NOTE — Progress Notes (Signed)
05/11/2019 11:51 AM   [employer] [employer] Willinger 04/26/1976 [id number]  Referring provider: Elby Beck, [place],  [place] [address]  Chief Complaint  Patient presents with  . Erectile Dysfunction    HPI: [name] is a 43 year old male seen in consultation at the request of Clarene Reamer, FNP for evaluation of erectile dysfunction.  SHIM was 9/25 indicating moderate to severe ED.  He has erections that are firm enough for penetration less than 50% of the time and is unable to maintain the erection much greater than 50% of the time.  He states intercourse is almost never satisfactory.  He denies pain or curvature with erections.  Organic risk factors include a 7-year history of diabetes on insulin.  He does have diabetic neuropathy.  He also has hypertension and is on antihypertensive medications.  He has no voiding complaints. Denies dysuria, gross hematuria or flank/abdominal/pelvic/scrotal pain.  He has used generic sildenafil up to 80 mg without improvement.   PMH: Past Medical History:  Diagnosis Date  . Diabetes mellitus   . ED (erectile dysfunction)   . Hypertension     Surgical History: Past Surgical History:  Procedure Laterality Date  . ANKLE SURGERY     both   . back fusion     l4,l5  . BACK SURGERY    . LAPAROSCOPIC APPENDECTOMY N/A 03/28/2019   Procedure: APPENDECTOMY LAPAROSCOPIC;  Surgeon: Benjamine Sprague, DO;  Location: [facility] ORS;  Service: General;  Laterality: N/A;  . NASAL SINUS SURGERY      Home Medications:  Allergies as of 05/11/2019      Reactions   Food    Chicken, eggs and peaches      Medication List       Accurate as of May 11, 2019 11:51 AM. If you have any questions, ask your nurse or doctor.        atorvastatin 20 MG tablet Commonly known as: LIPITOR Take 20 mg by mouth daily.   DULoxetine 20 MG capsule Commonly known as: CYMBALTA Take 2 capsules by mouth daily.   gabapentin 600 MG tablet Commonly  known as: NEURONTIN Take 1.5 tablets (900 mg total) by mouth 3 (three) times daily for 10 days.   ibuprofen 200 MG tablet Commonly known as: ADVIL Take 200 mg by mouth every 6 (six) hours as needed.   INS SYRINGE/NEEDLE .5CC/27G 27G X 1/2" 0.5 ML Misc 1 Units by Does not apply route as needed.   lisinopril 40 MG tablet Commonly known as: ZESTRIL Take 1 tablet (40 mg total) by mouth daily for 10 days.   NovoLIN 70/30 FlexPen (70-30) 100 UNIT/ML PEN Generic drug: Insulin Isophane & Regular Human INJECT 10 UNITS SUBCUTANEOUSLY TWICE DAILY BEFORE MEAL(S). MAX OF 60 UNITS DAILY   RELION GLUCOSE PO Take by mouth. 3 times daily.   tadalafil 20 MG tablet Commonly known as: CIALIS 1 tab 60 min prior to intercourse Started by: Abbie Sons, MD       Allergies:  Allergies  Allergen Reactions  . Food     Chicken, eggs and peaches    Family History: Family History  Problem Relation Age of Onset  . Hypertension Mother   . Diabetes Father     Social History:  reports that he has never smoked. He has never used smokeless tobacco. He reports that he does not drink alcohol or use drugs.  ROS: UROLOGY Frequent Urination?: No Hard to postpone urination?: No Burning/pain with urination?:  No Get up at night to urinate?: No Leakage of urine?: No Urine stream starts and stops?: No Trouble starting stream?: No Do you have to strain to urinate?: No Blood in urine?: No Urinary tract infection?: No Sexually transmitted disease?: No Injury to kidneys or bladder?: No Painful intercourse?: No Weak stream?: No Erection problems?: Yes Penile pain?: No  Gastrointestinal Nausea?: No Vomiting?: No Indigestion/heartburn?: No Diarrhea?: No Constipation?: No  Constitutional Fever: No Night sweats?: No Weight loss?: No Fatigue?: No  Skin Skin rash/lesions?: No Itching?: No  Eyes Blurred vision?: No Double vision?: No  Ears/Nose/Throat Sore throat?: No Sinus  problems?: No  Hematologic/Lymphatic Swollen glands?: No Easy bruising?: No  Cardiovascular Leg swelling?: No Chest pain?: No  Respiratory Cough?: No Shortness of breath?: No  Endocrine Excessive thirst?: No  Musculoskeletal Back pain?: Yes Joint pain?: No  Neurological Headaches?: No Dizziness?: No  Psychologic Depression?: No Anxiety?: No  Physical Exam: BP 124/85 (BP Location: Left Arm, Patient Position: Sitting, Cuff Size: Large)   Pulse 92   Ht 6\' 1"  (1.854 m)   Wt 226 lb 3.2 oz (102.6 kg)   BMI 29.84 kg/m   Constitutional:  Alert and oriented, No acute distress. HEENT: Sharp AT, moist mucus membranes.  Trachea midline, no masses. Cardiovascular: No clubbing, cyanosis, or edema. Respiratory: Normal respiratory effort, no increased work of breathing. GI: Abdomen is soft, nontender, nondistended, no abdominal masses GU: No CVA tenderness.  Penis without lesions or plaques, testes descended bilaterally without masses or tenderness.  Cord/epididymis palpably normal bilaterally Skin: No rashes, bruises or suspicious lesions. Neurologic: Grossly intact, no focal deficits, moving all 4 extremities. Psychiatric: Normal mood and affect.   Assessment & Plan:   43 year old male with erectile dysfunction and significant organic risk factors.  We discussed options including a trial of tadalafil although it is less likely this will be effective since sildenafil has not been effective.  Intracavernosal injections and vacuum erection devices were discussed as well as penile prosthetic surgery.  WAVE therapy was also discussed although not offered in [facility].  He has initially elected a trial of tadalafil and was given a printed prescription.  If not effective he may be interested in intracavernosal injections.  He will call back regarding efficacy of the tadalafil.  He is fairly certain a testosterone level has been previously checked although I did not see one in  epic.  I would recommend a baseline testosterone/LH   Riki Altes, MD  [facility] [address] [place], [place] [address] [phone]

## 2019-05-13 ENCOUNTER — Ambulatory Visit: Payer: Self-pay | Admitting: Urology

## 2019-05-20 ENCOUNTER — Other Ambulatory Visit: Payer: Medicare Other

## 2019-05-20 ENCOUNTER — Telehealth: Payer: Self-pay

## 2019-05-20 ENCOUNTER — Other Ambulatory Visit: Payer: Self-pay

## 2019-05-20 DIAGNOSIS — N5201 Erectile dysfunction due to arterial insufficiency: Secondary | ICD-10-CM | POA: Diagnosis not present

## 2019-05-20 NOTE — Telephone Encounter (Signed)
Pt came into office today for labs, while here he states that the 20mg  of Cialis is not working for him, he states he was told that if oral meds didn't work he would need injections. Pt would like to proceed with that. Please advise. Thanks

## 2019-05-21 LAB — LUTEINIZING HORMONE: LH: 6 m[IU]/mL (ref 1.7–8.6)

## 2019-05-21 LAB — TESTOSTERONE: Testosterone: 306 ng/dL (ref 264–916)

## 2019-05-21 NOTE — Telephone Encounter (Signed)
Appointment with Larene Beach for injection training and [facility] the Trimix test dose vial

## 2019-05-25 ENCOUNTER — Telehealth: Payer: Self-pay

## 2019-05-25 DIAGNOSIS — I10 Essential (primary) hypertension: Secondary | ICD-10-CM | POA: Diagnosis not present

## 2019-05-25 DIAGNOSIS — E785 Hyperlipidemia, unspecified: Secondary | ICD-10-CM | POA: Diagnosis not present

## 2019-05-25 DIAGNOSIS — Z794 Long term (current) use of insulin: Secondary | ICD-10-CM | POA: Diagnosis not present

## 2019-05-25 DIAGNOSIS — E1165 Type 2 diabetes mellitus with hyperglycemia: Secondary | ICD-10-CM | POA: Diagnosis not present

## 2019-05-25 DIAGNOSIS — E1169 Type 2 diabetes mellitus with other specified complication: Secondary | ICD-10-CM | POA: Diagnosis not present

## 2019-05-25 DIAGNOSIS — E1159 Type 2 diabetes mellitus with other circulatory complications: Secondary | ICD-10-CM | POA: Diagnosis not present

## 2019-05-25 DIAGNOSIS — E1142 Type 2 diabetes mellitus with diabetic polyneuropathy: Secondary | ICD-10-CM | POA: Diagnosis not present

## 2019-05-25 NOTE — Telephone Encounter (Signed)
mychart notification sent 

## 2019-05-25 NOTE — Telephone Encounter (Signed)
-----   Message from Abbie Sons, MD sent at 05/23/2019 11:26 PM EDT ----- Testosterone level was low normal at 306

## 2019-05-28 ENCOUNTER — Other Ambulatory Visit: Payer: Self-pay

## 2019-05-28 DIAGNOSIS — N5201 Erectile dysfunction due to arterial insufficiency: Secondary | ICD-10-CM

## 2019-05-28 MED ORDER — AMBULATORY NON FORMULARY MEDICATION: 0 refills | Status: DC

## 2019-05-28 NOTE — Telephone Encounter (Signed)
RX printed and faxed. Message sent to admin pool to have pt scheduled.

## 2019-05-28 NOTE — Telephone Encounter (Signed)
Per [employer] RX was [facility] for test dose. Appt to be scheduled for teaching.

## 2019-06-08 DIAGNOSIS — R200 Anesthesia of skin: Secondary | ICD-10-CM | POA: Diagnosis not present

## 2019-06-08 DIAGNOSIS — R52 Pain, unspecified: Secondary | ICD-10-CM | POA: Diagnosis not present

## 2019-06-08 DIAGNOSIS — R202 Paresthesia of skin: Secondary | ICD-10-CM | POA: Diagnosis not present

## 2019-06-11 NOTE — Progress Notes (Signed)
[name] did not pick up the Trimix prescription, so he needed to be rescheduled.

## 2019-06-12 ENCOUNTER — Encounter: Payer: Self-pay | Admitting: Urology

## 2019-06-12 ENCOUNTER — Ambulatory Visit (INDEPENDENT_AMBULATORY_CARE_PROVIDER_SITE_OTHER): Payer: Medicare Other | Admitting: Urology

## 2019-06-12 ENCOUNTER — Other Ambulatory Visit: Payer: Self-pay

## 2019-06-12 ENCOUNTER — Other Ambulatory Visit: Payer: Self-pay | Admitting: Urology

## 2019-06-12 VITALS — BP 142/102 | HR 88 | Ht 73.0 in | Wt 226.0 lb

## 2019-06-12 DIAGNOSIS — N5201 Erectile dysfunction due to arterial insufficiency: Secondary | ICD-10-CM

## 2019-06-12 NOTE — Progress Notes (Signed)
Patient did not pick up Trimix medication and was rescheduled.

## 2019-07-21 DIAGNOSIS — H5213 Myopia, bilateral: Secondary | ICD-10-CM | POA: Diagnosis not present

## 2019-07-21 DIAGNOSIS — E113291 Type 2 diabetes mellitus with mild nonproliferative diabetic retinopathy without macular edema, right eye: Secondary | ICD-10-CM | POA: Diagnosis not present

## 2019-07-21 DIAGNOSIS — H524 Presbyopia: Secondary | ICD-10-CM | POA: Diagnosis not present

## 2019-07-21 DIAGNOSIS — E113212 Type 2 diabetes mellitus with mild nonproliferative diabetic retinopathy with macular edema, left eye: Secondary | ICD-10-CM | POA: Diagnosis not present

## 2019-07-21 LAB — HM DIABETES EYE EXAM

## 2019-08-07 ENCOUNTER — Encounter: Payer: Self-pay | Admitting: Family Medicine

## 2019-08-10 DIAGNOSIS — H35033 Hypertensive retinopathy, bilateral: Secondary | ICD-10-CM | POA: Diagnosis not present

## 2019-08-10 DIAGNOSIS — H35373 Puckering of macula, bilateral: Secondary | ICD-10-CM | POA: Diagnosis not present

## 2019-08-10 DIAGNOSIS — H43823 Vitreomacular adhesion, bilateral: Secondary | ICD-10-CM | POA: Diagnosis not present

## 2019-08-10 DIAGNOSIS — E113313 Type 2 diabetes mellitus with moderate nonproliferative diabetic retinopathy with macular edema, bilateral: Secondary | ICD-10-CM | POA: Diagnosis not present

## 2019-08-12 ENCOUNTER — Other Ambulatory Visit: Payer: Self-pay | Admitting: Family Medicine

## 2019-08-19 ENCOUNTER — Encounter: Payer: Self-pay | Admitting: Family Medicine

## 2019-08-19 ENCOUNTER — Ambulatory Visit (INDEPENDENT_AMBULATORY_CARE_PROVIDER_SITE_OTHER): Payer: Medicare Other | Admitting: Family Medicine

## 2019-08-19 VITALS — Ht 73.0 in | Wt 225.0 lb

## 2019-08-19 DIAGNOSIS — R05 Cough: Secondary | ICD-10-CM

## 2019-08-19 DIAGNOSIS — R058 Other specified cough: Secondary | ICD-10-CM

## 2019-08-19 DIAGNOSIS — R0602 Shortness of breath: Secondary | ICD-10-CM | POA: Diagnosis not present

## 2019-08-19 DIAGNOSIS — R0789 Other chest pain: Secondary | ICD-10-CM

## 2019-08-19 NOTE — Progress Notes (Signed)
Virtual Visit via Video Note  I connected with [name] on 08/19/19 at  4:00 PM EDT by a video enabled telemedicine application and verified that I am speaking with the correct person using two identifiers.  Location: Patient: In his home Provider: [place]   I discussed the limitations of evaluation and management by telemedicine and the availability of in person appointments. The patient expressed understanding and agreed to proceed.  We were initially able to see each other via video but the connection was poor and he had to complete the call by audio.  History of Present Illness: Chief Complaint  Patient presents with  . Chest Congestion    Pt c/o chest congestion at night when laying down. States that he hears a gurgling sound, worsening SOB. Very little coughing. Pt notes having chest heaviness all the time. Wakes up in the middle of night coughing at times, dry cough. Pt notes that his throat is sore in the mornings as if he slept with his mouth open all night.    This is a 43 yo male who presents with the above cc.  He reports that 1 week ago his symptoms began with a feeling of chest congestion, raspiness in his chest and wheezing when lying down.  He reports that his symptoms improved slightly after sitting up for about 30 minutes.  He has a constant tight feeling in his chest which is not improved with doing 3 puffs of his wife's albuterol inhaler.  He does not have a known history of asthma or reactive airway disease.  He has a constant sensation of pain in his sternum.  He has some mild shortness of breath at rest which is worsened with mild exercise.  He reports that he has no nasal drainage, no ear pain, sinus pain or pressure, very little postnasal drainage, no fever, no headaches no known Covid contacts although his son was tested last week and was negative.  He continues to be followed by endocrine for his diabetes.  He reports that generally his blood sugars  have been running 1 50-1 70 during the day with occasional lows in the 70s fasting.  Reports that he rarely has readings over 200 and usually this is after indulging in sweets.  He has upcoming lab work and appointment with endocrinology.  Past Medical History:  Diagnosis Date  . Diabetes mellitus   . ED (erectile dysfunction)   . Hypertension    Past Surgical History:  Procedure Laterality Date  . ANKLE SURGERY     both   . back fusion     l4,l5  . BACK SURGERY    . LAPAROSCOPIC APPENDECTOMY N/A 03/28/2019   Procedure: APPENDECTOMY LAPAROSCOPIC;  Surgeon: Benjamine Sprague, DO;  Location: [facility] ORS;  Service: General;  Laterality: N/A;  . NASAL SINUS SURGERY     Family History  Problem Relation Age of Onset  . Hypertension Mother   . Diabetes Father    Social History   Tobacco Use  . Smoking status: Never Smoker  . Smokeless tobacco: Never Used  Substance Use Topics  . Alcohol use: No  . Drug use: No      Observations/Objective: Patient is alert and answers questions appropriately.  He is able to converse in complete sentences.  There is no audible wheeze or witnessed cough.  His mood and affect are appropriate.  Ht 6\' 1"  (1.854 m)   Wt 225 lb (102.1 kg)   BMI 29.69 kg/m  Wt  Readings from Last 3 Encounters:  08/19/19 225 lb (102.1 kg)  06/12/19 226 lb (102.5 kg)  05/11/19 226 lb 3.2 oz (102.6 kg)   BP Readings from Last 3 Encounters:  06/12/19 (!) 142/102  05/11/19 124/85  04/02/19 (!) 155/114    Assessment and Plan: 1. Shortness of breath -This very uncharacteristic of the patient and concerning for possible PE, have ordered stat CT angiogram.  Discussed with patient who agreed. - CT ANGIO CHEST PE W OR WO CONTRAST; Future -Also discussed need to do COVID testing and he will go to drive-through testing site tomorrow morning.  2. Dry cough - CT ANGIO CHEST PE W OR WO CONTRAST; Future  3. Other chest pain - CT ANGIO CHEST PE W OR WO CONTRAST; Future   Olean Ree, FNP-BC  [facility], [facility]  08/19/2019 4:45 PM   Follow Up Instructions:    I discussed the assessment and treatment plan with the patient. The patient was provided an opportunity to ask questions and all were answered. The patient agreed with the plan and demonstrated an understanding of the instructions.   The patient was advised to call back or seek an in-person evaluation if the symptoms worsen or if the condition fails to improve as anticipated.   Emi Belfast, FNP

## 2019-08-20 ENCOUNTER — Ambulatory Visit
Admission: RE | Admit: 2019-08-20 | Discharge: 2019-08-20 | Disposition: A | Discharge status: Home / Self Care | Payer: Medicare Other | Source: Ambulatory Visit | Attending: Family Medicine | Admitting: Family Medicine

## 2019-08-20 ENCOUNTER — Other Ambulatory Visit: Payer: Self-pay

## 2019-08-20 ENCOUNTER — Telehealth: Payer: Self-pay | Admitting: Family Medicine

## 2019-08-20 DIAGNOSIS — R058 Other specified cough: Secondary | ICD-10-CM

## 2019-08-20 DIAGNOSIS — R05 Cough: Secondary | ICD-10-CM | POA: Insufficient documentation

## 2019-08-20 DIAGNOSIS — R0602 Shortness of breath: Secondary | ICD-10-CM

## 2019-08-20 DIAGNOSIS — R0789 Other chest pain: Secondary | ICD-10-CM

## 2019-08-20 DIAGNOSIS — R079 Chest pain, unspecified: Secondary | ICD-10-CM | POA: Diagnosis not present

## 2019-08-20 LAB — POCT I-STAT CREATININE: Creatinine, Ser: 1.4 mg/dL — ABNORMAL HIGH (ref 0.61–1.24)

## 2019-08-20 MED ORDER — IOHEXOL 350 MG/ML SOLN
75.0000 mL | Freq: Once | INTRAVENOUS | Status: AC | PRN
  Administered 2019-08-20: 75 mL via INTRAVENOUS

## 2019-08-20 NOTE — Telephone Encounter (Signed)
Wanted to route this to you so you can see his response

## 2019-08-20 NOTE — Telephone Encounter (Signed)
Called patient to check on him and see if he has had COVID testing. No answer. Will try again.

## 2019-08-21 ENCOUNTER — Other Ambulatory Visit: Payer: Self-pay

## 2019-08-21 DIAGNOSIS — Z20822 Contact with and (suspected) exposure to covid-19: Secondary | ICD-10-CM

## 2019-08-21 NOTE — Telephone Encounter (Signed)
Noted  

## 2019-08-22 LAB — NOVEL CORONAVIRUS, NAA: SARS-CoV-2, NAA: NOT DETECTED

## 2019-09-03 DIAGNOSIS — E1142 Type 2 diabetes mellitus with diabetic polyneuropathy: Secondary | ICD-10-CM | POA: Diagnosis not present

## 2019-09-03 DIAGNOSIS — E1159 Type 2 diabetes mellitus with other circulatory complications: Secondary | ICD-10-CM | POA: Diagnosis not present

## 2019-09-03 DIAGNOSIS — E785 Hyperlipidemia, unspecified: Secondary | ICD-10-CM | POA: Diagnosis not present

## 2019-09-03 DIAGNOSIS — Z794 Long term (current) use of insulin: Secondary | ICD-10-CM | POA: Diagnosis not present

## 2019-09-03 DIAGNOSIS — E1169 Type 2 diabetes mellitus with other specified complication: Secondary | ICD-10-CM | POA: Diagnosis not present

## 2019-09-03 DIAGNOSIS — I10 Essential (primary) hypertension: Secondary | ICD-10-CM | POA: Diagnosis not present

## 2019-09-03 DIAGNOSIS — E1165 Type 2 diabetes mellitus with hyperglycemia: Secondary | ICD-10-CM | POA: Diagnosis not present

## 2019-09-07 ENCOUNTER — Encounter: Payer: Self-pay | Admitting: Emergency Medicine

## 2019-09-07 ENCOUNTER — Other Ambulatory Visit: Payer: Self-pay

## 2019-09-07 ENCOUNTER — Emergency Department: Payer: Medicare Other

## 2019-09-07 ENCOUNTER — Emergency Department
Admission: EM | Admit: 2019-09-07 | Discharge: 2019-09-07 | Disposition: A | Discharge status: Home / Self Care | Payer: Medicare Other | Attending: Emergency Medicine | Admitting: Emergency Medicine

## 2019-09-07 DIAGNOSIS — Z794 Long term (current) use of insulin: Secondary | ICD-10-CM | POA: Insufficient documentation

## 2019-09-07 DIAGNOSIS — I110 Hypertensive heart disease with heart failure: Secondary | ICD-10-CM | POA: Diagnosis not present

## 2019-09-07 DIAGNOSIS — I509 Heart failure, unspecified: Secondary | ICD-10-CM | POA: Diagnosis not present

## 2019-09-07 DIAGNOSIS — R0602 Shortness of breath: Secondary | ICD-10-CM | POA: Diagnosis not present

## 2019-09-07 DIAGNOSIS — E1140 Type 2 diabetes mellitus with diabetic neuropathy, unspecified: Secondary | ICD-10-CM | POA: Diagnosis not present

## 2019-09-07 DIAGNOSIS — R079 Chest pain, unspecified: Secondary | ICD-10-CM | POA: Diagnosis not present

## 2019-09-07 DIAGNOSIS — Z79899 Other long term (current) drug therapy: Secondary | ICD-10-CM | POA: Diagnosis not present

## 2019-09-07 LAB — TROPONIN I (HIGH SENSITIVITY)
Troponin I (High Sensitivity): 23 ng/L — ABNORMAL HIGH (ref <18)
Troponin I (High Sensitivity): 24 ng/L — ABNORMAL HIGH (ref <18)

## 2019-09-07 LAB — CBC
HCT: 40.1 % (ref 39.0–52.0)
Hemoglobin: 13.2 g/dL (ref 13.0–17.0)
MCH: 28 pg (ref 26.0–34.0)
MCHC: 32.9 g/dL (ref 30.0–36.0)
MCV: 85 fL (ref 80.0–100.0)
Platelets: 136 10*3/uL — ABNORMAL LOW (ref 150–400)
RBC: 4.72 MIL/uL (ref 4.22–5.81)
RDW: 13.1 % (ref 11.5–15.5)
WBC: 4.3 10*3/uL (ref 4.0–10.5)
nRBC: 0 % (ref 0.0–0.2)

## 2019-09-07 LAB — BASIC METABOLIC PANEL
Anion gap: 7 (ref 5–15)
BUN: 18 mg/dL (ref 6–20)
CO2: 27 mmol/L (ref 22–32)
Calcium: 8.8 mg/dL — ABNORMAL LOW (ref 8.9–10.3)
Chloride: 107 mmol/L (ref 98–111)
Creatinine, Ser: 1.31 mg/dL — ABNORMAL HIGH (ref 0.61–1.24)
GFR calc Af Amer: >60 mL/min (ref >60)
GFR calc non Af Amer: >60 mL/min (ref >60)
Glucose, Bld: 229 mg/dL — ABNORMAL HIGH (ref 70–99)
Potassium: 3.8 mmol/L (ref 3.5–5.1)
Sodium: 141 mmol/L (ref 135–145)

## 2019-09-07 LAB — BRAIN NATRIURETIC PEPTIDE: B Natriuretic Peptide: 765 pg/mL — ABNORMAL HIGH (ref 0.0–100.0)

## 2019-09-07 MED ORDER — SODIUM CHLORIDE 0.9% FLUSH: 3.0000 mL | Freq: Once | INTRAVENOUS | Status: DC

## 2019-09-07 MED ORDER — FUROSEMIDE 20 MG PO TABS: 20.0000 mg | ORAL_TABLET | Freq: Every day | ORAL | 0 refills | Status: DC

## 2019-09-07 MED ORDER — FUROSEMIDE 10 MG/ML IJ SOLN
INTRAMUSCULAR | Status: AC
  Filled 2019-09-07: qty 4

## 2019-09-07 MED ORDER — FUROSEMIDE 10 MG/ML IJ SOLN
40.0000 mg | Freq: Once | INTRAMUSCULAR | Status: AC
  Administered 2019-09-07: 40 mg via INTRAVENOUS

## 2019-09-07 NOTE — Discharge Instructions (Addendum)
Your chest x-ray and lab work are concerning for possible early heart failure (which means that your heart muscle is not pumping as strong as it should be) that can cause fluid buildup in the lungs.  Take the Lasix daily until you follow-up with a cardiologist.  Dr. Tyrell Antonio office should contact you within the next 1 to 2 days to schedule an appointment this week.  If you do not hear from them by Wednesday, you should call and let them know that you were seen in the ED and need follow-up this week.  Return to the ER immediately for new, worsening, or persistent severe chest pain, shortness of breath, weakness or lightheadedness, or any other new or worsening symptoms that concern you.

## 2019-09-07 NOTE — ED Triage Notes (Signed)
Pt states midsternal cp that began 2 weeks ago, was tested for Covid 2 weeks ago with a negative result. States occasional cough, but continues to feel a heaviness on his chest like a brick is sitting on it. States it woke him up from his sleep last night. Intermittent "heavy breathing in triage, easily controlled with calming verbalization by this RN. Denies fevers. CT chest done 2 weeks ago and results are in chart. NAD at this time.

## 2019-09-07 NOTE — ED Provider Notes (Signed)
[facility] Department Provider Note ____________________________________________   First MD Initiated Contact with Patient 09/07/19 1259     (approximate)  I have reviewed the triage vital signs and the nursing notes.   HISTORY  Chief Complaint Chest Pain    HPI [name] is a 43 y.o. male with PMH as noted below who presents with chest discomfort for the last 2 weeks, substernal, nonradiating, described as tightness, and nonexertional.  He states that he feels it all the time.  It is not worse with changing positions.  He denies fever or significant shortness of breath although he does state that his chest feels congested.  He has tried his wife's albuterol inhaler, as well as Mucinex, with no relief.  He denies vomiting or diarrhea.  He has no leg swelling.  Past Medical History:  Diagnosis Date  . Diabetes mellitus   . ED (erectile dysfunction)   . Hypertension     Patient Active Problem List   Diagnosis Date Noted  . Acute gangrenous appendicitis 03/30/2019  . Appendicitis 03/29/2019  . Chest pressure 02/10/2019  . Cough 02/10/2019  . Diabetic polyneuropathy associated with type 2 diabetes mellitus (HCC) 07/21/2018  . Hyperlipidemia associated with type 2 diabetes mellitus (HCC) 07/21/2018  . S/P arthroscopy of shoulder 06/01/2014  . Knee pain 06/29/2013  . Pain in joint, shoulder region 06/29/2013  . 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 Allergic rhinitis, cause unspecified 03/17/2013  . Unspecified essential hypertension 03/17/2013    Past Surgical History:  Procedure Laterality Date  . ANKLE SURGERY     both   . back fusion     l4,l5  . BACK SURGERY    . LAPAROSCOPIC APPENDECTOMY N/A 03/28/2019   Procedure: APPENDECTOMY LAPAROSCOPIC;  Surgeon: Sung Amabile, DO;  Location: [facility] ORS;  Service: General;  Laterality: N/A;  . NASAL SINUS SURGERY      Prior to Admission medications    Medication Sig Start Date End Date Taking? Authorizing Provider  atorvastatin (LIPITOR) 20 MG tablet Take 1 tablet by mouth once daily 08/12/19   Emi Belfast, FNP  Dextrose, Diabetic Use, (RELION GLUCOSE PO) Take by mouth. 3 times daily.    [provider]  DULoxetine (CYMBALTA) 20 MG capsule Take 2 capsules by mouth daily. 03/03/19   [provider]  furosemide (LASIX) 20 MG tablet Take 1 tablet (20 mg total) by mouth daily for 15 days. 09/07/19 09/22/19  Dionne Bucy, MD  gabapentin (NEURONTIN) 600 MG tablet Take 1.5 tablets (900 mg total) by mouth 3 (three) times daily for 10 days. 04/02/19 08/19/19  Tonna Boehringer, Isami, DO  ibuprofen (ADVIL,MOTRIN) 200 MG tablet Take 200 mg by mouth every 6 (six) hours as needed.    [provider]  INS SYRINGE/NEEDLE .5CC/27G 27G X 1/2" 0.5 ML MISC 1 Units by Does not apply route as needed. 06/02/18   Emi Belfast, FNP  lisinopril (ZESTRIL) 40 MG tablet Take 1 tablet (40 mg total) by mouth daily for 10 days. 04/02/19 08/19/19  Sakai, Isami, DO  NOVOLIN 70/30 FLEXPEN (70-30) 100 UNIT/ML PEN INJECT 10 UNITS SUBCUTANEOUSLY TWICE DAILY BEFORE MEAL(S). MAX OF 60 UNITS DAILY 04/13/19   [provider]  rOPINIRole (REQUIP) 0.25 MG tablet Take 1 tablet by mouth 3 (three) times daily. 06/08/19   [provider]    Allergies Food  Family History  Problem Relation Age of Onset  . Hypertension Mother   . Diabetes  Father     Social History Social History   Tobacco Use  . Smoking status: Never Smoker  . Smokeless tobacco: Never Used  Substance Use Topics  . Alcohol use: No  . Drug use: No    Review of Systems  Constitutional: No fever. Eyes: No redness. ENT: No neck pain. Cardiovascular: Positive for chest pain. Respiratory: Denies shortness of breath. Gastrointestinal: No vomiting or diarrhea.  Genitourinary: Negative for flank pain.  Musculoskeletal: Negative for back pain. Skin: Negative for rash.  Neurological: Negative for headache.   ____________________________________________   PHYSICAL EXAM:  VITAL SIGNS: ED Triage Vitals  Enc Vitals Group     BP 09/07/19 1219 (!) 141/115     Pulse Rate 09/07/19 1219 92     Resp 09/07/19 1219 16     Temp 09/07/19 1219 98.6 F (37 C)     Temp Source 09/07/19 1219 Oral     SpO2 09/07/19 1219 100 %     Weight 09/07/19 1217 213 lb (96.6 kg)     Height 09/07/19 1217 6\' 1"  (1.854 m)     Head Circumference --      Peak Flow --      Pain Score 09/07/19 1216 3     Pain Loc --      Pain Edu? --      Excl. in GC? --     Constitutional: Alert and oriented. Well appearing and in no acute distress. Eyes: Conjunctivae are normal.  Head: Atraumatic. Nose: No congestion/rhinnorhea. Mouth/Throat: Mucous membranes are moist.   Neck: Normal range of motion.  Cardiovascular: Normal rate, regular rhythm. Grossly normal heart sounds.  Good peripheral circulation. Respiratory: Normal respiratory effort.  No retractions. Lungs CTAB. Gastrointestinal: No distention.  Musculoskeletal: Extremities warm and well perfused.  Neurologic:  Normal speech and language. No gross focal neurologic deficits are appreciated.  Skin:  Skin is warm and dry. No rash noted. Psychiatric: Mood and affect are normal. Speech and behavior are normal.  ____________________________________________   LABS (all labs ordered are listed, but only abnormal results are displayed)  Labs Reviewed  BASIC METABOLIC PANEL - Abnormal; Notable for the following components:      Result Value   Glucose, Bld 229 (*)    Creatinine, Ser 1.31 (*)    Calcium 8.8 (*)    All other components within normal limits  CBC - Abnormal; Notable for the following components:   Platelets 136 (*)    All other components within normal limits  TROPONIN I (HIGH SENSITIVITY) - Abnormal; Notable for the following components:   Troponin I (High Sensitivity) 23 (*)    All other components within normal  limits  BRAIN NATRIURETIC PEPTIDE  TROPONIN I (HIGH SENSITIVITY)   ____________________________________________  EKG  ED ECG REPORT I, 09/09/19, the attending physician, personally viewed and interpreted this ECG.  Date: 09/07/2019 EKG Time: 1211 Rate: 91 Rhythm: normal sinus rhythm QRS Axis: normal Intervals: normal ST/T Wave abnormalities: Nonspecific lateral T wave inversions Narrative Interpretation: Nonspecific T wave abnormalities laterally (new T wave inversions when compared to EKG of 11/07/2015)  ____________________________________________  RADIOLOGY  CXR: New bilateral hazy opacities concerning for edema  ____________________________________________   PROCEDURES  Procedure(s) performed: No  Procedures  Critical Care performed: No ____________________________________________   INITIAL IMPRESSION / ASSESSMENT AND PLAN / ED COURSE  Pertinent labs & imaging results that were available during my care of the patient were reviewed by me and considered in my medical decision making (see chart  for details).  43 year old male with PMH as noted above including hypertension and diabetes presents with atypical chest discomfort over the last several weeks which is constant, nonexertional, and described as a feeling of congestion.  He has tried albuterol and Mucinex with no relief.  I reviewed the past medical records in epic.  The patient was evaluated on 08/19/2019 with his PMD for the same symptom which he described as having been for a week already at that time.  He subsequently had a CT angio chest which showed no signs of PE and no other acute findings, and he had a negative COVID-19 test.  On exam the patient is relatively well-appearing.  His vital signs are normal except for borderline hypertension.  Physical exam is otherwise unremarkable.  EKG shows T wave inversions in V4-V6 which are new when compared to an EKG from 2016 although I do not have any  more recent ones to compare to.  Based on the time course and nature of the pain I have a low suspicion for ACS.  There is no clinical evidence to suggest PE, and the patient is PERC negative.  Also since this is the same symptom for which she was already evaluated a few weeks ago, with a negative CT there is no indication for repeat PE evaluation at this time.  Differential includes musculoskeletal or other benign etiology of chest pain, GERD, or possible bronchitis.  We will obtain chest x-ray, lab work-up including troponins x2, and reassess.  ----------------------------------------- 4:22 PM on 09/07/2019 -----------------------------------------  Initial troponin was minimally elevated, and the repeat shows no significant rise.  The patient's BNP is in the 700s, and his chest x-ray shows bilateral hazy opacities concerning for edema.  Overall presentation and work-up is concerning for new onset CHF.  The patient has no fever, elevated WBC count, or other findings to suggest infectious etiology.  The patient is comfortable appearing, with no increased work of breathing and a normal O2 saturation.  He has appeared comfortable throughout his ED stay.  At this time, there is no evidence of acute ischemia or respiratory decompensation, and he does not require admission.  I consulted Dr. Fletcher Anon from cardiology who recommended starting the patient on a low-dose of Lasix and follow-up this week, which he stated that he would arrange.  I had an extensive discussion with the patient about the results of his work-up and the likely cause of his symptoms as well as the follow-up plan.  He agrees with the plan.  Return precautions provided, and he expresses understanding.  He is stable for discharge at this time.  ____________________________________________   FINAL CLINICAL IMPRESSION(S) / ED DIAGNOSES  Final diagnoses:  Congestive heart failure, unspecified HF chronicity, unspecified heart failure type  (HCC)      NEW MEDICATIONS STARTED DURING THIS VISIT:  New Prescriptions   FUROSEMIDE (LASIX) 20 MG TABLET    Take 1 tablet (20 mg total) by mouth daily for 15 days.     Note:  This document was prepared using Dragon voice recognition software and may include unintentional dictation errors.    Arta Silence, MD 09/07/19 1624

## 2019-09-07 NOTE — ED Notes (Signed)
Pt eating a sandwich tray. Pt denies chest pain and states he has neuropathy and his legs just feel heavy. Pt says he has trouble breathing at times. No signs of distress. Pt able to speak in full sentences. Pt tolerating meal well.

## 2019-09-07 NOTE — ED Notes (Signed)
Dr Kerman Passey requested pt be brought back to a room to see MD. Marya Amsler charge nurse made aware, pt taken to [facility]

## 2019-09-07 NOTE — ED Notes (Signed)
RN Chrissy made aware of pt taken to 12h.

## 2019-09-10 ENCOUNTER — Encounter: Payer: Self-pay | Admitting: Cardiology

## 2019-09-10 ENCOUNTER — Ambulatory Visit (INDEPENDENT_AMBULATORY_CARE_PROVIDER_SITE_OTHER): Payer: Medicare Other | Admitting: Cardiology

## 2019-09-10 ENCOUNTER — Other Ambulatory Visit: Payer: Self-pay

## 2019-09-10 VITALS — BP 146/110 | HR 93 | Ht 73.0 in | Wt 234.0 lb

## 2019-09-10 DIAGNOSIS — I10 Essential (primary) hypertension: Secondary | ICD-10-CM

## 2019-09-10 DIAGNOSIS — R0602 Shortness of breath: Secondary | ICD-10-CM

## 2019-09-10 DIAGNOSIS — R079 Chest pain, unspecified: Secondary | ICD-10-CM

## 2019-09-10 MED ORDER — LISINOPRIL 40 MG PO TABS: 40.0000 mg | ORAL_TABLET | Freq: Every day | ORAL | 3 refills | Status: DC

## 2019-09-10 NOTE — Patient Instructions (Signed)
Medication Instructions:  1- RESTART Lisinopril Take 1 tablet (40 mg total) by mouth daily. *If you need a refill on your cardiac medications before your next appointment, please call your pharmacy*  Lab Work: None ordered  If you have labs (blood work) drawn today and your tests are completely normal, you will receive your results only by: Marland Kitchen MyChart Message (if you have MyChart) OR . A paper copy in the mail If you have any lab test that is abnormal or we need to change your treatment, we will call you to review the results.  Testing/Procedures: 1- Echo  Please return to [facility] on ______________ at _______________ AM/PM for an Echocardiogram. Your physician has requested that you have an echocardiogram. Echocardiography is a painless test that uses sound waves to create images of your heart. It provides your doctor with information about the size and shape of your heart and how well your heart's chambers and valves are working. This procedure takes approximately one hour. There are no restrictions for this procedure. Please note; depending on visual quality an IV may need to be placed.   2- [facility]  Your caregiver has ordered a Stress Test with nuclear imaging. The purpose of this test is to evaluate the blood supply to your heart muscle. This procedure is referred to as a "Non-Invasive Stress Test." This is because other than having an IV started in your vein, nothing is inserted or "invades" your body. Cardiac stress tests are done to find areas of poor blood flow to the heart by determining the extent of coronary artery disease (CAD). Some patients exercise on a treadmill, which naturally increases the blood flow to your heart, while others who are  unable to walk on a treadmill due to physical limitations have a pharmacologic/chemical stress agent called Lexiscan . This medicine will mimic walking on a treadmill by temporarily increasing your coronary blood flow.    Please note: these test may take anywhere between 2-4 hours to complete  PLEASE REPORT TO [place] AT THE FIRST DESK WILL DIRECT YOU WHERE TO GO  Date of Procedure:_____________________________________  Arrival Time for Procedure:______________________________  Instructions regarding medication:    __x__:  Hold other medications as follows:_____Lasix and Novalin the morning of procedure._____   PLEASE NOTIFY THE OFFICE AT LEAST 24 HOURS IN ADVANCE IF YOU ARE UNABLE TO KEEP YOUR APPOINTMENT.  [phone] AND  PLEASE NOTIFY NUCLEAR MEDICINE AT [facility] AT LEAST 24 HOURS IN ADVANCE IF YOU ARE UNABLE TO KEEP YOUR APPOINTMENT. [phone]  How to prepare for your Myoview test:  1. Do not eat or drink after midnight 2. No caffeine for 24 hours prior to test 3. No smoking 24 hours prior to test. 4. Your medication may be taken with water.  If your doctor stopped a medication because of this test, do not take that medication. 5. Ladies, please do not wear dresses.  Skirts or pants are appropriate. Please wear a short sleeve shirt. 6. No perfume, cologne or lotion. 7. Wear comfortable walking shoes. No heels!    Follow-Up: At [facility], you and your health needs are our priority.  As part of our continuing mission to provide you with exceptional heart care, we have created designated Provider Care Teams.  These Care Teams include your primary Cardiologist (physician) and Advanced Practice Providers (APPs -  Physician Assistants and Nurse Practitioners) who all work together to provide you with the care you need, when you need  it.  Your next appointment:   2 months  The format for your next appointment:   In Person  Provider:    You may see Debbe Odea, MD or one of the following Advanced Practice Providers on your designated Care Team:    Nicolasa Ducking, NP  Eula Listen, PA-C  Marisue Ivan, PA-C

## 2019-09-10 NOTE — Progress Notes (Signed)
Cardiology Office Note:    Date:  09/10/2019   ID:  [name], DOB [birth year], MRN [id number]  PCP:  Emi Belfast, FNP  Cardiologist:  Debbe Odea, MD  Electrophysiologist:  None   Referring MD: Emi Belfast, FNP   Chief Complaint  Patient presents with  . New Patient (Initial Visit)    Patient c/o chest tightness and SOB. Meds reviewed verbally with patient.     History of Present Illness:    [name] is a 44 y.o. male with a hx of hypertension, diabetes, hyperlipidemia recently evaluated in the emergency department due to chest pain.  Patient states having a 3-week history of chest discomfort.  He describes discomfort as pressure, rates it as a 6 out of 10 in intensity not related with exertion.  He also notes shortness of breath and coughing which is worsened.  He finds it hard to sleep because of shortness of breath when he lays on his back or on his side.  Denies edema, denies any personal or family history of heart disease.  3 days ago he presented to the emergency room due to chest discomfort.  In the emergency room, initial troponins were minimally elevated and BNP was in the 700s.  His chest x-ray showed bilateral hazy opacities concerning for edema.  Etiology of his symptoms was deemed resulting from new onset CHF.  He was given Lasix 20 mg daily and advised to follow-up with cardiology.  He states taking lisinopril for blood pressure but at a reduced dose than what is currently listed which is 40 mg daily.  He does not know his current dose.  Past Medical History:  Diagnosis Date  . Diabetes mellitus   . ED (erectile dysfunction)   . Hypertension     Past Surgical History:  Procedure Laterality Date  . ANKLE SURGERY     both   . back fusion     l4,l5  . BACK SURGERY    . LAPAROSCOPIC APPENDECTOMY N/A 03/28/2019   Procedure: APPENDECTOMY LAPAROSCOPIC;  Surgeon: Sung Amabile, DO;  Location: [facility] ORS;  Service: General;  Laterality: N/A;   . NASAL SINUS SURGERY      Current Medications: Current Meds  Medication Sig  . atorvastatin (LIPITOR) 20 MG tablet Take 1 tablet by mouth once daily  . Dextrose, Diabetic Use, (RELION GLUCOSE PO) Take by mouth. 3 times daily.  . DULoxetine (CYMBALTA) 20 MG capsule Take 2 capsules by mouth daily.  . furosemide (LASIX) 20 MG tablet Take 1 tablet (20 mg total) by mouth daily for 15 days.  Marland Kitchen ibuprofen (ADVIL,MOTRIN) 200 MG tablet Take 200 mg by mouth every 6 (six) hours as needed.  . INS SYRINGE/NEEDLE .5CC/27G 27G X 1/2" 0.5 ML MISC 1 Units by Does not apply route as needed.  Marland Kitchen NOVOLIN 70/30 FLEXPEN (70-30) 100 UNIT/ML PEN INJECT 10 UNITS SUBCUTANEOUSLY TWICE DAILY BEFORE MEAL(S). MAX OF 60 UNITS DAILY  . rOPINIRole (REQUIP) 0.25 MG tablet Take 1 tablet by mouth 3 (three) times daily.     Allergies:   Food   Social History   Socioeconomic History  . Marital status: Married    Spouse name: Not on file  . Number of children: Not on file  . Years of education: Not on file  . Highest education level: Not on file  Occupational History  . Not on file  Social Needs  . Financial resource strain: Not on file  . Food insecurity    Worry:  Not on file    Inability: Not on file  . Transportation needs    Medical: Not on file    Non-medical: Not on file  Tobacco Use  . Smoking status: Never Smoker  . Smokeless tobacco: Never Used  Substance and Sexual Activity  . Alcohol use: No  . Drug use: No  . Sexual activity: Yes    Birth control/protection: None  Lifestyle  . Physical activity    Days per week: Not on file    Minutes per session: Not on file  . Stress: Not on file  Relationships  . Social Musician on phone: Not on file    Gets together: Not on file    Attends religious service: Not on file    Active member of club or organization: Not on file    Attends meetings of clubs or organizations: Not on file    Relationship status: Not on file  Other Topics  Concern  . Not on file  Social History Narrative  . Not on file     Family History: The patient's family history includes Diabetes in his father; Hypertension in his mother.  ROS:   Please see the history of present illness.     All other systems reviewed and are negative.  EKGs/Labs/Other Studies Reviewed:    The following studies were reviewed today:   EKG:  EKG is  ordered today.  The ekg ordered today demonstrates normal sinus rhythm, LVH, possible LA enlargement, T wave inversions in the inferior lateral leads.  Recent Labs: 03/28/2019: ALT 20 04/02/2019: Magnesium 1.9 09/07/2019: B Natriuretic Peptide 765.0; BUN 18; Creatinine, Ser 1.31; Hemoglobin 13.2; Platelets 136; Potassium 3.8; Sodium 141  Recent Lipid Panel    Component Value Date/Time   CHOL 177 05/26/2018 1509   TRIG 81.0 05/26/2018 1509   HDL 57.00 05/26/2018 1509   CHOLHDL 3 05/26/2018 1509   VLDL 16.2 05/26/2018 1509   LDLCALC 104 (H) 05/26/2018 1509    Physical Exam:    VS:  BP (!) 146/110 (BP Location: Right Arm, Patient Position: Sitting, Cuff Size: Normal)   Pulse 93   Ht 6\' 1"  (1.854 m)   Wt 234 lb (106.1 kg)   BMI 30.87 kg/m     Wt Readings from Last 3 Encounters:  09/10/19 234 lb (106.1 kg)  09/07/19 213 lb (96.6 kg)  08/19/19 225 lb (102.1 kg)     GEN:  Well nourished, well developed in no acute distress HEENT: Normal NECK: No JVD; No carotid bruits LYMPHATICS: No lymphadenopathy CARDIAC: RRR, no murmurs, rubs, gallops RESPIRATORY:  Clear to auscultation without rales, wheezing or rhonchi  ABDOMEN: Soft, non-tender, non-distended MUSCULOSKELETAL:  No edema; No deformity  SKIN: Warm and dry NEUROLOGIC:  Alert and oriented x 3 PSYCHIATRIC:  Normal affect   ASSESSMENT:   Patient with history of hypertension, diabetes, hyperlipidemia presenting with chest discomfort and shortness of breath.  His ECG shows LVH, his vitals reveal uncontrolled blood pressures.   1. Chest pain,  unspecified type   2. Shortness of breath   3. Essential hypertension    PLAN:    In order of problems listed above:  1. Lexiscan myocardial perfusion imaging. 2. Get echocardiogram. 3. Increase lisinopril to 40 mg daily.  Follow-up after echo and Lexiscan.  Total encounter time more than 60 minutes  Greater than 50% was spent in counseling and coordination of care with the patient   Medication Adjustments/Labs and Tests Ordered: Current medicines are  reviewed at length with the patient today.  Concerns regarding medicines are outlined above.  Orders Placed This Encounter  Procedures  . NM Myocar Multi W/Spect W/Wall Motion / EF  . EKG 12-Lead  . ECHOCARDIOGRAM COMPLETE   Meds ordered this encounter  Medications  . DISCONTD: lisinopril (ZESTRIL) 40 MG tablet    Sig: Take 1 tablet (40 mg total) by mouth daily for 10 days.    Dispense:  90 tablet    Refill:  3  . lisinopril (ZESTRIL) 40 MG tablet    Sig: Take 1 tablet (40 mg total) by mouth daily.    Dispense:  90 tablet    Refill:  3    There are no Patient Instructions on file for this visit.   Signed, Kate Sable, MD  09/10/2019 11:25 AM    [place]

## 2019-09-14 ENCOUNTER — Emergency Department: Payer: Medicare Other

## 2019-09-14 ENCOUNTER — Inpatient Hospital Stay (HOSPITAL_COMMUNITY): Payer: Medicare Other

## 2019-09-14 ENCOUNTER — Other Ambulatory Visit: Payer: Self-pay

## 2019-09-14 ENCOUNTER — Inpatient Hospital Stay
Admission: EM | Admit: 2019-09-14 | Discharge: 2019-09-19 | DRG: 291 | Disposition: A | Discharge status: Home Health | Payer: Medicare Other | Attending: Internal Medicine | Admitting: Internal Medicine

## 2019-09-14 DIAGNOSIS — R0601 Orthopnea: Secondary | ICD-10-CM | POA: Diagnosis not present

## 2019-09-14 DIAGNOSIS — I509 Heart failure, unspecified: Secondary | ICD-10-CM | POA: Insufficient documentation

## 2019-09-14 DIAGNOSIS — I420 Dilated cardiomyopathy: Secondary | ICD-10-CM | POA: Diagnosis not present

## 2019-09-14 DIAGNOSIS — E1022 Type 1 diabetes mellitus with diabetic chronic kidney disease: Secondary | ICD-10-CM | POA: Diagnosis present

## 2019-09-14 DIAGNOSIS — E1040 Type 1 diabetes mellitus with diabetic neuropathy, unspecified: Secondary | ICD-10-CM | POA: Diagnosis present

## 2019-09-14 DIAGNOSIS — R0602 Shortness of breath: Secondary | ICD-10-CM

## 2019-09-14 DIAGNOSIS — Z794 Long term (current) use of insulin: Secondary | ICD-10-CM

## 2019-09-14 DIAGNOSIS — E785 Hyperlipidemia, unspecified: Secondary | ICD-10-CM | POA: Diagnosis present

## 2019-09-14 DIAGNOSIS — Z79899 Other long term (current) drug therapy: Secondary | ICD-10-CM | POA: Diagnosis not present

## 2019-09-14 DIAGNOSIS — Z20828 Contact with and (suspected) exposure to other viral communicable diseases: Secondary | ICD-10-CM | POA: Diagnosis not present

## 2019-09-14 DIAGNOSIS — R0789 Other chest pain: Secondary | ICD-10-CM

## 2019-09-14 DIAGNOSIS — I428 Other cardiomyopathies: Secondary | ICD-10-CM | POA: Diagnosis not present

## 2019-09-14 DIAGNOSIS — N182 Chronic kidney disease, stage 2 (mild): Secondary | ICD-10-CM | POA: Diagnosis present

## 2019-09-14 DIAGNOSIS — Z981 Arthrodesis status: Secondary | ICD-10-CM | POA: Diagnosis not present

## 2019-09-14 DIAGNOSIS — E1021 Type 1 diabetes mellitus with diabetic nephropathy: Secondary | ICD-10-CM | POA: Diagnosis present

## 2019-09-14 DIAGNOSIS — E876 Hypokalemia: Secondary | ICD-10-CM | POA: Diagnosis not present

## 2019-09-14 DIAGNOSIS — I10 Essential (primary) hypertension: Secondary | ICD-10-CM | POA: Diagnosis not present

## 2019-09-14 DIAGNOSIS — Z8249 Family history of ischemic heart disease and other diseases of the circulatory system: Secondary | ICD-10-CM | POA: Diagnosis not present

## 2019-09-14 DIAGNOSIS — I110 Hypertensive heart disease with heart failure: Secondary | ICD-10-CM

## 2019-09-14 DIAGNOSIS — I130 Hypertensive heart and chronic kidney disease with heart failure and stage 1 through stage 4 chronic kidney disease, or unspecified chronic kidney disease: Principal | ICD-10-CM | POA: Diagnosis present

## 2019-09-14 DIAGNOSIS — N179 Acute kidney failure, unspecified: Secondary | ICD-10-CM | POA: Diagnosis present

## 2019-09-14 DIAGNOSIS — I340 Nonrheumatic mitral (valve) insufficiency: Secondary | ICD-10-CM | POA: Diagnosis not present

## 2019-09-14 DIAGNOSIS — I43 Cardiomyopathy in diseases classified elsewhere: Secondary | ICD-10-CM | POA: Diagnosis not present

## 2019-09-14 DIAGNOSIS — Z833 Family history of diabetes mellitus: Secondary | ICD-10-CM

## 2019-09-14 DIAGNOSIS — I5021 Acute systolic (congestive) heart failure: Secondary | ICD-10-CM

## 2019-09-14 DIAGNOSIS — E1165 Type 2 diabetes mellitus with hyperglycemia: Secondary | ICD-10-CM | POA: Diagnosis not present

## 2019-09-14 LAB — CBC WITH DIFFERENTIAL/PLATELET
Abs Immature Granulocytes: 0.01 10*3/uL (ref 0.00–0.07)
Basophils Absolute: 0 10*3/uL (ref 0.0–0.1)
Basophils Relative: 1 %
Eosinophils Absolute: 0.1 10*3/uL (ref 0.0–0.5)
Eosinophils Relative: 2 %
HCT: 41 % (ref 39.0–52.0)
Hemoglobin: 13.8 g/dL (ref 13.0–17.0)
Immature Granulocytes: 0 %
Lymphocytes Relative: 22 %
Lymphs Abs: 0.9 10*3/uL (ref 0.7–4.0)
MCH: 28.5 pg (ref 26.0–34.0)
MCHC: 33.7 g/dL (ref 30.0–36.0)
MCV: 84.5 fL (ref 80.0–100.0)
Monocytes Absolute: 0.3 10*3/uL (ref 0.1–1.0)
Monocytes Relative: 8 %
Neutro Abs: 2.6 10*3/uL (ref 1.7–7.7)
Neutrophils Relative %: 67 %
Platelets: 169 10*3/uL (ref 150–400)
RBC: 4.85 MIL/uL (ref 4.22–5.81)
RDW: 13.4 % (ref 11.5–15.5)
WBC: 3.9 10*3/uL — ABNORMAL LOW (ref 4.0–10.5)
nRBC: 0 % (ref 0.0–0.2)

## 2019-09-14 LAB — NM MYOCAR MULTI W/SPECT W/WALL MOTION / EF
Estimated workload: 1 METS
Exercise duration (min): 0 min
Exercise duration (sec): 0 s
LV dias vol: 224 mL (ref 62–150)
LV sys vol: 179 mL
MPHR: 177 {beats}/min
Peak HR: 101 {beats}/min
Percent HR: 57 %
Rest HR: 97 {beats}/min
SDS: 0
SRS: 6
SSS: 2
TID: 1.17

## 2019-09-14 LAB — HEMOGLOBIN A1C
Hgb A1c MFr Bld: 7.4 % — ABNORMAL HIGH (ref 4.8–5.6)
Mean Plasma Glucose: 165.68 mg/dL

## 2019-09-14 LAB — BASIC METABOLIC PANEL
Anion gap: 8 (ref 5–15)
BUN: 20 mg/dL (ref 6–20)
CO2: 26 mmol/L (ref 22–32)
Calcium: 8.7 mg/dL — ABNORMAL LOW (ref 8.9–10.3)
Chloride: 109 mmol/L (ref 98–111)
Creatinine, Ser: 1.27 mg/dL — ABNORMAL HIGH (ref 0.61–1.24)
GFR calc Af Amer: >60 mL/min (ref >60)
GFR calc non Af Amer: >60 mL/min (ref >60)
Glucose, Bld: 114 mg/dL — ABNORMAL HIGH (ref 70–99)
Potassium: 3.7 mmol/L (ref 3.5–5.1)
Sodium: 143 mmol/L (ref 135–145)

## 2019-09-14 LAB — GLUCOSE, CAPILLARY
Glucose-Capillary: 129 mg/dL — ABNORMAL HIGH (ref 70–99)
Glucose-Capillary: 99 mg/dL (ref 70–99)
Glucose-Capillary: 153 mg/dL — ABNORMAL HIGH (ref 70–99)
Glucose-Capillary: 183 mg/dL — ABNORMAL HIGH (ref 70–99)

## 2019-09-14 LAB — SARS CORONAVIRUS 2 BY RT PCR (HOSPITAL ORDER, PERFORMED IN ~~LOC~~ HOSPITAL LAB): SARS Coronavirus 2: NEGATIVE

## 2019-09-14 LAB — SARS CORONAVIRUS 2 (TAT 6-24 HRS): SARS Coronavirus 2: NEGATIVE

## 2019-09-14 LAB — TROPONIN I (HIGH SENSITIVITY)
Troponin I (High Sensitivity): 26 ng/L — ABNORMAL HIGH (ref <18)
Troponin I (High Sensitivity): 22 ng/L — ABNORMAL HIGH (ref <18)

## 2019-09-14 LAB — HIV ANTIBODY (ROUTINE TESTING W REFLEX): HIV Screen 4th Generation wRfx: NONREACTIVE

## 2019-09-14 LAB — BRAIN NATRIURETIC PEPTIDE: B Natriuretic Peptide: 1014 pg/mL — ABNORMAL HIGH (ref 0.0–100.0)

## 2019-09-14 MED ORDER — DULOXETINE HCL 20 MG PO CPEP
40.0000 mg | ORAL_CAPSULE | Freq: Every day | ORAL | Status: DC
  Administered 2019-09-14 – 2019-09-19 (×6): 40 mg via ORAL
  Filled 2019-09-14 (×7): qty 2

## 2019-09-14 MED ORDER — NITROGLYCERIN 0.4 MG SL SUBL
SUBLINGUAL_TABLET | SUBLINGUAL | Status: AC
  Filled 2019-09-14: qty 1

## 2019-09-14 MED ORDER — INSULIN ASPART 100 UNIT/ML ~~LOC~~ SOLN
0.0000 [IU] | Freq: Three times a day (TID) | SUBCUTANEOUS | Status: DC
  Administered 2019-09-14: 2 [IU] via SUBCUTANEOUS
  Administered 2019-09-15: 1 [IU] via SUBCUTANEOUS
  Administered 2019-09-15 (×2): 3 [IU] via SUBCUTANEOUS
  Administered 2019-09-16 (×3): 2 [IU] via SUBCUTANEOUS
  Administered 2019-09-17 (×2): 3 [IU] via SUBCUTANEOUS
  Administered 2019-09-17 – 2019-09-18 (×2): 1 [IU] via SUBCUTANEOUS
  Administered 2019-09-18 (×2): 2 [IU] via SUBCUTANEOUS
  Administered 2019-09-19: 5 [IU] via SUBCUTANEOUS
  Administered 2019-09-19: 2 [IU] via SUBCUTANEOUS
  Filled 2019-09-14 (×15): qty 1

## 2019-09-14 MED ORDER — CARVEDILOL 6.25 MG PO TABS
6.2500 mg | ORAL_TABLET | Freq: Two times a day (BID) | ORAL | Status: DC
  Administered 2019-09-14 – 2019-09-17 (×6): 6.25 mg via ORAL
  Filled 2019-09-14 (×6): qty 1

## 2019-09-14 MED ORDER — INSULIN GLARGINE 100 UNIT/ML ~~LOC~~ SOLN
5.0000 [IU] | Freq: Every day | SUBCUTANEOUS | Status: DC
  Administered 2019-09-15 – 2019-09-18 (×5): 5 [IU] via SUBCUTANEOUS
  Filled 2019-09-14 (×6): qty 0.05

## 2019-09-14 MED ORDER — CARVEDILOL 6.25 MG PO TABS: 3.1250 mg | ORAL_TABLET | Freq: Two times a day (BID) | ORAL | Status: DC

## 2019-09-14 MED ORDER — SODIUM CHLORIDE 0.9% FLUSH
3.0000 mL | INTRAVENOUS | Status: DC | PRN
  Administered 2019-09-14 – 2019-09-16 (×4): 3 mL via INTRAVENOUS
  Filled 2019-09-14 (×4): qty 3

## 2019-09-14 MED ORDER — SODIUM CHLORIDE 0.9 % IV SOLN: 250.0000 mL | INTRAVENOUS | Status: DC | PRN

## 2019-09-14 MED ORDER — FUROSEMIDE 10 MG/ML IJ SOLN: 40.0000 mg | Freq: Every day | INTRAMUSCULAR | Status: DC

## 2019-09-14 MED ORDER — INSULIN ASPART 100 UNIT/ML ~~LOC~~ SOLN
0.0000 [IU] | Freq: Every day | SUBCUTANEOUS | Status: DC
  Administered 2019-09-17: 2 [IU] via SUBCUTANEOUS
  Filled 2019-09-14: qty 1

## 2019-09-14 MED ORDER — TECHNETIUM TC 99M TETROFOSMIN IV KIT
10.0000 | PACK | Freq: Once | INTRAVENOUS | Status: AC | PRN
  Administered 2019-09-14: 9.856 via INTRAVENOUS

## 2019-09-14 MED ORDER — ATORVASTATIN CALCIUM 20 MG PO TABS
40.0000 mg | ORAL_TABLET | Freq: Every day | ORAL | Status: DC
  Administered 2019-09-14 – 2019-09-18 (×5): 40 mg via ORAL
  Filled 2019-09-14 (×5): qty 2

## 2019-09-14 MED ORDER — LISINOPRIL 10 MG PO TABS: 40.0000 mg | ORAL_TABLET | Freq: Every day | ORAL | Status: DC

## 2019-09-14 MED ORDER — NITROGLYCERIN 0.4 MG SL SUBL
0.4000 mg | SUBLINGUAL_TABLET | SUBLINGUAL | Status: DC | PRN
  Administered 2019-09-14 (×2): 0.4 mg via SUBLINGUAL

## 2019-09-14 MED ORDER — ONDANSETRON HCL 4 MG/2ML IJ SOLN
4.0000 mg | Freq: Four times a day (QID) | INTRAMUSCULAR | Status: DC | PRN
  Administered 2019-09-15 – 2019-09-18 (×3): 4 mg via INTRAVENOUS
  Filled 2019-09-14 (×3): qty 2

## 2019-09-14 MED ORDER — ALUM & MAG HYDROXIDE-SIMETH 200-200-20 MG/5ML PO SUSP
30.0000 mL | ORAL | Status: DC | PRN
  Administered 2019-09-14: 30 mL via ORAL
  Filled 2019-09-14: qty 30

## 2019-09-14 MED ORDER — TECHNETIUM TC 99M TETROFOSMIN IV KIT
30.0000 | PACK | Freq: Once | INTRAVENOUS | Status: AC | PRN
  Administered 2019-09-14: 30.383 via INTRAVENOUS

## 2019-09-14 MED ORDER — SODIUM CHLORIDE 0.9% FLUSH
3.0000 mL | Freq: Two times a day (BID) | INTRAVENOUS | Status: DC
  Administered 2019-09-14 – 2019-09-19 (×11): 3 mL via INTRAVENOUS

## 2019-09-14 MED ORDER — ENOXAPARIN SODIUM 40 MG/0.4ML ~~LOC~~ SOLN
40.0000 mg | SUBCUTANEOUS | Status: DC
  Administered 2019-09-14 – 2019-09-19 (×6): 40 mg via SUBCUTANEOUS
  Filled 2019-09-14 (×7): qty 0.4

## 2019-09-14 MED ORDER — FUROSEMIDE 10 MG/ML IJ SOLN
40.0000 mg | Freq: Once | INTRAMUSCULAR | Status: AC
  Administered 2019-09-14: 40 mg via INTRAVENOUS
  Filled 2019-09-14: qty 4

## 2019-09-14 MED ORDER — ACETAMINOPHEN 325 MG PO TABS
650.0000 mg | ORAL_TABLET | ORAL | Status: DC | PRN
  Administered 2019-09-16 – 2019-09-18 (×3): 650 mg via ORAL
  Filled 2019-09-14 (×3): qty 2

## 2019-09-14 MED ORDER — FUROSEMIDE 10 MG/ML IJ SOLN
40.0000 mg | Freq: Two times a day (BID) | INTRAMUSCULAR | Status: DC
  Administered 2019-09-14 – 2019-09-16 (×4): 40 mg via INTRAVENOUS
  Filled 2019-09-14 (×4): qty 4

## 2019-09-14 MED ORDER — REGADENOSON 0.4 MG/5ML IV SOLN
0.4000 mg | Freq: Once | INTRAVENOUS | Status: AC
  Administered 2019-09-14: 0.4 mg via INTRAVENOUS
  Filled 2019-09-14: qty 5

## 2019-09-14 MED ORDER — HYDRALAZINE HCL 25 MG PO TABS
25.0000 mg | ORAL_TABLET | Freq: Three times a day (TID) | ORAL | Status: DC
  Administered 2019-09-14 – 2019-09-15 (×5): 25 mg via ORAL
  Filled 2019-09-14 (×5): qty 1

## 2019-09-14 MED ORDER — ROPINIROLE HCL 0.25 MG PO TABS
0.2500 mg | ORAL_TABLET | Freq: Three times a day (TID) | ORAL | Status: DC
  Administered 2019-09-14 – 2019-09-19 (×15): 0.25 mg via ORAL
  Filled 2019-09-14 (×20): qty 1

## 2019-09-14 NOTE — H&P (Signed)
[facility] NAME: [name]    MR#:  [id number]  DATE OF BIRTH:  June 18, 1976  DATE OF ADMISSION:  09/14/2019  PRIMARY CARE PHYSICIAN: Elby Beck, FNP   REQUESTING/REFERRING PHYSICIAN: Blake Divine, MD  CHIEF COMPLAINT:   Chief Complaint  Patient presents with   Shortness of Breath    HISTORY OF PRESENT ILLNESS:  [name]  is a 43 y.o. male with a known history of hypertension and type 2 diabetes who presented to the ED with shortness of breath for the last 3 weeks. He was seen in the ED on 09/07/19 for chest pain and shortness of breath. He was started on lasix 20mg  daily and was referred to cardiology. He was seen in cardiology clinic on 09/10/19. He was ordered for a lexiscan myoview and an ECHO. He has not had either of these done. His shortness of breath has been persistent, so he came to the ED for further management. He has been taking lasix as prescribed and is having good urine output. He denies any chest pain currently. He endorses some occasional lower extremity edema and palpitations. He endorses orthopnea and dry cough. No fevers or chills.  In the ED, he was mildly hypertensive. Labs were significant for creatinine 1.27, troponin 26. CXR with mild stable cardiomegaly and mild vascular congestion. He was given Lasix 40mg  IV x 1. Hospitalists were called for admission.   PAST MEDICAL HISTORY:   Past Medical History:  Diagnosis Date   Diabetes mellitus    ED (erectile dysfunction)    Hypertension     PAST SURGICAL HISTORY:   Past Surgical History:  Procedure Laterality Date   ANKLE SURGERY     both    back fusion     l4,l5   BACK SURGERY     LAPAROSCOPIC APPENDECTOMY N/A 03/28/2019   Procedure: APPENDECTOMY LAPAROSCOPIC;  Surgeon: Benjamine Sprague, DO;  Location: [facility] ORS;  Service: General;  Laterality: N/A;   NASAL SINUS SURGERY      SOCIAL HISTORY:   Social History   Tobacco  Use   Smoking status: Never Smoker   Smokeless tobacco: Never Used  Substance Use Topics   Alcohol use: No    FAMILY HISTORY:   Family History  Problem Relation Age of Onset   Hypertension Mother    Diabetes Father     DRUG ALLERGIES:   Allergies  Allergen Reactions   Food     Chicken, eggs and peaches    REVIEW OF SYSTEMS:   Review of Systems  Constitutional: Positive for malaise/fatigue. Negative for chills and fever.  HENT: Negative for congestion and sore throat.   Eyes: Negative for blurred vision and double vision.  Respiratory: Positive for cough and shortness of breath. Negative for sputum production.   Cardiovascular: Positive for orthopnea and leg swelling. Negative for chest pain and palpitations.  Gastrointestinal: Negative for nausea and vomiting.  Genitourinary: Negative for dysuria and urgency.  Musculoskeletal: Negative for back pain and neck pain.  Neurological: Negative for dizziness and headaches.  Psychiatric/Behavioral: Negative for depression. The patient is not nervous/anxious.     MEDICATIONS AT HOME:   Prior to Admission medications   Medication Sig Start Date End Date Taking? Authorizing Provider  atorvastatin (LIPITOR) 20 MG tablet Take 1 tablet by mouth once daily 08/12/19   Elby Beck, FNP  Dextrose, Diabetic Use, (RELION GLUCOSE PO) Take by mouth. 3 times daily.    [provider]  DULoxetine (CYMBALTA) 20 MG capsule Take 2 capsules by mouth daily. 03/03/19   [provider]  furosemide (LASIX) 20 MG tablet Take 1 tablet (20 mg total) by mouth daily for 15 days. 09/07/19 09/22/19  Dionne Bucy, MD  gabapentin (NEURONTIN) 600 MG tablet Take 1.5 tablets (900 mg total) by mouth 3 (three) times daily for 10 days. 04/02/19 08/19/19  Tonna Boehringer, Isami, DO  ibuprofen (ADVIL,MOTRIN) 200 MG tablet Take 200 mg by mouth every 6 (six) hours as needed.    [provider]  INS SYRINGE/NEEDLE .5CC/27G 27G X 1/2" 0.5  ML MISC 1 Units by Does not apply route as needed. 06/02/18   Emi Belfast, FNP  lisinopril (ZESTRIL) 40 MG tablet Take 1 tablet (40 mg total) by mouth daily. 09/10/19   Debbe Odea, MD  NOVOLIN 70/30 FLEXPEN (70-30) 100 UNIT/ML PEN INJECT 10 UNITS SUBCUTANEOUSLY TWICE DAILY BEFORE MEAL(S). MAX OF 60 UNITS DAILY 04/13/19   [provider]  rOPINIRole (REQUIP) 0.25 MG tablet Take 1 tablet by mouth 3 (three) times daily. 06/08/19   [provider]      VITAL SIGNS:  Blood pressure (!) 149/116, pulse 97, resp. rate (!) 21, height 6' 1.5" (1.867 m), weight 104.3 kg, SpO2 97 %.  PHYSICAL EXAMINATION:  Physical Exam  GENERAL:  43 y.o.-year-old patient lying in the bed with no acute distress.  EYES: Pupils equal, round, reactive to light and accommodation. No scleral icterus. Extraocular muscles intact.  HEENT: Head atraumatic, normocephalic. Oropharynx and nasopharynx clear.  NECK:  Supple, no jugular venous distention. No thyroid enlargement, no tenderness.  LUNGS: +diminished breath sounds in the lung bases bilaterally. No use of accessory muscles of respiration.  CARDIOVASCULAR: RRR, S1, S2 normal. No murmurs, rubs, or gallops.  ABDOMEN: Soft, nontender, nondistended. Bowel sounds present. No organomegaly or mass.  EXTREMITIES: No cyanosis, or clubbing. +trace pedal edema NEUROLOGIC: Cranial nerves II through XII are intact. Muscle strength 5/5 in all extremities. Sensation intact. Gait not checked.  PSYCHIATRIC: The patient is alert and oriented x 3.  SKIN: No obvious rash, lesion, or ulcer.   LABORATORY PANEL:   CBC Recent Labs  Lab 09/14/19 0627  WBC 3.9*  HGB 13.8  HCT 41.0  PLT 169   ------------------------------------------------------------------------------------------------------------------  Chemistries  Recent Labs  Lab 09/14/19 0627  NA 143  K 3.7  CL 109  CO2 26  GLUCOSE 114*  BUN 20  CREATININE 1.27*  CALCIUM 8.7*    ------------------------------------------------------------------------------------------------------------------  Cardiac Enzymes No results for input(s): TROPONINI in the last 168 hours. ------------------------------------------------------------------------------------------------------------------  RADIOLOGY:  No results found.    IMPRESSION AND PLAN:   Shortness of breath- may be due to undiagnosed CHF, given his elevated BNP, dyspnea on exertion, and orthopnea. -COVID test is pending -CXR with cardiomegaly and pulmonary vascular congestion. -Lasix 40mg  IV daily -Check ECHO and BNP -Cardiology consult -Hydralazine started by cards -Add low dose coreg -Strict I/O, daily weights -Cardiac monitoring  Atypical chest pain- may be related to above. Initial troponin 26.  -Trend troponins -Norberta Keens ordered for today -ECHO -Cardiology consult  Hypertension- BP mildly elevated in the ED -Home lisinopril has been discontinued -Start coreg and hydralazine  CKD II- creatinine is close to baseline -Avoid nephrotoxic agents -Monitor  Type 1 diabetes -Check A1c -Lantus and SSI  Hyperlipidemia -Check lipid panel -Increase home lipitor to 40mg  daily  Wife, [name], was at bedside and was updated on the plan.  All the records are reviewed and  case discussed with ED provider. Management plans discussed with the patient, family and they are in agreement.  CODE STATUS: Full  TOTAL TIME TAKING CARE OF THIS PATIENT: 45 minutes.    Jinny Blossom [name] M.D on 09/14/2019 at 7:15 AM  Between 7am to 6pm - Pager - [phone]  After 6pm go to [email or web address] - Social research officer, government  [facility]  Office  [phone]  CC: Primary care physician; Emi Belfast, FNP   Note: This dictation was prepared with Dragon dictation along with smaller phrase technology. Any transcriptional errors that result from this process are unintentional.

## 2019-09-14 NOTE — Consult Note (Signed)
Cardiology Consultation:   Patient ID: [name] MRN: [id number]; DOB: 05/27/1976  Admit date: 09/14/2019 Date of Consult: 09/14/2019  Primary Care Provider: Emi BelfastGessner, Deborah B, FNP Primary Cardiologist: Debbe OdeaBrian Agbor-Etang, MD  Primary Electrophysiologist:  None    Patient Profile:   Logan RowanRonald J Wilson is a 43 y.o. male with a hx of HTN, HLD, DM2, and who is being seen today for the evaluation of heart failure at the request of Dr. Nancy MarusMayo.  History of Present Illness:   Mr. [name] is a 43 yo male with PMH as above. He was recently evaluated in the ED last week on 09/07/19 for chest discomfort, at which time he reported a 3 week history of non-exertional chest pressure, rated 6/10 in severity. Associated sx included SOB when laying on his back or side. He also c/o progressive cough. He denied any family history of heart disease. In the ED, troponins were minimally elevated with BNP 700s. BP 141/115, HR 92, 100% ORA. CXR showed hazy opacities, concerning for edema. He was given Lasix 20mg  daily and followed up with cardiology 3 days later. In clinic on 10/22, plan was for Lexiscan and echo (outpatient and to occur in 1 month), and to increase his PTA lisinopril to 40mg  daily.   Today, 09/14/2019, he presents with "near constant" chest tightness that he described as "not chest pain, just SOB at rest" over the last 3-4 weeks. Over the last 1-2 days, and since starting his home Lasix, the patient feels that his sx have worsened. He describes the "chest tightness due to SOB" as "a weight on [his] chest." It is constant, non-positional, non-exertional, non-radiating and has been ongoing since 3-4 weeks ago. He continues to report a dry cough; however, he states this cough only occurs at night and usually wakes him from sleep. He denies any exposure to COVID-19 or recent fever. He has not had a sleep study in the past to rule out sleep apnea; however, his wife has not noticed that he stops breathing  at night. He reports he has difficulty laying flat; however, this is only for reasons of discomfort due to a fused L4/L5 and currently herniated L2/L3, associated with LLE sciatica. He reports diabetic neuropathy and pain in his feet but denies any LEE. He denies any sx consistent with orthopnea. He does report abdominal fullness, starting in the last 1-2 days. No report of constipation, nausea, emesis, BRBPR, hematuria, hemoptysis, hematochezia, racing HR, or palpitations. His last bowel movement was this morning. He has been compliant with his medications that were prescribed at his last office visit.  He reportedly decided to present to the ED again today, because his SOB again woke him from sleep; however, unlike in the past, he could not fall back to sleep. In the ED, CXR showed stable cardiomegaly and mild congestion and was thought relatively unchanged from previous. Labs significant for hypokalemia with K 3.7, AKI with Cr 1.27 and BUN 20, Hgb 13.8. BNP 1014.0. HS Tn 23  24  26. EKG without acute changes.   Heart Pathway Score:     Past Medical History:  Diagnosis Date  . Diabetes mellitus   . ED (erectile dysfunction)   . Hypertension     Past Surgical History:  Procedure Laterality Date  . ANKLE SURGERY     both   . back fusion     l4,l5  . BACK SURGERY    . LAPAROSCOPIC APPENDECTOMY N/A 03/28/2019   Procedure: APPENDECTOMY LAPAROSCOPIC;  Surgeon: Sung AmabileSakai, Isami,  DO;  Location: [facility] ORS;  Service: General;  Laterality: N/A;  . NASAL SINUS SURGERY       Home Medications:  Prior to Admission medications   Medication Sig Start Date End Date Taking? Authorizing Provider  atorvastatin (LIPITOR) 20 MG tablet Take 1 tablet by mouth once daily 08/12/19  Yes Elby Beck, FNP  DULoxetine (CYMBALTA) 20 MG capsule Take 2 capsules by mouth daily. 03/03/19  Yes [provider]  furosemide (LASIX) 20 MG tablet Take 1 tablet (20 mg total) by mouth daily for 15 days. 09/07/19 09/22/19 Yes  Arta Silence, MD  gabapentin (NEURONTIN) 600 MG tablet Take 1.5 tablets (900 mg total) by mouth 3 (three) times daily for 10 days. 04/02/19 09/14/19 Yes Sakai, Isami, DO  ibuprofen (ADVIL,MOTRIN) 200 MG tablet Take 200 mg by mouth every 6 (six) hours as needed.   Yes [provider]  lisinopril (ZESTRIL) 40 MG tablet Take 1 tablet (40 mg total) by mouth daily. 09/10/19  Yes Agbor-Etang, Aaron Edelman, MD  NOVOLIN 70/30 FLEXPEN (70-30) 100 UNIT/ML PEN Inject 25 Units into the skin 3 (three) times daily.  04/13/19  Yes [provider]  rOPINIRole (REQUIP) 0.25 MG tablet Take 1 tablet by mouth 3 (three) times daily. 06/08/19  Yes [provider]    Inpatient Medications: Scheduled Meds: . atorvastatin  40 mg Oral q1800  . carvedilol  6.25 mg Oral BID WC  . DULoxetine  40 mg Oral Daily  . enoxaparin (LOVENOX) injection  40 mg Subcutaneous Q24H  . [START ON 09/15/2019] furosemide  40 mg Intravenous Daily  . hydrALAZINE  25 mg Oral TID  . insulin aspart  0-5 Units Subcutaneous QHS  . insulin aspart  0-9 Units Subcutaneous TID WC  . insulin glargine  5 Units Subcutaneous QHS  . rOPINIRole  0.25 mg Oral TID  . sodium chloride flush  3 mL Intravenous Q12H   Continuous Infusions: . sodium chloride     PRN Meds: sodium chloride, acetaminophen, ondansetron (ZOFRAN) IV, sodium chloride flush  Allergies:    Allergies  Allergen Reactions  . Food     Chicken, eggs and peaches    Social History:   Social History   Socioeconomic History  . Marital status: Married    Spouse name: Not on file  . Number of children: Not on file  . Years of education: Not on file  . Highest education level: Not on file  Occupational History  . Not on file  Social Needs  . Financial resource strain: Not on file  . Food insecurity    Worry: Not on file    Inability: Not on file  . Transportation needs    Medical: Not on file    Non-medical: Not on file  Tobacco Use  . Smoking  status: Never Smoker  . Smokeless tobacco: Never Used  Substance and Sexual Activity  . Alcohol use: No  . Drug use: No  . Sexual activity: Yes    Birth control/protection: None  Lifestyle  . Physical activity    Days per week: Not on file    Minutes per session: Not on file  . Stress: Not on file  Relationships  . Social Herbalist on phone: Not on file    Gets together: Not on file    Attends religious service: Not on file    Active member of club or organization: Not on file    Attends meetings of clubs or organizations: Not on  file    Relationship status: Not on file  . Intimate partner violence    Fear of current or ex partner: Not on file    Emotionally abused: Not on file    Physically abused: Not on file    Forced sexual activity: Not on file  Other Topics Concern  . Not on file  Social History Narrative  . Not on file    Family History:    Family History  Problem Relation Age of Onset  . Hypertension Mother   . Diabetes Father      ROS:  Please see the history of present illness.  Review of Systems  Constitutional: Positive for malaise/fatigue.  Respiratory: Positive for cough and shortness of breath. Negative for hemoptysis, sputum production and wheezing.        Dry cough and SOB, progressive  Cardiovascular: Positive for chest pain and PND. Negative for palpitations, orthopnea, claudication and leg swelling.       Chest tightness associated with SOB. Patient reports not chest pain; however, his description is consistent with that of chest pain / pressure.  Gastrointestinal: Negative for abdominal pain, blood in stool, constipation, diarrhea, melena, nausea and vomiting.  Genitourinary: Negative for hematuria.  Musculoskeletal: Positive for back pain and myalgias.  Neurological: Positive for weakness.    All other ROS reviewed and negative.     Physical Exam/Data:   Vitals:   09/14/19 0615 09/14/19 0616 09/14/19 0630 09/14/19 0700  BP: (!)  145/111  (!) 149/116 (!) 178/137  Pulse: 96  97 94  Resp: 18  (!) 21 12  SpO2: 99%  97% 95%  Weight:  104.3 kg    Height:  6' 1.5" (1.867 m)     No intake or output data in the 24 hours ending 09/14/19 0907 Last 3 Weights 09/14/2019 09/10/2019 09/07/2019  Weight (lbs) 230 lb 234 lb 213 lb  Weight (kg) 104.327 kg 106.142 kg 96.616 kg     Body mass index is 29.93 kg/m.  General:  Well nourished, well developed, in no acute distres HEENT: normal Neck: no JVD Vascular: No carotid bruits; radial pulses 2+ bilaterally Cardiac:  normal S1, S2; RRR; no murmur Lungs:  clear to auscultation bilaterally, no wheezing, rhonchi or rales  Abd: soft, nontender, no hepatomegaly  Ext: no lower extremity edema Musculoskeletal:  No deformities, BUE and BLE strength normal and equal Skin: warm and dry  Neuro:  No focal abnormalities noted Psych:  Normal affect   EKG:  The EKG was personally reviewed and demonstrates: NSR, 97bpm, LVH, possible LAE, TWI in the lateral leads and inferior leads (unchanged from prior) Telemetry:  Telemetry was personally reviewed and demonstrates:  NSR  Relevant CV Studies: Pending echo, stress  Laboratory Data:  High Sensitivity Troponin:   Recent Labs  Lab 09/07/19 1225 09/07/19 1412 09/14/19 0627  TROPONINIHS 23* 24* 26*     Cardiac EnzymesNo results for input(s): TROPONINI in the last 168 hours. No results for input(s): TROPIPOC in the last 168 hours.  Chemistry Recent Labs  Lab 09/07/19 1225 09/14/19 0627  NA 141 143  K 3.8 3.7  CL 107 109  CO2 27 26  GLUCOSE 229* 114*  BUN 18 20  CREATININE 1.31* 1.27*  CALCIUM 8.8* 8.7*  GFRNONAA >60 >60  GFRAA >60 >60  ANIONGAP 7 8    No results for input(s): PROT, ALBUMIN, AST, ALT, ALKPHOS, BILITOT in the last 168 hours. Hematology Recent Labs  Lab 09/07/19 1225 09/14/19 0627  WBC  4.3 3.9*  RBC 4.72 4.85  HGB 13.2 13.8  HCT 40.1 41.0  MCV 85.0 84.5  MCH 28.0 28.5  MCHC 32.9 33.7  RDW 13.1  13.4  PLT 136* 169   BNP Recent Labs  Lab 09/07/19 1412 09/14/19 0627  BNP 765.0* 1,014.0*    DDimer No results for input(s): DDIMER in the last 168 hours.   Radiology/Studies:  Dg Chest Portable 1 View  Result Date: 09/14/2019 CLINICAL DATA:  Shortness of breath and fatigue 3 weeks. EXAM: PORTABLE CHEST 1 VIEW COMPARISON:  01/07/2019 FINDINGS: Lungs are somewhat hypoinflated with mild hazy prominence of the perihilar vessels suggesting mild vascular congestion. No definite effusion. Mild stable cardiomegaly. Remainder of the exam is unchanged. IMPRESSION: Mild stable cardiomegaly with suggestion of mild vascular congestion. Electronically Signed   By: Elberta Fortis M.D.   On: 09/14/2019 07:16    Assessment and Plan:   Shortness of Breath --Current SOB and atypical CP (described as pressure / weight on his chest and constant). Also reports abdominal fullness with exam notable for soft abdomen.  --Does not appear volume overloaded on exam and HS Tn / EKG not consistent with ACS. Pending stress test and echo, originally ordered as an outpatient and to rule out SOB as anginal equivalent. --Pending repeat and rapid COVID-19 test, as CXR appears unchanged and request from stress test lab to rule out prior to testing. Consider BNP could be elevated with COVID-19 as well. Consider also SOB due to elevated BP. Further recommendations pending COVID-19 result, stress test, and echo as below.   Atypical CP --Current. Described as tightness and pressure, constant. --HS Tn not consistent with ACS.  --EKG without acute changes --Pending stress and echo as above. --No plan for further ischemic workup with emergent cath unless echo shows decreased EF or WMA or HS Tn shows significant bump. --Continue medical management.  Hypokalemia --Repleting K with goal 4.0. --Check Mg with goal 2.0.  HTN --Improving. Started Coreg, hydralazine TID per MD --ACE held due to AKI.   HLD --Continue statin  therapy.  AKI / CKD --Cr elevated but improved from 10/19.  --Daily BMET. Continue to monitor. --Caution with diuresis, nephrotoxins. --Holding ACEi.  DM2 --SSI. --Per IM.  For questions or updates, please contact [facility] Please consult [email or web address] for contact info under     Signed, Lennon Alstrom, PA-C  09/14/2019 9:07 AM

## 2019-09-14 NOTE — ED Provider Notes (Signed)
[facility] Emergency Department Provider Note   ____________________________________________   First MD Initiated Contact with Patient 09/14/19 [phone]     (approximate)  I have reviewed the triage vital signs and the nursing notes.   HISTORY  Chief Complaint Shortness of Breath    HPI [name] is a 43 y.o. male with past medical history of hypertension and diabetes who presents to the ED complaining of shortness of breath.  Patient reports he has had near constant shortness of breath for the past 3 weeks.  He states he feels like he has trouble catching his breath even at rest which is associated with a feeling of tightness in his chest.  Over the past 24 to 48 hours, he feels like symptoms have worsened and he is now having a dry cough as well as more difficulty breathing.  He states that whenever he went to lay flat to try to sleep last night, he woke up gasping for air.  He denies any fevers and has not had any sick contacts, reports a negative Covid test on October 2.  He has noticed a small amount of swelling in both of his legs, but denies any calf pain.  He was recently seen by cardiology and started on Lasix, but states that symptoms have not improved.  He is scheduled for stress testing and echocardiogram in the next month.        Past Medical History:  Diagnosis Date  . Diabetes mellitus   . ED (erectile dysfunction)   . Hypertension     Patient Active Problem List   Diagnosis Date Noted  . Acute gangrenous appendicitis 03/30/2019  . Appendicitis 03/29/2019  . Chest pressure 02/10/2019  . Cough 02/10/2019  . Diabetic polyneuropathy associated with type 2 diabetes mellitus (HCC) 07/21/2018  . Hyperlipidemia associated with type 2 diabetes mellitus (HCC) 07/21/2018  . S/P arthroscopy of shoulder 06/01/2014  . Knee pain 06/29/2013  . Pain in joint, shoulder region 06/29/2013  . 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 Allergic rhinitis, cause unspecified 03/17/2013  . Unspecified essential hypertension 03/17/2013    Past Surgical History:  Procedure Laterality Date  . ANKLE SURGERY     both   . back fusion     l4,l5  . BACK SURGERY    . LAPAROSCOPIC APPENDECTOMY N/A 03/28/2019   Procedure: APPENDECTOMY LAPAROSCOPIC;  Surgeon: Sung Amabile, DO;  Location: [facility] ORS;  Service: General;  Laterality: N/A;  . NASAL SINUS SURGERY      Prior to Admission medications   Medication Sig Start Date End Date Taking? Authorizing Provider  atorvastatin (LIPITOR) 20 MG tablet Take 1 tablet by mouth once daily 08/12/19   Emi Belfast, FNP  Dextrose, Diabetic Use, (RELION GLUCOSE PO) Take by mouth. 3 times daily.    [provider]  DULoxetine (CYMBALTA) 20 MG capsule Take 2 capsules by mouth daily. 03/03/19   [provider]  furosemide (LASIX) 20 MG tablet Take 1 tablet (20 mg total) by mouth daily for 15 days. 09/07/19 09/22/19  Dionne Bucy, MD  gabapentin (NEURONTIN) 600 MG tablet Take 1.5 tablets (900 mg total) by mouth 3 (three) times daily for 10 days. 04/02/19 08/19/19  Tonna Boehringer, Isami, DO  ibuprofen (ADVIL,MOTRIN) 200 MG tablet Take 200 mg by mouth every 6 (six) hours as needed.    [provider]  INS SYRINGE/NEEDLE .5CC/27G 27G X 1/2" 0.5 ML MISC 1 Units by Does not apply  route as needed. 06/02/18   Elby Beck, FNP  lisinopril (ZESTRIL) 40 MG tablet Take 1 tablet (40 mg total) by mouth daily. 09/10/19   Kate Sable, MD  NOVOLIN 70/30 FLEXPEN (70-30) 100 UNIT/ML PEN INJECT 10 UNITS SUBCUTANEOUSLY TWICE DAILY BEFORE MEAL(S). MAX OF 60 UNITS DAILY 04/13/19   [provider]  rOPINIRole (REQUIP) 0.25 MG tablet Take 1 tablet by mouth 3 (three) times daily. 06/08/19   [provider]    Allergies Food  Family History  Problem Relation Age of Onset  . Hypertension Mother   . Diabetes Father     Social History Social History    Tobacco Use  . Smoking status: Never Smoker  . Smokeless tobacco: Never Used  Substance Use Topics  . Alcohol use: No  . Drug use: No    Review of Systems  Constitutional: No fever/chills Eyes: No visual changes. ENT: No sore throat. Cardiovascular: Positive for chest tightness. Respiratory: Positive for cough and shortness of breath. Gastrointestinal: No abdominal pain.  No nausea, no vomiting.  No diarrhea.  No constipation. Genitourinary: Negative for dysuria. Musculoskeletal: Negative for back pain. Skin: Negative for rash. Neurological: Negative for headaches, focal weakness or numbness.  ____________________________________________   PHYSICAL EXAM:  VITAL SIGNS: ED Triage Vitals  Enc Vitals Group     BP      Pulse      Resp      Temp      Temp src      SpO2      Weight      Height      Head Circumference      Peak Flow      Pain Score      Pain Loc      Pain Edu?      Excl. in [place]?     Constitutional: Alert and oriented. Eyes: Conjunctivae are normal. Head: Atraumatic. Nose: No congestion/rhinnorhea. Mouth/Throat: Mucous membranes are moist. Neck: Normal ROM Cardiovascular: Normal rate, regular rhythm. Grossly normal heart sounds. Respiratory: Normal respiratory effort.  No retractions.  Crackles at bilateral bases, no wheezing. Gastrointestinal: Soft and nontender. No distention. Genitourinary: deferred Musculoskeletal: Trace pitting edema to bilateral lower extremities without tenderness. Neurologic:  Normal speech and language. No gross focal neurologic deficits are appreciated. Skin:  Skin is warm, dry and intact. No rash noted. Psychiatric: Mood and affect are normal. Speech and behavior are normal.  ____________________________________________   LABS (all labs ordered are listed, but only abnormal results are displayed)  Labs Reviewed  BASIC METABOLIC PANEL - Abnormal; Notable for the following components:      Result Value   Glucose,  Bld 114 (*)    Creatinine, Ser 1.27 (*)    Calcium 8.7 (*)    All other components within normal limits  CBC WITH DIFFERENTIAL/PLATELET - Abnormal; Notable for the following components:   WBC 3.9 (*)    All other components within normal limits  TROPONIN I (HIGH SENSITIVITY) - Abnormal; Notable for the following components:   Troponin I (High Sensitivity) 26 (*)    All other components within normal limits  SARS CORONAVIRUS 2 (TAT 6-24 HRS)  BRAIN NATRIURETIC PEPTIDE   ____________________________________________  EKG  ED ECG REPORT I, Blake Divine, the attending physician, personally viewed and interpreted this ECG.   Date: 09/14/2019  EKG Time: 6:28  Rate: 97  Rhythm: normal sinus rhythm  Axis: Normal  Intervals:none  ST&T Change: Inferolateral TWI and poor R wave progression, similar  to prior   PROCEDURES  Procedure(s) performed (including Critical Care):  Procedures   ____________________________________________   INITIAL IMPRESSION / ASSESSMENT AND PLAN / ED COURSE       43 year old male with history of hypertension and diabetes presents to the ED complaining of worsening shortness of breath and chest tightness.  He was recently seen in the ED for the symptoms with presumed CHF diagnosis and had follow-up with cardiology, but symptoms were not improving following being started on Lasix and increasing blood pressure medications.  Patient states he has been compliant with Lasix and symptoms have worsened despite this.  He denies any fevers or cough productive of purulent sputum, lower suspicion for infectious etiology.  He did have CTA performed earlier this month that was negative for PE.  EKG shows LVH and T wave inversions similar to prior, will check chest x-ray, troponin, BNP.  Chest x-ray shows significant pulmonary edema consistent with CHF.  Will diurese with IV lasix and given patient is not improving with worsening pulmonary edema by chest x-ray, will admit  for further work-up of new onset CHF.  Case discussed with hospitalist, who accepts patient for admission.      ____________________________________________   FINAL CLINICAL IMPRESSION(S) / ED DIAGNOSES  Final diagnoses:  Acute congestive heart failure, unspecified heart failure type (HCC)  Orthopnea     ED Discharge Orders    None       Note:  This document was prepared using Dragon voice recognition software and may include unintentional dictation errors.   Chesley Noon, MD 09/14/19 [phone]

## 2019-09-14 NOTE — Progress Notes (Signed)
Complaining of midsternal chest pressure and feeling hot and sweaty.  Rates 4/10Skin warm dry. Reports "feels like water stuck in my throat also.  VS noted.  Nitroglycerin offered and taken with some relief after 1st tablet. 2nd tablet given and pain reduced 102/10.  Declines 3rd tablet and requests Maalox at this time.

## 2019-09-14 NOTE — ED Triage Notes (Signed)
Pt arrives to ED from home via POV with c/c of SoB and fatigue. Sx onset approx 3 weeks ago. Pt has been seen at this ED for same Sx 1 week ago. Pt referred for Echo and stress test and discharged. SoB increased this evening and he was unable to sleep. Pt denies NVD, fever/chills. Pt reports negative Covid test in last 2 weeks. Dr Charna Archer at bedside.

## 2019-09-15 ENCOUNTER — Inpatient Hospital Stay (HOSPITAL_COMMUNITY)
Admit: 2019-09-15 | Discharge: 2019-09-15 | Disposition: A | Discharge status: Home / Self Care | Payer: Medicare Other | Attending: Internal Medicine | Admitting: Internal Medicine

## 2019-09-15 DIAGNOSIS — I340 Nonrheumatic mitral (valve) insufficiency: Secondary | ICD-10-CM

## 2019-09-15 DIAGNOSIS — I5021 Acute systolic (congestive) heart failure: Secondary | ICD-10-CM | POA: Diagnosis not present

## 2019-09-15 LAB — GLUCOSE, CAPILLARY
Glucose-Capillary: 142 mg/dL — ABNORMAL HIGH (ref 70–99)
Glucose-Capillary: 186 mg/dL — ABNORMAL HIGH (ref 70–99)
Glucose-Capillary: 188 mg/dL — ABNORMAL HIGH (ref 70–99)
Glucose-Capillary: 211 mg/dL — ABNORMAL HIGH (ref 70–99)
Glucose-Capillary: 212 mg/dL — ABNORMAL HIGH (ref 70–99)

## 2019-09-15 LAB — BRAIN NATRIURETIC PEPTIDE: B Natriuretic Peptide: 1004 pg/mL — ABNORMAL HIGH (ref 0.0–100.0)

## 2019-09-15 LAB — BASIC METABOLIC PANEL
Anion gap: 8 (ref 5–15)
BUN: 21 mg/dL — ABNORMAL HIGH (ref 6–20)
CO2: 28 mmol/L (ref 22–32)
Calcium: 8.9 mg/dL (ref 8.9–10.3)
Chloride: 105 mmol/L (ref 98–111)
Creatinine, Ser: 1.43 mg/dL — ABNORMAL HIGH (ref 0.61–1.24)
GFR calc Af Amer: >60 mL/min (ref >60)
GFR calc non Af Amer: 60 mL/min — ABNORMAL LOW (ref >60)
Glucose, Bld: 241 mg/dL — ABNORMAL HIGH (ref 70–99)
Potassium: 3.6 mmol/L (ref 3.5–5.1)
Sodium: 141 mmol/L (ref 135–145)

## 2019-09-15 LAB — HEMOGLOBIN A1C
Hgb A1c MFr Bld: 7.5 % — ABNORMAL HIGH (ref 4.8–5.6)
Mean Plasma Glucose: 168.55 mg/dL

## 2019-09-15 LAB — CBC
HCT: 41.4 % (ref 39.0–52.0)
Hemoglobin: 13.9 g/dL (ref 13.0–17.0)
MCH: 28.1 pg (ref 26.0–34.0)
MCHC: 33.6 g/dL (ref 30.0–36.0)
MCV: 83.6 fL (ref 80.0–100.0)
Platelets: 166 10*3/uL (ref 150–400)
RBC: 4.95 MIL/uL (ref 4.22–5.81)
RDW: 13 % (ref 11.5–15.5)
WBC: 3.3 10*3/uL — ABNORMAL LOW (ref 4.0–10.5)
nRBC: 0 % (ref 0.0–0.2)

## 2019-09-15 LAB — FERRITIN: Ferritin: 130 ng/mL (ref 24–336)

## 2019-09-15 LAB — LIPID PANEL
Cholesterol: 118 mg/dL (ref 0–200)
HDL: 48 mg/dL (ref >40)
LDL Cholesterol: 60 mg/dL (ref 0–99)
Total CHOL/HDL Ratio: 2.5 RATIO
Triglycerides: 49 mg/dL (ref <150)
VLDL: 10 mg/dL (ref 0–40)

## 2019-09-15 LAB — TSH: TSH: 1.207 u[IU]/mL (ref 0.350–4.500)

## 2019-09-15 LAB — ECHOCARDIOGRAM COMPLETE
Height: 73.5 in
Weight: 3632 oz

## 2019-09-15 MED ORDER — LISINOPRIL 20 MG PO TABS: 40.0000 mg | ORAL_TABLET | Freq: Every day | ORAL | Status: DC

## 2019-09-15 MED ORDER — DICYCLOMINE HCL 10 MG/5ML PO SOLN
10.0000 mg | Freq: Once | ORAL | Status: AC
  Administered 2019-09-15: 10 mg via ORAL
  Filled 2019-09-15 (×2): qty 5

## 2019-09-15 MED ORDER — ALUM & MAG HYDROXIDE-SIMETH 200-200-20 MG/5ML PO SUSP
30.0000 mL | Freq: Once | ORAL | Status: AC
  Administered 2019-09-15: 30 mL via ORAL
  Filled 2019-09-15: qty 30

## 2019-09-15 MED ORDER — LIDOCAINE VISCOUS HCL 2 % MT SOLN
15.0000 mL | Freq: Once | OROMUCOSAL | Status: AC
  Administered 2019-09-15: 15 mL via ORAL
  Filled 2019-09-15: qty 15

## 2019-09-15 MED ORDER — LOSARTAN POTASSIUM 50 MG PO TABS
50.0000 mg | ORAL_TABLET | Freq: Every day | ORAL | Status: DC
  Administered 2019-09-15: 50 mg via ORAL
  Filled 2019-09-15: qty 1

## 2019-09-15 NOTE — Progress Notes (Addendum)
[facility] - [place] at [facility]   PATIENT NAME: [name]    MR#:  [id number]  DATE OF BIRTH:  Mar 31, 1976  SUBJECTIVE:   Patient states he is feeling a little bit better this morning.  He denies any chest pain or shortness of breath at rest.  He denies any lower extremity edema.  He states that the stress test went well yesterday.  REVIEW OF SYSTEMS:  Review of Systems  Constitutional: Negative for chills and fever.  HENT: Negative for congestion and sore throat.   Eyes: Negative for blurred vision and double vision.  Respiratory: Negative for cough and shortness of breath.   Cardiovascular: Negative for chest pain, palpitations and leg swelling.  Gastrointestinal: Negative for nausea and vomiting.  Genitourinary: Negative for dysuria and urgency.  Musculoskeletal: Negative for back pain and neck pain.  Neurological: Negative for dizziness and headaches.  Psychiatric/Behavioral: Negative for depression. The patient is not nervous/anxious.     DRUG ALLERGIES:   Allergies  Allergen Reactions  . Food     Chicken, eggs and peaches   VITALS:  Blood pressure (!) 146/117, pulse 93, temperature 97.6 F (36.4 C), temperature source Oral, resp. rate 19, height 6' 1.5" (1.867 m), weight 103 kg, SpO2 96 %. PHYSICAL EXAMINATION:  Physical Exam  GENERAL:  Laying in the bed with no acute distress.  HEENT: Head atraumatic, normocephalic. Pupils equal, round, reactive to light and accommodation. No scleral icterus. Extraocular muscles intact. Oropharynx and nasopharynx clear.  NECK:  Supple, no jugular venous distention. No thyroid enlargement. LUNGS: Lungs are clear to auscultation bilaterally. No wheezes, crackles, rhonchi. No use of accessory muscles of respiration.  CARDIOVASCULAR: RRR, S1, S2 normal. No murmurs, rubs, or gallops.  ABDOMEN: Soft, nontender, nondistended. Bowel sounds present.  EXTREMITIES: No pedal edema, cyanosis, or clubbing.  NEUROLOGIC:  CN 2-12 intact, no focal deficits. 5/5 muscle strength throughout all extremities. Sensation intact throughout. Gait not checked.  PSYCHIATRIC: The patient is alert and oriented x 3.  SKIN: No obvious rash, lesion, or ulcer.  LABORATORY PANEL:  Male CBC Recent Labs  Lab 09/15/19 0533  WBC 3.3*  HGB 13.9  HCT 41.4  PLT 166   ------------------------------------------------------------------------------------------------------------------ Chemistries  Recent Labs  Lab 09/15/19 0533  NA 141  K 3.6  CL 105  CO2 28  GLUCOSE 241*  BUN 21*  CREATININE 1.43*  CALCIUM 8.9   RADIOLOGY:  Nm Myocar Multi W/spect W/wall Motion / Ef  Result Date: 09/14/2019  T wave inversion was noted during stress in the II, III, aVF, V5 and V6 leads.  This is an intermediate risk study.  The left ventricular ejection fraction is severely decreased (<30%). Correlate with other modality such as Echocardiogram  There was a very small partially reversible apical defect. This is likely due to apical thining or artifact  No evidence of ischemia was otherwise noted on this study    ASSESSMENT AND PLAN:   Acute systolic CHF- improving. UOP 2.1L in the last 24 hours. -ECHO this admission with EF <20% and severely decreased left ventricular function -Continue Lasix 40mg  IV daily -Cardiology following -Continue Coreg and hydralazine -Losartan started by cardiology, may transition to [facility] -Strict I/O, daily weights -Cardiac monitoring  Atypical chest pain- may be related to above. Resolved. [facility] was intermediate risk -Cardiology consult  Hypertension- BP mildly elevated in the ED -Continue Coreg, losartan, and hydralazine  CKD II- creatinine is close to baseline -Avoid nephrotoxic agents -Monitor  Type 1  diabetes- A1c 7.5% this admission. -Lantus and SSI  Hyperlipidemia- LDL 60 this admission -Continue lipitor to 40mg  daily  Attempted to call wife, [name], via the  phone, but had to leave a voicemail.  All the records are reviewed and case discussed with Care Management/Social Worker. Management plans discussed with the patient, family and they are in agreement.  CODE STATUS: Full Code  TOTAL TIME TAKING CARE OF THIS PATIENT: 40 minutes.   More than 50% of the time was spent in counseling/coordination of care: YES  POSSIBLE D/C IN 1-2 DAYS, DEPENDING ON CLINICAL CONDITION.   Berna Spare Chantele Corado M.D on 09/15/2019 at 2:54 PM  Between 7am to 6pm - Pager [phone]  After 6pm go to [email or web address] - Proofreader  [facility] [place] [facility]  Office  [phone]  CC: Primary care physician; Elby Beck, FNP  Note: This dictation was prepared with Dragon dictation along with smaller phrase technology. Any transcriptional errors that result from this process are unintentional.

## 2019-09-15 NOTE — Plan of Care (Signed)

## 2019-09-15 NOTE — Progress Notes (Signed)
Informed by Gay Filler from [place] that patient had refused to have his room cleaned today.

## 2019-09-15 NOTE — Plan of Care (Signed)
Nutrition Education Note RD working remotely.  RD consulted for nutrition education regarding new onset CHF. RD spoke with patient via phone this afternoon. Patient endorses good appetite and intake at home, recalls 4-6 meals/day prepared by his wife. Patient stated they have recently made dietary changes and making efforts to decrease sodium intake. He reports no longer eating pork or adding meat when preparing vegetables as well as incorporating more fish and decreasing red meat consumption.   Patient amenable to receiving educational materials that were discussed via mail. RD will provide via mail "Low Sodium Nutrition Therapy" handout from the Academy of [employer] and [employer]etics. Reviewed patient's dietary recall. Provided examples on ways to decrease sodium intake in diet. Discouraged intake of processed foods and use of salt shaker. Encouraged fresh fruits and vegetables as well as whole grain sources of carbohydrates to maximize fiber intake.   RD discussed why it is important for patient to adhere to diet recommendations, and emphasized the role of fluids, foods to avoid, and importance of weighing self daily. Teach back method used.  Expect fair compliance.  Body mass index is 29.54 kg/m. Pt meets criteria for overweight based on current BMI.  Current diet order is HH/CM, patient is consuming approximately 100% of meals at this time. Labs and medications reviewed. No further nutrition interventions warranted at this time. RD contact information provided. If additional nutrition issues arise, please re-consult RD.   Lajuan Lines, [place], [place] Clinical Nutrition Office [phone] After Hours/Weekend Pager: [phone]

## 2019-09-15 NOTE — Progress Notes (Signed)
*  PRELIMINARY RESULTS* Echocardiogram 2D Echocardiogram has been performed.  [name] [name] 09/15/2019, 10:54 AM

## 2019-09-15 NOTE — Progress Notes (Signed)
Inpatient Diabetes Program Recommendations  AACE/ADA: New Consensus Statement on Inpatient Glycemic Control (2015)  Target Ranges:  Prepandial:   less than 140 mg/dL      Peak postprandial:   less than 180 mg/dL (1-2 hours)      Critically ill patients:  140 - 180 mg/dL   Results for JAVID, KEMLER (MRN [id number]) as of 09/15/2019 12:07  Ref. Range 09/14/2019 11:46 09/14/2019 13:42 09/14/2019 17:14 09/14/2019 21:09 09/15/2019 00:55  Glucose-Capillary Latest Ref Range: 70 - 99 mg/dL 129 (H) 99 153 (H)  2 units NOVOLOG  183 (H) 188 (H)    5 units LANTUS   Results for SHAHRAM, ALEXOPOULOS (MRN [id number]) as of 09/15/2019 12:07  Ref. Range 09/15/2019 07:28 09/15/2019 11:06  Glucose-Capillary Latest Ref Range: 70 - 99 mg/dL 211 (H)  3 units NOVOLOG  212 (H)  3 units NOVOLOG      Admit CP/ SOB  History: DM   Home DM Meds: 70/30 Insulin 34 units BID (see ENDO note from 10/15)    Current Orders: Lantus 5 units QHS      Novolog Sensitive Correction Scale/ SSI (0-9 units) TID AC + HS   ENDO: Malissa Hippo, NP [facility]--last seen 10/15--Told to take 70/30 Insulin 34 units BID (has parameters to take less based on CBG)       Novolog started yest at 6pm--Lantus started last night at 1am.   Current A1c= 7.4%.   CBGs >200 mg/dl today.   MD- Please consider the following in-hospital insulin adjustments:  1. Increase Lantus to 10 units QHS  2. Start Novolog Meal Coverage:  Novolog 4 units TID with meals  (Please add the following Hold Parameters: Hold if pt eats <50% of meal, Hold if pt NPO)  3. Can restart 70/30 Insulin at time of discharge home     --Will follow patient during hospitalization--  Wyn Quaker RN, MSN, CDE Diabetes Coordinator Inpatient Glycemic Control Team Team Pager: [phone] (8a-5p)

## 2019-09-15 NOTE — Progress Notes (Signed)
Progress Note  Patient Name: [name] Date of Encounter: 09/15/2019  Primary Cardiologist: Debbe Odea, MD   Subjective   Notes improved SOB though still present.  Woke up several times during the night feeling short of breath; however, he was able to fall back asleep though was given a muscle relaxer which he feels may have helped him stay asleep.  No further nausea.  Reports he feels his abdominal distention is resolved. No further atypical chest pain.  He now mentions a history of occasional racing heart rate with recommendations as below. No palpitations noted.   Inpatient Medications    Scheduled Meds: . atorvastatin  40 mg Oral q1800  . carvedilol  6.25 mg Oral BID WC  . DULoxetine  40 mg Oral Daily  . enoxaparin (LOVENOX) injection  40 mg Subcutaneous Q24H  . furosemide  40 mg Intravenous Q12H  . hydrALAZINE  25 mg Oral TID  . insulin aspart  0-5 Units Subcutaneous QHS  . insulin aspart  0-9 Units Subcutaneous TID WC  . insulin glargine  5 Units Subcutaneous QHS  . rOPINIRole  0.25 mg Oral TID  . sodium chloride flush  3 mL Intravenous Q12H   Continuous Infusions: . sodium chloride     PRN Meds: sodium chloride, acetaminophen, alum & mag hydroxide-simeth, nitroGLYCERIN, ondansetron (ZOFRAN) IV, sodium chloride flush   Vital Signs    Vitals:   09/14/19 2108 09/15/19 0025 09/15/19 0530 09/15/19 0721  BP: (!) 126/98 (!) 150/108 (!) 152/123 (!) 146/117  Pulse: 100 97 98 93  Resp: 19  19 19   Temp: 98.4 F (36.9 C)  98.4 F (36.9 C) 97.6 F (36.4 C)  TempSrc: Oral  Oral Oral  SpO2: 100% 99% 95% 96%  Weight:   103 kg   Height:        Intake/Output Summary (Last 24 hours) at 09/15/2019 1344 Last data filed at 09/15/2019 1012 Gross per 24 hour  Intake 243 ml  Output 1600 ml  Net -1357 ml   Last 3 Weights 09/15/2019 09/14/2019 09/10/2019  Weight (lbs) 227 lb 230 lb 234 lb  Weight (kg) 102.967 kg 104.327 kg 106.142 kg      Telemetry    NSR  to ST with rates 80s-100s - Personally Reviewed  ECG    No new tracings- Personally Reviewed  Physical Exam   GEN: No acute distress.   Neck: No JVD Cardiac: RRR, no murmurs, rubs, or gallops.  Respiratory: Clear to auscultation bilaterally. GI: Soft, nontender, no further abdominal distention  MS: No edema; No deformity. Neuro:  Nonfocal  Psych: Normal affect   Labs    High Sensitivity Troponin:   Recent Labs  Lab 09/07/19 1225 09/07/19 1412 09/14/19 0627 09/14/19 1033  TROPONINIHS 23* 24* 26* 22*      Cardiac EnzymesNo results for input(s): TROPONINI in the last 168 hours. No results for input(s): TROPIPOC in the last 168 hours.   Chemistry Recent Labs  Lab 09/14/19 0627 09/15/19 0533  NA 143 141  K 3.7 3.6  CL 109 105  CO2 26 28  GLUCOSE 114* 241*  BUN 20 21*  CREATININE 1.27* 1.43*  CALCIUM 8.7* 8.9  GFRNONAA >60 60*  GFRAA >60 >60  ANIONGAP 8 8     Hematology Recent Labs  Lab 09/14/19 0627 09/15/19 0533  WBC 3.9* 3.3*  RBC 4.85 4.95  HGB 13.8 13.9  HCT 41.0 41.4  MCV 84.5 83.6  MCH 28.5 28.1  MCHC 33.7 33.6  RDW 13.4 13.0  PLT 169 166    BNP Recent Labs  Lab 09/14/19 0627  BNP 1,014.0*     DDimer No results for input(s): DDIMER in the last 168 hours.   Radiology    Nm Myocar Multi W/spect W/wall Motion / Ef  Result Date: 09/14/2019  T wave inversion was noted during stress in the II, III, aVF, V5 and V6 leads.  This is an intermediate risk study.  The left ventricular ejection fraction is severely decreased (<30%). Correlate with other modality such as Echocardiogram  There was a very small partially reversible apical defect. This is likely due to apical thining or artifact  No evidence of ischemia was otherwise noted on this study    Dg Chest Portable 1 View  Result Date: 09/14/2019 CLINICAL DATA:  Shortness of breath and fatigue 3 weeks. EXAM: PORTABLE CHEST 1 VIEW COMPARISON:  01/07/2019 FINDINGS: Lungs are somewhat  hypoinflated with mild hazy prominence of the perihilar vessels suggesting mild vascular congestion. No definite effusion. Mild stable cardiomegaly. Remainder of the exam is unchanged. IMPRESSION: Mild stable cardiomegaly with suggestion of mild vascular congestion. Electronically Signed   By: Marin Olp M.D.   On: 09/14/2019 07:16    Cardiac Studies   Pending echo  09/14/19 NM Myoview   T wave inversion was noted during stress in the II, III, aVF, V5 and V6 leads.  This is an intermediate risk study.  The left ventricular ejection fraction is severely decreased (<30%). Correlate with other modality such as Echocardiogram  There was a very small partially reversible apical defect. This is likely due to apical thining or artifact  No evidence of ischemia was otherwise noted on this study   Patient Profile     43 y.o. male with history of HFrEF, HTN, HLD, and DM2, and who is being evaluated for worsening SOB at the request of Dr. Brett Albino.  Assessment & Plan    HFrEF (EF <30% on stress 08/2019 with echo pending) Shortness of Breath --Current SOB, though improved from admission. Woke several times during the night due to SOB. Continued cough. Denies further abdominal fullness or chest pressure. COVID-19 negative. --HS Tn peaked at 26/ EKG not consistent with ACS. Stress test without evidence of ischemia though TWI on EKG with reduced EF <30%. Pending echo to confirm reduced EF and further assessment for any structural changes or WMA. Consider reduced EF due to NICM with h/o poorly controlled BP. --Continue to monitor I/Os, daily standing weights. -2 L over the last 24 hours. 104.3kg  103kg. Will trend BNP with 10/26 BNP of 1,014.0. Volume status / abdominal distention significantly improved on exam. --Continue to escalate heart failure therapy as tolerated. Continue Coreg and hydralazine TID for BP control. Restarted PTA Lisinopril 40mg  daily, given BP remains elevated and it appears  baseline Cr is around 1.2-1.4. If EF remains reduced on echo, consider addition of spironolactone +/- transition from ACE to ARB with ultimate plan for [facility] as an outpatient as renal function allows. Will need to transition to an oral diuretic before discharge. Given abdominal distention, could consider torsemide in the future (PTA lasix 20mg ). --Consider outpatient sleep study. Given report of racing HR in the past, also could consider Zio in the future.   Atypical CP --No further atypical CP. Described earlier as tightness and pressure, constant. --HS Tn not consistent with ACS. EKG without acute changes but did note TWI as above during stress test with stress test ruled intermediate risk. No plan  for emergent catheterization. Pending echo to confirm EF and reassess for any structural changes or WMA. Further recommendations pending echo. Consider reduced EF due to NICM with h/o poorly controlled BP. No indication for heparin at this time. --Continue medical management with Coreg, lasix, hydralazine, ACE for now with further recommendations as directly above. Aggressive risk factor modification recommended with statin, last LDL 60 and below goal. Recommend optimization of antihypertensive regimen and escalation of guideline directed therapy as tolerated.  Hypokalemia --Repleting K with goal 4.0.  --Check Mg with goal 2.0.  HTN --Improving. Consider NICM with echo pending to confirm EF. Continue Coreg, hydralazine TID. Restarted ACE. Consider transition to ARB with ultimate goal to transition to [facility] if echo confirms EF <30%. Consider also spironolactone as above. Home medications include ibuprofen and would advise patient avoid any heavy NSAID use in the future.  HLD --Continue statin therapy. LDL at goal <70.  CKD --Daily BMET. Continue to monitor. --Recommend future BP and glucose control to prevent worsening renal function.  DM2 --SSI. --Per IM.  For questions or updates,  please contact [facility] Please consult [email or web address] for contact info under        Signed, Lennon Alstrom, PA-C  09/15/2019, 12:56 PM

## 2019-09-16 DIAGNOSIS — I5021 Acute systolic (congestive) heart failure: Secondary | ICD-10-CM | POA: Diagnosis not present

## 2019-09-16 DIAGNOSIS — I509 Heart failure, unspecified: Secondary | ICD-10-CM

## 2019-09-16 LAB — BASIC METABOLIC PANEL
Anion gap: 7 (ref 5–15)
BUN: 22 mg/dL — ABNORMAL HIGH (ref 6–20)
CO2: 30 mmol/L (ref 22–32)
Calcium: 8.9 mg/dL (ref 8.9–10.3)
Chloride: 104 mmol/L (ref 98–111)
Creatinine, Ser: 1.68 mg/dL — ABNORMAL HIGH (ref 0.61–1.24)
GFR calc Af Amer: 57 mL/min — ABNORMAL LOW (ref >60)
GFR calc non Af Amer: 49 mL/min — ABNORMAL LOW (ref >60)
Glucose, Bld: 146 mg/dL — ABNORMAL HIGH (ref 70–99)
Potassium: 3.3 mmol/L — ABNORMAL LOW (ref 3.5–5.1)
Sodium: 141 mmol/L (ref 135–145)

## 2019-09-16 LAB — GLUCOSE, CAPILLARY
Glucose-Capillary: 159 mg/dL — ABNORMAL HIGH (ref 70–99)
Glucose-Capillary: 197 mg/dL — ABNORMAL HIGH (ref 70–99)
Glucose-Capillary: 182 mg/dL — ABNORMAL HIGH (ref 70–99)
Glucose-Capillary: 146 mg/dL — ABNORMAL HIGH (ref 70–99)

## 2019-09-16 MED ORDER — CARVEDILOL 6.25 MG PO TABS: 6.2500 mg | ORAL_TABLET | Freq: Two times a day (BID) | ORAL | 0 refills | Status: DC

## 2019-09-16 MED ORDER — ATORVASTATIN CALCIUM 40 MG PO TABS: 40.0000 mg | ORAL_TABLET | Freq: Every day | ORAL | 0 refills | Status: DC

## 2019-09-16 MED ORDER — ISOSORBIDE MONONITRATE ER 30 MG PO TB24
30.0000 mg | ORAL_TABLET | Freq: Every day | ORAL | Status: DC
  Administered 2019-09-16 – 2019-09-19 (×4): 30 mg via ORAL
  Filled 2019-09-16 (×4): qty 1

## 2019-09-16 MED ORDER — ISOSORBIDE MONONITRATE ER 30 MG PO TB24: 30.0000 mg | ORAL_TABLET | Freq: Every day | ORAL | 0 refills | Status: DC

## 2019-09-16 MED ORDER — POTASSIUM CHLORIDE CRYS ER 20 MEQ PO TBCR
40.0000 meq | EXTENDED_RELEASE_TABLET | Freq: Once | ORAL | Status: AC
  Administered 2019-09-16: 40 meq via ORAL
  Filled 2019-09-16: qty 2

## 2019-09-16 MED ORDER — HYDRALAZINE HCL 50 MG PO TABS
50.0000 mg | ORAL_TABLET | Freq: Three times a day (TID) | ORAL | Status: DC
  Administered 2019-09-16 – 2019-09-17 (×4): 50 mg via ORAL
  Filled 2019-09-16 (×4): qty 1

## 2019-09-16 MED ORDER — HYDRALAZINE HCL 50 MG PO TABS: 50.0000 mg | ORAL_TABLET | Freq: Three times a day (TID) | ORAL | 0 refills | Status: DC

## 2019-09-16 NOTE — Progress Notes (Signed)
[employer] - [place] at [facility]   PATIENT NAME: Tavi Hoogendoorn    MR#:  [id number]  DATE OF BIRTH:  August 12, 1976  SUBJECTIVE:   Patient states his shortness of breath has greatly improved since admission.  He states he was able to walk all over the hospital hallways yesterday and did not become short of breath.  He denies any chest pain or lower extremity edema.  REVIEW OF SYSTEMS:  Review of Systems  Constitutional: Negative for chills and fever.  HENT: Negative for congestion and sore throat.   Eyes: Negative for blurred vision and double vision.  Respiratory: Negative for cough and shortness of breath.   Cardiovascular: Negative for chest pain, palpitations and leg swelling.  Gastrointestinal: Negative for nausea and vomiting.  Genitourinary: Negative for dysuria and urgency.  Musculoskeletal: Negative for back pain and neck pain.  Neurological: Negative for dizziness and headaches.  Psychiatric/Behavioral: Negative for depression. The patient is not nervous/anxious.     DRUG ALLERGIES:   Allergies  Allergen Reactions  . Food     Chicken, eggs and peaches   VITALS:  Blood pressure (!) 148/118, pulse 87, temperature 98.2 F (36.8 C), temperature source Oral, resp. rate 20, height 6' 1.5" (1.867 m), weight 106 kg, SpO2 98 %. PHYSICAL EXAMINATION:  Physical Exam  GENERAL:  Laying in the bed with no acute distress.  Well-appearing. HEENT: Head atraumatic, normocephalic. Pupils equal, round, reactive to light and accommodation. No scleral icterus. Extraocular muscles intact. Oropharynx and nasopharynx clear.  NECK:  Supple, no jugular venous distention. No thyroid enlargement. LUNGS: Lungs are clear to auscultation bilaterally. No wheezes, crackles, rhonchi. No use of accessory muscles of respiration.  CARDIOVASCULAR: RRR, S1, S2 normal. No murmurs, rubs, or gallops.  ABDOMEN: Soft, nontender, nondistended. Bowel sounds present.  EXTREMITIES: No pedal  edema, cyanosis, or clubbing.  NEUROLOGIC: CN 2-12 intact, no focal deficits. 5/5 muscle strength throughout all extremities. Sensation intact throughout. Gait not checked.  PSYCHIATRIC: The patient is alert and oriented x 3.  SKIN: No obvious rash, lesion, or ulcer.  LABORATORY PANEL:  Male CBC Recent Labs  Lab 09/15/19 0533  WBC 3.3*  HGB 13.9  HCT 41.4  PLT 166   ------------------------------------------------------------------------------------------------------------------ Chemistries  Recent Labs  Lab 09/16/19 0422  NA 141  K 3.3*  CL 104  CO2 30  GLUCOSE 146*  BUN 22*  CREATININE 1.68*  CALCIUM 8.9   RADIOLOGY:  No results found. ASSESSMENT AND PLAN:   Acute systolic CHF- improving. UOP 1.2L in the last 24 hours. -ECHO this admission with EF <20% and severely decreased left ventricular function -Holding Lasix and losartan for today due to bump in creatinine -Cardiology following -Continue Coreg and hydralazine -Strict I/O, daily weights -Cardiac monitoring  Atypical chest pain- may be related to above. Resolved. Norberta Keens was intermediate risk -Cardiology consult  Hypertension- BP has been overall well controlled. -Continue Coreg and hydralazine -Holding home losartan due to bump in creatinine.  CKD II- creatinine has increased from yesterday to today, likely due to starting losartan yesterday. -Avoid nephrotoxic agents -Monitor  Type 1 diabetes- A1c 7.5% this admission. -Lantus and SSI  Hyperlipidemia- LDL 60 this admission -Continue lipitor to 40mg  daily  Wife, , was at bedside and was updated on the plan.  Likely discharge home tomorrow.  All the records are reviewed and case discussed with Care Management/Social Worker. Management plans discussed with the patient, family and they are in agreement.  CODE STATUS: Full Code  TOTAL TIME TAKING CARE OF THIS PATIENT: 38 minutes.   More than 50% of the time was spent in  counseling/coordination of care: YES  POSSIBLE D/C IN 1-2 DAYS, DEPENDING ON CLINICAL CONDITION.   Berna Spare Alya Smaltz M.D on 09/16/2019 at 12:57 PM  Between 7am to 6pm - Pager - [phone]  After 6pm go to [email or web address] - Proofreader  [facility] [place] [facility]  Office  [phone]  CC: Primary care physician; Elby Beck, FNP  Note: This dictation was prepared with Dragon dictation along with smaller phrase technology. Any transcriptional errors that result from this process are unintentional.

## 2019-09-16 NOTE — TOC Initial Note (Signed)
Transition of Care [facility]) - Initial/Assessment Note    Patient Details  Name: [name] MRN: [id number] Date of Birth: 1976/11/02  Transition of Care [facility]) CM/SW Contact:    Candie Chroman, LCSW Phone Number: 09/16/2019, 1:10 PM  Clinical Narrative: CSW met with patient. No supports at bedside. CSW introduced role and explained that discharge planning would be discussed. Patient is agreeable to [facility] for heart failure protocol and follow up. Provided CMS scores for agencies that serve his zip code. Patient has no preference. [facility] will see if they have available staffing. Patient stated he was told he will discharge on Entresto. Will provide 30-day free card at discharge. Patient confirmed he has a scale at home and has been educated on daily weights. No further concerns. CSW encouraged patient to contact CSW as needed. CSW will continue to follow patient for support and facilitate return home when stable.   Expected Discharge Plan: [place] Barriers to Discharge: Continued Medical Work up   Patient Goals and CMS Choice   CMS Medicare.gov Compare Post Acute Care list provided to:: Patient    Expected Discharge Plan and Services Expected Discharge Plan: [place] Choice: [place] arrangements for the past 2 months: Single Family Home Expected Discharge Date: 09/16/19                                    Prior Living Arrangements/Services Living arrangements for the past 2 months: Single Family Home Lives with:: Spouse Patient language and need for interpreter reviewed:: Yes Do you feel safe going back to the place where you live?: Yes      Need for Family Participation in Patient Care: Yes (Comment) Care giver support system in place?: Yes (comment)   Criminal Activity/Legal Involvement Pertinent to Current Situation/Hospitalization: No - Comment as needed  Activities of Daily Living Home  Assistive Devices/Equipment: None ADL Screening (condition at time of admission) Patient's cognitive ability adequate to safely complete daily activities?: No Is the patient deaf or have difficulty hearing?: No Does the patient have difficulty seeing, even when wearing glasses/contacts?: No Does the patient have difficulty concentrating, remembering, or making decisions?: No Patient able to express need for assistance with ADLs?: Yes Does the patient have difficulty dressing or bathing?: No Independently performs ADLs?: Yes (appropriate for developmental age) Does the patient have difficulty walking or climbing stairs?: No Weakness of Legs: None Weakness of Arms/Hands: None  Permission Sought/Granted Permission sought to share information with : Facility Art therapist granted to share information with : Yes, Verbal Permission Granted     Permission granted to share info w AGENCY: Home Health Agencies        Emotional Assessment Appearance:: Appears stated age Attitude/Demeanor/Rapport: Engaged, Gracious Affect (typically observed): Accepting, Appropriate, Calm, Pleasant Orientation: : Oriented to Self, Oriented to Place, Oriented to  Time, Oriented to Situation Alcohol / Substance Use: Never Used Psych Involvement: No (comment)  Admission diagnosis:  Orthopnea [R06.01] Acute congestive heart failure, unspecified heart failure type [facility]) [I50.9] Patient Active Problem List   Diagnosis Date Noted  . Acute systolic heart failure ([place])   . Acute CHF ([place]) 09/14/2019  . Acute gangrenous appendicitis 03/30/2019  . Appendicitis 03/29/2019  . Chest pressure 02/10/2019  . Cough 02/10/2019  . Diabetic polyneuropathy associated with type 2 diabetes mellitus ([place]) 07/21/2018  .  Hyperlipidemia associated with type 2 diabetes mellitus ([place]) 07/21/2018  . S/P arthroscopy of shoulder 06/01/2014  . Knee pain 06/29/2013  . Pain in joint, shoulder region 06/29/2013  .  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 Allergic rhinitis, cause unspecified 03/17/2013  . Unspecified essential hypertension 03/17/2013   PCP:  Elby Beck, FNP Pharmacy:   [facility] [address] [place] [address] [place] [address] Phone: [phone] Fax: [place], [place] [address] [place] [address] Phone: [phone] Fax: [phone]     Social Determinants of Health (SDOH) Interventions    Readmission Risk Interventions No flowsheet data found.

## 2019-09-16 NOTE — Progress Notes (Signed)
Cardiovascular and Pulmonary Nurse Navigator Note:    43 year old male with known hx of HTN and  DM who presented to the ED with COB for the last 3 weeks.  Patient's BNP on admission 1014 09/14/2019 was 1014 and on 09/15/2019 1004.  CXR on admission with mild stable cardiomyopathy and mild vascular congestion.  HS Troponin 24 / 26 / 22.  Hgb A1C 7.5.     ECHOCARDIOGRAM REPORT Patient Name: [name] Date of Exam: 09/15/2019 Medical Rec #: [id number] Height: 73.5 in Accession #: [id number] Weight: 227.0 lb Date of Birth: [birth year] BSA: 2.28 m Patient Age: 43 years BP: 146/117 mmHg Patient Gender: M HR: 92 bpm. Exam Location: [facility] Procedure: 2D Echo, Color Doppler and Cardiac Doppler Indications: I50.31 CHF-Acute Diastolic History: Patient has no prior history of Echocardiogram examinations. Sonographer: Charmayne Sheer RDCS (AE) Referring Phys: [id number] KATY DODD MAYO  1. Left ventricular ejection fraction, by visual estimation, is <20%. The left ventricle has severely decreased function. Mildly increased left ventricular size. There is mildly increased left ventricular hypertrophy. 2. Elevated mean left atrial pressure. 3. Left ventricular diastolic Doppler parameters are consistent with restrictive filling pattern of LV diastolic filling. 4. Global right ventricle has moderately reduced systolic function.The right ventricular size is normal. No increase in right ventricular wall thickness. 5. Left atrial size was moderately dilated. 6. Right atrial size was mildly dilated. 7. The mitral valve is normal in structure. Mild mitral valve regurgitation. 8. The tricuspid valve is not well visualized. Tricuspid valve regurgitation is trivial. 9. The aortic valve is tricuspid Aortic valve regurgitation was not visualized by color flow Doppler. Structurally normal aortic valve, with no evidence of sclerosis or stenosis. 10. The pulmonic valve was grossly normal. Pulmonic valve regurgitation  is trivial by color flow Doppler. 11. TR signal is inadequate for assessing pulmonary artery systolic pressure. 12. The inferior vena cava is normal in size with <50% respiratory variability, suggesting right atrial pressure of 8 mmHg. 13. The interatrial septum was not well visualized.    ________________________________  Status Exam Begun  Exam Ended   Final [99] 09/14/2019 1:38 PM 09/14/2019 3:24 PM  PACS Intelerad Image Link  Show images for NM Myocar Multi W/Spect W/Wall Motion / EF  Study Result   T wave inversion was noted during stress in the II, III, aVF, V5 and V6 leads.  This is an intermediate risk study.  The left ventricular ejection fraction is severely decreased (<30%). Correlate with other modality such as Echocardiogram  There was a very small partially reversible apical defect. This is likely due to apical thining or artifact  No evidence of ischemia was otherwise noted on this study   EDUCATION / CARDIAC REHAB:  This RN met with patient.  Introduced role and explained that CHF and Cardiac Rehab would be discussed.  Briefly reviewed definition of heart failure and signs and symptoms of an exacerbation.?Explained to patient that HF is a chronic illness which requires self-assessment / self-management along with help from the cardiologist/PCP.?Discussed EF value, his EF and normal EF.  Patient's EF on echo this admission was < 20%.     *Reviewed importance of and reason behind checking weight daily in the AM, after using the bathroom, but before getting dressed. Patient has functioning scale and plans to start weighing himself daily.  *Reviewed with patient the following information:  *Discussed when to call the Dr= weight gain of >2-3lb overnight of 5lb in a week,  *Discussed yellow zone= call  MD: weight gain of >2-3lb overnight of 5lb in a week, increased swelling, increased SOB when lying down, chest discomfort, dizziness, increased fatigue  *Red Zone= call  911: struggle to breath, fainting or near fainting, significant chest pain  *Patient has Heart Failure Zone magnet   *Dietitian consultation for diet education was completed on 09/15/2019.    *Discussed fluid intake with patient as well. Patient not currently on a fluid restriction, but advised no more than 64 ounces of fluid per day.?  *Instructed patient to take medications as prescribed for heart failure. Explained briefly why pt is on the medications (either make you feel better, live longer or keep you out of the hospital) and discussed monitoring and side effects.  *Discussed exercise. Patient informed this RN that this summer and up until 2 months ago he was playing basketball with his 3 sons.  Patient described just becoming more and more SOB.  Explained to patient he has been referred to outpatient Cardiac Rehab at [facility] with dx of CHF.  Explained per Medicare guidelines one has to be out of the hospital and stable on HF medications / treatment for 6 weeks before starting the onsite program.  Overview of Cardiac Rehab provided.  Patient provided with informational letter with CPT billing codes and brochure.  Patient is interested in participating in Cardiac Rehab.  This RN then explained to patient about the virtual Cardiac Rehab program, which is offered during the waiting period and is completely free - there will be no charges for this program.  Patient gave verbal consent to participate in Virtual Cardiac Rehab program.    Confirm Consent - "In the setting of the current Covid19 crisis, you are scheduled to join our "At Home" [facility]  Cardiac or Pulmonary  Rehab program . Just as we do with many in-gym visits, in order for you to participate in this program, we must obtain consent.  If you'd like, I can send this to your mychart (if signed up) or email for you to review.  Otherwise, I can obtain your verbal consent now.  By agreeing to a Cardiac or Pulmonary Rehab Telehealth visit, we'd like you  to understand that the technology does not allow for your Cardiac or Pulmonary Rehab team member to perform a physical assessment, and thus may limit their ability to fully assess your ability to perform exercise programs. If your provider identifies any concerns that need to be evaluated in person, we will make arrangements to do so.  Finally, though the technology is pretty good, we cannot assure that it will always work on either your or our end and we cannot ensure that we have a secure connection.  Cardiac and Pulmonary Rehab Telehealth visits and "At Home" cardiac and pulmonary rehab are provided at no cost to you. Are you willing to proceed?"   Staff:  Did the patient verbally acknowledge consent to telehealth visit? YES  Best Number to Call:  Patient has spotty cell service / reception. First cell number to use: [phone]. Second cell number to use: [phone].    *Smoking Cessation- Patient is a NEVER smoker.   ? *[facility] Heart Failure Clinic - Explained the purpose of the HF Clinic. ?Explained to patient the HF Clinic does not replace PCP nor Cardiologist, but is an additional resource to helping patient manage heart failure at home. Patient's HF Clinic appointment is scheduled for 09/24/2019 at 10 a.m.    Patient thanked me for providing the above information. ?  Roanna Epley,  RN, BSN, [place] [facility]  Cardiovascular & Pulmonary Nurse Navigator  Direct Line: [phone]  Department Phone #: [phone] Fax: [phone]  Email Address: [email or web address]    ?

## 2019-09-16 NOTE — Progress Notes (Signed)
Progress Note  Patient Name: [name] Date of Encounter: 09/16/2019  Primary Cardiologist: Kate Sable, MD   Subjective   Patient states doing okay, walked around yesterday with less shortness of breath.  Inpatient Medications    Scheduled Meds: . atorvastatin  40 mg Oral q1800  . carvedilol  6.25 mg Oral BID WC  . DULoxetine  40 mg Oral Daily  . enoxaparin (LOVENOX) injection  40 mg Subcutaneous Q24H  . hydrALAZINE  50 mg Oral TID  . insulin aspart  0-5 Units Subcutaneous QHS  . insulin aspart  0-9 Units Subcutaneous TID WC  . insulin glargine  5 Units Subcutaneous QHS  . isosorbide mononitrate  30 mg Oral Daily  . rOPINIRole  0.25 mg Oral TID  . sodium chloride flush  3 mL Intravenous Q12H   Continuous Infusions: . sodium chloride     PRN Meds: sodium chloride, acetaminophen, alum & mag hydroxide-simeth, nitroGLYCERIN, ondansetron (ZOFRAN) IV, sodium chloride flush   Vital Signs    Vitals:   09/15/19 1515 09/15/19 1947 09/16/19 0601 09/16/19 0755  BP: (!) 134/105 (!) 125/97 (!) 131/96 (!) 148/118  Pulse: 92 88 87 87  Resp: 19 20 20 20   Temp: 98.5 F (36.9 C) 98.2 F (36.8 C) 97.9 F (36.6 C) 98.2 F (36.8 C)  TempSrc: Oral Oral Oral Oral  SpO2: 96% 98% 96% 98%  Weight:   106 kg   Height:        Intake/Output Summary (Last 24 hours) at 09/16/2019 1148 Last data filed at 09/16/2019 1138 Gross per 24 hour  Intake -  Output 2200 ml  Net -2200 ml   Last 3 Weights 09/16/2019 09/15/2019 09/14/2019  Weight (lbs) 233 lb 11.2 oz 227 lb 230 lb  Weight (kg) 106.006 kg 102.967 kg 104.327 kg      Telemetry    Sinus rhythm- Personally Reviewed   Physical Exam   GEN: No acute distress.   Neck: No JVD Cardiac: RRR, no murmurs, rubs, or gallops.  Respiratory: Clear to auscultation bilaterally. GI: Soft, nontender, non-distended  MS: No edema; No deformity. Neuro:  Nonfocal  Psych: Normal affect   Labs    High Sensitivity Troponin:    Recent Labs  Lab 09/07/19 1225 09/07/19 1412 09/14/19 0627 09/14/19 1033  TROPONINIHS 23* 24* 26* 22*      Chemistry Recent Labs  Lab 09/14/19 0627 09/15/19 0533 09/16/19 0422  NA 143 141 141  K 3.7 3.6 3.3*  CL 109 105 104  CO2 26 28 30   GLUCOSE 114* 241* 146*  BUN 20 21* 22*  CREATININE 1.27* 1.43* 1.68*  CALCIUM 8.7* 8.9 8.9  GFRNONAA >60 60* 49*  GFRAA >60 >60 57*  ANIONGAP 8 8 7      Hematology Recent Labs  Lab 09/14/19 0627 09/15/19 0533  WBC 3.9* 3.3*  RBC 4.85 4.95  HGB 13.8 13.9  HCT 41.0 41.4  MCV 84.5 83.6  MCH 28.5 28.1  MCHC 33.7 33.6  RDW 13.4 13.0  PLT 169 166    BNP Recent Labs  Lab 09/14/19 0627 09/15/19 1348  BNP 1,014.0* 1,004.0*     DDimer No results for input(s): DDIMER in the last 168 hours.   Radiology    Nm Myocar Multi W/spect W/wall Motion / Ef  Result Date: 09/14/2019  T wave inversion was noted during stress in the II, III, aVF, V5 and V6 leads.  This is an intermediate risk study.  The left ventricular ejection fraction is severely  decreased (<30%). Correlate with other modality such as Echocardiogram  There was a very small partially reversible apical defect. This is likely due to apical thining or artifact  No evidence of ischemia was otherwise noted on this study     Cardiac Studies   Echo 09/15/2019 1. Left ventricular ejection fraction, by visual estimation, is <20%. The left ventricle has severely decreased function. Mildly increased left ventricular size. There is mildly increased left ventricular hypertrophy.  2. Elevated mean left atrial pressure.  3. Left ventricular diastolic Doppler parameters are consistent with restrictive filling pattern of LV diastolic filling.  4. Global right ventricle has moderately reduced systolic function.The right ventricular size is normal. No increase in right ventricular wall thickness.  5. Left atrial size was moderately dilated.  6. Right atrial size was mildly dilated.   7. The mitral valve is normal in structure. Mild mitral valve regurgitation.  8. The tricuspid valve is not well visualized. Tricuspid valve regurgitation is trivial.  9. The aortic valve is tricuspid Aortic valve regurgitation was not visualized by color flow Doppler. Structurally normal aortic valve, with no evidence of sclerosis or stenosis. 10. The pulmonic valve was grossly normal. Pulmonic valve regurgitation is trivial by color flow Doppler. 11. TR signal is inadequate for assessing pulmonary artery systolic pressure. 12. The inferior vena cava is normal in size with <50% respiratory variability, suggesting right atrial pressure of 8 mmHg. 13. The interatrial septum was not well visualized.  Patient Profile     43 y.o. male history of hypertension, diabetes, hyperlipidemia nonischemic cardiomyopathy who presents due to shortness of breath.  Work-up with a Lexiscan myocardial perfusion imaging did not reveal any evidence for ischemia.  Echocardiogram did reveal severely reduced left ventricular function and moderately reduced right ventricular function.  Assessment & Plan    Patient overall feels better and less short of breath.  He was able to walk yesterday without symptoms.  His renal function has gradually worsened over the past 3 days, with current creatinine at 1.6.  He is net -1 L over the past 24 hours.  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 Holding losartan in light of worsening renal function. Holding Lasix today due to creatinine getting worse.  Monitor creatinine tomorrow  HTN -Increasing hydralazine to 50 mg 3 times daily.  Imdur 30 mg daily added. -Continue Coreg 6.25 twice daily  HLD -Continue Lipitor 40 mg daily  AKI -Holding nephrotoxic medications including losartan, Lasix. Monitor creatinine tomorrow    Signed, Debbe Odea, MD  09/16/2019, 11:48 AM

## 2019-09-16 NOTE — Plan of Care (Signed)
  Problem: Education: Goal: Knowledge of General Education information will improve Description: Including pain rating scale, medication(s)/side effects and non-pharmacologic comfort measures Outcome: Progressing   Problem: Health Behavior/Discharge Planning: Goal: Ability to manage health-related needs will improve Outcome: Progressing   Problem: Clinical Measurements: Goal: Ability to maintain clinical measurements within normal limits will improve Outcome: Progressing Goal: Will remain free from infection Outcome: Progressing Goal: Cardiovascular complication will be avoided Outcome: Progressing   Problem: Education: Goal: Ability to demonstrate management of disease process will improve Outcome: Progressing Goal: Ability to verbalize understanding of medication therapies will improve Outcome: Progressing   Problem: Clinical Measurements: Goal: Respiratory complications will improve Outcome: Not Progressing

## 2019-09-16 NOTE — TOC Benefit Eligibility Note (Signed)
Transition of Care [facility]) Benefit Eligibility Note    Patient Details  Name: [name] MRN: [id number] Date of Birth: [birth year]   Medication/Dose: Delene Loll 24/26mg  tablets  Covered?: Yes  Prescription Coverage Preferred Pharmacy: Diannia Ruder with Person/Company/Phone Number:: Lennette Bihari with [employer] at [phone]  Co-Pay: $8.95 estimated copay for 30 day supply retail or mail order  Prior Approval: No  Deductible: (No deductible.  Patient has LIS on plan.)    Dannette Barbara Phone Number: [phone] 09/16/2019, 11:55 AM

## 2019-09-17 DIAGNOSIS — I110 Hypertensive heart disease with heart failure: Secondary | ICD-10-CM

## 2019-09-17 DIAGNOSIS — I5021 Acute systolic (congestive) heart failure: Secondary | ICD-10-CM | POA: Diagnosis not present

## 2019-09-17 DIAGNOSIS — I428 Other cardiomyopathies: Secondary | ICD-10-CM

## 2019-09-17 LAB — BASIC METABOLIC PANEL
Anion gap: 8 (ref 5–15)
BUN: 22 mg/dL — ABNORMAL HIGH (ref 6–20)
CO2: 27 mmol/L (ref 22–32)
Calcium: 9 mg/dL (ref 8.9–10.3)
Chloride: 105 mmol/L (ref 98–111)
Creatinine, Ser: 1.32 mg/dL — ABNORMAL HIGH (ref 0.61–1.24)
GFR calc Af Amer: >60 mL/min (ref >60)
GFR calc non Af Amer: >60 mL/min (ref >60)
Glucose, Bld: 174 mg/dL — ABNORMAL HIGH (ref 70–99)
Potassium: 3.5 mmol/L (ref 3.5–5.1)
Sodium: 140 mmol/L (ref 135–145)

## 2019-09-17 LAB — GLUCOSE, CAPILLARY
Glucose-Capillary: 140 mg/dL — ABNORMAL HIGH (ref 70–99)
Glucose-Capillary: 213 mg/dL — ABNORMAL HIGH (ref 70–99)
Glucose-Capillary: 176 mg/dL — ABNORMAL HIGH (ref 70–99)
Glucose-Capillary: 216 mg/dL — ABNORMAL HIGH (ref 70–99)

## 2019-09-17 MED ORDER — SACUBITRIL-VALSARTAN 24-26 MG PO TABS: 1.0000 | ORAL_TABLET | Freq: Two times a day (BID) | ORAL | Status: DC

## 2019-09-17 MED ORDER — POTASSIUM CHLORIDE CRYS ER 10 MEQ PO TBCR
10.0000 meq | EXTENDED_RELEASE_TABLET | Freq: Two times a day (BID) | ORAL | Status: DC
  Administered 2019-09-18 – 2019-09-19 (×3): 10 meq via ORAL
  Filled 2019-09-17 (×3): qty 1

## 2019-09-17 MED ORDER — POTASSIUM CHLORIDE CRYS ER 20 MEQ PO TBCR
40.0000 meq | EXTENDED_RELEASE_TABLET | Freq: Once | ORAL | Status: AC
  Administered 2019-09-17: 40 meq via ORAL
  Filled 2019-09-17: qty 2

## 2019-09-17 MED ORDER — POTASSIUM CHLORIDE CRYS ER 20 MEQ PO TBCR: 20.0000 meq | EXTENDED_RELEASE_TABLET | Freq: Two times a day (BID) | ORAL | 0 refills | Status: DC

## 2019-09-17 MED ORDER — SACUBITRIL-VALSARTAN 49-51 MG PO TABS: 1.0000 | ORAL_TABLET | Freq: Two times a day (BID) | ORAL | 0 refills | Status: DC

## 2019-09-17 MED ORDER — CARVEDILOL 6.25 MG PO TABS
6.2500 mg | ORAL_TABLET | Freq: Two times a day (BID) | ORAL | Status: DC
  Administered 2019-09-17 – 2019-09-19 (×4): 6.25 mg via ORAL
  Filled 2019-09-17 (×4): qty 1

## 2019-09-17 MED ORDER — SACUBITRIL-VALSARTAN 49-51 MG PO TABS
1.0000 | ORAL_TABLET | Freq: Two times a day (BID) | ORAL | Status: DC
  Administered 2019-09-17: 1 via ORAL
  Filled 2019-09-17 (×2): qty 1

## 2019-09-17 MED ORDER — FUROSEMIDE 10 MG/ML IJ SOLN
40.0000 mg | Freq: Once | INTRAMUSCULAR | Status: AC
  Administered 2019-09-17: 40 mg via INTRAVENOUS
  Filled 2019-09-17: qty 4

## 2019-09-17 MED ORDER — FUROSEMIDE 10 MG/ML IJ SOLN
40.0000 mg | Freq: Two times a day (BID) | INTRAMUSCULAR | Status: DC
  Administered 2019-09-17 – 2019-09-18 (×2): 40 mg via INTRAVENOUS
  Filled 2019-09-17 (×2): qty 4

## 2019-09-17 MED ORDER — CARVEDILOL 12.5 MG PO TABS: 12.5000 mg | ORAL_TABLET | Freq: Two times a day (BID) | ORAL | Status: DC

## 2019-09-17 MED ORDER — FUROSEMIDE 10 MG/ML IJ SOLN
40.0000 mg | Freq: Two times a day (BID) | INTRAMUSCULAR | Status: DC
  Administered 2019-09-17: 40 mg via INTRAVENOUS
  Filled 2019-09-17: qty 4

## 2019-09-17 MED ORDER — SACUBITRIL-VALSARTAN 24-26 MG PO TABS
1.0000 | ORAL_TABLET | Freq: Once | ORAL | Status: AC
  Administered 2019-09-17: 1 via ORAL
  Filled 2019-09-17: qty 1

## 2019-09-17 MED ORDER — TORSEMIDE 20 MG PO TABS: 40.0000 mg | ORAL_TABLET | Freq: Every day | ORAL | 0 refills | Status: DC

## 2019-09-17 NOTE — Care Management Important Message (Signed)
Important Message  Patient Details  Name: [name] MRN: [id number] Date of Birth: 24-Dec-1975   Medicare Important Message Given:  Yes     Juliann Pulse A Chiara Coltrin 09/17/2019, 10:26 AM

## 2019-09-17 NOTE — Progress Notes (Addendum)
Progress Note  Patient Name: [name] Date of Encounter: 09/17/2019  Primary Cardiologist: Kate Sable, MD   Subjective   Patient reports that he is not feeling well today, as he is significantly short of breath this morning.  He denies any abdominal distention or lower extremity edema.  He denies any chest pain or feeling of racing heart rate.  Inpatient Medications    Scheduled Meds: . atorvastatin  40 mg Oral q1800  . carvedilol  6.25 mg Oral BID WC  . DULoxetine  40 mg Oral Daily  . enoxaparin (LOVENOX) injection  40 mg Subcutaneous Q24H  . hydrALAZINE  50 mg Oral TID  . insulin aspart  0-5 Units Subcutaneous QHS  . insulin aspart  0-9 Units Subcutaneous TID WC  . insulin glargine  5 Units Subcutaneous QHS  . isosorbide mononitrate  30 mg Oral Daily  . rOPINIRole  0.25 mg Oral TID  . sodium chloride flush  3 mL Intravenous Q12H   Continuous Infusions: . sodium chloride     PRN Meds: sodium chloride, acetaminophen, alum & mag hydroxide-simeth, nitroGLYCERIN, ondansetron (ZOFRAN) IV, sodium chloride flush   Vital Signs    Vitals:   09/16/19 2022 09/17/19 0208 09/17/19 0314 09/17/19 0831  BP: (!) 125/93  (!) 141/106 123/88  Pulse: 88  88 91  Resp:    18  Temp: 98.1 F (36.7 C)  98 F (36.7 C) 98.2 F (36.8 C)  TempSrc: Oral  Oral Oral  SpO2: 98%  100% 96%  Weight:  101.5 kg    Height:        Intake/Output Summary (Last 24 hours) at 09/17/2019 0911 Last data filed at 09/17/2019 0010 Gross per 24 hour  Intake -  Output 1875 ml  Net -1875 ml   Last 3 Weights 09/17/2019 09/16/2019 09/15/2019  Weight (lbs) 223 lb 12.8 oz 233 lb 11.2 oz 227 lb  Weight (kg) 101.515 kg 106.006 kg 102.967 kg      Telemetry    Normal Sinus rhythm, 80s to 90s- Personally Reviewed  ECG    No new tracings- Personally Reviewed  Physical Exam   GEN: No acute distress.   Neck: +JVD Cardiac:  Tachycardic but regular, no murmurs, rubs, or gallops.   Respiratory:  Bibasilar crackles. GI: Soft, nontender, non-distended  MS: No significant bilateral edema; No deformity. Neuro:  Nonfocal  Psych: Normal affect   Labs    High Sensitivity Troponin:   Recent Labs  Lab 09/07/19 1225 09/07/19 1412 09/14/19 0627 09/14/19 1033  TROPONINIHS 23* 24* 26* 22*      Cardiac EnzymesNo results for input(s): TROPONINI in the last 168 hours. No results for input(s): TROPIPOC in the last 168 hours.   Chemistry Recent Labs  Lab 09/15/19 0533 09/16/19 0422 09/17/19 0418  NA 141 141 140  K 3.6 3.3* 3.5  CL 105 104 105  CO2 28 30 27   GLUCOSE 241* 146* 174*  BUN 21* 22* 22*  CREATININE 1.43* 1.68* 1.32*  CALCIUM 8.9 8.9 9.0  GFRNONAA 60* 49* >60  GFRAA >60 57* >60  ANIONGAP 8 7 8      Hematology Recent Labs  Lab 09/14/19 0627 09/15/19 0533  WBC 3.9* 3.3*  RBC 4.85 4.95  HGB 13.8 13.9  HCT 41.0 41.4  MCV 84.5 83.6  MCH 28.5 28.1  MCHC 33.7 33.6  RDW 13.4 13.0  PLT 169 166    BNP Recent Labs  Lab 09/14/19 0627 09/15/19 1348  BNP 1,014.0* 1,004.0*  DDimer No results for input(s): DDIMER in the last 168 hours.   Radiology    No results found.  Cardiac Studies   Echo 09/15/2019 1. Left ventricular ejection fraction, by visual estimation, is <20%. The left ventricle has severely decreased function. Mildly increased left ventricular size. There is mildly increased left ventricular hypertrophy. 2. Elevated mean left atrial pressure. 3. Left ventricular diastolic Doppler parameters are consistent with restrictive filling pattern of LV diastolic filling. 4. Global right ventricle has moderately reduced systolic function.The right ventricular size is normal. No increase in right ventricular wall thickness. 5. Left atrial size was moderately dilated. 6. Right atrial size was mildly dilated. 7. The mitral valve is normal in structure. Mild mitral valve regurgitation. 8. The tricuspid valve is not well visualized.  Tricuspid valve regurgitation is trivial. 9. The aortic valve is tricuspid Aortic valve regurgitation was not visualized by color flow Doppler. Structurally normal aortic valve, with no evidence of sclerosis or stenosis. 10. The pulmonic valve was grossly normal. Pulmonic valve regurgitation is trivial by color flow Doppler. 11. TR signal is inadequate for assessing pulmonary artery systolic pressure. 12. The inferior vena cava is normal in size with <50% respiratory variability, suggesting right atrial pressure of 8 mmHg. 13. The interatrial septum was not well visualized.   09/14/19 NM Myoview   T wave inversion was noted during stress in the II, III, aVF, V5 and V6 leads.  This is an intermediate risk study.  The left ventricular ejection fraction is severely decreased (<30%). Correlate with other modality such as Echocardiogram  There was a very small partially reversible apical defect. This is likely due to apical thining or artifact  No evidence of ischemia was otherwise noted on this study  Patient Profile     43 y.o. male with history of HFrEF (EF <20%), HTN, HLD, DM2, and who is being seen today for the evaluation of acute on chronic heart failure   Assessment & Plan    Acute HFrEF (EF <20%), NICM --Current SOB after Lasix was held yesterday. Given his low EF, preference is to keep on the drier side to prevent flash pulmonary edema. --Volume overloaded on exam.  --RN order for REDS vest.  --He is -5 L over the admission.  -1.875 L in the last 24 hours. Wt 104.3kg  101.5kg. --IV Lasix 40mg  x2 with first dose to be administered right now given worsening SOB and crackles on exam. Titration of IV Lasix to be determined s/p REDS vest reading.   --Consider discharging home on torsemide (was previously on PTA lasix 20mg ), given h/o abdominal distention and current volume status.  If discharged home on Lasix, recommend escalating home dose to 40 mg.   --Continue Coreg for BP  control.  Given tachycardic rates this morning, consider escalation of Coreg. --Discontinued hydralazine, in order to allow for escalation of guideline directed heart failure therapy.   --Started low-dose Entresto 24-26 today. Will need outpatient f/u BMET in 1 week to check renal function and potassium.  --Recommend addition of spironolactone in the future and pending follow-up assessment of renal function, especially given his low EF and history of hypokalemia as below, ----Consider outpatient sleep study. Given report of racing HR in the past, also could consider Zio in the future or at discharge.  --Close outpatient follow-up with clinic appointment scheduled for within the next 2 weeks, as well as a follow-up BMET in 1 week to reassess his renal function.     Atypical CP --No further  atypical CP. Described earlier as tightness and pressure, constant. --HS Tn not consistent with ACS. EKG without acute changes but did note TWI as above during his stress test, ultimately with stress test ruled intermediate risk.  --Echo shows EF <20%. Likely reduced EF due to NICM with h/o poorly controlled BP. No indication for heparin at this time. --Close outpatient follow-up as above. Recommend escalation of guideline directed therapy as tolerated. --Continue at discharge: Entresto, Coreg, increased dose lasix or torsemide (to be determined by REDS vest reading). Recommend addition of spironolactone now or as an outpatient, given his hypokalemia below and per guideline directed therapy. Aggressive risk factor modification recommended with continued statin. Last LDL 60 and below goal.   Hypokalemia --K 3.5. Repleting K with goal 4.0.  --Recommend spironolactone in the future as above. --Check Mg with goal 2.0.  HTN --Controlled. At discharge, continue Entresto, Coreg. Continue Imdur for now and as tolerated by BP. Discontinue hydralazine as above.  Recommend addition of spironolactone in the future as  above and given his hypokalemia. Home medications include ibuprofen and advise against any heavy NSAID use in the future.  HLD --Continue statin therapy. LDL at goal <70.  CKD --Daily BMET. Continue to monitor. --Recommend future BP and glucose control to prevent worsening renal function.  DM2 --SSI. --Per IM.  For questions or updates, please contact [facility] Please consult [email or web address] for contact info under        Signed, Lennon Alstrom, PA-C  09/17/2019, 9:11 AM

## 2019-09-17 NOTE — Discharge Summary (Signed)
[facility] [place] [facility]   PATIENT NAME: Zaeem Winstanley    MR#:  [id number]  DATE OF BIRTH:  Feb 26, 1976  DATE OF ADMISSION:  09/14/2019   ADMITTING PHYSICIAN: Campbell Stall, MD  DATE OF DISCHARGE: 09/17/19  PRIMARY CARE PHYSICIAN: Emi Belfast, FNP   ADMISSION DIAGNOSIS:  Orthopnea [R06.01] Acute congestive heart failure, unspecified heart failure type (HCC) [I50.9] DISCHARGE DIAGNOSIS:  Active Problems:   Acute CHF (HCC)   Acute systolic heart failure (HCC)  SECONDARY DIAGNOSIS:   Past Medical History:  Diagnosis Date  . Diabetes mellitus   . ED (erectile dysfunction)   . Hypertension    HOSPITAL COURSE:   Poe is a 43 year old male who presented to the ED with pain and shortness of breath.  In the ED, chest x-ray showed some mild vascular congestion.  He was admitted for further management.  Acute systolic CHF- improving. UOP 1.87L in the last 24 hours. -ECHO this admission with EF <20% and severely decreased left ventricular function -Treated with IV Lasix and then transitioned to torsemide 40 mg daily on discharge -Discharged on Coreg 6.25 mg twice daily -Started on Entresto this admission -Patient will follow up with cardiologist and heart failure clinic in the next 1-2 weeks -Home health heart failure protocol was ordered on discharge  Atypical chest pain- may be related to above. Resolved. Norberta Keens was intermediate risk -Patient will follow-up with cardiology as an outpatient -Consider outpatient cath  Hypertension- BP has been overall well-controlled. -Discharged on Coreg and Entresto  CKD II- creatinine bumped this admission, but returned back to baseline on the day of discharge -Needs BMP rechecked as an outpatient  Type1diabetes- A1c 7.5% this admission. -Home diabetes medicines were continued  Hyperlipidemia- LDL 60 this admission -Lipitor dose was increased to 40 mg daily  DISCHARGE  CONDITIONS:  Acute systolic congestive heart failure Hypertension CKD II Type 1 diabetes Hyperlipidemia CONSULTS OBTAINED:  Treatment Team:  Debbe Odea, MD DRUG ALLERGIES:   Allergies  Allergen Reactions  . Food     Chicken, eggs and peaches   DISCHARGE MEDICATIONS:   Allergies as of 09/17/2019      Reactions   Food    Chicken, eggs and peaches      Medication List    STOP taking these medications   furosemide 20 MG tablet Commonly known as: Lasix   gabapentin 600 MG tablet Commonly known as: NEURONTIN   ibuprofen 200 MG tablet Commonly known as: ADVIL   lisinopril 40 MG tablet Commonly known as: ZESTRIL     TAKE these medications   atorvastatin 40 MG tablet Commonly known as: LIPITOR Take 1 tablet (40 mg total) by mouth daily at 6 PM. What changed:   medication strength  how much to take  when to take this   carvedilol 6.25 MG tablet Commonly known as: COREG Take 1 tablet (6.25 mg total) by mouth 2 (two) times daily with a meal.   DULoxetine 20 MG capsule Commonly known as: CYMBALTA Take 2 capsules by mouth daily.   NovoLIN 70/30 FlexPen (70-30) 100 UNIT/ML PEN Generic drug: Insulin Isophane & Regular Human Inject 25 Units into the skin 3 (three) times daily.   potassium chloride SA 20 MEQ tablet Commonly known as: KLOR-CON Take 1 tablet (20 mEq total) by mouth 2 (two) times daily.   rOPINIRole 0.25 MG tablet Commonly known as: REQUIP Take 1 tablet by mouth 3 (three) times daily.   sacubitril-valsartan 49-51 MG Commonly  known as: ENTRESTO Take 1 tablet by mouth 2 (two) times daily.   torsemide 20 MG tablet Commonly known as: DEMADEX Take 2 tablets (40 mg total) by mouth daily.        DISCHARGE INSTRUCTIONS:  1.  Follow-up with PCP in 5 days 2.  Follow-up in heart failure clinic in 1-2 weeks 3.  Follow-up with cardiology in 2 weeks 4.  Take Coreg and Entresto as prescribed 5.  Lipitor dose was increased to 40 mg daily  6.  Take torsemide 40 mg daily and K-Dur 20 mEq daily DIET:  Cardiac diet and Diabetic diet DISCHARGE CONDITION:  Stable ACTIVITY:  Activity as tolerated OXYGEN:  Home Oxygen: No.  Oxygen Delivery: room air DISCHARGE LOCATION:  home   If you experience worsening of your admission symptoms, develop shortness of breath, life threatening emergency, suicidal or homicidal thoughts you must seek medical attention immediately by calling 911 or calling your MD immediately  if symptoms less severe.  You Must read complete instructions/literature along with all the possible adverse reactions/side effects for all the Medicines you take and that have been prescribed to you. Take any new Medicines after you have completely understood and accpet all the possible adverse reactions/side effects.   Please note  You were cared for by a hospitalist during your hospital stay. If you have any questions about your discharge medications or the care you received while you were in the hospital after you are discharged, you can call the unit and asked to speak with the hospitalist on call if the hospitalist that took care of you is not available. Once you are discharged, your primary care physician will handle any further medical issues. Please note that NO REFILLS for any discharge medications will be authorized once you are discharged, as it is imperative that you return to your primary care physician (or establish a relationship with a primary care physician if you do not have one) for your aftercare needs so that they can reassess your need for medications and monitor your lab values.    On the day of Discharge:  VITAL SIGNS:  Blood pressure 123/88, pulse 91, temperature 98.2 F (36.8 C), temperature source Oral, resp. rate 18, height 6' 1.5" (1.867 m), weight 101.5 kg, SpO2 96 %. PHYSICAL EXAMINATION:  GENERAL:  43 y.o.-year-old patient lying in the bed with no acute distress.  EYES: Pupils equal, round,  reactive to light and accommodation. No scleral icterus. Extraocular muscles intact.  HEENT: Head atraumatic, normocephalic. Oropharynx and nasopharynx clear.  NECK:  Supple, no jugular venous distention. No thyroid enlargement, no tenderness.  LUNGS: +faint bibasilar crackles present. No use of accessory muscles of respiration.  CARDIOVASCULAR: RRR, S1, S2 normal. No murmurs, rubs, or gallops.  ABDOMEN: Soft, non-tender, non-distended. Bowel sounds present. No organomegaly or mass.  EXTREMITIES: No pedal edema, cyanosis, or clubbing.  NEUROLOGIC: Cranial nerves II through XII are intact. Muscle strength 5/5 in all extremities. Sensation intact. Gait not checked.  PSYCHIATRIC: The patient is alert and oriented x 3.  SKIN: No obvious rash, lesion, or ulcer.  DATA REVIEW:   CBC Recent Labs  Lab 09/15/19 0533  WBC 3.3*  HGB 13.9  HCT 41.4  PLT 166    Chemistries  Recent Labs  Lab 09/17/19 0418  NA 140  K 3.5  CL 105  CO2 27  GLUCOSE 174*  BUN 22*  CREATININE 1.32*  CALCIUM 9.0     Microbiology Results  Results for orders placed or performed  during the hospital encounter of 09/14/19  SARS CORONAVIRUS 2 (TAT 6-24 HRS) Nasopharyngeal Nasopharyngeal Swab     Status: None   Collection Time: 09/14/19  7:25 AM   Specimen: Nasopharyngeal Swab  Result Value Ref Range Status   SARS Coronavirus 2 NEGATIVE NEGATIVE Final    Comment: (NOTE) SARS-CoV-2 target nucleic acids are NOT DETECTED. The SARS-CoV-2 RNA is generally detectable in upper and lower respiratory specimens during the acute phase of infection. Negative results do not preclude SARS-CoV-2 infection, do not rule out co-infections with other pathogens, and should not be used as the sole basis for treatment or other patient management decisions. Negative results must be combined with clinical observations, patient history, and epidemiological information. The expected result is Negative. Fact Sheet for Patients:  HairSlick.no Fact Sheet for Healthcare Providers: quierodirigir.com This test is not yet approved or cleared by the [place] FDA and  has been authorized for detection and/or diagnosis of SARS-CoV-2 by FDA under an Emergency Use Authorization (EUA). This EUA will remain  in effect (meaning this test can be used) for the duration of the COVID-19 declaration under Section 56 4(b)(1) of the Act, 21 U.S.C. section 360bbb-3(b)(1), unless the authorization is terminated or revoked sooner. Performed at [facility], [address], [place], [place] [address]   SARS Coronavirus 2 by RT PCR (hospital order, performed in [facility] hospital lab) Nasopharyngeal Nasopharyngeal Swab     Status: None   Collection Time: 09/14/19 12:22 PM   Specimen: Nasopharyngeal Swab  Result Value Ref Range Status   SARS Coronavirus 2 NEGATIVE NEGATIVE Final    Comment: (NOTE) If result is NEGATIVE SARS-CoV-2 target nucleic acids are NOT DETECTED. The SARS-CoV-2 RNA is generally detectable in upper and lower  respiratory specimens during the acute phase of infection. The lowest  concentration of SARS-CoV-2 viral copies this assay can detect is 250  copies / mL. A negative result does not preclude SARS-CoV-2 infection  and should not be used as the sole basis for treatment or other  patient management decisions.  A negative result may occur with  improper specimen collection / handling, submission of specimen other  than nasopharyngeal swab, presence of viral mutation(s) within the  areas targeted by this assay, and inadequate number of viral copies  (<250 copies / mL). A negative result must be combined with clinical  observations, patient history, and epidemiological information. If result is POSITIVE SARS-CoV-2 target nucleic acids are DETECTED. The SARS-CoV-2 RNA is generally detectable in upper and lower  respiratory specimens dur ing the  acute phase of infection.  Positive  results are indicative of active infection with SARS-CoV-2.  Clinical  correlation with patient history and other diagnostic information is  necessary to determine patient infection status.  Positive results do  not rule out bacterial infection or co-infection with other viruses. If result is PRESUMPTIVE POSTIVE SARS-CoV-2 nucleic acids MAY BE PRESENT.   A presumptive positive result was obtained on the submitted specimen  and confirmed on repeat testing.  While 2019 novel coronavirus  (SARS-CoV-2) nucleic acids may be present in the submitted sample  additional confirmatory testing may be necessary for epidemiological  and / or clinical management purposes  to differentiate between  SARS-CoV-2 and other Sarbecovirus currently known to infect humans.  If clinically indicated additional testing with an alternate test  methodology [phone]) is advised. The SARS-CoV-2 RNA is generally  detectable in upper and lower respiratory sp ecimens during the acute  phase of infection.  The expected result is Negative. Fact Sheet for Patients:  BoilerBrush.com.cyhttps://www.fda.gov/media/136312/download Fact Sheet for Healthcare Providers: https://pope.com/https://www.fda.gov/media/136313/download This test is not yet approved or cleared by the [place] FDA and has been authorized for detection and/or diagnosis of SARS-CoV-2 by FDA under an Emergency Use Authorization (EUA).  This EUA will remain in effect (meaning this test can be used) for the duration of the COVID-19 declaration under Section 564(b)(1) of the Act, 21 U.S.C. section 360bbb-3(b)(1), unless the authorization is terminated or revoked sooner. Performed at [facility], [address] [place], [place] [address]     RADIOLOGY:  No results found.   Management plans discussed with the patient, family and they are in agreement.  CODE STATUS: Full Code   TOTAL TIME TAKING CARE OF THIS PATIENT: 40 minutes.     Jinny BlossomKaty D  M.D on 09/17/2019 at 4:13 PM  Between 7am to 6pm - Pager [phone]  After 6pm go to [email or web address] - Social research officer, governmentpassword EPAS [facility]  [facility] [place]  Office  [phone]  CC: Primary care physician; Emi BelfastGessner, Deborah B, FNP   Note: This dictation was prepared with Dragon dictation along with smaller phrase technology. Any transcriptional errors that result from this process are unintentional.

## 2019-09-17 NOTE — Plan of Care (Signed)
  Problem: Clinical Measurements: Goal: Respiratory complications will improve Outcome: Progressing Goal: Cardiovascular complication will be avoided Outcome: Progressing   Problem: Cardiac: Goal: Ability to achieve and maintain adequate cardiopulmonary perfusion will improve Outcome: Progressing

## 2019-09-17 NOTE — TOC Benefit Eligibility Note (Signed)
Transition of Care [facility]) Benefit Eligibility Note    Patient Details  Name: [name] MRN: [id number] Date of Birth: [birth year]   Medication/Dose: Delene Loll 49/51mg  tablets BID  Covered?: Yes  Prescription Coverage Preferred Pharmacy: Diannia Ruder with Person/Company/Phone Number:: Eddie Dibbles with [employer] at [phone]  Co-Pay: $8.95 estimated copay for 30 day supply  Prior Approval: No  Deductible: (No deductible.  Has LIS on plan.)    Dannette Barbara Phone Number: [phone] 09/17/2019, 10:52 AM

## 2019-09-17 NOTE — Progress Notes (Signed)
Cardiovascular and Pulmonary Nurse Navigator Note:    Follow-up on education provided:    Rounded on patient to review the 5 steps to Living Better with Heart Failure.     #1 - Patient to weigh himself daily.  Patient has scale and plans to start weighing daily.  #2 - Assess how he feels daily  Look at HF Magnet to see which zone weight or / and symptoms correlate with and take action if needed #3 - Take medications as prescribed #4 -  Follow low sodium / carb modified diet and fluid restriction of 64 ounces per day.   #5 - Get moving - Patient to participate in Virtual Cardiac Rehab while waiting the 6 week Medicare waiting period of being on HF medications and out of the hospital prior to starting Cardiac Rehab.   First call with Jasper Loser, Exercise Physiologist, on 09/21/2019 at 10:00 a.m.    Reviewed the other follow-up appointments with patient.  Stressed the importance of keeping all appointments.    Clarene Reamer, FNP on  09/21/2019 at 12 noon.    Dr. Garen Lah on 09/28/2019 at 8:20 a.m.  [facility] on 09/24/2019 at 10:00 a.m.    [place] RN to see patient.    Patient appreciative of the above information.   Roanna Epley, RN, BSN, [facility]  Cardiovascular & Pulmonary Nurse Navigator  Direct Line: [phone]  Department Phone #: [phone] Fax: [phone]  Email Address: [email or web address]

## 2019-09-17 NOTE — TOC Transition Note (Signed)
Transition of Care [facility]) - CM/SW Discharge Note   Patient Details  Name: [name] MRN: [id number] Date of Birth: Oct 01, 1976  Transition of Care [facility]) CM/SW Contact:  Candie Chroman, LCSW Phone Number: 09/17/2019, 10:45 AM   Clinical Narrative: Patient has been accepted by [facility] for home health RN for heart failure protocol. Patient aware and agreeable. Provided patient with 30-day free card for [facility]. No further concerns. CSW signing off.    Final next level of care: Home w Home Health Services Barriers to Discharge: Barriers Resolved   Patient Goals and CMS Choice   CMS Medicare.gov Compare Post Acute Care list provided to:: Patient Choice offered to / list presented to : Patient  Discharge Placement                    Patient and family notified of of transfer: 09/17/19  Discharge Plan and Services     Post Acute Care Choice: Home Health                    HH Arranged: RN [facility] Agency: [place] Date [place]: 09/17/19   Representative spoke with at [place]: Sharmon Revere  Social Determinants of Health (SDOH) Interventions     Readmission Risk Interventions No flowsheet data found.

## 2019-09-18 ENCOUNTER — Telehealth: Payer: Self-pay | Admitting: *Deleted

## 2019-09-18 DIAGNOSIS — I5021 Acute systolic (congestive) heart failure: Secondary | ICD-10-CM | POA: Diagnosis not present

## 2019-09-18 DIAGNOSIS — E1165 Type 2 diabetes mellitus with hyperglycemia: Secondary | ICD-10-CM

## 2019-09-18 DIAGNOSIS — N289 Disorder of kidney and ureter, unspecified: Secondary | ICD-10-CM

## 2019-09-18 DIAGNOSIS — I43 Cardiomyopathy in diseases classified elsewhere: Secondary | ICD-10-CM

## 2019-09-18 DIAGNOSIS — I110 Hypertensive heart disease with heart failure: Secondary | ICD-10-CM | POA: Diagnosis not present

## 2019-09-18 DIAGNOSIS — I420 Dilated cardiomyopathy: Secondary | ICD-10-CM

## 2019-09-18 LAB — GLUCOSE, CAPILLARY
Glucose-Capillary: 148 mg/dL — ABNORMAL HIGH (ref 70–99)
Glucose-Capillary: 192 mg/dL — ABNORMAL HIGH (ref 70–99)
Glucose-Capillary: 186 mg/dL — ABNORMAL HIGH (ref 70–99)
Glucose-Capillary: 182 mg/dL — ABNORMAL HIGH (ref 70–99)

## 2019-09-18 LAB — BASIC METABOLIC PANEL
Anion gap: 14 (ref 5–15)
BUN: 19 mg/dL (ref 6–20)
CO2: 27 mmol/L (ref 22–32)
Calcium: 9.3 mg/dL (ref 8.9–10.3)
Chloride: 100 mmol/L (ref 98–111)
Creatinine, Ser: 1.59 mg/dL — ABNORMAL HIGH (ref 0.61–1.24)
GFR calc Af Amer: >60 mL/min (ref >60)
GFR calc non Af Amer: 52 mL/min — ABNORMAL LOW (ref >60)
Glucose, Bld: 162 mg/dL — ABNORMAL HIGH (ref 70–99)
Potassium: 3.9 mmol/L (ref 3.5–5.1)
Sodium: 141 mmol/L (ref 135–145)

## 2019-09-18 MED ORDER — FUROSEMIDE 10 MG/ML IJ SOLN
40.0000 mg | Freq: Two times a day (BID) | INTRAMUSCULAR | Status: AC
  Administered 2019-09-18: 40 mg via INTRAVENOUS
  Filled 2019-09-18: qty 4

## 2019-09-18 MED ORDER — TORSEMIDE 20 MG PO TABS
40.0000 mg | ORAL_TABLET | Freq: Every day | ORAL | Status: DC
  Administered 2019-09-19: 40 mg via ORAL
  Filled 2019-09-18: qty 2

## 2019-09-18 MED ORDER — SACUBITRIL-VALSARTAN 24-26 MG PO TABS
1.0000 | ORAL_TABLET | Freq: Two times a day (BID) | ORAL | Status: DC
  Administered 2019-09-18 – 2019-09-19 (×3): 1 via ORAL
  Filled 2019-09-18 (×3): qty 1

## 2019-09-18 NOTE — Telephone Encounter (Signed)
Patient still currently admitted. Will plan for TCM call on Monday 11/2 pending the patient's actual discharge date.

## 2019-09-18 NOTE — Progress Notes (Signed)
Progress Note  Patient Name: [name] Date of Encounter: 09/18/2019  Primary Cardiologist: Agbor-Etang  Subjective   Ambulated without issues, states he did push-ups yesterday without limitation. Stills feels "water on my chest." Documented UOP 2.3 L for the past 24 hours with a net negative 7.2 L for the admission. Weight 106-->99.1 kg for the admission. Diuresis has been intermittently slowed by AKI with a slight bump in SCr this morning from 1.32-->1.59. Potassium 3.9. Reds vest 44-->41%.   Inpatient Medications    Scheduled Meds: . atorvastatin  40 mg Oral q1800  . carvedilol  6.25 mg Oral BID WC  . DULoxetine  40 mg Oral Daily  . enoxaparin (LOVENOX) injection  40 mg Subcutaneous Q24H  . furosemide  40 mg Intravenous BID  . insulin aspart  0-5 Units Subcutaneous QHS  . insulin aspart  0-9 Units Subcutaneous TID WC  . insulin glargine  5 Units Subcutaneous QHS  . isosorbide mononitrate  30 mg Oral Daily  . potassium chloride  10 mEq Oral BID  . rOPINIRole  0.25 mg Oral TID  . sacubitril-valsartan  1 tablet Oral BID  . sodium chloride flush  3 mL Intravenous Q12H   Continuous Infusions: . sodium chloride     PRN Meds: sodium chloride, acetaminophen, alum & mag hydroxide-simeth, nitroGLYCERIN, ondansetron (ZOFRAN) IV, sodium chloride flush   Vital Signs    Vitals:   09/17/19 1655 09/17/19 2056 09/18/19 0442 09/18/19 0738  BP: 114/86 120/89 (!) 125/95 123/90  Pulse: 91 100 99 95  Resp: 18 20 20 18   Temp: 98.3 F (36.8 C) 98.7 F (37.1 C) 98.6 F (37 C) 98.6 F (37 C)  TempSrc: Oral Oral Oral Oral  SpO2: 100% 94% 96% 98%  Weight:   99.1 kg   Height:        Intake/Output Summary (Last 24 hours) at 09/18/2019 0802 Last data filed at 09/18/2019 0444 Gross per 24 hour  Intake 720 ml  Output 3030 ml  Net -2310 ml   Filed Weights   09/16/19 0601 09/17/19 0208 09/18/19 0442  Weight: 106 kg 101.5 kg 99.1 kg    Telemetry    SR, 90s bpm -  Personally Reviewed  ECG    No new tracings - Personally Reviewed  Physical Exam   GEN: No acute distress.   Neck: No JVD. Cardiac: RRR, no murmurs, rubs, or gallops.  Respiratory: Clear to auscultation bilaterally.  GI: Soft, nontender, non-distended.   MS: No edema; No deformity. Neuro:  Alert and oriented x 3; Nonfocal.  Psych: Normal affect.  Labs    Chemistry Recent Labs  Lab 09/16/19 0422 09/17/19 0418 09/18/19 0420  NA 141 140 141  K 3.3* 3.5 3.9  CL 104 105 100  CO2 30 27 27   GLUCOSE 146* 174* 162*  BUN 22* 22* 19  CREATININE 1.68* 1.32* 1.59*  CALCIUM 8.9 9.0 9.3  GFRNONAA 49* >60 52*  GFRAA 57* >60 >60  ANIONGAP 7 8 14      Hematology Recent Labs  Lab 09/14/19 0627 09/15/19 0533  WBC 3.9* 3.3*  RBC 4.85 4.95  HGB 13.8 13.9  HCT 41.0 41.4  MCV 84.5 83.6  MCH 28.5 28.1  MCHC 33.7 33.6  RDW 13.4 13.0  PLT 169 166    Cardiac EnzymesNo results for input(s): TROPONINI in the last 168 hours. No results for input(s): TROPIPOC in the last 168 hours.   BNP Recent Labs  Lab 09/14/19 0627 09/15/19 1348  BNP  1,014.0* 1,004.0*     DDimer No results for input(s): DDIMER in the last 168 hours.   Radiology    No results found.  Cardiac Studies   2D echo 09/15/2019: 1. Left ventricular ejection fraction, by visual estimation, is <20%. The left ventricle has severely decreased function. Mildly increased left ventricular size. There is mildly increased left ventricular hypertrophy.  2. Elevated mean left atrial pressure.  3. Left ventricular diastolic Doppler parameters are consistent with restrictive filling pattern of LV diastolic filling.  4. Global right ventricle has moderately reduced systolic function.The right ventricular size is normal. No increase in right ventricular wall thickness.  5. Left atrial size was moderately dilated.  6. Right atrial size was mildly dilated.  7. The mitral valve is normal in structure. Mild mitral valve  regurgitation.  8. The tricuspid valve is not well visualized. Tricuspid valve regurgitation is trivial.  9. The aortic valve is tricuspid Aortic valve regurgitation was not visualized by color flow Doppler. Structurally normal aortic valve, with no evidence of sclerosis or stenosis. 10. The pulmonic valve was grossly normal. Pulmonic valve regurgitation is trivial by color flow Doppler. 11. TR signal is inadequate for assessing pulmonary artery systolic pressure. 12. The inferior vena cava is normal in size with <50% respiratory variability, suggesting right atrial pressure of 8 mmHg. 13. The interatrial septum was not well visualized.  Patient Profile     43 y.o. male with history of DM2, HTN, and HLD admitted with acute systolic CHF.   Assessment & Plan    1. HFrEF secondary to NICM: -Admitted with increased SOB with EF found to be < 20% by echo with Lexiscan showing no evidence of ischemia  -ReD vest improving from 44-->41% -Continues to "feel water on my chest" -Has ambulated and done push ups while admitted without limitation  -Weight trend 230-->218 pounds for the admission  -Etiology of his cardiomyopathy felt to be longstanding HTN -Started on Entresto 10/29, continue -Continue Coreg -Look to discontinue Imdur with preference to add spironolactone, pending his renal function, to optimize medical therapy, prior to discharge or in follow up  -Repeat ReDs vest today -Continue IV Lasix today -Needs at least another 24 hours, possibly longer, of inpatient IV diuresis  -Following optimization of medication he will need a repeat echo in ~ 3 months to reassess LVSF, if his EF remains less than 25% at that time recommend referral to EP for consideration of ICD -Consider cMRI as an outpatient  -CHF education  2. Hypertensive heart disease: -BP well controlled -Continue current medications as above  3. HLD: -LDL of 60 this admission -Lipitor  4. AKI: -Diuresis has been  intermittently slowed this admission with slight bumping of renal function -Close monitoring of renal function with diuresis   For questions or updates, please contact [place] Please consult [email or web address] for contact info under Cardiology/STEMI.    Signed, Christell Faith, PA-C [place] Pager: [phone] 09/18/2019, 8:02 AM

## 2019-09-18 NOTE — Plan of Care (Signed)
  Problem: Education: Goal: Knowledge of General Education information will improve Description: Including pain rating scale, medication(s)/side effects and non-pharmacologic comfort measures Outcome: Progressing   Problem: Clinical Measurements: Goal: Ability to maintain clinical measurements within normal limits will improve Outcome: Progressing Goal: Respiratory complications will improve Outcome: Progressing   Problem: Safety: Goal: Ability to remain free from injury will improve Outcome: Progressing   

## 2019-09-18 NOTE — Telephone Encounter (Signed)
-----   Message from Jacquelyn D Visser, PA-C sent at 09/17/2019  7:19 PM EDT ----- °Regarding: TCM °Hello, ° °This patient was admitted and seen at [facility] for new systolic heart failure/ NICM. ° °We are expecting discharge tonight or tomorrow 10/30. ° °1) Can you please schedule a BMET to occur within the next 3-4 days? We need to keep a close eye on his renal function. °2) Can you please call and arrange / schedule for follow-up TCM appointment with Dr. Agbor-Etang within the next two weeks? ° °Thank you! °Signed, °Jacquelyn D Visser, PA-C °09/17/2019, 7:20 PM °Pager [phone]  °  ° °

## 2019-09-18 NOTE — Telephone Encounter (Signed)
BMP order placed for [facility] on 11/3. Patient is still admitted as of this afternoon.

## 2019-09-18 NOTE — Telephone Encounter (Signed)
-----   Message from Arvil Chaco, PA-C sent at 09/17/2019  7:19 PM EDT ----- Regarding: TCM Hello,  This patient was admitted and seen at [facility] for new systolic heart failure/ NICM.  We are expecting discharge tonight or tomorrow 10/30.  1) Can you please schedule a BMET to occur within the next 3-4 days? We need to keep a close eye on his renal function. 2) Can you please call and arrange / schedule for follow-up TCM appointment with Dr. Garen Lah within the next two weeks?  Thank you! Signed, Arvil Chaco, PA-C 09/17/2019, 7:20 PM Pager [phone]

## 2019-09-18 NOTE — Telephone Encounter (Signed)
TCM scheduled 11/9 820 am with Agbor.   Hospital adding 11/3  [facility] at [facility] to dc summary .  Orders not in [place].

## 2019-09-18 NOTE — Progress Notes (Signed)
[facility] [place] NAME: [name]    MR#:  [id number]  DATE OF BIRTH:  [birth year]  SUBJECTIVE:   Patient ended up having an elevated ReDS vest reading last night and was kept for continued diuresis. He states he is feeling okay today. Still a little short of breath.  REVIEW OF SYSTEMS:  Review of Systems  Constitutional: Negative for chills and fever.  HENT: Negative for congestion and sore throat.   Eyes: Negative for blurred vision and double vision.  Respiratory: Negative for cough and shortness of breath.   Cardiovascular: Negative for chest pain, palpitations and leg swelling.  Gastrointestinal: Negative for nausea and vomiting.  Genitourinary: Negative for dysuria and urgency.  Musculoskeletal: Negative for back pain and neck pain.  Neurological: Negative for dizziness and headaches.  Psychiatric/Behavioral: Negative for depression. The patient is not nervous/anxious.     DRUG ALLERGIES:   Allergies  Allergen Reactions  . Food     Chicken, eggs and peaches   VITALS:  Blood pressure 123/90, pulse 95, temperature 98.6 F (37 C), temperature source Oral, resp. rate 18, height 6' 1.5" (1.867 m), weight 99.1 kg, SpO2 98 %. PHYSICAL EXAMINATION:  Physical Exam  GENERAL:  Laying in the bed with no acute distress.  Well-appearing. HEENT: Head atraumatic, normocephalic. Pupils equal, round, reactive to light and accommodation. No scleral icterus. Extraocular muscles intact. Oropharynx and nasopharynx clear.  NECK:  Supple, no jugular venous distention. No thyroid enlargement. LUNGS: Lungs are clear to auscultation bilaterally. No wheezes, crackles, rhonchi. No use of accessory muscles of respiration.  CARDIOVASCULAR: RRR, S1, S2 normal. No murmurs, rubs, or gallops.  ABDOMEN: Soft, nontender, nondistended. Bowel sounds present.  EXTREMITIES: No pedal edema, cyanosis, or clubbing.  NEUROLOGIC: CN 2-12 intact, no focal  deficits. 5/5 muscle strength throughout all extremities. Sensation intact throughout. Gait not checked.  PSYCHIATRIC: The patient is alert and oriented x 3.  SKIN: No obvious rash, lesion, or ulcer.  LABORATORY PANEL:  Male CBC Recent Labs  Lab 09/15/19 0533  WBC 3.3*  HGB 13.9  HCT 41.4  PLT 166   ------------------------------------------------------------------------------------------------------------------ Chemistries  Recent Labs  Lab 09/18/19 0420  NA 141  K 3.9  CL 100  CO2 27  GLUCOSE 162*  BUN 19  CREATININE 1.59*  CALCIUM 9.3   RADIOLOGY:  No results found. ASSESSMENT AND PLAN:   Acute systolic CHF- improving. UOP 3L in the last 24 hours. -ECHO this admission with EF <20% and severely decreased left ventricular function -Continue lasix 40mg  IV bid -Continue entresto and coreg -Cardiology following -Strict I/O, daily weights -Cardiac monitoring  Atypical chest pain- may be related to above. Resolved. Pershing Proud was intermediate risk -Needs to f/u with cardiology as an outpatient  Hypertension- BP has been overall well controlled. -Continue Coreg and entresto  CKD II- creatinine has bumped due to starting entresto -Avoid nephrotoxic agents -Monitor  Type 1 diabetes- A1c 7.5% this admission. -Lantus and SSI  Hyperlipidemia- LDL 60 this admission -Continue lipitor to 40mg  daily  Possible discharge home later today.  All the records are reviewed and case discussed with Care Management/Social Worker. Management plans discussed with the patient, family and they are in agreement.  CODE STATUS: Full Code  TOTAL TIME TAKING CARE OF THIS PATIENT: 38 minutes.   More than 50% of the time was spent in counseling/coordination of care: YES  POSSIBLE D/C IN 1-2 DAYS, DEPENDING ON CLINICAL CONDITION.   Berna Spare  [place]o M.D on 09/18/2019 at 11:57 AM  Between 7am to 6pm - Pager - [phone]  After 6pm go to [email or web address] - Social research officer, government   [employer] [facility]  Office  [phone]  CC: Primary care physician; Emi Belfast, FNP  Note: This dictation was prepared with Dragon dictation along with smaller phrase technology. Any transcriptional errors that result from this process are unintentional.

## 2019-09-19 DIAGNOSIS — I110 Hypertensive heart disease with heart failure: Secondary | ICD-10-CM | POA: Diagnosis not present

## 2019-09-19 DIAGNOSIS — I420 Dilated cardiomyopathy: Secondary | ICD-10-CM

## 2019-09-19 DIAGNOSIS — E1021 Type 1 diabetes mellitus with diabetic nephropathy: Secondary | ICD-10-CM

## 2019-09-19 DIAGNOSIS — I509 Heart failure, unspecified: Secondary | ICD-10-CM | POA: Diagnosis not present

## 2019-09-19 DIAGNOSIS — I5021 Acute systolic (congestive) heart failure: Secondary | ICD-10-CM | POA: Diagnosis not present

## 2019-09-19 LAB — BASIC METABOLIC PANEL
Anion gap: 10 (ref 5–15)
BUN: 26 mg/dL — ABNORMAL HIGH (ref 6–20)
CO2: 30 mmol/L (ref 22–32)
Calcium: 9.2 mg/dL (ref 8.9–10.3)
Chloride: 100 mmol/L (ref 98–111)
Creatinine, Ser: 1.75 mg/dL — ABNORMAL HIGH (ref 0.61–1.24)
GFR calc Af Amer: 54 mL/min — ABNORMAL LOW (ref >60)
GFR calc non Af Amer: 47 mL/min — ABNORMAL LOW (ref >60)
Glucose, Bld: 202 mg/dL — ABNORMAL HIGH (ref 70–99)
Potassium: 4 mmol/L (ref 3.5–5.1)
Sodium: 140 mmol/L (ref 135–145)

## 2019-09-19 LAB — GLUCOSE, CAPILLARY
Glucose-Capillary: 175 mg/dL — ABNORMAL HIGH (ref 70–99)
Glucose-Capillary: 293 mg/dL — ABNORMAL HIGH (ref 70–99)

## 2019-09-19 MED ORDER — SACUBITRIL-VALSARTAN 24-26 MG PO TABS: 1.0000 | ORAL_TABLET | Freq: Two times a day (BID) | ORAL | 0 refills | Status: DC

## 2019-09-19 NOTE — Discharge Instructions (Signed)
It was so nice to meet you during this hospitalization!  You came into the hospital with shortness of breath. We found that you have heart failure and that your heart is not squeezing as strong as it should.  I have prescribed the following medications: 1. I have increased your lipitor dose from 20mg  daily to 40mg  daily 2. Please take Coreg 6.25mg  twice daily 3. Please take Entresto 24-26mg  twice daily 4. Please take Torsemide 40mg  (2 tablets) daily. This is your fluid pill. If you have greater than a 3 lb weight gain in 1 day, you should take an extra 40mg  of torsemide after lunch. 5. Please take K-dur 69mEq daily- this is a potassium pill to keep your potassium levels up, because torsemide always causes potassium levels to be low.  Take care, Dr. Brett Albino

## 2019-09-19 NOTE — Progress Notes (Signed)
ReDs vest reading 36%

## 2019-09-19 NOTE — Progress Notes (Addendum)
Progress Note  Patient Name: [name] Date of Encounter: 09/19/2019  Primary Cardiologist: Garen Lah  Subjective    Reds vest 44-->41%-->37%---36%  Last measurement taken today Overall he reports feeling relatively well, no significant chest tightness or abdominal bloating Overall blood pressure stable 735 systolic Tolerating his new medication regiment Has ambulated in the hallway  Inpatient Medications    Scheduled Meds: . atorvastatin  40 mg Oral q1800  . carvedilol  6.25 mg Oral BID WC  . DULoxetine  40 mg Oral Daily  . enoxaparin (LOVENOX) injection  40 mg Subcutaneous Q24H  . insulin aspart  0-5 Units Subcutaneous QHS  . insulin aspart  0-9 Units Subcutaneous TID WC  . insulin glargine  5 Units Subcutaneous QHS  . isosorbide mononitrate  30 mg Oral Daily  . potassium chloride  10 mEq Oral BID  . rOPINIRole  0.25 mg Oral TID  . sacubitril-valsartan  1 tablet Oral BID  . sodium chloride flush  3 mL Intravenous Q12H  . torsemide  40 mg Oral Daily   Continuous Infusions: . sodium chloride     PRN Meds: sodium chloride, acetaminophen, alum & mag hydroxide-simeth, nitroGLYCERIN, ondansetron (ZOFRAN) IV, sodium chloride flush   Vital Signs    Vitals:   09/18/19 1939 09/18/19 2308 09/19/19 0354 09/19/19 0715  BP: 108/86 122/84 (!) 124/97 (!) 125/92  Pulse: 89 (!) 50 95 98  Resp: 19  20 19   Temp: 98.8 F (37.1 C) 99.4 F (37.4 C) 98.4 F (36.9 C) 98.4 F (36.9 C)  TempSrc: Oral Oral Oral Oral  SpO2: 96% 98% 96% 98%  Weight:   98.6 kg   Height:        Intake/Output Summary (Last 24 hours) at 09/19/2019 1418 Last data filed at 09/19/2019 0357 Gross per 24 hour  Intake -  Output 1390 ml  Net -1390 ml   Filed Weights   09/17/19 0208 09/18/19 0442 09/19/19 0354  Weight: 101.5 kg 99.1 kg 98.6 kg    Telemetry    SR, 90s bpm - Personally Reviewed  ECG    No new tracings - Personally Reviewed  Physical Exam   Constitutional:  oriented  to person, place, and time. No distress.  HENT:  Head: Normocephalic and atraumatic.  Eyes:  no discharge. No scleral icterus.  Neck: Normal range of motion. Neck supple. JVD 8-10  Cardiovascular: Normal rate, regular rhythm, normal heart sounds and intact distal pulses. Exam reveals no gallop and no friction rub. No edema No murmur heard. Pulmonary/Chest: Effort normal and breath sounds normal. No stridor. No respiratory distress.  no wheezes.  no rales.  no tenderness.  Abdominal: Soft.  no distension.  no tenderness.  Musculoskeletal: Normal range of motion.  no  tenderness or deformity.  Neurological:  normal muscle tone. Coordination normal. No atrophy Skin: Skin is warm and dry. No rash noted. not diaphoretic.  Psychiatric:  normal mood and affect. behavior is normal. Thought content normal.    Labs    Chemistry Recent Labs  Lab 09/17/19 0418 09/18/19 0420 09/19/19 0434  NA 140 141 140  K 3.5 3.9 4.0  CL 105 100 100  CO2 27 27 30   GLUCOSE 174* 162* 202*  BUN 22* 19 26*  CREATININE 1.32* 1.59* 1.75*  CALCIUM 9.0 9.3 9.2  GFRNONAA >60 52* 47*  GFRAA >60 >60 54*  ANIONGAP 8 14 10      Hematology Recent Labs  Lab 09/14/19 0627 09/15/19 0533  WBC 3.9* 3.3*  RBC 4.85 4.95  HGB 13.8 13.9  HCT 41.0 41.4  MCV 84.5 83.6  MCH 28.5 28.1  MCHC 33.7 33.6  RDW 13.4 13.0  PLT 169 166    Cardiac EnzymesNo results for input(s): TROPONINI in the last 168 hours. No results for input(s): TROPIPOC in the last 168 hours.   BNP Recent Labs  Lab 09/14/19 0627 09/15/19 1348  BNP 1,014.0* 1,004.0*     DDimer No results for input(s): DDIMER in the last 168 hours.   Radiology    No results found.  Cardiac Studies   2D echo 09/15/2019: 1. Left ventricular ejection fraction, by visual estimation, is <20%. The left ventricle has severely decreased function. Mildly increased left ventricular size. There is mildly increased left ventricular hypertrophy.  2. Elevated mean  left atrial pressure.  3. Left ventricular diastolic Doppler parameters are consistent with restrictive filling pattern of LV diastolic filling.  4. Global right ventricle has moderately reduced systolic function.The right ventricular size is normal. No increase in right ventricular wall thickness.  5. Left atrial size was moderately dilated.  6. Right atrial size was mildly dilated.  7. The mitral valve is normal in structure. Mild mitral valve regurgitation.  8. The tricuspid valve is not well visualized. Tricuspid valve regurgitation is trivial.  9. The aortic valve is tricuspid Aortic valve regurgitation was not visualized by color flow Doppler. Structurally normal aortic valve, with no evidence of sclerosis or stenosis. 10. The pulmonic valve was grossly normal. Pulmonic valve regurgitation is trivial by color flow Doppler. 11. TR signal is inadequate for assessing pulmonary artery systolic pressure. 12. The inferior vena cava is normal in size with <50% respiratory variability, suggesting right atrial pressure of 8 mmHg. 13. The interatrial septum was not well visualized.  Patient Profile     43 y.o. male with history of DM2, HTN, and HLD admitted with acute systolic CHF.   Assessment & Plan    Nonischemic cardiomyopathy/ Stress test this admission no ischemia Echocardiogram ejection fraction less than 20% --- Most with hypertensive heart disease -Tolerating carvedilol, spironolactone, Imdur, low-dose Entresto Torsemide 40 daily Recommend extra 40 torsemide after lunch for 3 pound weight gain REDS VEST 36 today Weight is between  208 pounds Long discussion, CHF education divided Again recommended moderating his fluid intake, salt intake, compliance with his medicines, extra torsemide for 3 pound weight gain  hypertensive heart disease with CHF Blood pressure dramatically improved Stressed importance of medication compliance -Recommend he call our office for any difficulties  with his medications  Acute on chronic renal dysfunction 8 L negative this admission Creatinine elevated from aggressive diuresis -He has been changed from IV Lasix to torsemide 40 daily  Diabetes type 2 Previous hemoglobin A1c of thirteen 1 year ago down to 7.5 this admission Recommended strict diet Needs close follow-up with outpatient physician   CHF management discussed with him Medications discussed with him, discharge instructions and follow-up discussed Total encounter time more than 35 minutes Greater than 50% was spent in counseling and coordination of care with the patient   For questions or updates, please contact [facility] Please consult [email or web address] for contact info under Cardiology/STEMI.    Signed, Dossie Arbour, MD, Ph.D [facility]

## 2019-09-19 NOTE — Discharge Summary (Signed)
[facility] [place] at [facility]   PATIENT NAME: [name]    MR#:  [id number]  DATE OF BIRTH:  [birth year]  DATE OF ADMISSION:  09/14/2019   ADMITTING PHYSICIAN: Campbell Stall, MD  DATE OF DISCHARGE: 09/19/19  PRIMARY CARE PHYSICIAN: Emi Belfast, FNP   ADMISSION DIAGNOSIS:  Orthopnea [R06.01] Acute congestive heart failure, unspecified heart failure type (HCC) [I50.9] DISCHARGE DIAGNOSIS:  Active Problems:   Acute CHF (HCC)   Acute systolic heart failure (HCC)  SECONDARY DIAGNOSIS:   Past Medical History:  Diagnosis Date  . Diabetes mellitus   . ED (erectile dysfunction)   . Hypertension    HOSPITAL COURSE:   Javohn is a 43 year old male who presented to the ED with pain and shortness of breath.  In the ED, chest x-ray showed some mild vascular congestion.  He was admitted for further management.  Acute systolic CHF- improving. UOP1.87L in the last 24 hours. -ECHO this admission with EF <20% and severely decreased left ventricular function -Treated with IV Lasix and then transitioned to torsemide 40 mg daily on discharge; patient should take an extra 40mg  for weight gain > 3lb -Discharged on Coreg 6.25 mg twice daily -Started on Entresto this admission -Patient will follow up with cardiologist and heart failure clinic in the next 1-2 weeks -Home health heart failure protocol was ordered on discharge  Atypical chest pain- may be related to above. Resolved. was intermediate risk -Patient will follow-up with cardiology as an outpatient -Consider outpatient cath  Hypertension- BPhas been overall well-controlled. -Discharged on Coreg and Entresto  CKD II-creatinine bumped this admission after starting entresto. -Needs BMP rechecked as an outpatient  Type1diabetes- A1c 7.5% this admission. -Home diabetes medicines were continued  Hyperlipidemia- LDL 60 this admission -Lipitor dose was increased to 40  mg daily  DISCHARGE CONDITIONS:  Acute systolic congestive heart failure Hypertension CKD II Type 1 diabetes Hyperlipidemia CONSULTS OBTAINED:  Treatment Team:  Norberta Keens, MD DRUG ALLERGIES:   Allergies  Allergen Reactions  . Food     Chicken, eggs and peaches   DISCHARGE MEDICATIONS:   Allergies as of 09/19/2019      Reactions   Food    Chicken, eggs and peaches      Medication List    STOP taking these medications   furosemide 20 MG tablet Commonly known as: Lasix   gabapentin 600 MG tablet Commonly known as: NEURONTIN   ibuprofen 200 MG tablet Commonly known as: ADVIL   lisinopril 40 MG tablet Commonly known as: ZESTRIL     TAKE these medications   atorvastatin 40 MG tablet Commonly known as: LIPITOR Take 1 tablet (40 mg total) by mouth daily at 6 PM. What changed:   medication strength  how much to take  when to take this   carvedilol 6.25 MG tablet Commonly known as: COREG Take 1 tablet (6.25 mg total) by mouth 2 (two) times daily with a meal.   DULoxetine 20 MG capsule Commonly known as: CYMBALTA Take 2 capsules by mouth daily.   NovoLIN 70/30 FlexPen (70-30) 100 UNIT/ML PEN Generic drug: Insulin Isophane & Regular Human Inject 25 Units into the skin 3 (three) times daily.   potassium chloride SA 20 MEQ tablet Commonly known as: KLOR-CON Take 1 tablet (20 mEq total) by mouth 2 (two) times daily.   rOPINIRole 0.25 MG tablet Commonly known as: REQUIP Take 1 tablet by mouth 3 (three) times daily.   sacubitril-valsartan 24-26 MG  Commonly known as: ENTRESTO Take 1 tablet by mouth 2 (two) times daily.   torsemide 20 MG tablet Commonly known as: DEMADEX Take 2 tablets (40 mg total) by mouth daily.        DISCHARGE INSTRUCTIONS:  1.  Follow-up with PCP in 5 days 2.  Follow-up in heart failure clinic in 1-2 weeks 3.  Follow-up with cardiology in 1-2 weeks 4.  Take Coreg and Entresto as prescribed 5.  Lipitor dose was  increased to 40 mg daily 6.  Take torsemide 40 mg daily and K-Dur 20 mEq daily. Take an extra dose of torsemide 40mg  daily in the afternoon for weight gain >3 lb DIET:  Cardiac diet and Diabetic diet DISCHARGE CONDITION:  Stable ACTIVITY:  Activity as tolerated OXYGEN:  Home Oxygen: No.  Oxygen Delivery: room air DISCHARGE LOCATION:  home   If you experience worsening of your admission symptoms, develop shortness of breath, life threatening emergency, suicidal or homicidal thoughts you must seek medical attention immediately by calling 911 or calling your MD immediately  if symptoms less severe.  You Must read complete instructions/literature along with all the possible adverse reactions/side effects for all the Medicines you take and that have been prescribed to you. Take any new Medicines after you have completely understood and accpet all the possible adverse reactions/side effects.   Please note  You were cared for by a hospitalist during your hospital stay. If you have any questions about your discharge medications or the care you received while you were in the hospital after you are discharged, you can call the unit and asked to speak with the hospitalist on call if the hospitalist that took care of you is not available. Once you are discharged, your primary care physician will handle any further medical issues. Please note that NO REFILLS for any discharge medications will be authorized once you are discharged, as it is imperative that you return to your primary care physician (or establish a relationship with a primary care physician if you do not have one) for your aftercare needs so that they can reassess your need for medications and monitor your lab values.    On the day of Discharge:  VITAL SIGNS:  Blood pressure (!) 125/92, pulse 98, temperature 98.4 F (36.9 C), temperature source Oral, resp. rate 19, height 6' 1.5" (1.867 m), weight 98.6 kg, SpO2 98 %. PHYSICAL  EXAMINATION:  GENERAL:  43 y.o.-year-old patient lying in the bed with no acute distress.  EYES: Pupils equal, round, reactive to light and accommodation. No scleral icterus. Extraocular muscles intact.  HEENT: Head atraumatic, normocephalic. Oropharynx and nasopharynx clear.  NECK:  Supple, no jugular venous distention. No thyroid enlargement, no tenderness.  LUNGS: Normal breath sounds bilaterally, no wheezing, rales,rhonchi or crepitation. No use of accessory muscles of respiration.  CARDIOVASCULAR: RRR, S1, S2 normal. No murmurs, rubs, or gallops.  ABDOMEN: Soft, non-tender, non-distended. Bowel sounds present. No organomegaly or mass.  EXTREMITIES: No pedal edema, cyanosis, or clubbing.  NEUROLOGIC: Cranial nerves II through XII are intact. Muscle strength 5/5 in all extremities. Sensation intact. Gait not checked.  PSYCHIATRIC: The patient is alert and oriented x 3.  SKIN: No obvious rash, lesion, or ulcer.  DATA REVIEW:   CBC Recent Labs  Lab 09/15/19 0533  WBC 3.3*  HGB 13.9  HCT 41.4  PLT 166    Chemistries  Recent Labs  Lab 09/19/19 0434  NA 140  K 4.0  CL 100  CO2 30  GLUCOSE  202*  BUN 26*  CREATININE 1.75*  CALCIUM 9.2     Microbiology Results  Results for orders placed or performed during the hospital encounter of 09/14/19  SARS CORONAVIRUS 2 (TAT 6-24 HRS) Nasopharyngeal Nasopharyngeal Swab     Status: None   Collection Time: 09/14/19  7:25 AM   Specimen: Nasopharyngeal Swab  Result Value Ref Range Status   SARS Coronavirus 2 NEGATIVE NEGATIVE Final    Comment: (NOTE) SARS-CoV-2 target nucleic acids are NOT DETECTED. The SARS-CoV-2 RNA is generally detectable in upper and lower respiratory specimens during the acute phase of infection. Negative results do not preclude SARS-CoV-2 infection, do not rule out co-infections with other pathogens, and should not be used as the sole basis for treatment or other patient management decisions. Negative results  must be combined with clinical observations, patient history, and epidemiological information. The expected result is Negative. Fact Sheet for Patients: HairSlick.nohttps://www.fda.gov/media/138098/download Fact Sheet for Healthcare Providers: quierodirigir.comhttps://www.fda.gov/media/138095/download This test is not yet approved or cleared by the [place] FDA and  has been authorized for detection and/or diagnosis of SARS-CoV-2 by FDA under an Emergency Use Authorization (EUA). This EUA will remain  in effect (meaning this test can be used) for the duration of the COVID-19 declaration under Section 56 4(b)(1) of the Act, 21 U.S.C. section 360bbb-3(b)(1), unless the authorization is terminated or revoked sooner. Performed at [facility], [address] [place], [place] [address]   SARS Coronavirus 2 by RT PCR (hospital order, performed in [facility] hospital lab) Nasopharyngeal Nasopharyngeal Swab     Status: None   Collection Time: 09/14/19 12:22 PM   Specimen: Nasopharyngeal Swab  Result Value Ref Range Status   SARS Coronavirus 2 NEGATIVE NEGATIVE Final    Comment: (NOTE) If result is NEGATIVE SARS-CoV-2 target nucleic acids are NOT DETECTED. The SARS-CoV-2 RNA is generally detectable in upper and lower  respiratory specimens during the acute phase of infection. The lowest  concentration of SARS-CoV-2 viral copies this assay can detect is 250  copies / mL. A negative result does not preclude SARS-CoV-2 infection  and should not be used as the sole basis for treatment or other  patient management decisions.  A negative result may occur with  improper specimen collection / handling, submission of specimen other  than nasopharyngeal swab, presence of viral mutation(s) within the  areas targeted by this assay, and inadequate number of viral copies  (<250 copies / mL). A negative result must be combined with clinical  observations, patient history, and epidemiological information. If result is  POSITIVE SARS-CoV-2 target nucleic acids are DETECTED. The SARS-CoV-2 RNA is generally detectable in upper and lower  respiratory specimens dur ing the acute phase of infection.  Positive  results are indicative of active infection with SARS-CoV-2.  Clinical  correlation with patient history and other diagnostic information is  necessary to determine patient infection status.  Positive results do  not rule out bacterial infection or co-infection with other viruses. If result is PRESUMPTIVE POSTIVE SARS-CoV-2 nucleic acids MAY BE PRESENT.   A presumptive positive result was obtained on the submitted specimen  and confirmed on repeat testing.  While 2019 novel coronavirus  (SARS-CoV-2) nucleic acids may be present in the submitted sample  additional confirmatory testing may be necessary for epidemiological  and / or clinical management purposes  to differentiate between  SARS-CoV-2 and other Sarbecovirus currently known to infect humans.  If clinically indicated additional testing with an alternate test  methodology [phone]  is advised. The SARS-CoV-2 RNA is generally  detectable in upper and lower respiratory sp ecimens during the acute  phase of infection. The expected result is Negative. Fact Sheet for Patients:  BoilerBrush.com.cy Fact Sheet for Healthcare Providers: https://pope.com/ This test is not yet approved or cleared by the [place] FDA and has been authorized for detection and/or diagnosis of SARS-CoV-2 by FDA under an Emergency Use Authorization (EUA).  This EUA will remain in effect (meaning this test can be used) for the duration of the COVID-19 declaration under Section 564(b)(1) of the Act, 21 U.S.C. section 360bbb-3(b)(1), unless the authorization is terminated or revoked sooner. Performed at [facility], [address] [place], [place] [address]     RADIOLOGY:  No results found.   Management  plans discussed with the patient, family and they are in agreement.  CODE STATUS: Full Code   TOTAL TIME TAKING CARE OF THIS PATIENT: 40 minutes.    Jinny Blossom  M.D on 09/19/2019 at 1:19 PM  Between 7am to 6pm - Pager - [phone]  After 6pm go to [email or web address] - Social research officer, government  [facility] [place] [facility]  Office  [phone]  CC: Primary care physician; Emi Belfast, FNP   Note: This dictation was prepared with Dragon dictation along with smaller phrase technology. Any transcriptional errors that result from this process are unintentional.

## 2019-09-19 NOTE — Progress Notes (Signed)
Discharge instructions explained to pt and pts wife/ verbalized an understanding/ iv and tele removed/ transported off unti via wheelchair.

## 2019-09-21 ENCOUNTER — Other Ambulatory Visit: Payer: Self-pay

## 2019-09-21 ENCOUNTER — Encounter: Payer: Medicare Other | Attending: Internal Medicine

## 2019-09-21 ENCOUNTER — Encounter: Payer: Self-pay | Admitting: Family Medicine

## 2019-09-21 ENCOUNTER — Ambulatory Visit (INDEPENDENT_AMBULATORY_CARE_PROVIDER_SITE_OTHER): Payer: Medicare Other | Admitting: Family Medicine

## 2019-09-21 VITALS — BP 118/68 | HR 84 | Temp 97.7°F | Wt 220.0 lb

## 2019-09-21 DIAGNOSIS — I5021 Acute systolic (congestive) heart failure: Secondary | ICD-10-CM

## 2019-09-21 DIAGNOSIS — I110 Hypertensive heart disease with heart failure: Secondary | ICD-10-CM

## 2019-09-21 DIAGNOSIS — M545 Low back pain, unspecified: Secondary | ICD-10-CM

## 2019-09-21 DIAGNOSIS — Z09 Encounter for follow-up examination after completed treatment for conditions other than malignant neoplasm: Secondary | ICD-10-CM

## 2019-09-21 DIAGNOSIS — I5022 Chronic systolic (congestive) heart failure: Secondary | ICD-10-CM

## 2019-09-21 LAB — COMPREHENSIVE METABOLIC PANEL
ALT: 27 U/L (ref 0–53)
AST: 13 U/L (ref 0–37)
Albumin: 4.3 g/dL (ref 3.5–5.2)
Alkaline Phosphatase: 60 U/L (ref 39–117)
BUN: 38 mg/dL — ABNORMAL HIGH (ref 6–23)
CO2: 31 mEq/L (ref 19–32)
Calcium: 9.2 mg/dL (ref 8.4–10.5)
Chloride: 98 mEq/L (ref 96–112)
Creatinine, Ser: 1.75 mg/dL — ABNORMAL HIGH (ref 0.40–1.50)
GFR: 51.49 mL/min — ABNORMAL LOW (ref >60.00)
Glucose, Bld: 85 mg/dL (ref 70–99)
Potassium: 3.6 mEq/L (ref 3.5–5.1)
Sodium: 140 mEq/L (ref 135–145)
Total Bilirubin: 0.6 mg/dL (ref 0.2–1.2)
Total Protein: 6.8 g/dL (ref 6.0–8.3)

## 2019-09-21 LAB — BRAIN NATRIURETIC PEPTIDE: Pro B Natriuretic peptide (BNP): 62 pg/mL (ref 0.0–100.0)

## 2019-09-21 MED ORDER — BLOOD PRESSURE MONITOR/L CUFF MISC: 1.0000 [IU] | 0 refills | Status: DC | PRN

## 2019-09-21 NOTE — Patient Instructions (Signed)
Good to see you today  I have sent in a new prescription for a home blood pressure cuff to your pharmacy, please let me know if you have any problems with it  Please stop hydralazine and imdur- I think you had too much blood pressure medication and it lowered your blood pressure too low  Please check your blood pressure in the morning after you have been up for a little while but before you take your medications.   If you blood pressure is less than 100, do not take your Entresto or Carvedilol. Call cardiology office. Take your other medications as directed.   Follow up in 3 months, sooner if needed  For your back, try extra strength Tylenol and over the counter lidocaine patches, heat, massage, gentle range of motion.

## 2019-09-21 NOTE — Progress Notes (Signed)
Virtual Heart Failure Phone Call  This EP called participant at 10:10am-10:30am on 09/21/19. Connection was spotty at times.   This EP explained to participant that weekly phone calls, messages through [employer], emails, EMMI videos, etc were some of the resources that our program would use to keep in contact with him over the 6 week period while he stabilizes on his meds and before we bring him into the program in person.   Participant received the 5 Simple Steps to Living Better with Heart Failure Booklet. Patient verbalizes understanding of daily weights, zone magnet, sodium intake, fluid restriction (64oz), taking meds as prescribed, and eventually exercising. Participant experienced low blood pressure of 97/63 and 84/48 and dizziness yesterday. Participant called his on call nurse and nurse advised patient to hold BP meds until seeing his Cardiologist at noon today. Participant has made drastic changes since his hospitalization. Patient was praised for making changes and for calling his nurse when he had a low reading and felt poorly.  Participant reports blood sugar btwn 145-200mg /dL. This EP encouraged participant that exercise would help to lower those numbers.  Participant reports playing basketball with friends once a week and walking regularly prior to his hospitalization. Participant does not feel well enough to exercise at this time.   This EP scheduled to call Patient next Monday at 10am.  Participant will be sent invitation to [employer] app today.  Confirm Consent - "you are scheduled to join our "At Home" [facility]  Cardiac or Pulmonary  Rehab program . Just as we do with many in-gym visits, in order for you to participate in this program, we must obtain consent.  If you'd like, I can send this to your mychart (if signed up) or email for you to review.  Otherwise, I can obtain your verbal consent now.  By agreeing to a Cardiac or Pulmonary Rehab Telehealth visit, we'd like you to  understand that the technology does not allow for your Cardiac or Pulmonary Rehab team member to perform a physical assessment, and thus may limit their ability to fully assess your ability to perform exercise programs. If your provider identifies any concerns that need to be evaluated in person, we will make arrangements to do so.  Finally, though the technology is pretty good, we cannot assure that it will always work on either your or our end and we cannot ensure that we have a secure connection.  Cardiac and Pulmonary Rehab Telehealth visits and "At Home" cardiac and pulmonary rehab are provided at no cost to you. Are you willing to proceed?" STAFF: Did the patient verbally acknowledge consent to telehealth visit?  YES 09/21/19 4:20pm       Date and Time                                                  Staff completing consent process:  Jasper Loser

## 2019-09-21 NOTE — Progress Notes (Signed)
Subjective:    Patient ID: [name], male    DOB: Mar 24, 1976, 43 y.o.   MRN: [id number]  HPI This is a 43 yo male, accompanied by his wife, who presents today for hospital follow up.  He was admitted 10/26- 09/19/19 with orthopnea and acute congestive heart failure. He presented to the ED with pain and SOB. Chest xray showed mild vascular congestion and he was admitted. He had echo which showed EF <20% and severely decreased left ventricular function. He was treated with iv lasix and transitioned to po torsemide with prn dosing for weight gain. Also, cogreg 6.25 mg bid and Entresto were added. Atypical chest pain resolved. Myoview was intermediate risk.   Left sided back pain that started yesterday. Legs felt weighed down. Felt heavy. Some improvement today.   Blurred vision on and off awhile, negative optometrist visit last month.   Blood sugars- home readings good, last hgba1c 7.6.   Reviewed home meds and discharge medications. Patient has been taking hydralazine and imdur in addition to coreg, Entresto and torsemide. He has been having some low BP readings using his wrist cuff in the upper 80s and 90s systolic.  He has had some lightheadedness with position changes.  He called his cardiology office who instructed him to hold his blood pressure meds until he was seen today.  Current Meds  Medication Sig   atorvastatin (LIPITOR) 40 MG tablet Take 1 tablet (40 mg total) by mouth daily at 6 PM.   carvedilol (COREG) 6.25 MG tablet Take 1 tablet (6.25 mg total) by mouth 2 (two) times daily with a meal.   DULoxetine (CYMBALTA) 20 MG capsule Take 2 capsules by mouth daily.   hydrALAZINE (APRESOLINE) 50 MG tablet Take 50 mg by mouth 3 (three) times daily.   isosorbide mononitrate (IMDUR) 30 MG 24 hr tablet Take 30 mg by mouth daily.   NOVOLIN 70/30 FLEXPEN (70-30) 100 UNIT/ML PEN Inject 25 Units into the skin 3 (three) times daily.    potassium chloride SA (KLOR-CON) 20 MEQ tablet  Take 1 tablet (20 mEq total) by mouth 2 (two) times daily.   rOPINIRole (REQUIP) 0.25 MG tablet Take 1 tablet by mouth 3 (three) times daily.   sacubitril-valsartan (ENTRESTO) 24-26 MG Take 1 tablet by mouth 2 (two) times daily.   torsemide (DEMADEX) 20 MG tablet Take 2 tablets (40 mg total) by mouth daily.     Past Medical History:  Diagnosis Date   Diabetes mellitus    ED (erectile dysfunction)    Hypertension    Past Surgical History:  Procedure Laterality Date   ANKLE SURGERY     both    back fusion     l4,l5   BACK SURGERY     LAPAROSCOPIC APPENDECTOMY N/A 03/28/2019   Procedure: APPENDECTOMY LAPAROSCOPIC;  Surgeon: Sung Amabile, DO;  Location: [facility] ORS;  Service: General;  Laterality: N/A;   NASAL SINUS SURGERY     Family History  Problem Relation Age of Onset   Hypertension Mother    Diabetes Father    Social History   Tobacco Use   Smoking status: Never Smoker   Smokeless tobacco: Never Used  Substance Use Topics   Alcohol use: No   Drug use: No      Review of Systems Per HPI    Objective:   Physical Exam Vitals signs reviewed.  Constitutional:      General: He is not in acute distress.    Appearance: Normal appearance. He  is normal weight. He is not ill-appearing, toxic-appearing or diaphoretic.  HENT:     Head: Normocephalic and atraumatic.  Eyes:     Conjunctiva/sclera: Conjunctivae normal.  Neck:     Musculoskeletal: Normal range of motion and neck supple.     Vascular: No JVD.  Cardiovascular:     Rate and Rhythm: Normal rate and regular rhythm.     Pulses: Normal pulses.     Heart sounds: Normal heart sounds.  Pulmonary:     Effort: Pulmonary effort is normal.     Breath sounds: Normal breath sounds.  Musculoskeletal:     Right lower leg: No edema.     Left lower leg: No edema.     Comments: Normal gait.  He is tender along his trapezius and paraspinal muscles.  Reports that palpation feels good.  Skin:    General: Skin  is warm and dry.  Neurological:     Mental Status: He is alert and oriented to person, place, and time.       BP 122/74    Pulse 84    Temp 97.7 F (36.5 C) (Temporal)    Wt 220 lb (99.8 kg)    SpO2 96%    BMI 28.63 kg/m  Wt Readings from Last 3 Encounters:  09/21/19 220 lb (99.8 kg)  09/19/19 217 lb 6.4 oz (98.6 kg)  09/10/19 234 lb (106.1 kg)   Orthostatic VS for the past 72 hrs (Last 3 readings):  Orthostatic BP Patient Position BP Location Cuff Size Orthostatic Pulse  09/21/19 1248 106/57 Standing -- -- 97  09/21/19 1247 (!) 86/66 Sitting -- -- 99  09/21/19 1246 102/77 Supine -- -- 89  09/21/19 1213 -- Sitting Left Arm Large --  09/21/19 1202 -- Sitting Right Arm Large --       Assessment & Plan:  1. Hospital discharge follow-up -Reviewed medications and instructions.  Answered all questions for patient and his wife.  2. Hypertensive heart disease with heart failure ([place]) -He has follow-up on file with the heart failure clinic as well and is general cardiology.  I have sent a message to [facility] who is the nurse practitioner will be seeing him later this week in the heart failure clinic. -Patient was given written and verbal instructions regarding medications, and pressure blood pressure monitoring - Blood Pressure Monitoring (BLOOD PRESSURE MONITOR/L CUFF) MISC; 1 Units by Does not apply route as needed.  Dispense: 1 each; Refill: 0 - Comprehensive metabolic panel - Brain natriuretic peptide  3. Acute left-sided low back pain without sciatica -No worrisome findings on history or physical exam.  Discussed symptomatic treatment including over-the-counter acetaminophen, heat, gentle range of motion, topical creams and patches. -Follow-up if worsening or if no improvement  -Follow-up in 3 months   Clarene Reamer, FNP-BC  [facility], [place]  09/21/2019 5:04 PM

## 2019-09-22 ENCOUNTER — Encounter: Payer: Self-pay | Admitting: Family Medicine

## 2019-09-22 DIAGNOSIS — I110 Hypertensive heart disease with heart failure: Secondary | ICD-10-CM

## 2019-09-22 DIAGNOSIS — N182 Chronic kidney disease, stage 2 (mild): Secondary | ICD-10-CM | POA: Diagnosis not present

## 2019-09-22 DIAGNOSIS — E785 Hyperlipidemia, unspecified: Secondary | ICD-10-CM | POA: Diagnosis not present

## 2019-09-22 DIAGNOSIS — I509 Heart failure, unspecified: Secondary | ICD-10-CM | POA: Diagnosis not present

## 2019-09-22 DIAGNOSIS — N529 Male erectile dysfunction, unspecified: Secondary | ICD-10-CM | POA: Diagnosis not present

## 2019-09-22 DIAGNOSIS — I130 Hypertensive heart and chronic kidney disease with heart failure and stage 1 through stage 4 chronic kidney disease, or unspecified chronic kidney disease: Secondary | ICD-10-CM | POA: Diagnosis not present

## 2019-09-22 DIAGNOSIS — Z794 Long term (current) use of insulin: Secondary | ICD-10-CM | POA: Diagnosis not present

## 2019-09-22 DIAGNOSIS — E1122 Type 2 diabetes mellitus with diabetic chronic kidney disease: Secondary | ICD-10-CM | POA: Diagnosis not present

## 2019-09-22 DIAGNOSIS — I5021 Acute systolic (congestive) heart failure: Secondary | ICD-10-CM | POA: Diagnosis not present

## 2019-09-22 NOTE — Telephone Encounter (Signed)
TCM- 1st attempt.  Attempted to call the patient. No answer- I left a message to please call back.  

## 2019-09-23 MED ORDER — BLOOD PRESSURE MONITOR/L CUFF MISC: 1.0000 [IU] | 0 refills | Status: DC | PRN

## 2019-09-23 NOTE — Telephone Encounter (Signed)
Patient called because he hasn't heard back about the rx.  I let patient know that [name]'s off on Tuesdays. He wasn't aware.

## 2019-09-23 NOTE — Progress Notes (Signed)
Patient ID: [name], male    DOB: 15-Dec-1975, 43 y.o.   MRN: [id number]  HPI  [name] is a 43 y/o male with a history of DM, HTN and chronic heart failure.   Echo report from 09/15/2019 reviewed and showed an EF of <20% along with mild Logan and trivial TR/PR.   Admitted 09/14/2019 due to acute heart failure. Cardiology consult obtained. Initially given IV lasix and then transitioned to oral diuretics. Discharged after 5 days. Was in the ED 09/07/2019 due to heart failure and chest pain where he was treated and released.   He presents today for his initial visit with a chief complaint of minimal fatigue upon moderate exertion. He describes this as having been present for several months. He has associated blurry vision and light-headedness along with this. He denies any difficulty sleeping, abdominal distention, palpitations, pedal edema, chest pain, shortness of breath, cough or weight gain.   He says that he took his isosorbide today as he forgot that he was supposed to stop it. Admits that he's been confused regarding his medications.   Past Medical History:  Diagnosis Date  . CHF (congestive heart failure) ([place])   . Diabetes mellitus   . ED (erectile dysfunction)   . Hypertension    Past Surgical History:  Procedure Laterality Date  . ANKLE SURGERY     both   . back fusion     l4,l5  . BACK SURGERY    . LAPAROSCOPIC APPENDECTOMY N/A 03/28/2019   Procedure: APPENDECTOMY LAPAROSCOPIC;  Surgeon: Benjamine Sprague, DO;  Location: [facility] ORS;  Service: General;  Laterality: N/A;  . NASAL SINUS SURGERY     Family History  Problem Relation Age of Onset  . Hypertension Mother   . Diabetes Father    Social History   Tobacco Use  . Smoking status: Never Smoker  . Smokeless tobacco: Never Used  Substance Use Topics  . Alcohol use: No   Allergies  Allergen Reactions  . Food     Chicken, eggs and peaches   Prior to Admission medications   Medication Sig Start Date End Date  Taking? Authorizing Provider  atorvastatin (LIPITOR) 40 MG tablet Take 1 tablet (40 mg total) by mouth daily at 6 PM. 09/16/19  Yes Mayo, Pete Pelt, MD  Blood Pressure Monitoring (BLOOD PRESSURE MONITOR/L CUFF) MISC 1 Units by Does not apply route as needed. 09/23/19  Yes Elby Beck, FNP  carvedilol (COREG) 6.25 MG tablet Take 1 tablet (6.25 mg total) by mouth 2 (two) times daily with a meal. 09/16/19  Yes Mayo, Pete Pelt, MD  DULoxetine (CYMBALTA) 20 MG capsule Take 2 capsules by mouth daily. 03/03/19  Yes [provider]  ENTRESTO 49-51 MG Take 1 tablet by mouth 2 (two) times daily. 09/19/19  Yes [provider]  NOVOLIN 70/30 FLEXPEN (70-30) 100 UNIT/ML PEN Inject 25 Units into the skin 3 (three) times daily.  04/13/19  Yes [provider]  potassium chloride SA (KLOR-CON) 20 MEQ tablet Take 1 tablet (20 mEq total) by mouth 2 (two) times daily. 09/17/19  Yes Mayo, Pete Pelt, MD  rOPINIRole (REQUIP) 0.25 MG tablet Take 1 tablet by mouth 3 (three) times daily. 06/08/19  Yes [provider]  torsemide (DEMADEX) 20 MG tablet Take 2 tablets (40 mg total) by mouth daily. 09/17/19  Yes Mayo, Pete Pelt, MD    Review of Systems  Constitutional: Positive for fatigue. Negative for appetite change.  HENT: Negative for congestion,  postnasal drip and sore throat.   Eyes: Positive for visual disturbance (blurry vision at times).  Respiratory: Negative for cough and shortness of breath.   Cardiovascular: Negative for chest pain, palpitations and leg swelling.  Gastrointestinal: Negative for abdominal distention and abdominal pain.  Endocrine: Negative.   Genitourinary: Negative.   Musculoskeletal: Negative for back pain and neck pain.  Skin: Negative.   Allergic/Immunologic: Negative.   Neurological: Positive for light-headedness (at times). Negative for dizziness.  Hematological: Negative for adenopathy. Does not bruise/bleed easily.  Psychiatric/Behavioral:  Negative for dysphoric mood and sleep disturbance. The patient is not nervous/anxious.     Vitals:   09/24/19 1022  BP: 94/71  Pulse: 92  Resp: 20  SpO2: 100%  Weight: 220 lb 3.2 oz (99.9 kg)  Height: 6\' 1"  (1.854 m)   Wt Readings from Last 3 Encounters:  09/24/19 220 lb 3.2 oz (99.9 kg)  09/21/19 220 lb (99.8 kg)  09/19/19 217 lb 6.4 oz (98.6 kg)   Lab Results  Component Value Date   CREATININE 1.75 (H) 09/21/2019   CREATININE 1.75 (H) 09/19/2019   CREATININE 1.59 (H) 09/18/2019    Physical Exam Vitals signs and nursing note reviewed.  Constitutional:      Appearance: Normal appearance.  HENT:     Head: Normocephalic and atraumatic.  Neck:     Musculoskeletal: Normal range of motion and neck supple.  Cardiovascular:     Rate and Rhythm: Normal rate and regular rhythm.  Pulmonary:     Effort: Pulmonary effort is normal. No respiratory distress.     Breath sounds: No wheezing or rales.  Abdominal:     General: Abdomen is flat. There is no distension.     Palpations: Abdomen is soft.  Musculoskeletal:        General: No tenderness.     Right lower leg: No edema.     Left lower leg: No edema.  Skin:    General: Skin is warm and dry.  Neurological:     General: No focal deficit present.     Mental Status: He is alert and oriented to person, place, and time.  Psychiatric:        Mood and Affect: Mood normal.        Behavior: Behavior normal.        Thought Content: Thought content normal.    Assessment & Plan:  1: Chronic heart failure with reduced ejection fraction- - NYHA class II - euvolemic today - weighing daily and says that his weight has been stable; reminded to call for an overnight weight gain of >2 pounds or a weekly weight gain of >5 pounds - not adding salt and has been trying to read food labels for sodium content; written dietary information was provided to him about this - saw cardiology (Agbor-Etang) 09/10/2019 and returns 09/28/2019 - due to  hypotension/ dizziness will decrease his entresto in 1/2. He is currently taking entresto 49/51mg  BID and he was instructed to take 1/2 tablet twice daily for now - BNP 09/15/2019 was 1004.0  2: HTN- - BP low today - reinforced that he needed to stop the isosorbide; he is trying to get an arm BP cuff as he's currently using a wrist cuff - saw PCP Leone Payor) 09/21/2019 - BMP 09/21/2019 reviewed and showed sodium 140, potassium 3.6, creatinine 1.75 and GFR 51.49  3: Type 1 diabetes- - saw endocrinologist Rosaura Carpenter) 09/03/2019 - A1c 09/15/2019 was 7.5% - glucose this morning after eating was 89 so  he hasn't taken his insulin yet today  Medication bottles were reviewed.   Return here in 1 month or sooner for any questions/problems before then

## 2019-09-24 ENCOUNTER — Other Ambulatory Visit: Payer: Self-pay

## 2019-09-24 ENCOUNTER — Encounter: Payer: Self-pay | Admitting: Family

## 2019-09-24 ENCOUNTER — Ambulatory Visit: Payer: Medicare Other | Attending: Family | Admitting: Family

## 2019-09-24 VITALS — BP 94/71 | HR 92 | Resp 20 | Ht 73.0 in | Wt 220.2 lb

## 2019-09-24 DIAGNOSIS — R5383 Other fatigue: Secondary | ICD-10-CM | POA: Diagnosis not present

## 2019-09-24 DIAGNOSIS — Z794 Long term (current) use of insulin: Secondary | ICD-10-CM | POA: Insufficient documentation

## 2019-09-24 DIAGNOSIS — Z79899 Other long term (current) drug therapy: Secondary | ICD-10-CM | POA: Diagnosis not present

## 2019-09-24 DIAGNOSIS — E119 Type 2 diabetes mellitus without complications: Secondary | ICD-10-CM | POA: Diagnosis not present

## 2019-09-24 DIAGNOSIS — Z8249 Family history of ischemic heart disease and other diseases of the circulatory system: Secondary | ICD-10-CM | POA: Diagnosis not present

## 2019-09-24 DIAGNOSIS — I5022 Chronic systolic (congestive) heart failure: Secondary | ICD-10-CM

## 2019-09-24 DIAGNOSIS — H538 Other visual disturbances: Secondary | ICD-10-CM | POA: Insufficient documentation

## 2019-09-24 DIAGNOSIS — I110 Hypertensive heart disease with heart failure: Secondary | ICD-10-CM | POA: Insufficient documentation

## 2019-09-24 DIAGNOSIS — E109 Type 1 diabetes mellitus without complications: Secondary | ICD-10-CM

## 2019-09-24 DIAGNOSIS — I10 Essential (primary) hypertension: Secondary | ICD-10-CM

## 2019-09-24 NOTE — Patient Instructions (Addendum)
Continue weighing daily and call for an overnight weight gain of > 2 pounds or a weekly weight gain of >5 pounds.  Decrease entresto to 1/2 tablet twice daily

## 2019-09-25 ENCOUNTER — Other Ambulatory Visit: Payer: Medicare Other

## 2019-09-28 ENCOUNTER — Encounter: Payer: Self-pay | Admitting: Cardiology

## 2019-09-28 ENCOUNTER — Other Ambulatory Visit: Payer: Self-pay | Admitting: Family Medicine

## 2019-09-28 ENCOUNTER — Ambulatory Visit (INDEPENDENT_AMBULATORY_CARE_PROVIDER_SITE_OTHER): Payer: Medicare Other | Admitting: Cardiology

## 2019-09-28 ENCOUNTER — Ambulatory Visit: Payer: Medicare Other | Admitting: Cardiology

## 2019-09-28 ENCOUNTER — Other Ambulatory Visit: Payer: Self-pay

## 2019-09-28 ENCOUNTER — Telehealth: Payer: Self-pay

## 2019-09-28 VITALS — BP 100/80 | HR 88 | Temp 97.0°F | Ht 73.75 in | Wt 217.8 lb

## 2019-09-28 DIAGNOSIS — N529 Male erectile dysfunction, unspecified: Secondary | ICD-10-CM | POA: Diagnosis not present

## 2019-09-28 DIAGNOSIS — I10 Essential (primary) hypertension: Secondary | ICD-10-CM

## 2019-09-28 DIAGNOSIS — N289 Disorder of kidney and ureter, unspecified: Secondary | ICD-10-CM | POA: Diagnosis not present

## 2019-09-28 DIAGNOSIS — I5022 Chronic systolic (congestive) heart failure: Secondary | ICD-10-CM

## 2019-09-28 DIAGNOSIS — N182 Chronic kidney disease, stage 2 (mild): Secondary | ICD-10-CM | POA: Diagnosis not present

## 2019-09-28 DIAGNOSIS — I130 Hypertensive heart and chronic kidney disease with heart failure and stage 1 through stage 4 chronic kidney disease, or unspecified chronic kidney disease: Secondary | ICD-10-CM | POA: Diagnosis not present

## 2019-09-28 DIAGNOSIS — E1122 Type 2 diabetes mellitus with diabetic chronic kidney disease: Secondary | ICD-10-CM | POA: Diagnosis not present

## 2019-09-28 DIAGNOSIS — I5020 Unspecified systolic (congestive) heart failure: Secondary | ICD-10-CM

## 2019-09-28 DIAGNOSIS — I110 Hypertensive heart disease with heart failure: Secondary | ICD-10-CM

## 2019-09-28 DIAGNOSIS — E785 Hyperlipidemia, unspecified: Secondary | ICD-10-CM | POA: Diagnosis not present

## 2019-09-28 DIAGNOSIS — I420 Dilated cardiomyopathy: Secondary | ICD-10-CM

## 2019-09-28 DIAGNOSIS — I5021 Acute systolic (congestive) heart failure: Secondary | ICD-10-CM | POA: Diagnosis not present

## 2019-09-28 NOTE — Telephone Encounter (Signed)
[name] with [facility] left v/m that a HH eval was done and at this time pt does not need any therapy. Mickel Baas said pt and/or pts family is requesting a shower seat and transport w/c. If PCP is in agreement fax order to [facility] fax # [phone] and Mickel Baas will send to vendor and work out through insurance. Please advise.

## 2019-09-28 NOTE — Progress Notes (Signed)
Virtual Telehealth Call HF Patient  2nd Follow-up call- Participant discharged from hospital on Friday 09/18/19. Patient reports feeling very weak and drained. Patient feels like he has not fully recuperated yet. Patient is not short of breath, patient reports no swelling. Patient has little energy. Patient had follow-up with Cardiologist today and had another low blood pressure while he was there. Cardiologist advised patient to keep checking BP at home and report the numbers back. Patient was advised to continue taking medications and follow-up in 3 weeks with bloodwork. Patient has an in home RN appointment once per week.    Daily Weights/Zone Magnet- Patient is checking his weight daily and reports that his weight has stayed the same.    Fluid Restriction- Patient is staying within his fluid restriction of 64 oz.  Exercise/Physical Activity Level- Patient reports that he is not ready for exercise and too wiped out to walk yet. Patient has a place to walk in his neighborhood around the block when he has stabilized on his meds and is feeling a little better. Patient will need to start very slow with set work of 3 -5 minutes at a time.   Medication Access and Compliance- Patient reports that he is able to access his medications and is taking them as prescribed. Patient does need a blood pressure cuff for home. This EP is finding a resource to provide a BP cuff for participant. Program funds are available for use.   Other- Patient is following a healthy diet and had a low blood sugar this morning (74 mg/dL). Patient verbalizes understanding of what to do when a low blood sugar reading occurs. Patient drank 1/2 glass of orange juice. Patient felt a little shaky during the time he was having low blood sugar. Patient felt weak and tired after his low blood sugar was resolved.  Next step- Order BP Cuff for participant, design home exercise program, communicate through [employer] App. Call 3 next  Monday at [place]  Exercise Physiologist Department Phone #: [phone] Fax: [phone]  Email Address: [email or web address]

## 2019-09-28 NOTE — Telephone Encounter (Signed)
Prescriptions printed and given to Varney Daily, [place] for faxing.

## 2019-09-28 NOTE — Progress Notes (Signed)
Cardiology Office Note:    Date:  09/28/2019   ID:  Logan RowanRonald J Sidman, DOB 10/15/1976, MRN [id number]  PCP:  Emi BelfastGessner, Deborah B, FNP  Cardiologist:  Debbe OdeaBrian Agbor-Etang, MD  Electrophysiologist:  None   Referring MD: Emi BelfastGessner, Deborah B, FNP   Chief Complaint  Patient presents with  . office visit    [facility] with patient.    History of Present Illness:    [name] is a 43 y.o. male with a hx of hypertension, diabetes, hyperlipidemia, NICM EF <20 who presents for follow-up.  Patient was originally seen due to shortness of breath on exertion.  Echocardiogram was ordered but since his clinic visit, he presented to the emergency room due to worsening shortness of breath.  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  He was discharged on carvedilol 6.25 twice daily, spironolactone, Imdur, Entresto 50, torsemide 40 daily.  He was seen last week in the heart failure clinic and his Sherryll Burgerntresto was decreased to 50 mg due to low blood pressures.  Imdur was and spironolactone were also stopped.  He states his blood pressure runs low at about 80 systolic   Past Medical History:  Diagnosis Date  . CHF (congestive heart failure) (H[facility])   . Diabetes mellitus   . ED (erectile dysfunction)   . Hypertension     Past Surgical History:  Procedure Laterality Date  . ANKLE SURGERY     both   . back fusion     l4,l5  . BACK SURGERY    . LAPAROSCOPIC APPENDECTOMY N/A 03/28/2019   Procedure: APPENDECTOMY LAPAROSCOPIC;  Surgeon: Sung AmabileSakai, Isami, DO;  Location: [facility] ORS;  Service: General;  Laterality: N/A;  . NASAL SINUS SURGERY      Current Medications: Current Meds  Medication Sig  . atorvastatin (LIPITOR) 40 MG tablet Take 1 tablet (40 mg total) by mouth daily at 6 PM.  . Blood Pressure Monitoring (BLOOD PRESSURE MONITOR/L CUFF) MISC 1 Units by Does not apply route as needed.  . carvedilol (COREG)  6.25 MG tablet Take 1 tablet (6.25 mg total) by mouth 2 (two) times daily with a meal.  . DULoxetine (CYMBALTA) 20 MG capsule Take 2 capsules by mouth daily.  Marland Kitchen. ENTRESTO 49-51 MG Take 0.5 tablets by mouth 2 (two) times daily.   Marland Kitchen. NOVOLIN 70/30 FLEXPEN (70-30) 100 UNIT/ML PEN Inject 25 Units into the skin 3 (three) times daily.   . potassium chloride SA (KLOR-CON) 20 MEQ tablet Take 1 tablet (20 mEq total) by mouth 2 (two) times daily.  Marland Kitchen. rOPINIRole (REQUIP) 0.25 MG tablet Take 1 tablet by mouth 3 (three) times daily.  Marland Kitchen. torsemide (DEMADEX) 20 MG tablet Take 2 tablets (40 mg total) by mouth daily.     Allergies:   Food   Social History   Socioeconomic History  . Marital status: Married    Spouse name: Not on file  . Number of children: Not on file  . Years of education: Not on file  . Highest education level: Not on file  Occupational History  . Occupation: unemployed  Social Needs  . Financial resource strain: Not hard at all  . Food insecurity    Worry: Never true    Inability: Never true  . Transportation needs    Medical: Not on file    Non-medical: No  Tobacco Use  . Smoking status: Never Smoker  . Smokeless tobacco: Never Used  Substance and  Sexual Activity  . Alcohol use: No  . Drug use: No  . Sexual activity: Yes    Birth control/protection: None  Lifestyle  . Physical activity    Days per week: 4 days    Minutes per session: Not on file  . Stress: Not at all  Relationships  . Social connections    Talks on phone: More than three times a week    Gets together: More than three times a week    Attends religious service: 1 to 4 times per year    Active member of club or organization: No    Attends meetings of clubs or organizations: Never    Relationship status: Married  Other Topics Concern  . Not on file  Social History Narrative  . Not on file     Family History: The patient's family history includes Diabetes in his father; Hypertension in his mother.   ROS:   Please see the history of present illness.     All other systems reviewed and are negative.  EKGs/Labs/Other Studies Reviewed:    The following studies were reviewed today: TTE October 02, 2019 1. Left ventricular ejection fraction, by visual estimation, is <20%. The left ventricle has severely decreased function. Mildly increased left ventricular size. There is mildly increased left ventricular hypertrophy.  2. Elevated mean left atrial pressure.  3. Left ventricular diastolic Doppler parameters are consistent with restrictive filling pattern of LV diastolic filling.  4. Global right ventricle has moderately reduced systolic function.The right ventricular size is normal. No increase in right ventricular wall thickness.  5. Left atrial size was moderately dilated.  6. Right atrial size was mildly dilated.  7. The mitral valve is normal in structure. Mild mitral valve regurgitation.  8. The tricuspid valve is not well visualized. Tricuspid valve regurgitation is trivial.  9. The aortic valve is tricuspid Aortic valve regurgitation was not visualized by color flow Doppler. Structurally normal aortic valve, with no evidence of sclerosis or stenosis. 10. The pulmonic valve was grossly normal. Pulmonic valve regurgitation is trivial by color flow Doppler. 11. TR signal is inadequate for assessing pulmonary artery systolic pressure. 12. The inferior vena cava is normal in size with <50% respiratory variability, suggesting right atrial pressure of 8 mmHg. 13. The interatrial septum was not well visualized.  pharm myocardial perfusion imaging stress test  T wave inversion was noted during stress in the II, III, aVF, V5 and V6 leads.  This is an intermediate risk study.  The left ventricular ejection fraction is severely decreased (<30%). Correlate with other modality such as Echocardiogram  There was a very small partially reversible apical defect. This is likely due to apical thining or  artifact  No evidence of ischemia was otherwise noted on this study  EKG:  EKG is  ordered today.  The ekg ordered today demonstrates normal sinus rhythm, possible RA enlargement, T wave inversions in the inferior lateral leads.  Recent Labs: 04/02/2019: Magnesium 1.9 02-Oct-2019: B Natriuretic Peptide 1,004.0; Hemoglobin 13.9; Platelets 166; TSH 1.207 09/21/2019: ALT 27; BUN 38; Creatinine, Ser 1.75; Potassium 3.6; Pro B Natriuretic peptide (BNP) 62.0; Sodium 140  Recent Lipid Panel    Component Value Date/Time   CHOL 118 10/02/2019 0533   TRIG 49 10/02/19 0533   HDL 48 10-02-2019 0533   CHOLHDL 2.5 10/02/2019 0533   VLDL 10 Oct 02, 2019 0533   LDLCALC 60 2019/10/02 0533    Physical Exam:    VS:  BP 100/80 (BP Location: Left Arm, Patient  Position: Sitting, Cuff Size: Normal)   Pulse 88   Temp (!) 97 F (36.1 C)   Ht 6' 1.75" (1.873 m)   Wt 217 lb 12 oz (98.8 kg)   SpO2 98%   BMI 28.15 kg/m     Wt Readings from Last 3 Encounters:  09/28/19 217 lb 12 oz (98.8 kg)  09/24/19 220 lb 3.2 oz (99.9 kg)  09/21/19 220 lb (99.8 kg)     GEN:  Well nourished, well developed in no acute distress HEENT: Normal NECK: No JVD; No carotid bruits LYMPHATICS: No lymphadenopathy CARDIAC: RRR, no murmurs, rubs, gallops RESPIRATORY:  Clear to auscultation without rales, wheezing or rhonchi  ABDOMEN: Soft, non-tender, non-distended MUSCULOSKELETAL:  No edema; No deformity  SKIN: Warm and dry NEUROLOGIC:  Alert and oriented x 3 PSYCHIATRIC:  Normal affect   ASSESSMENT:   Patient with nonischemic cardiomyopathy, 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  Ejection fraction of less than 20%.  He appears euvolemic on exam.  Blood pressures on the low side of normal which is preventing up titration of heart failure medications. 1. HFrEF (heart failure with reduced ejection fraction) (HCC)   2. Essential hypertension   3. Renal insufficiency    PLAN:      1. Continue Entresto at 50 mg twice  daily, Coreg 6.25 mg twice daily.  Torsemide 40, KCl 40 daily. 2. Blood pressure is normal.  Continue Coreg and Entresto as above. 3. Chronic CKD.  Last creatinine stable.  Plan to repeat BMP in about 2 to 3 weeks.  Follow-up in 6 weeks  Total encounter time more than 40 minutes  Greater than 50% was spent in counseling and coordination of care with the patient   Medication Adjustments/Labs and Tests Ordered: Current medicines are reviewed at length with the patient today.  Concerns regarding medicines are outlined above.  Orders Placed This Encounter  Procedures  . Basic Metabolic Panel (BMET)  . EKG 12-Lead   No orders of the defined types were placed in this encounter.   Patient Instructions  Medication Instructions:  Your physician recommends that you continue on your current medications as directed. Please refer to the Current Medication list given to you today.  *If you need a refill on your cardiac medications before your next appointment, please call your pharmacy*  Lab Work: Your physician recommends that you return for lab work in: 3 weeks on November 30th, 2020. - Will check BMET.  - Please go to the [facility]. You will check in at the front desk to the right as you walk into the atrium. Valet Parking is offered if needed. - No appointment needed. You may go any day between 7 am and 6 pm.  If you have labs (blood work) drawn today and your tests are completely normal, you will receive your results only by: Marland Kitchen MyChart Message (if you have MyChart) OR . A paper copy in the mail If you have any lab test that is abnormal or we need to change your treatment, we will call you to review the results.  Testing/Procedures: none  Follow-Up: At [facility], you and your health needs are our priority.  As part of our continuing mission to provide you with exceptional heart care, we have created designated Provider Care Teams.  These Care Teams include your primary  Cardiologist (physician) and Advanced Practice Providers (APPs -  Physician Assistants and Nurse Practitioners) who all work together to provide you with the care you need, when you need  it.  Your next appointment:   6 weeks.   The format for your next appointment:   In Person  Provider:    You may see Debbe Odea, MD or one of the following Advanced Practice Providers on your designated Care Team:    Nicolasa Ducking, NP  Eula Listen, PA-C  Marisue Ivan, PA-C      Signed, Debbe Odea, MD  09/28/2019 8:51 AM    [facility]

## 2019-09-28 NOTE — Patient Instructions (Signed)
Medication Instructions:  Your physician recommends that you continue on your current medications as directed. Please refer to the Current Medication list given to you today.  *If you need a refill on your cardiac medications before your next appointment, please call your pharmacy*  Lab Work: Your physician recommends that you return for lab work in: 3 weeks on November 30th, 2020. - Will check BMET.  - Please go to the [facility]. You will check in at the front desk to the right as you walk into the atrium. Valet Parking is offered if needed. - No appointment needed. You may go any day between 7 am and 6 pm.  If you have labs (blood work) drawn today and your tests are completely normal, you will receive your results only by: Marland Kitchen MyChart Message (if you have MyChart) OR . A paper copy in the mail If you have any lab test that is abnormal or we need to change your treatment, we will call you to review the results.  Testing/Procedures: none  Follow-Up: At [facility], you and your health needs are our priority.  As part of our continuing mission to provide you with exceptional heart care, we have created designated Provider Care Teams.  These Care Teams include your primary Cardiologist (physician) and Advanced Practice Providers (APPs -  Physician Assistants and Nurse Practitioners) who all work together to provide you with the care you need, when you need it.  Your next appointment:   6 weeks.   The format for your next appointment:   In Person  Provider:    You may see Kate Sable, MD or one of the following Advanced Practice Providers on your designated Care Team:    Murray Hodgkins, NP  Christell Faith, PA-C  Marrianne Mood, PA-C

## 2019-09-29 NOTE — Telephone Encounter (Signed)
Faxes sent back to [facility] at [phone] Will await confirmation.

## 2019-09-30 DIAGNOSIS — I130 Hypertensive heart and chronic kidney disease with heart failure and stage 1 through stage 4 chronic kidney disease, or unspecified chronic kidney disease: Secondary | ICD-10-CM | POA: Diagnosis not present

## 2019-09-30 DIAGNOSIS — N182 Chronic kidney disease, stage 2 (mild): Secondary | ICD-10-CM | POA: Diagnosis not present

## 2019-09-30 DIAGNOSIS — N529 Male erectile dysfunction, unspecified: Secondary | ICD-10-CM | POA: Diagnosis not present

## 2019-09-30 DIAGNOSIS — E785 Hyperlipidemia, unspecified: Secondary | ICD-10-CM | POA: Diagnosis not present

## 2019-09-30 DIAGNOSIS — I5021 Acute systolic (congestive) heart failure: Secondary | ICD-10-CM | POA: Diagnosis not present

## 2019-09-30 DIAGNOSIS — E1122 Type 2 diabetes mellitus with diabetic chronic kidney disease: Secondary | ICD-10-CM | POA: Diagnosis not present

## 2019-09-30 NOTE — Telephone Encounter (Signed)
Confirmation received. Orders placed to scan. Nothing further needed.

## 2019-09-30 NOTE — Telephone Encounter (Signed)
The patient was seen by Dr. Garen Lah on 09/28/19.

## 2019-10-05 ENCOUNTER — Other Ambulatory Visit: Payer: Self-pay

## 2019-10-05 DIAGNOSIS — I5022 Chronic systolic (congestive) heart failure: Secondary | ICD-10-CM

## 2019-10-05 NOTE — Progress Notes (Signed)
Cardiac Individual Treatment Plan  Patient Details  Name: [name] MRN: [id number] Date of Birth: 09-05-1976 Referring Provider:     Cardiac Rehab from 10/05/2019 in [facility] Cardiac and Pulmonary Rehab  Referring Provider  [name]  (Pended)       Initial Encounter Date:    Cardiac Rehab from 10/05/2019 in [facility] [place] Cardiac and Pulmonary Rehab  Date  10/05/19  (Pended)       Visit Diagnosis: Heart failure, chronic systolic ([place])  Patient's Home Medications on Admission:  Current Outpatient Medications:  .  atorvastatin (LIPITOR) 40 MG tablet, Take 1 tablet (40 mg total) by mouth daily at 6 PM., Disp: 30 tablet, Rfl: 0 .  Blood Pressure Monitoring (BLOOD PRESSURE MONITOR/L CUFF) MISC, 1 Units by Does not apply route as needed., Disp: 1 each, Rfl: 0 .  carvedilol (COREG) 6.25 MG tablet, Take 1 tablet (6.25 mg total) by mouth 2 (two) times daily with a meal., Disp: 60 tablet, Rfl: 0 .  DULoxetine (CYMBALTA) 20 MG capsule, Take 2 capsules by mouth daily., Disp: , Rfl:  .  ENTRESTO 49-51 MG, Take 0.5 tablets by mouth 2 (two) times daily. , Disp: , Rfl:  .  NOVOLIN 70/30 FLEXPEN (70-30) 100 UNIT/ML PEN, Inject 25 Units into the skin 3 (three) times daily. , Disp: , Rfl:  .  potassium chloride SA (KLOR-CON) 20 MEQ tablet, Take 1 tablet (20 mEq total) by mouth 2 (two) times daily., Disp: 30 tablet, Rfl: 0 .  rOPINIRole (REQUIP) 0.25 MG tablet, Take 1 tablet by mouth 3 (three) times daily., Disp: , Rfl:  .  torsemide (DEMADEX) 20 MG tablet, Take 2 tablets (40 mg total) by mouth daily., Disp: 60 tablet, Rfl: 0  Past Medical History: Past Medical History:  Diagnosis Date  . CHF (congestive heart failure) ([place])   . Diabetes mellitus   . ED (erectile dysfunction)   . Hypertension     Tobacco Use: Social History   Tobacco Use  Smoking Status Never Smoker  Smokeless Tobacco Never Used    Labs: Recent Review Flowsheet Data    Labs for ITP Cardiac and Pulmonary Rehab Latest  Ref Rng & Units 04/25/2011 02/09/2012 05/26/2018 09/14/2019 09/15/2019   Cholestrol 0 - 200 mg/dL - - 177 - 118   LDLCALC 0 - 99 mg/dL - - 104(H) - 60   HDL >40 mg/dL - - 57.00 - 48   Trlycerides <150 mg/dL - - 81.0 - 49   Hemoglobin A1c 4.8 - 5.6 % - - 13.4(A) 7.4(H) 7.5(H)   TCO2 0 - 100 mmol/L 30 29 - - -       Exercise Target Goals: Exercise Program Goal: Individual exercise prescription set using results from initial 6 min walk test and THRR while considering  patient's activity barriers and safety.   Exercise Prescription Goal: Initial exercise prescription builds to 30-45 minutes a day of aerobic activity, 2-3 days per week.  Home exercise guidelines will be given to patient during program as part of exercise prescription that the participant will acknowledge.  Activity Barriers & Risk Stratification:   6 Minute Walk:   Oxygen Initial Assessment:   Oxygen Re-Evaluation:   Oxygen Discharge (Final Oxygen Re-Evaluation):   Initial Exercise Prescription: Initial Exercise Prescription - 10/05/19 1900      Date of Initial Exercise RX and Referring Provider   Date  10/05/19  Roni Bread)     Referring Provider  [name]  (Pended)       Track  Laps  --  (Pended)    walking in neighborhood   Minutes  10  (Pended)       Prescription Details   Frequency (times per week)  3  (Pended)     Duration  Progress to 10 minutes continuous walking  at current work load and total walking time to 30-45 min  (Pended)       Intensity   THRR 40-80% of Max Heartrate  100-120  (Pended)     Ratings of Perceived Exertion  11-13  (Pended)     Perceived Dyspnea  0-4  (Pended)       Progression   Progression  Continue progressive overload as per policy without signs/symptoms or physical distress.  (Pended)       Resistance Training   Training Prescription  Yes  (Pended)        Perform Capillary Blood Glucose checks as needed.  Exercise Prescription Changes:   Exercise  Comments:   Exercise Goals and Review:   Exercise Goals Re-Evaluation :   Discharge Exercise Prescription (Final Exercise Prescription Changes):   Nutrition:  Target Goals: Understanding of nutrition guidelines, daily intake of sodium <1519m, cholesterol <2064m calories 30% from fat and 7% or less from saturated fats, daily to have 5 or more servings of fruits and vegetables.  Biometrics:    Nutrition Therapy Plan and Nutrition Goals:   Nutrition Assessments:   Nutrition Goals Re-Evaluation:   Nutrition Goals Discharge (Final Nutrition Goals Re-Evaluation):   Psychosocial: Target Goals: Acknowledge presence or absence of significant depression and/or stress, maximize coping skills, provide positive support system. Participant is able to verbalize types and ability to use techniques and skills needed for reducing stress and depression.   Initial Review & Psychosocial Screening:   Quality of Life Scores:   Scores of 19 and below usually indicate a poorer quality of life in these areas.  A difference of  2-3 points is a clinically meaningful difference.  A difference of 2-3 points in the total score of the Quality of Life Index has been associated with significant improvement in overall quality of life, self-image, physical symptoms, and general health in studies assessing change in quality of life.  PHQ-9: Recent Review Flowsheet Data    Dep[facility] 05/26/2018 04/26/2014   Decreased Interest 0 0   Down, Depressed, Hopeless 0 0   PHQ - 2 Score 0 0     Interpretation of Total Score  Total Score Depression Severity:  1-4 = Minimal depression, 5-9 = Mild depression, 10-14 = Moderate depression, 15-19 = Moderately severe depression, 20-27 = Severe depression   Psychosocial Evaluation and Intervention:   Psychosocial Re-Evaluation:   Psychosocial Discharge (Final Psychosocial Re-Evaluation):   Vocational Rehabilitation: Provide vocational rehab  assistance to qualifying candidates.   Vocational Rehab Evaluation & Intervention:   Education: Education Goals: Education classes will be provided on a variety of topics geared toward better understanding of heart health and risk factor modification. Participant will state understanding/return demonstration of topics presented as noted by education test scores.  Learning Barriers/Preferences:   Education Topics:  AED/CPR: - Group verbal and written instruction with the use of models to demonstrate the basic use of the AED with the basic ABC's of resuscitation.   General Nutrition Guidelines/Fats and Fiber: -Group instruction provided by verbal, written material, models and posters to present the general guidelines for heart healthy nutrition. Gives an explanation and review of dietary fats and fiber.   Controlling Sodium/Reading Food Labels: -  Group verbal and written material supporting the discussion of sodium use in heart healthy nutrition. Review and explanation with models, verbal and written materials for utilization of the food label.   Exercise Physiology & General Exercise Guidelines: - Group verbal and written instruction with models to review the exercise physiology of the cardiovascular system and associated critical values. Provides general exercise guidelines with specific guidelines to those with heart or lung disease.    Aerobic Exercise & Resistance Training: - Gives group verbal and written instruction on the various components of exercise. Focuses on aerobic and resistive training programs and the benefits of this training and how to safely progress through these programs..   Flexibility, Balance, Mind/Body Relaxation: Provides group verbal/written instruction on the benefits of flexibility and balance training, including mind/body exercise modes such as yoga, pilates and tai chi.  Demonstration and skill practice provided.   Stress and Anxiety: - Provides  group verbal and written instruction about the health risks of elevated stress and causes of high stress.  Discuss the correlation between heart/lung disease and anxiety and treatment options. Review healthy ways to manage with stress and anxiety.   Depression: - Provides group verbal and written instruction on the correlation between heart/lung disease and depressed mood, treatment options, and the stigmas associated with seeking treatment.   Anatomy & Physiology of the Heart: - Group verbal and written instruction and models provide basic cardiac anatomy and physiology, with the coronary electrical and arterial systems. Review of Valvular disease and Heart Failure   Cardiac Procedures: - Group verbal and written instruction to review commonly prescribed medications for heart disease. Reviews the medication, class of the drug, and side effects. Includes the steps to properly store meds and maintain the prescription regimen. (beta blockers and nitrates)   Cardiac Medications I: - Group verbal and written instruction to review commonly prescribed medications for heart disease. Reviews the medication, class of the drug, and side effects. Includes the steps to properly store meds and maintain the prescription regimen.   Cardiac Medications II: -Group verbal and written instruction to review commonly prescribed medications for heart disease. Reviews the medication, class of the drug, and side effects. (all other drug classes)    Go Sex-Intimacy & Heart Disease, Get SMART - Goal Setting: - Group verbal and written instruction through game format to discuss heart disease and the return to sexual intimacy. Provides group verbal and written material to discuss and apply goal setting through the application of the S.M.A.R.T. Method.   Other Matters of the Heart: - Provides group verbal, written materials and models to describe Stable Angina and Peripheral Artery. Includes description of the  disease process and treatment options available to the cardiac patient.   Exercise & Equipment Safety: - Individual verbal instruction and demonstration of equipment use and safety with use of the equipment.   Infection Prevention: - Provides verbal and written material to individual with discussion of infection control including proper hand washing and proper equipment cleaning during exercise session.   Falls Prevention: - Provides verbal and written material to individual with discussion of falls prevention and safety.   Diabetes: - Individual verbal and written instruction to review signs/symptoms of diabetes, desired ranges of glucose level fasting, after meals and with exercise. Acknowledge that pre and post exercise glucose checks will be done for 3 sessions at entry of program.   Know Your Numbers and Risk Factors: -Group verbal and written instruction about important numbers in your health.  Discussion of what  are risk factors and how they play a role in the disease process.  Review of Cholesterol, Blood Pressure, Diabetes, and BMI and the role they play in your overall health.   Sleep Hygiene: -Provides group verbal and written instruction about how sleep can affect your health.  Define sleep hygiene, discuss sleep cycles and impact of sleep habits. Review good sleep hygiene tips.    Other: -Provides group and verbal instruction on various topics (see comments)   Knowledge Questionnaire Score:   Core Components/Risk Factors/Patient Goals at Admission:   Core Components/Risk Factors/Patient Goals Review:    Core Components/Risk Factors/Patient Goals at Discharge (Final Review):    ITP Comments: ITP Comments    Row Name 10/05/19 1916           ITP Comments  Participant is ready to begin home exercise. Reviewed home exercise plan, signs and symptoms, RPE scale, Dyspnea scale, target heart rate range, and Better Hearts app.          Comments: Virtual HF  patient who is in 6 week waiting period to join Cardiac Rehab program in person. This ITP is to review his home exercise plan.

## 2019-10-05 NOTE — Progress Notes (Signed)
Teacher, English as a foreign language Call HF Patient  Follow-up-  Participant reports feeling better than last week. Participant had one episode of tightness in his chest yesterday which was relieved with rest and taking Entresto. Participant reports that he was given advice to take an extra dosage if needed by his Cardiologist. This EP reminded participant to report any symptoms to his Cardiologist and seek advice from their office and when to call 911. Participnt verbalized understanding. Before participant experienced this he had been active for about 2 hours doing different things when he has not been that active since his hospitalization. Participant was reminded that he will have some limitations and should start slowly and work up to exerting himself for that long. Partcipant was provided with guidelines and exercise prescription.  Daily Weights/Zone Magnet- Participant has been weighing every morning and his weight has been consistent. He reports no swelling, no shortness of breath, and is following a healthy diet. His fasting blod sugar this am was 94 mg/dL. His BP this am 95/75. Participant was encouraged to continue recording those numbers so he can report them to his Cardiologist so they'll know how he is responding to his new medications.    Exercise/Physical Activity Level- Participant was advised to begin walking in his neighborhood for 10-15 minutes with a THRR of 100-120 bpm, 3 times per week. Safety Guidelines for home exercise were reviewed.  Medication Access and Compliance- Patient has access to his medications and understands how to take them the way they have been prescribed.   Next step- Send exercise videos, home exercise plan and paperwork, continue checking in daily with Better Hearts App, and follow-up call next Monday at [place], [place]  Exercise Physiologist Department Phone #: [phone] Fax:  [phone]  Email Address: [email or web address]

## 2019-10-08 DIAGNOSIS — E1122 Type 2 diabetes mellitus with diabetic chronic kidney disease: Secondary | ICD-10-CM | POA: Diagnosis not present

## 2019-10-08 DIAGNOSIS — N182 Chronic kidney disease, stage 2 (mild): Secondary | ICD-10-CM | POA: Diagnosis not present

## 2019-10-08 DIAGNOSIS — I5021 Acute systolic (congestive) heart failure: Secondary | ICD-10-CM | POA: Diagnosis not present

## 2019-10-08 DIAGNOSIS — N529 Male erectile dysfunction, unspecified: Secondary | ICD-10-CM | POA: Diagnosis not present

## 2019-10-08 DIAGNOSIS — E785 Hyperlipidemia, unspecified: Secondary | ICD-10-CM | POA: Diagnosis not present

## 2019-10-08 DIAGNOSIS — I130 Hypertensive heart and chronic kidney disease with heart failure and stage 1 through stage 4 chronic kidney disease, or unspecified chronic kidney disease: Secondary | ICD-10-CM | POA: Diagnosis not present

## 2019-10-12 ENCOUNTER — Other Ambulatory Visit: Payer: Self-pay

## 2019-10-12 ENCOUNTER — Other Ambulatory Visit: Payer: Self-pay | Admitting: Internal Medicine

## 2019-10-12 DIAGNOSIS — I5022 Chronic systolic (congestive) heart failure: Secondary | ICD-10-CM

## 2019-10-12 NOTE — Progress Notes (Signed)
Telehealth Virtual Call-  Left message on voicemail. Will call participant again tomorrow for follow up.  Jasper Loser, [facility]  Exercise Physiologist Department Phone #: [phone] Fax: [phone] Email Address: [email or web address]

## 2019-10-13 DIAGNOSIS — R202 Paresthesia of skin: Secondary | ICD-10-CM | POA: Diagnosis not present

## 2019-10-13 DIAGNOSIS — M5481 Occipital neuralgia: Secondary | ICD-10-CM | POA: Diagnosis not present

## 2019-10-13 DIAGNOSIS — R200 Anesthesia of skin: Secondary | ICD-10-CM | POA: Diagnosis not present

## 2019-10-13 DIAGNOSIS — M62838 Other muscle spasm: Secondary | ICD-10-CM | POA: Diagnosis not present

## 2019-10-16 DIAGNOSIS — N182 Chronic kidney disease, stage 2 (mild): Secondary | ICD-10-CM | POA: Diagnosis not present

## 2019-10-16 DIAGNOSIS — N529 Male erectile dysfunction, unspecified: Secondary | ICD-10-CM | POA: Diagnosis not present

## 2019-10-16 DIAGNOSIS — I5021 Acute systolic (congestive) heart failure: Secondary | ICD-10-CM | POA: Diagnosis not present

## 2019-10-16 DIAGNOSIS — I130 Hypertensive heart and chronic kidney disease with heart failure and stage 1 through stage 4 chronic kidney disease, or unspecified chronic kidney disease: Secondary | ICD-10-CM | POA: Diagnosis not present

## 2019-10-16 DIAGNOSIS — E785 Hyperlipidemia, unspecified: Secondary | ICD-10-CM | POA: Diagnosis not present

## 2019-10-16 DIAGNOSIS — E1122 Type 2 diabetes mellitus with diabetic chronic kidney disease: Secondary | ICD-10-CM | POA: Diagnosis not present

## 2019-10-20 ENCOUNTER — Ambulatory Visit (INDEPENDENT_AMBULATORY_CARE_PROVIDER_SITE_OTHER): Payer: Medicare Other

## 2019-10-20 ENCOUNTER — Other Ambulatory Visit: Payer: Self-pay

## 2019-10-20 DIAGNOSIS — R0602 Shortness of breath: Secondary | ICD-10-CM

## 2019-10-21 ENCOUNTER — Telehealth: Payer: Self-pay

## 2019-10-21 ENCOUNTER — Other Ambulatory Visit: Payer: Self-pay | Admitting: *Deleted

## 2019-10-21 ENCOUNTER — Other Ambulatory Visit: Payer: Self-pay | Admitting: Internal Medicine

## 2019-10-21 ENCOUNTER — Encounter: Payer: Medicare Other | Attending: Internal Medicine

## 2019-10-21 DIAGNOSIS — I130 Hypertensive heart and chronic kidney disease with heart failure and stage 1 through stage 4 chronic kidney disease, or unspecified chronic kidney disease: Secondary | ICD-10-CM | POA: Diagnosis not present

## 2019-10-21 DIAGNOSIS — E1122 Type 2 diabetes mellitus with diabetic chronic kidney disease: Secondary | ICD-10-CM | POA: Diagnosis not present

## 2019-10-21 DIAGNOSIS — N182 Chronic kidney disease, stage 2 (mild): Secondary | ICD-10-CM | POA: Diagnosis not present

## 2019-10-21 DIAGNOSIS — E785 Hyperlipidemia, unspecified: Secondary | ICD-10-CM | POA: Diagnosis not present

## 2019-10-21 DIAGNOSIS — Z79899 Other long term (current) drug therapy: Secondary | ICD-10-CM | POA: Insufficient documentation

## 2019-10-21 DIAGNOSIS — N529 Male erectile dysfunction, unspecified: Secondary | ICD-10-CM | POA: Diagnosis not present

## 2019-10-21 DIAGNOSIS — I110 Hypertensive heart disease with heart failure: Secondary | ICD-10-CM | POA: Insufficient documentation

## 2019-10-21 DIAGNOSIS — I5021 Acute systolic (congestive) heart failure: Secondary | ICD-10-CM | POA: Diagnosis not present

## 2019-10-21 DIAGNOSIS — E119 Type 2 diabetes mellitus without complications: Secondary | ICD-10-CM | POA: Insufficient documentation

## 2019-10-21 DIAGNOSIS — I5022 Chronic systolic (congestive) heart failure: Secondary | ICD-10-CM | POA: Insufficient documentation

## 2019-10-21 MED ORDER — ATORVASTATIN CALCIUM 40 MG PO TABS: 40.0000 mg | ORAL_TABLET | Freq: Every day | ORAL | 2 refills | Status: DC

## 2019-10-21 MED ORDER — POTASSIUM CHLORIDE CRYS ER 20 MEQ PO TBCR: 20.0000 meq | EXTENDED_RELEASE_TABLET | Freq: Two times a day (BID) | ORAL | 2 refills | Status: DC

## 2019-10-21 MED ORDER — TORSEMIDE 20 MG PO TABS: 40.0000 mg | ORAL_TABLET | Freq: Every day | ORAL | 2 refills | Status: DC

## 2019-10-21 MED ORDER — CARVEDILOL 6.25 MG PO TABS: 6.2500 mg | ORAL_TABLET | Freq: Two times a day (BID) | ORAL | 2 refills | Status: DC

## 2019-10-21 NOTE — Telephone Encounter (Signed)
Entered in Error

## 2019-10-21 NOTE — Progress Notes (Signed)
Virtual Telehealth Call HF Patient  Follow-up- Participant called the past 2 Mondays and could not be reached. Left voicemail both times. This EP reviewed patient chart and noticed an echo completed yesterday. Called patient to check in today, participant did not answer but called right back. Participant reported feeling better.  Daily Weights/Zone Magnet- Participant reports taking daily weights ranging from 215-216 lbs. BP-109/90 he reports having no shortness breath but some pressure that has been there and the dr is aware and that it is not a new symptom. Participant is not experiencing light headedness or dizziness.    Fluid Restriction- Participant is drinking less than 64 fluid oz per day.  Exercise/Physical Activity Level- Participant is walking in stores but not around the block yet. 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   Medication Access and Compliance- Participant reports being out of torsemide, potassium, and almost out of atorvastatin. This EP sent Cardiologist Dr. Mylo Red and Alvis Lemmings, RN secure chat requesting refills to patient pharmacy [facility] on [address], RN called in refills right away and sent a secure chat back. This EP called patient and left voicemail to let him know the refills were called in. This EP used this opportunity for a teaching moment with patient and explained that in the event that we weren't able to reach him that he should not go more than 24 hours without those medications or that he could have serious health risk. Participant was encouraged to continue calling the pharmacy or doctors office if he ever runs out of medications in the future. Participant verbalized understanding.   Next step- Call patient next week, send educational materials, and discuss RN call, RD call, and EP orientation.  Jasper Loser, [place] [facility]  Exercise  Physiologist Department Phone #: [phone] Fax: [phone]  Email Address: [email or web address]

## 2019-10-22 DIAGNOSIS — I5021 Acute systolic (congestive) heart failure: Secondary | ICD-10-CM | POA: Diagnosis not present

## 2019-10-22 DIAGNOSIS — E785 Hyperlipidemia, unspecified: Secondary | ICD-10-CM | POA: Diagnosis not present

## 2019-10-22 DIAGNOSIS — N529 Male erectile dysfunction, unspecified: Secondary | ICD-10-CM | POA: Diagnosis not present

## 2019-10-22 DIAGNOSIS — I509 Heart failure, unspecified: Secondary | ICD-10-CM | POA: Diagnosis not present

## 2019-10-22 DIAGNOSIS — I130 Hypertensive heart and chronic kidney disease with heart failure and stage 1 through stage 4 chronic kidney disease, or unspecified chronic kidney disease: Secondary | ICD-10-CM | POA: Diagnosis not present

## 2019-10-22 DIAGNOSIS — E1122 Type 2 diabetes mellitus with diabetic chronic kidney disease: Secondary | ICD-10-CM | POA: Diagnosis not present

## 2019-10-22 DIAGNOSIS — N182 Chronic kidney disease, stage 2 (mild): Secondary | ICD-10-CM | POA: Diagnosis not present

## 2019-10-22 DIAGNOSIS — Z794 Long term (current) use of insulin: Secondary | ICD-10-CM | POA: Diagnosis not present

## 2019-10-24 NOTE — Progress Notes (Signed)
Patient ID: [name], male    DOB: [birth year], 43 y.o.   MRN: [id number]  HPI  [name] is a 43 y/o male with a history of DM, HTN and chronic heart failure.   Echo report from 10/20/2019 reviewed and showed an EF of 25-30% along with trace Logan. Echo report from 09/15/2019 reviewed and showed an EF of <20% along with mild Logan and trivial TR/PR.   Admitted 09/14/2019 due to acute heart failure. Cardiology consult obtained. Initially given IV lasix and then transitioned to oral diuretics. Discharged after 5 days. Was in the ED 09/07/2019 due to heart failure and chest pain where he was treated and released.   He presents today for a follow-up visit with a chief complaint of minimal fatigue upon moderate exertion. He describes this as chronic in nature having been present for several years. He has associated light-headedness, hip/ neck pain and one episode of vomiting yesterday. He denies any difficulty sleeping, abdominal distention, palpitations, pedal edema, chest pain, shortness of breath, cough, fever or weight gain.   Says that his glucose levels have been fluctuating. Before he goes to bed it will be 75 and when he wakes up in the morning it's in the 300's and he doesn't know what is going on with that.    Past Medical History:  Diagnosis Date  . CHF (congestive heart failure) ([facility])   . Diabetes mellitus   . ED (erectile dysfunction)   . Hypertension    Past Surgical History:  Procedure Laterality Date  . ANKLE SURGERY     both   . back fusion     l4,l5  . BACK SURGERY    . LAPAROSCOPIC APPENDECTOMY N/A 03/28/2019   Procedure: APPENDECTOMY LAPAROSCOPIC;  Surgeon: Sung Amabile, DO;  Location: [facility] ORS;  Service: General;  Laterality: N/A;  . NASAL SINUS SURGERY     Family History  Problem Relation Age of Onset  . Hypertension Mother   . Diabetes Father    Social History   Tobacco Use  . Smoking status: Never Smoker  . Smokeless tobacco: Never Used  Substance Use  Topics  . Alcohol use: No   Allergies  Allergen Reactions  . Food     Chicken, eggs and peaches   Prior to Admission medications   Medication Sig Start Date End Date Taking? Authorizing Provider  atorvastatin (LIPITOR) 40 MG tablet Take 1 tablet (40 mg total) by mouth daily at 6 PM. 10/21/19  Yes Agbor-Etang, Arlys John, MD  Blood Pressure Monitoring (BLOOD PRESSURE MONITOR/L CUFF) MISC 1 Units by Does not apply route as needed. 09/23/19  Yes Emi Belfast, FNP  carvedilol (COREG) 6.25 MG tablet Take 1 tablet (6.25 mg total) by mouth 2 (two) times daily with a meal. 10/21/19  Yes Agbor-Etang, Arlys John, MD  DULoxetine (CYMBALTA) 20 MG capsule Take 2 capsules by mouth daily. 03/03/19  Yes [provider]  ENTRESTO 49-51 MG Take 0.5 tablets by mouth 2 (two) times daily.  09/19/19  Yes [provider]  NOVOLIN 70/30 FLEXPEN (70-30) 100 UNIT/ML PEN Inject 25 Units into the skin 3 (three) times daily.  04/13/19  Yes [provider]  potassium chloride SA (KLOR-CON) 20 MEQ tablet Take 1 tablet (20 mEq total) by mouth 2 (two) times daily. 10/21/19  Yes Agbor-Etang, Arlys John, MD  rOPINIRole (REQUIP) 0.25 MG tablet Take 1 tablet by mouth 3 (three) times daily. 06/08/19  Yes [provider]  torsemide (DEMADEX) 20 MG tablet Take 2  tablets by mouth once daily 10/22/19  Yes Kate Sable, MD    Review of Systems  Constitutional: Positive for fatigue. Negative for appetite change and fever.  HENT: Negative for congestion, postnasal drip and sore throat.   Eyes: Positive for visual disturbance (blurry vision at times).  Respiratory: Negative for cough and shortness of breath.   Cardiovascular: Negative for chest pain, palpitations and leg swelling.  Gastrointestinal: Positive for vomiting (yesterday). Negative for abdominal distention and abdominal pain.  Endocrine: Negative.   Genitourinary: Negative.   Musculoskeletal: Positive for arthralgias (hip pain) and neck pain.  Negative for back pain.  Skin: Negative.   Allergic/Immunologic: Negative.   Neurological: Positive for light-headedness (at times). Negative for dizziness.  Hematological: Negative for adenopathy. Does not bruise/bleed easily.  Psychiatric/Behavioral: Negative for dysphoric mood and sleep disturbance. The patient is not nervous/anxious.    Vitals:   10/26/19 1119  BP: 101/73  Pulse: (!) 102  Resp: 20  SpO2: 100%  Weight: 219 lb 6.4 oz (99.5 kg)  Height: 6' (1.829 m)   Wt Readings from Last 3 Encounters:  10/26/19 219 lb 6.4 oz (99.5 kg)  09/28/19 217 lb 12 oz (98.8 kg)  09/24/19 220 lb 3.2 oz (99.9 kg)   Lab Results  Component Value Date   CREATININE 1.75 (H) 09/21/2019   CREATININE 1.75 (H) 09/19/2019   CREATININE 1.59 (H) 09/18/2019    Physical Exam Vitals signs and nursing note reviewed.  Constitutional:      Appearance: Normal appearance.  HENT:     Head: Normocephalic and atraumatic.  Neck:     Musculoskeletal: Normal range of motion and neck supple.  Cardiovascular:     Rate and Rhythm: Normal rate and regular rhythm.  Pulmonary:     Effort: Pulmonary effort is normal. No respiratory distress.     Breath sounds: No wheezing or rales.  Abdominal:     General: Abdomen is flat. There is no distension.     Palpations: Abdomen is soft.  Musculoskeletal:        General: No tenderness.     Right lower leg: No edema.     Left lower leg: No edema.  Skin:    General: Skin is warm and dry.  Neurological:     General: No focal deficit present.     Mental Status: He is alert and oriented to person, place, and time.  Psychiatric:        Mood and Affect: Mood normal.        Behavior: Behavior normal.        Thought Content: Thought content normal.    Assessment & Plan:  1: Chronic heart failure with reduced ejection fraction- - NYHA class II - euvolemic today - weighing daily and says that his weight has been stable; reminded to call for an overnight weight  gain of >2 pounds or a weekly weight gain of >5 pounds - weight down 1 pound from last visit here 1 month ago - not adding salt and has been trying to read food labels for sodium content - saw cardiology ([place]) 09/28/2019 - working on getting started with cardiac rehab - BNP 09/15/2019 was 1004.0  2: HTN- - BP looks good today although on the low side; no room today for titration up medications - saw PCP Carlean Purl) 09/21/2019 & returns February 2021 - BMP 09/21/2019 reviewed and showed sodium 140, potassium 3.6, creatinine 1.75 and GFR 51.49  3: Type 1 diabetes- - saw endocrinologist Pasty Arch) 09/03/2019 -  A1c 09/15/2019 was 7.5% - says that he doesn't know what is going on with his glucose levels; says that before he goes to bed it can be 75 and when he wakes up it's in the 300's - glucose this morning was 157 - will make a lifestyle referral for patient as he says that he's never been before  Medication bottles were reviewed.   Will not make a return appointment for patient at this time. Advised him that he could call back at anytime to make another appointment

## 2019-10-26 ENCOUNTER — Other Ambulatory Visit: Payer: Self-pay

## 2019-10-26 ENCOUNTER — Ambulatory Visit: Payer: Medicare Other | Attending: Family | Admitting: Family

## 2019-10-26 ENCOUNTER — Encounter: Payer: Self-pay | Admitting: Family

## 2019-10-26 VITALS — BP 101/73 | HR 102 | Resp 20 | Ht 72.0 in | Wt 219.4 lb

## 2019-10-26 DIAGNOSIS — Z981 Arthrodesis status: Secondary | ICD-10-CM | POA: Diagnosis not present

## 2019-10-26 DIAGNOSIS — I110 Hypertensive heart disease with heart failure: Secondary | ICD-10-CM | POA: Diagnosis not present

## 2019-10-26 DIAGNOSIS — Z79899 Other long term (current) drug therapy: Secondary | ICD-10-CM | POA: Insufficient documentation

## 2019-10-26 DIAGNOSIS — I509 Heart failure, unspecified: Secondary | ICD-10-CM | POA: Diagnosis present

## 2019-10-26 DIAGNOSIS — Z833 Family history of diabetes mellitus: Secondary | ICD-10-CM | POA: Insufficient documentation

## 2019-10-26 DIAGNOSIS — I5022 Chronic systolic (congestive) heart failure: Secondary | ICD-10-CM | POA: Diagnosis not present

## 2019-10-26 DIAGNOSIS — Z8249 Family history of ischemic heart disease and other diseases of the circulatory system: Secondary | ICD-10-CM | POA: Diagnosis not present

## 2019-10-26 DIAGNOSIS — E109 Type 1 diabetes mellitus without complications: Secondary | ICD-10-CM | POA: Diagnosis not present

## 2019-10-26 DIAGNOSIS — Z794 Long term (current) use of insulin: Secondary | ICD-10-CM | POA: Insufficient documentation

## 2019-10-26 DIAGNOSIS — I5020 Unspecified systolic (congestive) heart failure: Secondary | ICD-10-CM

## 2019-10-26 DIAGNOSIS — I10 Essential (primary) hypertension: Secondary | ICD-10-CM

## 2019-10-26 NOTE — Patient Instructions (Signed)
Continue weighing daily and call for an overnight weight gain of > 2 pounds or a weekly weight gain of >5 pounds. 

## 2019-11-02 ENCOUNTER — Other Ambulatory Visit: Payer: Self-pay

## 2019-11-02 DIAGNOSIS — I5022 Chronic systolic (congestive) heart failure: Secondary | ICD-10-CM

## 2019-11-02 NOTE — Progress Notes (Signed)
Virtual Eval/Intake Call/ EP Orientation Wed 11/04/19 at 1:30pm Diagnosis Documentation can be found in [facility] 10/26.

## 2019-11-04 ENCOUNTER — Other Ambulatory Visit: Payer: Self-pay

## 2019-11-04 VITALS — Ht 73.5 in | Wt 221.9 lb

## 2019-11-04 DIAGNOSIS — E119 Type 2 diabetes mellitus without complications: Secondary | ICD-10-CM | POA: Diagnosis not present

## 2019-11-04 DIAGNOSIS — Z79899 Other long term (current) drug therapy: Secondary | ICD-10-CM | POA: Diagnosis not present

## 2019-11-04 DIAGNOSIS — I5022 Chronic systolic (congestive) heart failure: Secondary | ICD-10-CM | POA: Diagnosis not present

## 2019-11-04 DIAGNOSIS — I110 Hypertensive heart disease with heart failure: Secondary | ICD-10-CM | POA: Diagnosis present

## 2019-11-04 NOTE — Progress Notes (Signed)
Cardiac Individual Treatment Plan  Patient Details  Name: [name] MRN: [id number] Date of Birth: 1975/12/17 Referring Provider:     Cardiac Rehab from 11/04/2019 in [facility] Cardiac and Pulmonary Rehab  Referring Provider  [name]      Initial Encounter Date:    Cardiac Rehab from 11/04/2019 in [facility] Cardiac and Pulmonary Rehab  Date  11/04/19      Visit Diagnosis: Heart failure, chronic systolic (HCC)  Patient's Home Medications on Admission:  Current Outpatient Medications:  .  atorvastatin (LIPITOR) 40 MG tablet, Take 1 tablet (40 mg total) by mouth daily at 6 PM., Disp: 30 tablet, Rfl: 2 .  Blood Pressure Monitoring (BLOOD PRESSURE MONITOR/L CUFF) MISC, 1 Units by Does not apply route as needed., Disp: 1 each, Rfl: 0 .  carvedilol (COREG) 6.25 MG tablet, Take 1 tablet (6.25 mg total) by mouth 2 (two) times daily with a meal., Disp: 60 tablet, Rfl: 2 .  DULoxetine (CYMBALTA) 20 MG capsule, Take 2 capsules by mouth daily., Disp: , Rfl:  .  ENTRESTO 49-51 MG, Take 0.5 tablets by mouth 2 (two) times daily. , Disp: , Rfl:  .  NOVOLIN 70/30 FLEXPEN (70-30) 100 UNIT/ML PEN, Inject 25 Units into the skin 3 (three) times daily. , Disp: , Rfl:  .  potassium chloride SA (KLOR-CON) 20 MEQ tablet, Take 1 tablet (20 mEq total) by mouth 2 (two) times daily., Disp: 60 tablet, Rfl: 2 .  rOPINIRole (REQUIP) 0.25 MG tablet, Take 1 tablet by mouth 3 (three) times daily., Disp: , Rfl:  .  torsemide (DEMADEX) 20 MG tablet, Take 2 tablets (40 mg total) by mouth daily., Disp: 60 tablet, Rfl: 2  Past Medical History: Past Medical History:  Diagnosis Date  . CHF (congestive heart failure) ([place])   . Diabetes mellitus   . ED (erectile dysfunction)   . Hypertension     Tobacco Use: Social History   Tobacco Use  Smoking Status Never Smoker  Smokeless Tobacco Never Used    Labs: Recent Review Flowsheet Data    Labs for ITP Cardiac and Pulmonary Rehab Latest Ref Rng & Units 04/25/2011  02/09/2012 05/26/2018 09/14/2019 09/15/2019   Cholestrol 0 - 200 mg/dL - - 177 - 118   LDLCALC 0 - 99 mg/dL - - 104(H) - 60   HDL >40 mg/dL - - 57.00 - 48   Trlycerides <150 mg/dL - - 81.0 - 49   Hemoglobin A1c 4.8 - 5.6 % - - 13.4(A) 7.4(H) 7.5(H)   TCO2 0 - 100 mmol/L 30 29 - - -       Exercise Target Goals: Exercise Program Goal: Individual exercise prescription set using results from initial 6 min walk test and THRR while considering  patient's activity barriers and safety.   Exercise Prescription Goal: Initial exercise prescription builds to 30-45 minutes a day of aerobic activity, 2-3 days per week.  Home exercise guidelines will be given to patient during program as part of exercise prescription that the participant will acknowledge.  Activity Barriers & Risk Stratification: Activity Barriers & Cardiac Risk Stratification - 11/02/19 1556      Activity Barriers & Cardiac Risk Stratification   Activity Barriers  Arthritis;Back Problems;Shortness of Breath;Assistive Device       6 Minute Walk: 6 Minute Walk    Row Name 11/04/19 1605         6 Minute Walk   Phase  Initial     Distance  1242 feet  Walk Time  6 minutes     # of Rest Breaks  0     MPH  2.35     METS  4.4     RPE  12     VO2 Peak  15.41     Symptoms  Yes (comment)     Comments  6 minute walk test completed today. Patient did have some chest tightness that he reports as a regular occurence that his Cardiologist is aware of and attributes to his low EF. Participant felt better after rest and moving on to balance test and resistance training.     Resting HR  74 bpm     Resting BP  108/84     Exercise Oxygen Saturation  during 6 min walk  99 %     Max Ex. HR  122 bpm     Max Ex. BP  128/80     2 Minute Post BP  110/80        Oxygen Initial Assessment:   Oxygen Re-Evaluation:   Oxygen Discharge (Final Oxygen Re-Evaluation):   Initial Exercise Prescription: Initial Exercise Prescription -  11/04/19 1600      Date of Initial Exercise RX and Referring Provider   Date  11/04/19    Referring Provider  [name]      Treadmill   MPH  2.2    Grade  0    Minutes  15    METs  2.5      Recumbant Bike   Level  2    RPM  60    Watts  20    Minutes  15    METs  2.5      T5 Nustep   Level  2    SPM  60    Minutes  15    METs  2      Biostep-RELP   Level  2    SPM  50    Minutes  15    METs  2      Prescription Details   Frequency (times per week)  3    Duration  Progress to 30 minutes of continuous aerobic without signs/symptoms of physical distress      Intensity   THRR 40-80% of Max Heartrate  114-155    Ratings of Perceived Exertion  11-15    Perceived Dyspnea  0-4      Progression   Progression  Continue progressive overload as per policy without signs/symptoms or physical distress.      Resistance Training   Training Prescription  Yes    Weight  3    Reps  10-15       Perform Capillary Blood Glucose checks as needed.  Exercise Prescription Changes:   Exercise Comments:   Exercise Goals and Review: Exercise Goals    Row Name 11/04/19 1627             Exercise Goals   Increase Physical Activity  Yes       Intervention  Provide advice, education, support and counseling about physical activity/exercise needs.;Develop an individualized exercise prescription for aerobic and resistive training based on initial evaluation findings, risk stratification, comorbidities and participant's personal goals.       Expected Outcomes  Short Term: Attend rehab on a regular basis to increase amount of physical activity.;Long Term: Add in home exercise to make exercise part of routine and to increase amount of physical activity.;Long Term: Exercising regularly at least 3-5 days  a week.       Increase Strength and Stamina  Yes       Intervention  Provide advice, education, support and counseling about physical activity/exercise needs.;Develop an  individualized exercise prescription for aerobic and resistive training based on initial evaluation findings, risk stratification, comorbidities and participant's personal goals.       Expected Outcomes  Short Term: Increase workloads from initial exercise prescription for resistance, speed, and METs.;Short Term: Perform resistance training exercises routinely during rehab and add in resistance training at home;Long Term: Improve cardiorespiratory fitness, muscular endurance and strength as measured by increased METs and functional capacity (6MWT)       Able to understand and use rate of perceived exertion (RPE) scale  Yes       Intervention  Provide education and explanation on how to use RPE scale       Expected Outcomes  Short Term: Able to use RPE daily in rehab to express subjective intensity level;Long Term:  Able to use RPE to guide intensity level when exercising independently       Able to understand and use Dyspnea scale  Yes       Intervention  Provide education and explanation on how to use Dyspnea scale       Expected Outcomes  Short Term: Able to use Dyspnea scale daily in rehab to express subjective sense of shortness of breath during exertion;Long Term: Able to use Dyspnea scale to guide intensity level when exercising independently       Knowledge and understanding of Target Heart Rate Range (THRR)  Yes       Intervention  Provide education and explanation of THRR including how the numbers were predicted and where they are located for reference       Expected Outcomes  Short Term: Able to state/look up THRR;Long Term: Able to use THRR to govern intensity when exercising independently;Short Term: Able to use daily as guideline for intensity in rehab       Able to check pulse independently  Yes       Intervention  Provide education and demonstration on how to check pulse in carotid and radial arteries.;Review the importance of being able to check your own pulse for safety during  independent exercise       Expected Outcomes  Short Term: Able to explain why pulse checking is important during independent exercise;Long Term: Able to check pulse independently and accurately       Understanding of Exercise Prescription  Yes       Intervention  Provide education, explanation, and written materials on patient's individual exercise prescription       Expected Outcomes  Short Term: Able to explain program exercise prescription;Long Term: Able to explain home exercise prescription to exercise independently          Exercise Goals Re-Evaluation :   Discharge Exercise Prescription (Final Exercise Prescription Changes):   Nutrition:  Target Goals: Understanding of nutrition guidelines, daily intake of sodium '1500mg'$ , cholesterol '200mg'$ , calories 30% from fat and 7% or less from saturated fats, daily to have 5 or more servings of fruits and vegetables.  Biometrics: Pre Biometrics - 11/04/19 1631      Pre Biometrics   Height  6' 1.5" (1.867 m)    Weight  221 lb 14.4 oz (100.7 kg)    BMI (Calculated)  28.88    Single Leg Stand  30 seconds        Nutrition Therapy Plan and Nutrition Goals:  Nutrition Assessments:   Nutrition Goals Re-Evaluation:   Nutrition Goals Discharge (Final Nutrition Goals Re-Evaluation):   Psychosocial: Target Goals: Acknowledge presence or absence of significant depression and/or stress, maximize coping skills, provide positive support system. Participant is able to verbalize types and ability to use techniques and skills needed for reducing stress and depression.   Initial Review & Psychosocial Screening:   Quality of Life Scores:  Quality of Life - 11/04/19 1621      Quality of Life   Select  Quality of Life      Quality of Life Scores   Health/Function Pre  11.6 %    Socioeconomic Pre  25.5 %    Psych/Spiritual Pre  26.57 %    Family Pre  27.6 %    GLOBAL Pre  20.06 %      Scores of 19 and below usually indicate a  poorer quality of life in these areas.  A difference of  2-3 points is a clinically meaningful difference.  A difference of 2-3 points in the total score of the Quality of Life Index has been associated with significant improvement in overall quality of life, self-image, physical symptoms, and general health in studies assessing change in quality of life.  PHQ-9: Recent Review Flowsheet Data    Depression screen [facility] 2/9 05/26/2018 04/26/2014   Decreased Interest 0 0   Down, Depressed, Hopeless 0 0   PHQ - 2 Score 0 0     Interpretation of Total Score  Total Score Depression Severity:  1-4 = Minimal depression, 5-9 = Mild depression, 10-14 = Moderate depression, 15-19 = Moderately severe depression, 20-27 = Severe depression   Psychosocial Evaluation and Intervention:   Psychosocial Re-Evaluation:   Psychosocial Discharge (Final Psychosocial Re-Evaluation):   Vocational Rehabilitation: Provide vocational rehab assistance to qualifying candidates.   Vocational Rehab Evaluation & Intervention:   Education: Education Goals: Education classes will be provided on a variety of topics geared toward better understanding of heart health and risk factor modification. Participant will state understanding/return demonstration of topics presented as noted by education test scores.  Learning Barriers/Preferences: Learning Barriers/Preferences - 11/02/19 1607      Learning Barriers/Preferences   Learning Barriers  None    Learning Preferences  Individual Instruction       Education Topics:  AED/CPR: - Group verbal and written instruction with the use of models to demonstrate the basic use of the AED with the basic ABC's of resuscitation.   General Nutrition Guidelines/Fats and Fiber: -Group instruction provided by verbal, written material, models and posters to present the general guidelines for heart healthy nutrition. Gives an explanation and review of dietary fats and  fiber.   Controlling Sodium/Reading Food Labels: -Group verbal and written material supporting the discussion of sodium use in heart healthy nutrition. Review and explanation with models, verbal and written materials for utilization of the food label.   Exercise Physiology & General Exercise Guidelines: - Group verbal and written instruction with models to review the exercise physiology of the cardiovascular system and associated critical values. Provides general exercise guidelines with specific guidelines to those with heart or lung disease.    Aerobic Exercise & Resistance Training: - Gives group verbal and written instruction on the various components of exercise. Focuses on aerobic and resistive training programs and the benefits of this training and how to safely progress through these programs..   Flexibility, Balance, Mind/Body Relaxation: Provides group verbal/written instruction on the benefits of flexibility and balance training, including  mind/body exercise modes such as yoga, pilates and tai chi.  Demonstration and skill practice provided.   Stress and Anxiety: - Provides group verbal and written instruction about the health risks of elevated stress and causes of high stress.  Discuss the correlation between heart/lung disease and anxiety and treatment options. Review healthy ways to manage with stress and anxiety.   Depression: - Provides group verbal and written instruction on the correlation between heart/lung disease and depressed mood, treatment options, and the stigmas associated with seeking treatment.   Anatomy & Physiology of the Heart: - Group verbal and written instruction and models provide basic cardiac anatomy and physiology, with the coronary electrical and arterial systems. Review of Valvular disease and Heart Failure   Cardiac Procedures: - Group verbal and written instruction to review commonly prescribed medications for heart disease. Reviews the  medication, class of the drug, and side effects. Includes the steps to properly store meds and maintain the prescription regimen. (beta blockers and nitrates)   Cardiac Medications I: - Group verbal and written instruction to review commonly prescribed medications for heart disease. Reviews the medication, class of the drug, and side effects. Includes the steps to properly store meds and maintain the prescription regimen.   Cardiac Medications II: -Group verbal and written instruction to review commonly prescribed medications for heart disease. Reviews the medication, class of the drug, and side effects. (all other drug classes)    Go Sex-Intimacy & Heart Disease, Get SMART - Goal Setting: - Group verbal and written instruction through game format to discuss heart disease and the return to sexual intimacy. Provides group verbal and written material to discuss and apply goal setting through the application of the S.M.A.R.T. Method.   Other Matters of the Heart: - Provides group verbal, written materials and models to describe Stable Angina and Peripheral Artery. Includes description of the disease process and treatment options available to the cardiac patient.   Exercise & Equipment Safety: - Individual verbal instruction and demonstration of equipment use and safety with use of the equipment.   Infection Prevention: - Provides verbal and written material to individual with discussion of infection control including proper hand washing and proper equipment cleaning during exercise session.   Falls Prevention: - Provides verbal and written material to individual with discussion of falls prevention and safety.   Diabetes: - Individual verbal and written instruction to review signs/symptoms of diabetes, desired ranges of glucose level fasting, after meals and with exercise. Acknowledge that pre and post exercise glucose checks will be done for 3 sessions at entry of program.   Know  Your Numbers and Risk Factors: -Group verbal and written instruction about important numbers in your health.  Discussion of what are risk factors and how they play a role in the disease process.  Review of Cholesterol, Blood Pressure, Diabetes, and BMI and the role they play in your overall health.   Sleep Hygiene: -Provides group verbal and written instruction about how sleep can affect your health.  Define sleep hygiene, discuss sleep cycles and impact of sleep habits. Review good sleep hygiene tips.    Other: -Provides group and verbal instruction on various topics (see comments)   Knowledge Questionnaire Score: Knowledge Questionnaire Score - 11/04/19 1623      Knowledge Questionnaire Score   Pre Score  24/26       Core Components/Risk Factors/Patient Goals at Admission: Personal Goals and Risk Factors at Admission - 11/04/19 1623      Core Components/Risk Factors/Patient  Goals on Admission   Diabetes  Yes    Intervention  Provide education about signs/symptoms and action to take for hypo/hyperglycemia.;Provide education about proper nutrition, including hydration, and aerobic/resistive exercise prescription along with prescribed medications to achieve blood glucose in normal ranges: Fasting glucose 65-99 mg/dL    Expected Outcomes  Short Term: Participant verbalizes understanding of the signs/symptoms and immediate care of hyper/hypoglycemia, proper foot care and importance of medication, aerobic/resistive exercise and nutrition plan for blood glucose control.;Long Term: Attainment of HbA1C < 7%.    Heart Failure  Yes    Intervention  Provide a combined exercise and nutrition program that is supplemented with education, support and counseling about heart failure. Directed toward relieving symptoms such as shortness of breath, decreased exercise tolerance, and extremity edema.    Expected Outcomes  Improve functional capacity of life;Short term: Attendance in program 2-3 days a week  with increased exercise capacity. Reported lower sodium intake. Reported increased fruit and vegetable intake. Reports medication compliance.;Short term: Daily weights obtained and reported for increase. Utilizing diuretic protocols set by physician.;Long term: Adoption of self-care skills and reduction of barriers for early signs and symptoms recognition and intervention leading to self-care maintenance.    Hypertension  Yes    Intervention  Provide education on lifestyle modifcations including regular physical activity/exercise, weight management, moderate sodium restriction and increased consumption of fresh fruit, vegetables, and low fat dairy, alcohol moderation, and smoking cessation.;Monitor prescription use compliance.    Expected Outcomes  Short Term: Continued assessment and intervention until BP is < 140/38m HG in hypertensive participants. < 130/871mHG in hypertensive participants with diabetes, heart failure or chronic kidney disease.;Long Term: Maintenance of blood pressure at goal levels.    Lipids  Yes    Intervention  Provide education and support for participant on nutrition & aerobic/resistive exercise along with prescribed medications to achieve LDL '70mg'$ , HDL >'40mg'$ .    Expected Outcomes  Short Term: Participant states understanding of desired cholesterol values and is compliant with medications prescribed. Participant is following exercise prescription and nutrition guidelines.;Long Term: Cholesterol controlled with medications as prescribed, with individualized exercise RX and with personalized nutrition plan. Value goals: LDL < '70mg'$ , HDL > 40 mg.    Stress  Yes    Intervention  Offer individual and/or small group education and counseling on adjustment to heart disease, stress management and health-related lifestyle change. Teach and support self-help strategies.    Personal Goal Other  Yes    Personal Goal  STG- Participant wants to get back to feeling like he's in his [age]'snd  breathing freely. LTG- Participant wants to be able to function better all around.    Intervention  Begin Cardiac Rehab program, learn about and implement heart healthy living,develop habit of exercising and taking care of self.    Expected Outcomes  Accomplishing the set at intake and setting new goals. Finding new normal and how to live comfortably with new diagnosis.       Core Components/Risk Factors/Patient Goals Review:    Core Components/Risk Factors/Patient Goals at Discharge (Final Review):    ITP Comments: ITP Comments    Row Name 10/05/19 1916 11/02/19 1553 11/04/19 1603       ITP Comments  Participant is ready to begin home exercise. Reviewed home exercise plan, signs and symptoms, RPE scale, Dyspnea scale, target heart rate range, and [employer] app.  Virtual Eval/Intake Call/ EP Orientation Wed 11/04/19 at 1:30pm Diagnosis [facility]  6 minute walk test completed today. Patient did have some chest tightness that he reports as a regular occurence that his Cardiologist is aware of and attributes to his low EF. Participant felt better after rest and moving on to balance test and resistance training.        Comments: Initial ITP.

## 2019-11-04 NOTE — Patient Instructions (Signed)
Patient Instructions  Patient Details  Name: [name] MRN: [id number] Date of Birth: 1976/06/02 Referring Provider:  Nelva Bush, MD  Below are your personal goals for exercise, nutrition, and risk factors. Our goal is to help you stay on track towards obtaining and maintaining these goals. We will be discussing your progress on these goals with you throughout the program.  Initial Exercise Prescription: Initial Exercise Prescription - 11/04/19 1600      Date of Initial Exercise RX and Referring Provider   Date  11/04/19    Referring Provider  [name]      Treadmill   MPH  2.2    Grade  0    Minutes  15    METs  2.5      Recumbant Bike   Level  2    RPM  60    Watts  20    Minutes  15    METs  2.5      T5 Nustep   Level  2    SPM  60    Minutes  15    METs  2      Biostep-RELP   Level  2    SPM  50    Minutes  15    METs  2      Prescription Details   Frequency (times per week)  3    Duration  Progress to 30 minutes of continuous aerobic without signs/symptoms of physical distress      Intensity   THRR 40-80% of Max Heartrate  114-155    Ratings of Perceived Exertion  11-15    Perceived Dyspnea  0-4      Progression   Progression  Continue progressive overload as per policy without signs/symptoms or physical distress.      Resistance Training   Training Prescription  Yes    Weight  3    Reps  10-15       Exercise Goals: Frequency: Be able to perform aerobic exercise two to three times per week in program working toward 2-5 days per week of home exercise.  Intensity: Work with a perceived exertion of 11 (fairly light) - 15 (hard) while following your exercise prescription.  We will make changes to your prescription with you as you progress through the program.   Duration: Be able to do 30 to 45 minutes of continuous aerobic exercise in addition to a 5 minute warm-up and a 5 minute cool-down routine.   Nutrition Goals: Your  personal nutrition goals will be established when you do your nutrition analysis with the dietician.  The following are general nutrition guidelines to follow: Cholesterol < 200mg /day Sodium < 1500mg /day Fiber: Men under 50 yrs - 38 grams per day  Personal Goals: Personal Goals and Risk Factors at Admission - 11/04/19 1623      Core Components/Risk Factors/Patient Goals on Admission   Diabetes  Yes    Intervention  Provide education about signs/symptoms and action to take for hypo/hyperglycemia.;Provide education about proper nutrition, including hydration, and aerobic/resistive exercise prescription along with prescribed medications to achieve blood glucose in normal ranges: Fasting glucose 65-99 mg/dL    Expected Outcomes  Short Term: Participant verbalizes understanding of the signs/symptoms and immediate care of hyper/hypoglycemia, proper foot care and importance of medication, aerobic/resistive exercise and nutrition plan for blood glucose control.;Long Term: Attainment of HbA1C < 7%.    Heart Failure  Yes    Intervention  Provide a combined exercise and  nutrition program that is supplemented with education, support and counseling about heart failure. Directed toward relieving symptoms such as shortness of breath, decreased exercise tolerance, and extremity edema.    Expected Outcomes  Improve functional capacity of life;Short term: Attendance in program 2-3 days a week with increased exercise capacity. Reported lower sodium intake. Reported increased fruit and vegetable intake. Reports medication compliance.;Short term: Daily weights obtained and reported for increase. Utilizing diuretic protocols set by physician.;Long term: Adoption of self-care skills and reduction of barriers for early signs and symptoms recognition and intervention leading to self-care maintenance.    Hypertension  Yes    Intervention  Provide education on lifestyle modifcations including regular physical  activity/exercise, weight management, moderate sodium restriction and increased consumption of fresh fruit, vegetables, and low fat dairy, alcohol moderation, and smoking cessation.;Monitor prescription use compliance.    Expected Outcomes  Short Term: Continued assessment and intervention until BP is < 140/68mm HG in hypertensive participants. < 130/65mm HG in hypertensive participants with diabetes, heart failure or chronic kidney disease.;Long Term: Maintenance of blood pressure at goal levels.    Lipids  Yes    Intervention  Provide education and support for participant on nutrition & aerobic/resistive exercise along with prescribed medications to achieve LDL 70mg , HDL >40mg .    Expected Outcomes  Short Term: Participant states understanding of desired cholesterol values and is compliant with medications prescribed. Participant is following exercise prescription and nutrition guidelines.;Long Term: Cholesterol controlled with medications as prescribed, with individualized exercise RX and with personalized nutrition plan. Value goals: LDL < 70mg , HDL > 40 mg.    Stress  Yes    Intervention  Offer individual and/or small group education and counseling on adjustment to heart disease, stress management and health-related lifestyle change. Teach and support self-help strategies.    Personal Goal Other  Yes    Personal Goal  STG- Participant wants to get back to feeling like he's in his 20's and breathing freely. LTG- Participant wants to be able to function better all around.    Intervention  Begin Cardiac Rehab program, learn about and implement heart healthy living,develop habit of exercising and taking care of self.    Expected Outcomes  Accomplishing the set at intake and setting new goals. Finding new normal and how to live comfortably with new diagnosis.       Tobacco Use Initial Evaluation: Social History   Tobacco Use  Smoking Status Never Smoker  Smokeless Tobacco Never Used     Exercise Goals and Review: Exercise Goals    Row Name 11/04/19 1627             Exercise Goals   Increase Physical Activity  Yes       Intervention  Provide advice, education, support and counseling about physical activity/exercise needs.;Develop an individualized exercise prescription for aerobic and resistive training based on initial evaluation findings, risk stratification, comorbidities and participant's personal goals.       Expected Outcomes  Short Term: Attend rehab on a regular basis to increase amount of physical activity.;Long Term: Add in home exercise to make exercise part of routine and to increase amount of physical activity.;Long Term: Exercising regularly at least 3-5 days a week.       Increase Strength and Stamina  Yes       Intervention  Provide advice, education, support and counseling about physical activity/exercise needs.;Develop an individualized exercise prescription for aerobic and resistive training based on initial evaluation findings, risk stratification, comorbidities and  participant's personal goals.       Expected Outcomes  Short Term: Increase workloads from initial exercise prescription for resistance, speed, and METs.;Short Term: Perform resistance training exercises routinely during rehab and add in resistance training at home;Long Term: Improve cardiorespiratory fitness, muscular endurance and strength as measured by increased METs and functional capacity ( )       Able to understand and use rate of perceived exertion (RPE) scale  Yes       Intervention  Provide education and explanation on how to use RPE scale       Expected Outcomes  Short Term: Able to use RPE daily in rehab to express subjective intensity level;Long Term:  Able to use RPE to guide intensity level when exercising independently       Able to understand and use Dyspnea scale  Yes       Intervention  Provide education and explanation on how to use Dyspnea scale       Expected  Outcomes  Short Term: Able to use Dyspnea scale daily in rehab to express subjective sense of shortness of breath during exertion;Long Term: Able to use Dyspnea scale to guide intensity level when exercising independently       Knowledge and understanding of Target Heart Rate Range (THRR)  Yes       Intervention  Provide education and explanation of THRR including how the numbers were predicted and where they are located for reference       Expected Outcomes  Short Term: Able to state/look up THRR;Long Term: Able to use THRR to govern intensity when exercising independently;Short Term: Able to use daily as guideline for intensity in rehab       Able to check pulse independently  Yes       Intervention  Provide education and demonstration on how to check pulse in carotid and radial arteries.;Review the importance of being able to check your own pulse for safety during independent exercise       Expected Outcomes  Short Term: Able to explain why pulse checking is important during independent exercise;Long Term: Able to check pulse independently and accurately       Understanding of Exercise Prescription  Yes       Intervention  Provide education, explanation, and written materials on patient's individual exercise prescription       Expected Outcomes  Short Term: Able to explain program exercise prescription;Long Term: Able to explain home exercise prescription to exercise independently          Copy of goals given to participant.

## 2019-11-05 ENCOUNTER — Ambulatory Visit: Payer: Medicare Other

## 2019-11-05 DIAGNOSIS — E1122 Type 2 diabetes mellitus with diabetic chronic kidney disease: Secondary | ICD-10-CM | POA: Diagnosis not present

## 2019-11-05 DIAGNOSIS — I130 Hypertensive heart and chronic kidney disease with heart failure and stage 1 through stage 4 chronic kidney disease, or unspecified chronic kidney disease: Secondary | ICD-10-CM | POA: Diagnosis not present

## 2019-11-05 DIAGNOSIS — I5021 Acute systolic (congestive) heart failure: Secondary | ICD-10-CM | POA: Diagnosis not present

## 2019-11-05 DIAGNOSIS — N182 Chronic kidney disease, stage 2 (mild): Secondary | ICD-10-CM | POA: Diagnosis not present

## 2019-11-05 DIAGNOSIS — E785 Hyperlipidemia, unspecified: Secondary | ICD-10-CM | POA: Diagnosis not present

## 2019-11-05 DIAGNOSIS — N529 Male erectile dysfunction, unspecified: Secondary | ICD-10-CM | POA: Diagnosis not present

## 2019-11-09 ENCOUNTER — Other Ambulatory Visit: Payer: Self-pay

## 2019-11-09 ENCOUNTER — Encounter: Payer: Medicare Other | Admitting: *Deleted

## 2019-11-09 DIAGNOSIS — I5022 Chronic systolic (congestive) heart failure: Secondary | ICD-10-CM

## 2019-11-09 DIAGNOSIS — I110 Hypertensive heart disease with heart failure: Secondary | ICD-10-CM | POA: Diagnosis not present

## 2019-11-09 LAB — GLUCOSE, CAPILLARY: Glucose-Capillary: 253 mg/dL — ABNORMAL HIGH (ref 70–99)

## 2019-11-09 NOTE — Progress Notes (Signed)
Daily Session Note  Patient Details  Name: [name] MRN: [id number] Date of Birth: 01-13-1976 Referring Provider:     Cardiac Rehab from 11/04/2019 in [facility]  Referring Provider  [name]      Encounter Date: 11/09/2019  Check In: Session Check In - 11/09/19 0924      Check-In   Supervising physician immediately available to respond to emergencies  See telemetry face sheet for immediately available ER MD    Location  [facility]-Cardiac & Pulmonary Rehab    Staff Present  Heath Lark, RN, BSN, CCRP;Joseph Hood RCP,RRT,BSRT;Kelly Clarence, [place], ACSM CEP, Exercise Physiologist    Virtual Visit  No    Medication changes reported      No    Fall or balance concerns reported     No    Warm-up and Cool-down  Performed on first and last piece of equipment    Resistance Training Performed  Yes    VAD Patient?  No    PAD/SET Patient?  No      Pain Assessment   Currently in Pain?  No/denies          Social History   Tobacco Use  Smoking Status Never Smoker  Smokeless Tobacco Never Used    Goals Met:  Independence with exercise equipment Exercise tolerated well No report of cardiac concerns or symptoms  Goals Unmet:  Not Applicable  Comments: First full day of exercise!  Patient was oriented to gym and equipment including functions, settings, policies, and procedures.  Patient's individual exercise prescription and treatment plan were reviewed.  All starting workloads were established based on the results of the 6 minute walk test done at initial orientation visit.  The plan for exercise progression was also introduced and progression will be customized based on patient's performance and goals.    Dr. Emily Filbert is Medical Director for [place] [facility].

## 2019-11-10 ENCOUNTER — Other Ambulatory Visit
Admission: RE | Admit: 2019-11-10 | Discharge: 2019-11-10 | Disposition: A | Discharge status: Home / Self Care | Payer: Medicare Other | Source: Ambulatory Visit | Attending: Nurse Practitioner | Admitting: Nurse Practitioner

## 2019-11-10 ENCOUNTER — Ambulatory Visit (INDEPENDENT_AMBULATORY_CARE_PROVIDER_SITE_OTHER): Payer: Medicare Other | Admitting: Family Medicine

## 2019-11-10 ENCOUNTER — Encounter: Payer: Self-pay | Admitting: Nurse Practitioner

## 2019-11-10 VITALS — BP 90/70 | HR 91 | Ht 73.0 in | Wt 218.8 lb

## 2019-11-10 DIAGNOSIS — I5020 Unspecified systolic (congestive) heart failure: Secondary | ICD-10-CM | POA: Diagnosis present

## 2019-11-10 DIAGNOSIS — I110 Hypertensive heart disease with heart failure: Secondary | ICD-10-CM | POA: Diagnosis not present

## 2019-11-10 DIAGNOSIS — I952 Hypotension due to drugs: Secondary | ICD-10-CM | POA: Diagnosis not present

## 2019-11-10 DIAGNOSIS — I10 Essential (primary) hypertension: Secondary | ICD-10-CM | POA: Diagnosis not present

## 2019-11-10 LAB — BASIC METABOLIC PANEL
Anion gap: 10 (ref 5–15)
BUN: 23 mg/dL — ABNORMAL HIGH (ref 6–20)
CO2: 30 mmol/L (ref 22–32)
Calcium: 8.8 mg/dL — ABNORMAL LOW (ref 8.9–10.3)
Chloride: 98 mmol/L (ref 98–111)
Creatinine, Ser: 1.36 mg/dL — ABNORMAL HIGH (ref 0.61–1.24)
GFR calc Af Amer: >60 mL/min (ref >60)
GFR calc non Af Amer: >60 mL/min (ref >60)
Glucose, Bld: 263 mg/dL — ABNORMAL HIGH (ref 70–99)
Potassium: 3.2 mmol/L — ABNORMAL LOW (ref 3.5–5.1)
Sodium: 138 mmol/L (ref 135–145)

## 2019-11-10 LAB — MAGNESIUM: Magnesium: 1.8 mg/dL (ref 1.7–2.4)

## 2019-11-10 MED ORDER — CARVEDILOL 3.125 MG PO TABS: 3.1250 mg | ORAL_TABLET | Freq: Two times a day (BID) | ORAL | 3 refills | Status: DC

## 2019-11-10 MED ORDER — ENTRESTO 24-26 MG PO TABS: 1.0000 | ORAL_TABLET | Freq: Two times a day (BID) | ORAL | Status: DC

## 2019-11-10 NOTE — Progress Notes (Signed)
Cardiology Office Note  Date: 11/10/2019   ID: [name], DOB [birth year], MRN [id number]  PCP:  Emi Belfast, FNP  Cardiologist:  Debbe Odea, MD Electrophysiologist:  None   Chief Complaint  Patient presents with  . office visit    F/U after echo-Patient states he is feeling weak today; Meds verbally reviewed with patient.  . Follow-up    History of Present Illness: [name] is a 43 y.o. male history of nonischemic cardiomyopathy with an ejection fraction of less than 20% here for follow-up.  Patient was initially seen for complaints of shortness of breath on exertion.  He presented to the emergency room prior to having his echocardiogram performed with worsening dyspnea.  Work-up revealed nonischemic cardiomyopathy after echocardiogram and nuclear stress test performed.  Other history includes type 2 diabetes, hyperlipidemia, hypertension, chronic kidney disease stage III.  Recently seen in the heart failure clinic and Entresto was decreased to one half of 49/51 mg tablet p.o. twice daily due to low blood pressures.  Imdur and spironolactone were also stopped.  His systolic blood pressure was in the 80s at that time.  Blood pressure today 90/70.  Patient states he feels a little weak which is unusual for him.  States he has been feeling constipated and has been taking MiraLAX and enemas for relief with results.  He has been going to cardiac rehabilitation and having no issues with dyspnea or chest pain.  Review review of cardiac related medication indicates patient is taking Entresto 49/51 mg 0.5 tablets by mouth twice daily, torsemide 20 mg 2 tablets daily.  Carvedilol 6.25 mg twice daily, atorvastatin 40 mg daily   Past Medical History:  Diagnosis Date  . CHF (congestive heart failure) (HCC)   . CKD (chronic kidney disease), stage III   . Diabetes mellitus   . ED (erectile dysfunction)   . Hypertension     Past Surgical History:  Procedure  Laterality Date  . ANKLE SURGERY     both   . back fusion     l4,l5  . BACK SURGERY    . LAPAROSCOPIC APPENDECTOMY N/A 03/28/2019   Procedure: APPENDECTOMY LAPAROSCOPIC;  Surgeon: Sung Amabile, DO;  Location: [facility] ORS;  Service: General;  Laterality: N/A;  . NASAL SINUS SURGERY      Current Outpatient Medications  Medication Sig Dispense Refill  . Blood Pressure Monitoring (BLOOD PRESSURE MONITOR/L CUFF) MISC 1 Units by Does not apply route as needed. 1 each 0  . carvedilol (COREG) 3.125 MG tablet Take 1 tablet (3.125 mg total) by mouth 2 (two) times daily with a meal. 60 tablet 3  . DULoxetine (CYMBALTA) 20 MG capsule Take 2 capsules by mouth daily.    Marland Kitchen NOVOLIN 70/30 FLEXPEN (70-30) 100 UNIT/ML PEN Inject into the skin 2 (two) times daily. Sliding scale    . potassium chloride SA (KLOR-CON) 20 MEQ tablet Take 1 tablet (20 mEq total) by mouth 2 (two) times daily. 60 tablet 2  . rOPINIRole (REQUIP) 0.25 MG tablet Take 1 tablet by mouth 3 (three) times daily.    Marland Kitchen torsemide (DEMADEX) 20 MG tablet Take 2 tablets (40 mg total) by mouth daily. 60 tablet 2  . atorvastatin (LIPITOR) 40 MG tablet Take 1 tablet (40 mg total) by mouth daily at 6 PM. (Patient not taking: Reported on 11/10/2019) 30 tablet 2  . sacubitril-valsartan (ENTRESTO) 24-26 MG Take 1 tablet by mouth 2 (two) times daily. 60 tablet    No current  facility-administered medications for this visit.   Allergies:  Food   Social History: The patient  reports that he has never smoked. He has never used smokeless tobacco. He reports that he does not drink alcohol or use drugs.   Family History: The patient's family history includes Diabetes in his father; Hypertension in his mother.   ROS:  Please see the history of present illness. Otherwise, complete review of systems is positive for feeling a little weak..  All other systems are reviewed and negative.   Physical Exam: VS:  BP 90/70 (BP Location: Left Arm, Patient Position:  Sitting, Cuff Size: Large)   Pulse 91   Ht 6\' 1"  (1.854 m)   Wt 218 lb 12 oz (99.2 kg)   SpO2 99%   BMI 28.86 kg/m , BMI Body mass index is 28.86 kg/m.  Wt Readings from Last 3 Encounters:  11/10/19 218 lb 12 oz (99.2 kg)  11/04/19 221 lb 14.4 oz (100.7 kg)  10/26/19 219 lb 6.4 oz (99.5 kg)    General: Patient appears comfortable at rest. Neck: Supple, no elevated JVP or carotid bruits, no thyromegaly. Lungs: Clear to auscultation, nonlabored breathing at rest. Cardiac: Regular rate and rhythm, no S3 or significant systolic murmur, no pericardial rub. Extremities: No pitting edema, distal pulses 2+. Skin: Warm and dry. Neuropsychiatric: Alert and oriented x3, affect grossly appropriate.  ECG:  An ECG dated November 10, 2019 was personally reviewed today and demonstrated:  Normal sinus rhythm rate of 91 nonspecific T wave abnormality prolonged QT  Recent Labwork: 04/02/2019: Magnesium 1.9 09/15/2019: B Natriuretic Peptide 1,004.0; Hemoglobin 13.9; Platelets 166; TSH 1.207 09/21/2019: ALT 27; AST 13; BUN 38; Creatinine, Ser 1.75; Potassium 3.6; Pro B Natriuretic peptide (BNP) 62.0; Sodium 140     Component Value Date/Time   CHOL 118 09/15/2019 0533   TRIG 49 09/15/2019 0533   HDL 48 09/15/2019 0533   CHOLHDL 2.5 09/15/2019 0533   VLDL 10 09/15/2019 0533   LDLCALC 60 09/15/2019 0533    Other Studies Reviewed Today:  The following studies were reviewed today: TTE 09/15/2019 1. Left ventricular ejection fraction, by visual estimation, is <20%. The left ventricle has severely decreased function. Mildly increased left ventricular size. There is mildly increased left ventricular hypertrophy. 2. Elevated mean left atrial pressure. 3. Left ventricular diastolic Doppler parameters are consistent with restrictive filling pattern of LV diastolic filling. 4. Global right ventricle has moderately reduced systolic function.The right ventricular size is normal. No increase in right  ventricular wall thickness. 5. Left atrial size was moderately dilated. 6. Right atrial size was mildly dilated. 7. The mitral valve is normal in structure. Mild mitral valve regurgitation. 8. The tricuspid valve is not well visualized. Tricuspid valve regurgitation is trivial. 9. The aortic valve is tricuspid Aortic valve regurgitation was not visualized by color flow Doppler. Structurally normal aortic valve, with no evidence of sclerosis or stenosis. 10. The pulmonic valve was grossly normal. Pulmonic valve regurgitation is trivial by color flow Doppler. 11. TR signal is inadequate for assessing pulmonary artery systolic pressure. 12. The inferior vena cava is normal in size with <50% respiratory variability, suggesting right atrial pressure of 8 mmHg. 13. The interatrial septum was not well visualized.  Pharm myocardial perfusion imaging stress test  T wave inversion was noted during stress in the II, III, aVF, V5 and V6 leads.  This is an intermediate risk study.  The left ventricular ejection fraction is severely decreased (<30%). Correlate with other modality such as  Echocardiogram  There was a very small partially reversible apical defect. This is likely due to apical thining or artifact  No evidence of ischemia was otherwise noted on this study Assessment and Plan:  1. Essential hypertension   2. Hypertensive heart disease with heart failure (HCC)   3. Hypotension due to drugs    1. Essential hypertension Patient is slightly hypotensive today.  With a blood pressure of 90/70.  States he is feeling a little weak today but denies any sensation of a syncopal or near syncopal episodes.  Slightly lightheaded but otherwise no complaints other than weakness.   2.  HFrEF  Left ventricular ejection fraction, by visual estimation, is <20%. The left ventricle has severely decreased function. Mildly increased left ventricular size. There is mildly increased left ventricular  hypertrophy.  Patient is currently taking Entresto 24/26 mg twice daily.  Torsemide 40 mg daily, Coreg 6.25 mg p.o. twice daily.  Hold Entresto today, decrease torsemide to 20 mg today only.  Hold Coreg for today.  Follow-up in 1 week  3. Hypotension.  Patient states he is feeling a little weak and slightly lightheaded.  States this is the first time this is occurred.  States has been having problems with constipation has been using MiraLAX and enemas.  Blood pressure is 90/70.  Hold Entresto today, decrease torsemide to 20 mg today only.  Hold Coreg for today.  Follow-up in 1 week   Medication Adjustments/Labs and Tests Ordered: Current medicines are reviewed at length with the patient today.  Concerns regarding medicines are outlined above.    Patient Instructions  Medication Instructions:  1- Take 1 tablet (20 mg total) today (half your normal dose. Then resume normal dose.  2- Hold Coreg and Entresto today. 3- DECREASE Coreg Take 1 tablet (3.125 mg total) by mouth 2 (two) times daily with a meal.  *If you need a refill on your cardiac medications before your next appointment, please call your pharmacy*  Lab Work: Your physician recommends that you return for lab work today at the medical mall. (BMET, Mag)No appt is needed. Hours are M-F 7AM- 6 PM.  If you have labs (blood work) drawn today and your tests are completely normal, you will receive your results only by: Marland Kitchen MyChart Message (if you have MyChart) OR . A paper copy in the mail If you have any lab test that is abnormal or we need to change your treatment, we will call you to review the results.  Testing/Procedures: None ordered   Follow-Up: At [facility], you and your health needs are our priority.  As part of our continuing mission to provide you with exceptional heart care, we have created designated Provider Care Teams.  These Care Teams include your primary Cardiologist (physician) and Advanced Practice Providers (APPs  -  Physician Assistants and Nurse Practitioners) who all work together to provide you with the care you need, when you need it.  Your next appointment:   1 week  Other Instructions Please call office is systolic (top number) drops below 100.  How to use a home blood pressure monitor. . Be still. Don't smoke, drink caffeinated beverages or exercise within 30 minutes before measuring your blood pressure. . Sit correctly. Sit with your back straight and supported (on a dining chair, rather than a sofa). Your feet should be flat on the floor and your legs should not be crossed. Your arm should be supported on a flat surface (such as a table) with the upper  arm at heart level. Make sure the bottom of the cuff is placed directly above the bend of the elbow.  . Measure at the same time every day. It's important to take the readings at the same time each day, such as morning and evening. Take reading approximately 1 hour after BP medications.          Signed, Levell July, NP 11/10/2019 9:46 AM    [facility] [place], [place], [place] [address] Phone: [phone]; Fax: [phone]

## 2019-11-10 NOTE — Patient Instructions (Addendum)
Medication Instructions:  1- Take 1 tablet (20 mg total) today (half your normal dose. Then resume normal dose.  2- Hold Coreg and Entresto today. 3- DECREASE Coreg Take 1 tablet (3.125 mg total) by mouth 2 (two) times daily with a meal.  *If you need a refill on your cardiac medications before your next appointment, please call your pharmacy*  Lab Work: Your physician recommends that you return for lab work today at the medical mall. (BMET, Mag)No appt is needed. Hours are M-F 7AM- 6 PM.  If you have labs (blood work) drawn today and your tests are completely normal, you will receive your results only by: Marland Kitchen MyChart Message (if you have MyChart) OR . A paper copy in the mail If you have any lab test that is abnormal or we need to change your treatment, we will call you to review the results.  Testing/Procedures: None ordered   Follow-Up: At [facility], you and your health needs are our priority.  As part of our continuing mission to provide you with exceptional heart care, we have created designated Provider Care Teams.  These Care Teams include your primary Cardiologist (physician) and Advanced Practice Providers (APPs -  Physician Assistants and Nurse Practitioners) who all work together to provide you with the care you need, when you need it.  Your next appointment:   1 week  Other Instructions Please call office is systolic (top number) drops below 100.  How to use a home blood pressure monitor. . Be still. Don't smoke, drink caffeinated beverages or exercise within 30 minutes before measuring your blood pressure. . Sit correctly. Sit with your back straight and supported (on a dining chair, rather than a sofa). Your feet should be flat on the floor and your legs should not be crossed. Your arm should be supported on a flat surface (such as a table) with the upper arm at heart level. Make sure the bottom of the cuff is placed directly above the bend of the elbow.  . Measure at  the same time every day. It's important to take the readings at the same time each day, such as morning and evening. Take reading approximately 1 hour after BP medications.

## 2019-11-11 ENCOUNTER — Telehealth: Payer: Self-pay | Admitting: Nurse Practitioner

## 2019-11-11 MED ORDER — MAGNESIUM OXIDE 400 (241.3 MG) MG PO TABS: 400.0000 mg | ORAL_TABLET | Freq: Every day | ORAL | 1 refills | Status: DC

## 2019-11-11 NOTE — Telephone Encounter (Signed)
Theora Gianotti, NP  11/11/2019 9:23 AM EST    K is low. Mg is borderline. Pls have add MagOx 400mg  daily and have him increase Kdur to 40 BID. Follow up bmet in a week.

## 2019-11-11 NOTE — Telephone Encounter (Signed)
If he missed doses, then after today, he may resume @ 20 BID.  Still needs  F/u bmet next week.

## 2019-11-11 NOTE — Telephone Encounter (Signed)
I spoke with the patient regarding his BMP/ Mag results. He is aware of Ignacia Bayley, NP's recommendations to: 1) start magnesium oxide 400 mg once daily 2) increase potassium to 40 meq BID  The patient states he thought his potassium might be low as he has missed several days of potassium because he doesn't like the tablet.  The patient states he has started these back today and will take potassium 20 meq BID as prescribed.  I have asked that he take an extra 20 meq of potassium tonight and in the AM for (40 meq total for both doses). He is aware I will send in his RX for magnesium and advise Ignacia Bayley, NP that he was missing a lot of doses of his potassium, but will take the 20 meq BID as prescribed.  He is due to see Dr. Garen Lah on 12/28. He is aware we can repeat a BMP at that time.  The patient voices understanding and is agreeable.

## 2019-11-12 NOTE — Telephone Encounter (Signed)
Attempted to call the patient. No answer- I left a message of Ignacia Bayley, NP's recommendations on his voice mail.

## 2019-11-16 ENCOUNTER — Encounter: Payer: Self-pay | Admitting: Cardiology

## 2019-11-16 ENCOUNTER — Ambulatory Visit (INDEPENDENT_AMBULATORY_CARE_PROVIDER_SITE_OTHER): Payer: Medicare Other | Admitting: Cardiology

## 2019-11-16 ENCOUNTER — Other Ambulatory Visit: Payer: Self-pay

## 2019-11-16 ENCOUNTER — Encounter: Payer: Medicare Other | Admitting: *Deleted

## 2019-11-16 VITALS — BP 108/68 | HR 87 | Ht 73.5 in | Wt 220.0 lb

## 2019-11-16 DIAGNOSIS — I428 Other cardiomyopathies: Secondary | ICD-10-CM

## 2019-11-16 DIAGNOSIS — I110 Hypertensive heart disease with heart failure: Secondary | ICD-10-CM | POA: Diagnosis not present

## 2019-11-16 DIAGNOSIS — I5022 Chronic systolic (congestive) heart failure: Secondary | ICD-10-CM

## 2019-11-16 LAB — GLUCOSE, CAPILLARY
Glucose-Capillary: 204 mg/dL — ABNORMAL HIGH (ref 70–99)
Glucose-Capillary: 169 mg/dL — ABNORMAL HIGH (ref 70–99)

## 2019-11-16 MED ORDER — TORSEMIDE 20 MG PO TABS: 20.0000 mg | ORAL_TABLET | Freq: Every day | ORAL | Status: DC

## 2019-11-16 NOTE — Progress Notes (Signed)
Daily Session Note  Patient Details  Name: LUCAS WINOGRAD MRN: [id number] Date of Birth: 1976-02-15 Referring Provider:     Cardiac Rehab from 11/04/2019 in [facility]  Referring Provider  [name]      Encounter Date: 11/16/2019  Check In: Session Check In - 11/16/19 1132      Check-In   Supervising physician immediately available to respond to emergencies  See telemetry face sheet for immediately available ER MD    Location  [facility]-Cardiac & Pulmonary Rehab    Staff Present  Heath Lark, RN, BSN, CCRP;Jeanna Durrell BS, Exercise Physiologist;Joseph Hood RCP,RRT,BSRT    Virtual Visit  No    Medication changes reported      No    Fall or balance concerns reported     No    Warm-up and Cool-down  Performed on first and last piece of equipment    Resistance Training Performed  Yes    VAD Patient?  No    PAD/SET Patient?  No      Pain Assessment   Currently in Pain?  No/denies          Social History   Tobacco Use  Smoking Status Never Smoker  Smokeless Tobacco Never Used    Goals Met:  Independence with exercise equipment Exercise tolerated well No report of cardiac concerns or symptoms  Goals Unmet:  Not Applicable  Comments: Pt able to follow exercise prescription today without complaint.  Will continue to monitor for progression.    Dr. Emily Filbert is Medical Director for [facility].

## 2019-11-16 NOTE — Patient Instructions (Signed)
Medication Instructions:  Your physician has recommended you make the following change in your medication:   REDUCE Torsemide to 20mg  daily.  RESUME 40mg  daily is weight gain, swelling, or shortness of breath.  *If you need a refill on your cardiac medications before your next appointment, please call your pharmacy*  Lab Work: None ordered If you have labs (blood work) drawn today and your tests are completely normal, you will receive your results only by: Marland Kitchen MyChart Message (if you have MyChart) OR . A paper copy in the mail If you have any lab test that is abnormal or we need to change your treatment, we will call you to review the results.  Testing/Procedures: None ordered  Follow-Up: At [facility], you and your health needs are our priority.  As part of our continuing mission to provide you with exceptional heart care, we have created designated Provider Care Teams.  These Care Teams include your primary Cardiologist (physician) and Advanced Practice Providers (APPs -  Physician Assistants and Nurse Practitioners) who all work together to provide you with the care you need, when you need it.  Your next appointment:   4 week(s)  The format for your next appointment:   In Person  Provider:    You may see Kate Sable, MD or one of the following Advanced Practice Providers on your designated Care Team:    Murray Hodgkins, NP  Christell Faith, PA-C  Marrianne Mood, PA-C   Other Instructions Your physician recommends that you weigh, daily, at the same time every day, and in the same amount of clothing. Please record your daily weights.  Your physician has requested that you regularly monitor and record your blood pressure readings at home. Please use the same machine at the same time of day to check your readings and record.  Call the office in 1 week to provide your blood pressure reading and weights.

## 2019-11-16 NOTE — Progress Notes (Signed)
Cardiology Office Note:    Date:  11/16/2019   ID:  [name], DOB 08-Jul-1976, MRN [id number]  PCP:  Emi Belfast, FNP  Cardiologist:  Debbe Odea, MD  Electrophysiologist:  None   Referring MD: Emi Belfast, FNP   Chief Complaint  Patient presents with  . other    2 month f/u no complaints today. Meds reviewed verbaly with pt.    History of Present Illness:    [name] is a 43 y.o. male with a hx of hypertension, diabetes, hyperlipidemia, NICM EF <20 who presents for follow-up.    Patient was originally seen due to shortness of breath on exertion.   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  He was seen in the heart failure clinic and his [name] was decreased to 24-26 mg twice daily and Coreg to 3.125 mg twice daily due to low blood pressures.  His blood pressures have been in the low 100s.  He feels okay, was able to play basketball yesterday.  He is scheduled for cardiac rehab later on today.   Past Medical History:  Diagnosis Date  . CHF (congestive heart failure) (HCC)   . CKD (chronic kidney disease), stage III   . Diabetes mellitus   . ED (erectile dysfunction)   . Hypertension     Past Surgical History:  Procedure Laterality Date  . ANKLE SURGERY     both   . back fusion     l4,l5  . BACK SURGERY    . LAPAROSCOPIC APPENDECTOMY N/A 03/28/2019   Procedure: APPENDECTOMY LAPAROSCOPIC;  Surgeon: Sung Amabile, DO;  Location: [facility] ORS;  Service: General;  Laterality: N/A;  . NASAL SINUS SURGERY      Current Medications: Current Meds  Medication Sig  . atorvastatin (LIPITOR) 40 MG tablet Take 1 tablet (40 mg total) by mouth daily at 6 PM.  . Blood Pressure Monitoring (BLOOD PRESSURE MONITOR/L CUFF) MISC 1 Units by Does not apply route as needed.  . carvedilol (COREG) 3.125 MG tablet Take 1 tablet (3.125 mg total) by mouth 2 (two) times daily with a meal.  .  DULoxetine (CYMBALTA) 20 MG capsule Take 2 capsules by mouth daily.  Marland Kitchen NOVOLIN 70/30 FLEXPEN (70-30) 100 UNIT/ML PEN Inject into the skin 2 (two) times daily. Sliding scale  . potassium chloride SA (KLOR-CON) 20 MEQ tablet Take 1 tablet (20 mEq total) by mouth 2 (two) times daily.  Marland Kitchen rOPINIRole (REQUIP) 0.25 MG tablet Take 1 tablet by mouth 3 (three) times daily.  . sacubitril-valsartan (ENTRESTO) 24-26 MG Take 1 tablet by mouth 2 (two) times daily.  Marland Kitchen torsemide (DEMADEX) 20 MG tablet Take 1 tablet (20 mg total) by mouth daily.  . [DISCONTINUED] torsemide (DEMADEX) 20 MG tablet Take 2 tablets (40 mg total) by mouth daily.     Allergies:   Food   Social History   Socioeconomic History  . Marital status: Married    Spouse name: Not on file  . Number of children: Not on file  . Years of education: Not on file  . Highest education level: Not on file  Occupational History  . Occupation: unemployed  Tobacco Use  . Smoking status: Never Smoker  . Smokeless tobacco: Never Used  Substance and Sexual Activity  . Alcohol use: No  . Drug use: No  . Sexual activity: Yes    Birth control/protection: None  Other Topics Concern  . Not on file  Social History Narrative  . Not on file   Social Determinants of Health   Financial Resource Strain: Low Risk   . Difficulty of Paying Living Expenses: Not hard at all  Food Insecurity: No Food Insecurity  . Worried About Charity fundraiser in the Last Year: Never true  . Ran Out of Food in the Last Year: Never true  Transportation Needs: Unknown  . Lack of Transportation (Medical): Not on file  . Lack of Transportation (Non-Medical): No  Physical Activity: Unknown  . Days of Exercise per Week: 4 days  . Minutes of Exercise per Session: Not on file  Stress: No Stress Concern Present  . Feeling of Stress : Not at all  Social Connections: Slightly Isolated  . Frequency of Communication with Friends and Family: More than three times a week  .  Frequency of Social Gatherings with Friends and Family: More than three times a week  . Attends Religious Services: 1 to 4 times per year  . Active Member of Clubs or Organizations: No  . Attends Archivist Meetings: Never  . Marital Status: Married     Family History: The patient's family history includes Diabetes in his father; Hypertension in his mother.  ROS:   Please see the history of present illness.     All other systems reviewed and are negative.  EKGs/Labs/Other Studies Reviewed:    The following studies were reviewed today: TTE Sep 30, 2019 1. Left ventricular ejection fraction, by visual estimation, is <20%. The left ventricle has severely decreased function. Mildly increased left ventricular size. There is mildly increased left ventricular hypertrophy.  2. Elevated mean left atrial pressure.  3. Left ventricular diastolic Doppler parameters are consistent with restrictive filling pattern of LV diastolic filling.  4. Global right ventricle has moderately reduced systolic function.The right ventricular size is normal. No increase in right ventricular wall thickness.  5. Left atrial size was moderately dilated.  6. Right atrial size was mildly dilated.  7. The mitral valve is normal in structure. Mild mitral valve regurgitation.  8. The tricuspid valve is not well visualized. Tricuspid valve regurgitation is trivial.  9. The aortic valve is tricuspid Aortic valve regurgitation was not visualized by color flow Doppler. Structurally normal aortic valve, with no evidence of sclerosis or stenosis. 10. The pulmonic valve was grossly normal. Pulmonic valve regurgitation is trivial by color flow Doppler. 11. TR signal is inadequate for assessing pulmonary artery systolic pressure. 12. The inferior vena cava is normal in size with <50% respiratory variability, suggesting right atrial pressure of 8 mmHg. 13. The interatrial septum was not well visualized.  pharm myocardial  perfusion imaging stress test  T wave inversion was noted during stress in the II, III, aVF, V5 and V6 leads.  This is an intermediate risk study.  The left ventricular ejection fraction is severely decreased (<30%). Correlate with other modality such as Echocardiogram  There was a very small partially reversible apical defect. This is likely due to apical thining or artifact  No evidence of ischemia was otherwise noted on this study  EKG:  EKG is  ordered today.  The ekg ordered today demonstrates normal sinus rhythm, nonspecific T wave changes.  Recent Labs: 2019/09/30: B Natriuretic Peptide 1,004.0; Hemoglobin 13.9; Platelets 166; TSH 1.207 09/21/2019: ALT 27; Pro B Natriuretic peptide (BNP) 62.0 11/10/2019: BUN 23; Creatinine, Ser 1.36; Magnesium 1.8; Potassium 3.2; Sodium 138  Recent Lipid Panel    Component Value Date/Time   CHOL 118 Sep 30, 2019  0533   TRIG 49 09/15/2019 0533   HDL 48 09/15/2019 0533   CHOLHDL 2.5 09/15/2019 0533   VLDL 10 09/15/2019 0533   LDLCALC 60 09/15/2019 0533    Physical Exam:    VS:  BP 108/68 (BP Location: Left Arm, Patient Position: Sitting, Cuff Size: Normal)   Pulse 87   Ht 6' 1.5" (1.867 m)   Wt 220 lb (99.8 kg)   SpO2 98%   BMI 28.63 kg/m     Wt Readings from Last 3 Encounters:  11/16/19 220 lb (99.8 kg)  11/10/19 218 lb 12 oz (99.2 kg)  11/04/19 221 lb 14.4 oz (100.7 kg)     GEN:  Well nourished, well developed in no acute distress HEENT: Normal NECK: No JVD; No carotid bruits LYMPHATICS: No lymphadenopathy CARDIAC: RRR, no murmurs, rubs, gallops RESPIRATORY:  Clear to auscultation without rales, wheezing or rhonchi  ABDOMEN: Soft, non-tender, non-distended MUSCULOSKELETAL:  No edema; No deformity  SKIN: Warm and dry NEUROLOGIC:  Alert and oriented x 3 PSYCHIATRIC:  Normal affect   ASSESSMENT:   Patient with nonischemic cardiomyopathy, 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  Ejection fraction of less than 20%.  He appears  euvolemic on exam.  Blood pressures on the low side of normal which is preventing up titration of heart failure medications. 1. NICM (nonischemic cardiomyopathy) (HCC)    PLAN:      1. Continue Entresto at 24-26 mg twice daily, Coreg 3.125 mg twice daily.  Decrease torsemide to 20 milligrams daily.  patient advised to check daily weights and blood pressures for the next week.  If he experiences symptoms of shortness of breath and or weight gain, he should resume his prior dose of torsemide.  If blood pressure improves, plan to titrate heart failure medications.  Continue cardiac rehab as scheduled.   Follow-up in 4 weeks  Total encounter time more than 40 minutes  Greater than 50% was spent in counseling and coordination of care with the patient  This note was generated in part or whole with voice recognition software. Voice recognition is usually quite accurate but there are transcription errors that can and very often do occur. I apologize for any typographical errors that were not detected and corrected.  Medication Adjustments/Labs and Tests Ordered: Current medicines are reviewed at length with the patient today.  Concerns regarding medicines are outlined above.  Orders Placed This Encounter  Procedures  . EKG 12-Lead   Meds ordered this encounter  Medications  . torsemide (DEMADEX) 20 MG tablet    Sig: Take 1 tablet (20 mg total) by mouth daily.    Patient Instructions  Medication Instructions:  Your physician has recommended you make the following change in your medication:   REDUCE Torsemide to 20mg  daily.  RESUME 40mg  daily is weight gain, swelling, or shortness of breath.  *If you need a refill on your cardiac medications before your next appointment, please call your pharmacy*  Lab Work: None ordered If you have labs (blood work) drawn today and your tests are completely normal, you will receive your results only by: Marland Kitchen. MyChart Message (if you have MyChart) OR . A  paper copy in the mail If you have any lab test that is abnormal or we need to change your treatment, we will call you to review the results.  Testing/Procedures: None ordered  Follow-Up: At [facility], you and your health needs are our priority.  As part of our continuing mission to provide you with exceptional heart care,  we have created designated Provider Care Teams.  These Care Teams include your primary Cardiologist (physician) and Advanced Practice Providers (APPs -  Physician Assistants and Nurse Practitioners) who all work together to provide you with the care you need, when you need it.  Your next appointment:   4 week(s)  The format for your next appointment:   In Person  Provider:    You may see Debbe Odea, MD or one of the following Advanced Practice Providers on your designated Care Team:    Nicolasa Ducking, NP  Eula Listen, PA-C  Marisue Ivan, PA-C   Other Instructions Your physician recommends that you weigh, daily, at the same time every day, and in the same amount of clothing. Please record your daily weights.  Your physician has requested that you regularly monitor and record your blood pressure readings at home. Please use the same machine at the same time of day to check your readings and record.  Call the office in 1 week to provide your blood pressure reading and weights.       Signed, Debbe Odea, MD  11/16/2019 9:50 AM    [facility]

## 2019-11-18 ENCOUNTER — Encounter: Payer: Self-pay | Admitting: *Deleted

## 2019-11-18 DIAGNOSIS — E1122 Type 2 diabetes mellitus with diabetic chronic kidney disease: Secondary | ICD-10-CM | POA: Diagnosis not present

## 2019-11-18 DIAGNOSIS — I130 Hypertensive heart and chronic kidney disease with heart failure and stage 1 through stage 4 chronic kidney disease, or unspecified chronic kidney disease: Secondary | ICD-10-CM | POA: Diagnosis not present

## 2019-11-18 DIAGNOSIS — I5022 Chronic systolic (congestive) heart failure: Secondary | ICD-10-CM

## 2019-11-18 DIAGNOSIS — N529 Male erectile dysfunction, unspecified: Secondary | ICD-10-CM | POA: Diagnosis not present

## 2019-11-18 DIAGNOSIS — N182 Chronic kidney disease, stage 2 (mild): Secondary | ICD-10-CM | POA: Diagnosis not present

## 2019-11-18 DIAGNOSIS — E785 Hyperlipidemia, unspecified: Secondary | ICD-10-CM | POA: Diagnosis not present

## 2019-11-18 DIAGNOSIS — I5021 Acute systolic (congestive) heart failure: Secondary | ICD-10-CM | POA: Diagnosis not present

## 2019-11-18 NOTE — Progress Notes (Signed)
Cardiac Individual Treatment Plan  Patient Details  Name: [name] MRN: [id number] Date of Birth: January 13, 1976 Referring Provider:     Cardiac Rehab from 11/04/2019 in [facility] Cardiac and Pulmonary Rehab  Referring Provider  [name]      Initial Encounter Date:    Cardiac Rehab from 11/04/2019 in [facility] Cardiac and Pulmonary Rehab  Date  11/04/19      Visit Diagnosis: Heart failure, chronic systolic (HCC)  Patient's Home Medications on Admission:  Current Outpatient Medications:  .  atorvastatin (LIPITOR) 40 MG tablet, Take 1 tablet (40 mg total) by mouth daily at 6 PM., Disp: 30 tablet, Rfl: 2 .  Blood Pressure Monitoring (BLOOD PRESSURE MONITOR/L CUFF) MISC, 1 Units by Does not apply route as needed., Disp: 1 each, Rfl: 0 .  carvedilol (COREG) 3.125 MG tablet, Take 1 tablet (3.125 mg total) by mouth 2 (two) times daily with a meal., Disp: 60 tablet, Rfl: 3 .  DULoxetine (CYMBALTA) 20 MG capsule, Take 2 capsules by mouth daily., Disp: , Rfl:  .  magnesium oxide (MAG-OX) 400 (241.3 Mg) MG tablet, Take 1 tablet (400 mg total) by mouth daily. (Patient not taking: Reported on 11/16/2019), Disp: 90 tablet, Rfl: 1 .  NOVOLIN 70/30 FLEXPEN (70-30) 100 UNIT/ML PEN, Inject into the skin 2 (two) times daily. Sliding scale, Disp: , Rfl:  .  potassium chloride SA (KLOR-CON) 20 MEQ tablet, Take 1 tablet (20 mEq total) by mouth 2 (two) times daily., Disp: 60 tablet, Rfl: 2 .  rOPINIRole (REQUIP) 0.25 MG tablet, Take 1 tablet by mouth 3 (three) times daily., Disp: , Rfl:  .  sacubitril-valsartan (ENTRESTO) 24-26 MG, Take 1 tablet by mouth 2 (two) times daily., Disp: 60 tablet, Rfl:  .  torsemide (DEMADEX) 20 MG tablet, Take 1 tablet (20 mg total) by mouth daily., Disp: , Rfl:   Past Medical History: Past Medical History:  Diagnosis Date  . CHF (congestive heart failure) ([place])   . CKD (chronic kidney disease), stage III   . Diabetes mellitus   . ED (erectile dysfunction)   .  Hypertension     Tobacco Use: Social History   Tobacco Use  Smoking Status Never Smoker  Smokeless Tobacco Never Used    Labs: Recent Review Flowsheet Data    Labs for ITP Cardiac and Pulmonary Rehab Latest Ref Rng & Units 04/25/2011 02/09/2012 05/26/2018 09/14/2019 09/15/2019   Cholestrol 0 - 200 mg/dL - - 177 - 118   LDLCALC 0 - 99 mg/dL - - 104(H) - 60   HDL >40 mg/dL - - 57.00 - 48   Trlycerides <150 mg/dL - - 81.0 - 49   Hemoglobin A1c 4.8 - 5.6 % - - 13.4(A) 7.4(H) 7.5(H)   TCO2 0 - 100 mmol/L 30 29 - - -       Exercise Target Goals: Exercise Program Goal: Individual exercise prescription set using results from initial 6 min walk test and THRR while considering  patient's activity barriers and safety.   Exercise Prescription Goal: Initial exercise prescription builds to 30-45 minutes a day of aerobic activity, 2-3 days per week.  Home exercise guidelines will be given to patient during program as part of exercise prescription that the participant will acknowledge.  Activity Barriers & Risk Stratification: Activity Barriers & Cardiac Risk Stratification - 11/02/19 1556      Activity Barriers & Cardiac Risk Stratification   Activity Barriers  Arthritis;Back Problems;Shortness of Breath;Assistive Device       6 Minute  Walk: 6 Minute Walk    Row Name 11/04/19 1605         6 Minute Walk   Phase  Initial     Distance  1242 feet     Walk Time  6 minutes     # of Rest Breaks  0     MPH  2.35     METS  4.4     RPE  12     VO2 Peak  15.41     Symptoms  Yes (comment)     Comments  6 minute walk test completed today. Patient did have some chest tightness that he reports as a regular occurence that his Cardiologist is aware of and attributes to his low EF. Participant felt better after rest and moving on to balance test and resistance training.     Resting HR  74 bpm     Resting BP  108/84     Exercise Oxygen Saturation  during 6 min walk  99 %     Max Ex. HR  122 bpm      Max Ex. BP  128/80     2 Minute Post BP  110/80        Oxygen Initial Assessment:   Oxygen Re-Evaluation:   Oxygen Discharge (Final Oxygen Re-Evaluation):   Initial Exercise Prescription: Initial Exercise Prescription - 11/04/19 1600      Date of Initial Exercise RX and Referring Provider   Date  11/04/19    Referring Provider  [name]      Treadmill   MPH  2.2    Grade  0    Minutes  15    METs  2.5      Recumbant Bike   Level  2    RPM  60    Watts  20    Minutes  15    METs  2.5      T5 Nustep   Level  2    SPM  60    Minutes  15    METs  2      Biostep-RELP   Level  2    SPM  50    Minutes  15    METs  2      Prescription Details   Frequency (times per week)  3    Duration  Progress to 30 minutes of continuous aerobic without signs/symptoms of physical distress      Intensity   THRR 40-80% of Max Heartrate  114-155    Ratings of Perceived Exertion  11-15    Perceived Dyspnea  0-4      Progression   Progression  Continue progressive overload as per policy without signs/symptoms or physical distress.      Resistance Training   Training Prescription  Yes    Weight  3    Reps  10-15       Perform Capillary Blood Glucose checks as needed.  Exercise Prescription Changes: Exercise Prescription Changes    Row Name 11/10/19 1400             Response to Exercise   Blood Pressure (Admit)  146/64       Blood Pressure (Exit)  120/70       Heart Rate (Admit)  85 bpm       Heart Rate (Exercise)  112 bpm       Heart Rate (Exit)  93 bpm       Rating of Perceived  Exertion (Exercise)  14       Symptoms  none       Comments  1st day       Duration  Progress to 30 minutes of  aerobic without signs/symptoms of physical distress       Intensity  THRR unchanged         Progression   Progression  Continue to progress workloads to maintain intensity without signs/symptoms of physical distress.       Average METs  2.7         Resistance  Training   Training Prescription  Yes       Weight  3 lb       Reps  10-15         Recumbant Bike   Level  1       RPM  60       Watts  25       Minutes  15       METs  2.8         NuStep   Level  2       SPM  80       Minutes  15       METs  2.1          Exercise Comments: Exercise Comments    Row Name 11/09/19 1610           Exercise Comments  First full day of exercise!  Patient was oriented to gym and equipment including functions, settings, policies, and procedures.  Patient's individual exercise prescription and treatment plan were reviewed.  All starting workloads were established based on the results of the 6 minute walk test done at initial orientation visit.  The plan for exercise progression was also introduced and progression will be customized based on patient's performance and goals.          Exercise Goals and Review: Exercise Goals    Row Name 11/04/19 1627             Exercise Goals   Increase Physical Activity  Yes       Intervention  Provide advice, education, support and counseling about physical activity/exercise needs.;Develop an individualized exercise prescription for aerobic and resistive training based on initial evaluation findings, risk stratification, comorbidities and participant's personal goals.       Expected Outcomes  Short Term: Attend rehab on a regular basis to increase amount of physical activity.;Long Term: Add in home exercise to make exercise part of routine and to increase amount of physical activity.;Long Term: Exercising regularly at least 3-5 days a week.       Increase Strength and Stamina  Yes       Intervention  Provide advice, education, support and counseling about physical activity/exercise needs.;Develop an individualized exercise prescription for aerobic and resistive training based on initial evaluation findings, risk stratification, comorbidities and participant's personal goals.       Expected Outcomes  Short Term:  Increase workloads from initial exercise prescription for resistance, speed, and METs.;Short Term: Perform resistance training exercises routinely during rehab and add in resistance training at home;Long Term: Improve cardiorespiratory fitness, muscular endurance and strength as measured by increased METs and functional capacity (6MWT)       Able to understand and use rate of perceived exertion (RPE) scale  Yes       Intervention  Provide education and explanation on how to use RPE scale  Expected Outcomes  Short Term: Able to use RPE daily in rehab to express subjective intensity level;Long Term:  Able to use RPE to guide intensity level when exercising independently       Able to understand and use Dyspnea scale  Yes       Intervention  Provide education and explanation on how to use Dyspnea scale       Expected Outcomes  Short Term: Able to use Dyspnea scale daily in rehab to express subjective sense of shortness of breath during exertion;Long Term: Able to use Dyspnea scale to guide intensity level when exercising independently       Knowledge and understanding of Target Heart Rate Range (THRR)  Yes       Intervention  Provide education and explanation of THRR including how the numbers were predicted and where they are located for reference       Expected Outcomes  Short Term: Able to state/look up THRR;Long Term: Able to use THRR to govern intensity when exercising independently;Short Term: Able to use daily as guideline for intensity in rehab       Able to check pulse independently  Yes       Intervention  Provide education and demonstration on how to check pulse in carotid and radial arteries.;Review the importance of being able to check your own pulse for safety during independent exercise       Expected Outcomes  Short Term: Able to explain why pulse checking is important during independent exercise;Long Term: Able to check pulse independently and accurately       Understanding of Exercise  Prescription  Yes       Intervention  Provide education, explanation, and written materials on patient's individual exercise prescription       Expected Outcomes  Short Term: Able to explain program exercise prescription;Long Term: Able to explain home exercise prescription to exercise independently          Exercise Goals Re-Evaluation : Exercise Goals Re-Evaluation    Row Name 11/09/19 [phone]             Exercise Goal Re-Evaluation   Exercise Goals Review  Able to understand and use rate of perceived exertion (RPE) scale;Able to understand and use Dyspnea scale;Able to check pulse independently;Understanding of Exercise Prescription       Comments  Reviewed RPE scale, THR and program prescription with pt today.  Pt voiced understanding and was given a copy of goals to take home.       Expected Outcomes  Short: Use RPE daily to regulate intensity. Long: Follow program prescription in THR.          Discharge Exercise Prescription (Final Exercise Prescription Changes): Exercise Prescription Changes - 11/10/19 1400      Response to Exercise   Blood Pressure (Admit)  146/64    Blood Pressure (Exit)  120/70    Heart Rate (Admit)  85 bpm    Heart Rate (Exercise)  112 bpm    Heart Rate (Exit)  93 bpm    Rating of Perceived Exertion (Exercise)  14    Symptoms  none    Comments  1st day    Duration  Progress to 30 minutes of  aerobic without signs/symptoms of physical distress    Intensity  THRR unchanged      Progression   Progression  Continue to progress workloads to maintain intensity without signs/symptoms of physical distress.    Average METs  2.7  Resistance Training   Training Prescription  Yes    Weight  3 lb    Reps  10-15      Recumbant Bike   Level  1    RPM  60    Watts  25    Minutes  15    METs  2.8      NuStep   Level  2    SPM  80    Minutes  15    METs  2.1       Nutrition:  Target Goals: Understanding of nutrition guidelines, daily intake of  sodium '1500mg'$ , cholesterol '200mg'$ , calories 30% from fat and 7% or less from saturated fats, daily to have 5 or more servings of fruits and vegetables.  Biometrics: Pre Biometrics - 11/04/19 1631      Pre Biometrics   Height  6' 1.5" (1.867 m)    Weight  221 lb 14.4 oz (100.7 kg)    BMI (Calculated)  28.88    Single Leg Stand  30 seconds        Nutrition Therapy Plan and Nutrition Goals:   Nutrition Assessments:   Nutrition Goals Re-Evaluation:   Nutrition Goals Discharge (Final Nutrition Goals Re-Evaluation):   Psychosocial: Target Goals: Acknowledge presence or absence of significant depression and/or stress, maximize coping skills, provide positive support system. Participant is able to verbalize types and ability to use techniques and skills needed for reducing stress and depression.   Initial Review & Psychosocial Screening:   Quality of Life Scores:  Quality of Life - 11/04/19 1621      Quality of Life   Select  Quality of Life      Quality of Life Scores   Health/Function Pre  11.6 %    Socioeconomic Pre  25.5 %    Psych/Spiritual Pre  26.57 %    Family Pre  27.6 %    GLOBAL Pre  20.06 %      Scores of 19 and below usually indicate a poorer quality of life in these areas.  A difference of  2-3 points is a clinically meaningful difference.  A difference of 2-3 points in the total score of the Quality of Life Index has been associated with significant improvement in overall quality of life, self-image, physical symptoms, and general health in studies assessing change in quality of life.  PHQ-9: Recent Review Flowsheet Data    Depression screen [facility] 2/9 05/26/2018 04/26/2014   Decreased Interest 0 0   Down, Depressed, Hopeless 0 0   PHQ - 2 Score 0 0     Interpretation of Total Score  Total Score Depression Severity:  1-4 = Minimal depression, 5-9 = Mild depression, 10-14 = Moderate depression, 15-19 = Moderately severe depression, 20-27 = Severe depression    Psychosocial Evaluation and Intervention:   Psychosocial Re-Evaluation:   Psychosocial Discharge (Final Psychosocial Re-Evaluation):   Vocational Rehabilitation: Provide vocational rehab assistance to qualifying candidates.   Vocational Rehab Evaluation & Intervention:   Education: Education Goals: Education classes will be provided on a variety of topics geared toward better understanding of heart health and risk factor modification. Participant will state understanding/return demonstration of topics presented as noted by education test scores.  Learning Barriers/Preferences: Learning Barriers/Preferences - 11/02/19 1607      Learning Barriers/Preferences   Learning Barriers  None    Learning Preferences  Individual Instruction       Education Topics:  AED/CPR: - Group verbal and written instruction with the  use of models to demonstrate the basic use of the AED with the basic ABC's of resuscitation.   General Nutrition Guidelines/Fats and Fiber: -Group instruction provided by verbal, written material, models and posters to present the general guidelines for heart healthy nutrition. Gives an explanation and review of dietary fats and fiber.   Controlling Sodium/Reading Food Labels: -Group verbal and written material supporting the discussion of sodium use in heart healthy nutrition. Review and explanation with models, verbal and written materials for utilization of the food label.   Exercise Physiology & General Exercise Guidelines: - Group verbal and written instruction with models to review the exercise physiology of the cardiovascular system and associated critical values. Provides general exercise guidelines with specific guidelines to those with heart or lung disease.    Aerobic Exercise & Resistance Training: - Gives group verbal and written instruction on the various components of exercise. Focuses on aerobic and resistive training programs and the benefits of  this training and how to safely progress through these programs..   Flexibility, Balance, Mind/Body Relaxation: Provides group verbal/written instruction on the benefits of flexibility and balance training, including mind/body exercise modes such as yoga, pilates and tai chi.  Demonstration and skill practice provided.   Stress and Anxiety: - Provides group verbal and written instruction about the health risks of elevated stress and causes of high stress.  Discuss the correlation between heart/lung disease and anxiety and treatment options. Review healthy ways to manage with stress and anxiety.   Depression: - Provides group verbal and written instruction on the correlation between heart/lung disease and depressed mood, treatment options, and the stigmas associated with seeking treatment.   Anatomy & Physiology of the Heart: - Group verbal and written instruction and models provide basic cardiac anatomy and physiology, with the coronary electrical and arterial systems. Review of Valvular disease and Heart Failure   Cardiac Procedures: - Group verbal and written instruction to review commonly prescribed medications for heart disease. Reviews the medication, class of the drug, and side effects. Includes the steps to properly store meds and maintain the prescription regimen. (beta blockers and nitrates)   Cardiac Medications I: - Group verbal and written instruction to review commonly prescribed medications for heart disease. Reviews the medication, class of the drug, and side effects. Includes the steps to properly store meds and maintain the prescription regimen.   Cardiac Medications II: -Group verbal and written instruction to review commonly prescribed medications for heart disease. Reviews the medication, class of the drug, and side effects. (all other drug classes)    Go Sex-Intimacy & Heart Disease, Get SMART - Goal Setting: - Group verbal and written instruction through game  format to discuss heart disease and the return to sexual intimacy. Provides group verbal and written material to discuss and apply goal setting through the application of the S.M.A.R.T. Method.   Other Matters of the Heart: - Provides group verbal, written materials and models to describe Stable Angina and Peripheral Artery. Includes description of the disease process and treatment options available to the cardiac patient.   Exercise & Equipment Safety: - Individual verbal instruction and demonstration of equipment use and safety with use of the equipment.   Infection Prevention: - Provides verbal and written material to individual with discussion of infection control including proper hand washing and proper equipment cleaning during exercise session.   Falls Prevention: - Provides verbal and written material to individual with discussion of falls prevention and safety.   Diabetes: - Individual verbal and  written instruction to review signs/symptoms of diabetes, desired ranges of glucose level fasting, after meals and with exercise. Acknowledge that pre and post exercise glucose checks will be done for 3 sessions at entry of program.   Know Your Numbers and Risk Factors: -Group verbal and written instruction about important numbers in your health.  Discussion of what are risk factors and how they play a role in the disease process.  Review of Cholesterol, Blood Pressure, Diabetes, and BMI and the role they play in your overall health.   Sleep Hygiene: -Provides group verbal and written instruction about how sleep can affect your health.  Define sleep hygiene, discuss sleep cycles and impact of sleep habits. Review good sleep hygiene tips.    Other: -Provides group and verbal instruction on various topics (see comments)   Knowledge Questionnaire Score: Knowledge Questionnaire Score - 11/04/19 1623      Knowledge Questionnaire Score   Pre Score  24/26       Core  Components/Risk Factors/Patient Goals at Admission: Personal Goals and Risk Factors at Admission - 11/04/19 1623      Core Components/Risk Factors/Patient Goals on Admission   Diabetes  Yes    Intervention  Provide education about signs/symptoms and action to take for hypo/hyperglycemia.;Provide education about proper nutrition, including hydration, and aerobic/resistive exercise prescription along with prescribed medications to achieve blood glucose in normal ranges: Fasting glucose 65-99 mg/dL    Expected Outcomes  Short Term: Participant verbalizes understanding of the signs/symptoms and immediate care of hyper/hypoglycemia, proper foot care and importance of medication, aerobic/resistive exercise and nutrition plan for blood glucose control.;Long Term: Attainment of HbA1C < 7%.    Heart Failure  Yes    Intervention  Provide a combined exercise and nutrition program that is supplemented with education, support and counseling about heart failure. Directed toward relieving symptoms such as shortness of breath, decreased exercise tolerance, and extremity edema.    Expected Outcomes  Improve functional capacity of life;Short term: Attendance in program 2-3 days a week with increased exercise capacity. Reported lower sodium intake. Reported increased fruit and vegetable intake. Reports medication compliance.;Short term: Daily weights obtained and reported for increase. Utilizing diuretic protocols set by physician.;Long term: Adoption of self-care skills and reduction of barriers for early signs and symptoms recognition and intervention leading to self-care maintenance.    Hypertension  Yes    Intervention  Provide education on lifestyle modifcations including regular physical activity/exercise, weight management, moderate sodium restriction and increased consumption of fresh fruit, vegetables, and low fat dairy, alcohol moderation, and smoking cessation.;Monitor prescription use compliance.    Expected  Outcomes  Short Term: Continued assessment and intervention until BP is < 140/57m HG in hypertensive participants. < 130/874mHG in hypertensive participants with diabetes, heart failure or chronic kidney disease.;Long Term: Maintenance of blood pressure at goal levels.    Lipids  Yes    Intervention  Provide education and support for participant on nutrition & aerobic/resistive exercise along with prescribed medications to achieve LDL '70mg'$ , HDL >'40mg'$ .    Expected Outcomes  Short Term: Participant states understanding of desired cholesterol values and is compliant with medications prescribed. Participant is following exercise prescription and nutrition guidelines.;Long Term: Cholesterol controlled with medications as prescribed, with individualized exercise RX and with personalized nutrition plan. Value goals: LDL < '70mg'$ , HDL > 40 mg.    Stress  Yes    Intervention  Offer individual and/or small group education and counseling on adjustment to heart disease, stress management and  health-related lifestyle change. Teach and support self-help strategies.    Personal Goal Other  Yes    Personal Goal  STG- Participant wants to get back to feeling like he's in his 43's and breathing freely. LTG- Participant wants to be able to function better all around.    Intervention  Begin Cardiac Rehab program, learn about and implement heart healthy living,develop habit of exercising and taking care of self.    Expected Outcomes  Accomplishing the set at intake and setting new goals. Finding new normal and how to live comfortably with new diagnosis.       Core Components/Risk Factors/Patient Goals Review:    Core Components/Risk Factors/Patient Goals at Discharge (Final Review):    ITP Comments: ITP Comments    Row Name 10/05/19 1916 11/02/19 1553 11/04/19 1603 11/09/19 0925 11/18/19 0851   ITP Comments  Participant is ready to begin home exercise. Reviewed home exercise plan, signs and symptoms, RPE scale,  Dyspnea scale, target heart rate range, and [employer] app.  Virtual Eval/Intake Call/ EP Orientation Wed 11/04/19 at 1:30pm Diagnosis Documentation can be found in [facility] 10/26.  6 minute walk test completed today. Patient did have some chest tightness that he reports as a regular occurence that his Cardiologist is aware of and attributes to his low EF. Participant felt better after rest and moving on to balance test and resistance training.  First full day of exercise!  Patient was oriented to gym and equipment including functions, settings, policies, and procedures.  Patient's individual exercise prescription and treatment plan were reviewed.  All starting workloads were established based on the results of the 6 minute walk test done at initial orientation visit.  The plan for exercise progression was also introduced and progression will be customized based on patient's performance and goals.  30 day review competed . ITP sent to Dr Emily Filbert for review, changes as needed and ITP approval signature  New to program      Comments:

## 2019-11-24 ENCOUNTER — Encounter: Payer: Medicare Other | Attending: Internal Medicine | Admitting: *Deleted

## 2019-11-24 ENCOUNTER — Encounter: Payer: Self-pay | Admitting: *Deleted

## 2019-11-24 ENCOUNTER — Other Ambulatory Visit: Payer: Self-pay

## 2019-11-24 VITALS — BP 130/94 | Ht 73.0 in | Wt 225.9 lb

## 2019-11-24 DIAGNOSIS — Z79899 Other long term (current) drug therapy: Secondary | ICD-10-CM | POA: Insufficient documentation

## 2019-11-24 DIAGNOSIS — E119 Type 2 diabetes mellitus without complications: Secondary | ICD-10-CM | POA: Diagnosis not present

## 2019-11-24 DIAGNOSIS — I110 Hypertensive heart disease with heart failure: Secondary | ICD-10-CM | POA: Insufficient documentation

## 2019-11-24 DIAGNOSIS — I5022 Chronic systolic (congestive) heart failure: Secondary | ICD-10-CM | POA: Diagnosis not present

## 2019-11-24 DIAGNOSIS — Z794 Long term (current) use of insulin: Secondary | ICD-10-CM

## 2019-11-24 DIAGNOSIS — E1142 Type 2 diabetes mellitus with diabetic polyneuropathy: Secondary | ICD-10-CM

## 2019-11-24 NOTE — Progress Notes (Signed)
Diabetes Self-Management Education  Visit Type: First/Initial  Appt. Start Time: 1110 Appt. End Time: 1225  11/24/2019  [name], identified by name and date of birth, is a 44 y.o. male with a diagnosis of Diabetes: Type 2(Pt reports he has been told Type 1 and 2 - but he is still producing some insulin. Endo note has Type 2.).   ASSESSMENT  Blood pressure (!) 130/94, height 6\' 1"  (1.854 m), weight 225 lb 14.4 oz (102.5 kg). Body mass index is 29.8 kg/m.  Diabetes Self-Management Education - 11/24/19 1343      Visit Information   Visit Type  First/Initial      Initial Visit   Diabetes Type  Type 2   Pt reports he has been told Type 1 and 2 - but he is still producing some insulin. Endo note has Type 2.   Are you currently following a meal plan?  Yes    What type of meal plan do you follow?  low sodium due to CHF    Are you taking your medications as prescribed?  Yes    Date Diagnosed  8 years ago      Health Coping   How would you rate your overall health?  Poor      Psychosocial Assessment   Patient Belief/Attitude about Diabetes  Other (comment)   "whatever, blah"   Self-care barriers  None    Self-management support  Doctor's office;Family    Patient Concerns  Nutrition/Meal planning;Medication;Monitoring    Special Needs  None    Preferred Learning Style  Auditory;Visual;Hands on    Learning Readiness  Change in progress    How often do you need to have someone help you when you read instructions, pamphlets, or other written materials from your doctor or pharmacy?  1 - Never    What is the last grade level you completed in school?  3 years college      Pre-Education Assessment   Patient understands the diabetes disease and treatment process.  Needs Review    Patient understands incorporating nutritional management into lifestyle.  Needs Instruction    Patient undertands incorporating physical activity into lifestyle.  Demonstrates understanding / competency     Patient understands using medications safely.  Needs Review    Patient understands monitoring blood glucose, interpreting and using results  Needs Review    Patient understands prevention, detection, and treatment of acute complications.  Needs Review    Patient understands prevention, detection, and treatment of chronic complications.  Needs Review    Patient understands how to develop strategies to address psychosocial issues.  Needs Instruction    Patient understands how to develop strategies to promote health/change behavior.  Needs Instruction      Complications   Last HgB A1C per patient/outside source  7.5 %   09/15/2019   How often do you check your blood sugar?  1-2 times/day    Fasting Blood glucose range (mg/dL)  09/17/2019   Pt reports FBG's range from 80-230 mg/dL   Postprandial Blood glucose range (mg/dL)  --   Pt reports pre-supper readings range from 150-300 mg/dL   Number of hypoglycemic episodes per month 1   Can you tell when your blood sugar is low? Yes   What do you do if your blood sugar in low? Drinks apple juice but his morning he ate oatmeal with raisins   Have you had a dilated eye exam in the past 12 months?  Yes  Have you had a dental exam in the past 12 months?  Yes    Are you checking your feet?  Yes    How many days per week are you checking your feet?  7      Dietary Intake   Breakfast  oatmeal and raisins; pancakes, SF syrup and meat; pear; unsalted pretzels with peanut butter    Snack (morning)  3 snacks/day - peanut butter crackers, peanut butter and jelly sandwich, protein bars, cookies, SF chocolate covered nuts, candy    Lunch  tuna salad with lettuce, spinach, onions, oil and vinegar; left-overs    Snack (afternoon)  same as morning    Dinner  lean beef, fish, low sodium wraps, potato, sweet potatoes, green beans, pasta, greens, cabbage, carrots, squash, zucchini    Snack (evening)  same as morning    Beverage(s)  water,  unsweetend tea, diet soda      Exercise   Exercise Type  Light (walking / raking leaves)    How many days per week to you exercise?  7    How many minutes per day do you exercise?  35    Total minutes per week of exercise  245      Patient Education   Previous Diabetes Education  Yes (please comment)   saw a dietitian once in 2015   Disease state   Definition of diabetes, type 1 and 2, and the diagnosis of diabetes;Factors that contribute to the development of diabetes    Nutrition management   Role of diet in the treatment of diabetes and the relationship between the three main macronutrients and blood glucose level;Food label reading, portion sizes and measuring food.;Carbohydrate counting;Reviewed blood glucose goals for pre and post meals and how to evaluate the patients' food intake on their blood glucose level.;Meal timing in regards to the patients' current diabetes medication.    Physical activity and exercise   Role of exercise on diabetes management, blood pressure control and cardiac health.    Medications  Taught/reviewed insulin injection, site rotation, insulin storage and needle disposal.;Reviewed patients medication for diabetes, action, purpose, timing of dose and side effects.    Monitoring  Purpose and frequency of SMBG.;Taught/discussed recording of test results and interpretation of SMBG.;Identified appropriate SMBG and/or A1C goals.    Acute complications  Taught treatment of hypoglycemia - the 15 rule.    Chronic complications  Relationship between chronic complications and blood glucose control    Psychosocial adjustment  Identified and addressed patients feelings and concerns about diabetes      Individualized Goals (developed by patient)   Reducing Risk Decrease medications Prevent diabetes complications     Outcomes   Expected Outcomes  Demonstrated interest in learning. Expect positive outcomes    Future DMSE  2 wks       Individualized Plan for Diabetes  Self-Management Training:   Learning Objective:  Patient will have a greater understanding of diabetes self-management. Patient education plan is to attend individual and/or group sessions per assessed needs and concerns.   Plan:   Patient Instructions  Check blood sugars 2 x day before breakfast and before supper every day Bring blood sugar records to the next appointment Exercise: Continue some type of exercise for 10-60  minutes 7 days a week Eat 3 meals day,2-3  snacks a day Space meals 4-6 hours apart Limit desserts and sweets Include 1 serving of protein with each meal Complete 3 Day Food Record and bring to next appt Carry  fast acting glucose and a snack at all times Rotate injection sites Return for appointment on:  Tuesday December 08, 2019 at 11:00 am with [facility] (dietitian)  Expected Outcomes:  Demonstrated interest in learning. Expect positive outcomes  Education material provided:  General Meal Planning Guidelines Simple Meal Plan 3 Day Food Record Symptoms, causes and treatments of Hypoglycemia  If problems or questions, patient to contact team via:  Sharion Settler, RN, CCM, CDE [phone]  Future DSME appointment: 2 wks  December 08, 2019 with the dietitian

## 2019-11-24 NOTE — Patient Instructions (Signed)
Check blood sugars 2 x day before breakfast and before supper every day Bring blood sugar records to the next appointment  Exercise: Continue some type of exercise for 10-60  minutes 7 days a week  Eat 3 meals day,2-3  snacks a day Space meals 4-6 hours apart Limit desserts and sweets Include 1 serving of protein with each meal  Complete 3 Day Food Record and bring to next appt  Carry fast acting glucose and a snack at all times Rotate injection sites  Return for appointment on:  Tuesday December 08, 2019 at 11:00 am with [facility] (dietitian)

## 2019-11-26 ENCOUNTER — Encounter: Payer: Medicare Other | Admitting: *Deleted

## 2019-11-26 ENCOUNTER — Other Ambulatory Visit: Payer: Self-pay

## 2019-11-26 DIAGNOSIS — I5022 Chronic systolic (congestive) heart failure: Secondary | ICD-10-CM

## 2019-11-26 DIAGNOSIS — E119 Type 2 diabetes mellitus without complications: Secondary | ICD-10-CM | POA: Diagnosis not present

## 2019-11-26 DIAGNOSIS — Z79899 Other long term (current) drug therapy: Secondary | ICD-10-CM | POA: Diagnosis not present

## 2019-11-26 DIAGNOSIS — I110 Hypertensive heart disease with heart failure: Secondary | ICD-10-CM | POA: Diagnosis not present

## 2019-11-26 NOTE — Progress Notes (Signed)
Daily Session Note  Patient Details  Name: [name] MRN: [id number] Date of Birth: Jun 07, 1976 Referring Provider:     Cardiac Rehab from 11/04/2019 in [facility] Cardiac and Pulmonary Rehab  Referring Provider  [name]      Encounter Date: 11/26/2019  Check In: Session Check In - 11/26/19 1022      Check-In   Supervising physician immediately available to respond to emergencies  See telemetry face sheet for immediately available ER MD    Location  [facility]-Cardiac & Pulmonary Rehab    Staff Present  Heath Lark, RN, BSN, CCRP;Jessica Muir, MA, RCEP, CCRP, CCET;Joseph Magnolia RCP,RRT,BSRT    Virtual Visit  No    Medication changes reported      No    Fall or balance concerns reported     No    Warm-up and Cool-down  Performed on first and last piece of equipment    Resistance Training Performed  Yes    VAD Patient?  No    PAD/SET Patient?  No      Pain Assessment   Currently in Pain?  No/denies          Social History   Tobacco Use  Smoking Status Never Smoker  Smokeless Tobacco Never Used    Goals Met:  Independence with exercise equipment Exercise tolerated well No report of cardiac concerns or symptoms  Goals Unmet:  Not Applicable  Comments: Pt able to follow exercise prescription today without complaint.  Will continue to monitor for progression.    Dr. Emily Filbert is Medical Director for [place] and [facility].

## 2019-12-08 ENCOUNTER — Other Ambulatory Visit: Payer: Self-pay

## 2019-12-08 ENCOUNTER — Encounter: Payer: Medicare Other | Admitting: Dietician

## 2019-12-08 ENCOUNTER — Encounter: Payer: Self-pay | Admitting: Dietician

## 2019-12-08 VITALS — BP 108/80 | Ht 73.0 in | Wt 221.6 lb

## 2019-12-08 DIAGNOSIS — I5022 Chronic systolic (congestive) heart failure: Secondary | ICD-10-CM | POA: Diagnosis not present

## 2019-12-08 DIAGNOSIS — E1142 Type 2 diabetes mellitus with diabetic polyneuropathy: Secondary | ICD-10-CM

## 2019-12-08 DIAGNOSIS — E119 Type 2 diabetes mellitus without complications: Secondary | ICD-10-CM | POA: Diagnosis not present

## 2019-12-08 DIAGNOSIS — Z79899 Other long term (current) drug therapy: Secondary | ICD-10-CM | POA: Diagnosis not present

## 2019-12-08 DIAGNOSIS — I110 Hypertensive heart disease with heart failure: Secondary | ICD-10-CM | POA: Diagnosis not present

## 2019-12-08 DIAGNOSIS — Z794 Long term (current) use of insulin: Secondary | ICD-10-CM

## 2019-12-08 NOTE — Progress Notes (Signed)
Diabetes Self-Management Education  Visit Type:  Comprehensive  Appt. Start Time: 1105 Appt. End Time: 1215  12/08/2019  [name], identified by name and date of birth, is a 44 y.o. male with a diagnosis of Diabetes:  .   ASSESSMENT  Blood pressure 108/80, height 6\' 1"  (1.854 m), weight 221 lb 9.6 oz (100.5 kg). Body mass index is 29.24 kg/m.   Diabetes Self-Management Education - 12/08/19 1111      Complications   How often do you check your blood sugar?  1-2 times/day    Fasting Blood glucose range (mg/dL)  12/10/19   137-307   Postprandial Blood glucose range (mg/dL)  585-277;824-235;>361   147-307   Can you tell when your blood sugar is low?  Yes    What do you do if your blood sugar is low?  drinks about 6oz apple or orange juice    Have you had a dilated eye exam in the past 12 months?  Yes    Have you had a dental exam in the past 12 months?  Yes    Are you checking your feet?  Yes    How many days per week are you checking your feet?  7      Dietary Intake   Breakfast  3 meals and 3 (average) snacks daily      Exercise   Exercise Type  Light (walking / raking leaves)    How many days per week to you exercise?  7    How many minutes per day do you exercise?  30    Total minutes per week of exercise  210      Patient Education   Nutrition management   Role of diet in the treatment of diabetes and the relationship between the three main macronutrients and blood glucose level;Food label reading, portion sizes and measuring food.;Meal timing in regards to the patients' current diabetes medication.;Meal options for control of blood glucose level and chronic complications.;Other (comment)   basic carb counting; low sodium diet for CHF   Acute complications  Other (comment)   reviewed hypoglycemia treatment     Post-Education Assessment   Patient understands the diabetes disease and treatment process.  Demonstrates understanding / competency    Patient understands incorporating nutritional management into lifestyle.  Demonstrates understanding / competency    Patient undertands incorporating physical activity into lifestyle.  Demonstrates understanding / competency    Patient understands using medications safely.  Demonstrates understanding / competency    Patient understands monitoring blood glucose, interpreting and using results  Demonstrates understanding / competency    Patient understands prevention, detection, and treatment of acute complications.  Demonstrates understanding / competency    Patient understands prevention, detection, and treatment of chronic complications.  Demonstrates understanding / competency    Patient understands how to develop strategies to address psychosocial issues.  Demonstrates understanding / competency    Patient understands how to develop strategies to promote health/change behavior.  Demonstrates understanding / competency      Outcomes   Program Status  Completed       Learning Objective:  Patient will have a greater understanding of diabetes self-management. Patient education plan is to attend individual and/or group sessions per assessed needs and concerns. Notes:  Patient reports some food sensitivities and aversions -- poultry, eggs, corn, peas, rice, watermelon, several vegetables. He is avoiding processed meats and most breads due to sodium content -- he is limiting sodium intake to 1800mg  daily due  to CHF. He avoids bananas due to sugar content and dislikes some other fruits.   Advised consistent carb intake with meals and snacks to help stabilize BGs, as well as eating at regular intervals, keeping snacks close by in case of delayed meals.    Plan:   Patient Instructions   Aim for consistent amount of carbohydrate with meals -- 45g up to 60grams. For snacks, aim for 20-30grams of carb.   Make sure to include a source of protein with each meal, and as often as possible with snacks.  OK to use protein powder if needed to add to foods or drinks.   Great job restricting sodium!     Expected Outcomes:  Demonstrated interest in learning. Expect positive outcomes  Education material provided: Planning A Balanced Meal  If problems or questions, patient to contact team via:  Phone and Email

## 2019-12-08 NOTE — Patient Instructions (Signed)
   Aim for consistent amount of carbohydrate with meals -- 45g up to 60grams. For snacks, aim for 20-30grams of carb.   Make sure to include a source of protein with each meal, and as often as possible with snacks. OK to use protein powder if needed to add to foods or drinks.   Great job restricting sodium!

## 2019-12-09 DIAGNOSIS — E1159 Type 2 diabetes mellitus with other circulatory complications: Secondary | ICD-10-CM | POA: Diagnosis not present

## 2019-12-09 DIAGNOSIS — Z794 Long term (current) use of insulin: Secondary | ICD-10-CM | POA: Diagnosis not present

## 2019-12-09 DIAGNOSIS — E785 Hyperlipidemia, unspecified: Secondary | ICD-10-CM | POA: Diagnosis not present

## 2019-12-09 DIAGNOSIS — I10 Essential (primary) hypertension: Secondary | ICD-10-CM | POA: Diagnosis not present

## 2019-12-09 DIAGNOSIS — E1165 Type 2 diabetes mellitus with hyperglycemia: Secondary | ICD-10-CM | POA: Diagnosis not present

## 2019-12-09 DIAGNOSIS — E1142 Type 2 diabetes mellitus with diabetic polyneuropathy: Secondary | ICD-10-CM | POA: Diagnosis not present

## 2019-12-09 DIAGNOSIS — E1169 Type 2 diabetes mellitus with other specified complication: Secondary | ICD-10-CM | POA: Diagnosis not present

## 2019-12-10 ENCOUNTER — Other Ambulatory Visit: Payer: Self-pay

## 2019-12-10 ENCOUNTER — Encounter: Payer: Medicare Other | Admitting: *Deleted

## 2019-12-10 DIAGNOSIS — I5022 Chronic systolic (congestive) heart failure: Secondary | ICD-10-CM

## 2019-12-10 DIAGNOSIS — I110 Hypertensive heart disease with heart failure: Secondary | ICD-10-CM | POA: Diagnosis not present

## 2019-12-10 DIAGNOSIS — Z79899 Other long term (current) drug therapy: Secondary | ICD-10-CM | POA: Diagnosis not present

## 2019-12-10 DIAGNOSIS — I5020 Unspecified systolic (congestive) heart failure: Secondary | ICD-10-CM

## 2019-12-10 DIAGNOSIS — E119 Type 2 diabetes mellitus without complications: Secondary | ICD-10-CM | POA: Diagnosis not present

## 2019-12-10 MED ORDER — ENTRESTO 24-26 MG PO TABS: 1.0000 | ORAL_TABLET | Freq: Two times a day (BID) | ORAL | 3 refills | Status: DC

## 2019-12-10 NOTE — Progress Notes (Signed)
Daily Session Note  Patient Details  Name: [name] MRN: [id number] Date of Birth: 12/09/1975 Referring Provider:     Cardiac Rehab from 11/04/2019 in [facility] Cardiac [facility] Pulmonary Rehab  Referring Provider  [name]      Encounter Date: 12/10/2019  Check In: Session Check In - 12/10/19 1053      Check-In   Supervising physician immediately available to respond to emergencies  See telemetry face sheet for immediately available ER MD    Location  [facility]-Cardiac & Pulmonary Rehab    Staff Present  Nada Maclachlan, BA, ACSM CEP, Exercise Physiologist;Jessica Luan Pulling, MA, RCEP, CCRP, CCET;Carlyle Mcelrath [name], RN BSN;Joseph [place] [employer]    Virtual Visit  No    Medication changes reported      No    Fall or balance concerns reported     No    Warm-up and Cool-down  Performed on first and last piece of equipment    Resistance Training Performed  Yes    VAD Patient?  No    PAD/SET Patient?  No      Pain Assessment   Currently in Pain?  No/denies          Social History   Tobacco Use  Smoking Status Never Smoker  Smokeless Tobacco Never Used    Goals Met:  Independence with exercise equipment Exercise tolerated well No report of cardiac concerns or symptoms Strength training completed today  Goals Unmet:  Not Applicable  Comments: Pt able to follow exercise prescription today without complaint.  Will continue to monitor for progression.  Reviewed home exercise with pt today.  Pt plans to walk and stretch at home for exercise.  Reviewed THR, pulse, RPE, sign and symptoms, NTG use, and when to call 911 or MD.  Also discussed weather considerations and indoor options.  Pt voiced understanding.   Dr. Emily Filbert is Medical Director for [place] and [facility].

## 2019-12-10 NOTE — Telephone Encounter (Signed)
*  STAT* If patient is at the pharmacy, call can be transferred to refill team.   1. Which medications need to be refilled? (please list name of each medication and dose if known) Entresto  2. Which pharmacy/location (including street and city if local pharmacy) is medication to be sent to? [facility] [address]  3. Do they need a 30 day or 90 day supply? 90

## 2019-12-10 NOTE — Telephone Encounter (Signed)
Please advise if ok to refill Entresto 24/26 mg tablet bid. Last filled by Ramon Dredge, NP.

## 2019-12-11 ENCOUNTER — Other Ambulatory Visit: Payer: Self-pay

## 2019-12-11 DIAGNOSIS — I5020 Unspecified systolic (congestive) heart failure: Secondary | ICD-10-CM

## 2019-12-11 MED ORDER — ENTRESTO 24-26 MG PO TABS: 1.0000 | ORAL_TABLET | Freq: Two times a day (BID) | ORAL | 3 refills | Status: DC

## 2019-12-14 ENCOUNTER — Encounter: Payer: Medicare Other | Admitting: *Deleted

## 2019-12-14 ENCOUNTER — Encounter: Payer: Self-pay | Admitting: Cardiology

## 2019-12-14 ENCOUNTER — Other Ambulatory Visit: Payer: Self-pay

## 2019-12-14 ENCOUNTER — Ambulatory Visit (INDEPENDENT_AMBULATORY_CARE_PROVIDER_SITE_OTHER): Payer: Medicare Other | Admitting: Cardiology

## 2019-12-14 VITALS — BP 100/70 | HR 82 | Ht 73.5 in | Wt 223.0 lb

## 2019-12-14 DIAGNOSIS — I428 Other cardiomyopathies: Secondary | ICD-10-CM

## 2019-12-14 DIAGNOSIS — I110 Hypertensive heart disease with heart failure: Secondary | ICD-10-CM | POA: Diagnosis not present

## 2019-12-14 DIAGNOSIS — I5022 Chronic systolic (congestive) heart failure: Secondary | ICD-10-CM | POA: Diagnosis not present

## 2019-12-14 DIAGNOSIS — E119 Type 2 diabetes mellitus without complications: Secondary | ICD-10-CM | POA: Diagnosis not present

## 2019-12-14 DIAGNOSIS — Z79899 Other long term (current) drug therapy: Secondary | ICD-10-CM | POA: Diagnosis not present

## 2019-12-14 NOTE — Patient Instructions (Signed)
Medication Instructions:  Your physician recommends that you continue on your current medications as directed. Please refer to the Current Medication list given to you today.  *If you need a refill on your cardiac medications before your next appointment, please call your pharmacy*  Lab Work: none If you have labs (blood work) drawn today and your tests are completely normal, you will receive your results only by: Marland Kitchen MyChart Message (if you have MyChart) OR . A paper copy in the mail If you have any lab test that is abnormal or we need to change your treatment, we will call you to review the results.  Testing/Procedures: Your physician has requested that you have an echocardiogram. Echocardiography is a painless test that uses sound waves to create images of your heart. It provides your doctor with information about the size and shape of your heart and how well your heart's chambers and valves are working. This procedure takes approximately one hour. There are no restrictions for this procedure. You may get an IV, if needed, to receive an ultrasound enhancing agent through to better visualize your heart.   Follow-Up: At [facility], you and your health needs are our priority.  As part of our continuing mission to provide you with exceptional heart care, we have created designated Provider Care Teams.  These Care Teams include your primary Cardiologist (physician) and Advanced Practice Providers (APPs -  Physician Assistants and Nurse Practitioners) who all work together to provide you with the care you need, when you need it.  Your next appointment:   3 month(s)  The format for your next appointment:   In Person  Provider:    You may see Debbe Odea, MD or one of the following Advanced Practice Providers on your designated Care Team:    Nicolasa Ducking, NP  Eula Listen, PA-C  Marisue Ivan, PA-C

## 2019-12-14 NOTE — Progress Notes (Signed)
Cardiology Office Note:    Date:  12/14/2019   ID:  [name], DOB December 30, 1975, MRN [id number]  PCP:  Emi Belfast, FNP  Cardiologist:  Debbe Odea, MD  Electrophysiologist:  None   Referring MD: Emi Belfast, FNP   Chief Complaint  Patient presents with  . office visit    4 week F/U; Meds verbally reviewed with patient.    History of Present Illness:    [name] is a 44 y.o. male with a hx of hypertension, diabetes, hyperlipidemia, NICM EF <20 who presents for follow-up.    Patient was originally seen due to shortness of breath on exertion.   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  Patient was started on Entresto and Coreg but there was difficulty uptitrating medications due to low normal blood pressures.  Torsemide was decreased to 20 mg daily.  He is able to fully participate in cardiac rehab without symptoms.    Past Medical History:  Diagnosis Date  . CHF (congestive heart failure) (HCC)   . CKD (chronic kidney disease), stage III   . Diabetes mellitus   . ED (erectile dysfunction)   . Hypertension     Past Surgical History:  Procedure Laterality Date  . ANKLE SURGERY     both   . APPENDECTOMY    . back fusion     l4,l5  . BACK SURGERY    . LAPAROSCOPIC APPENDECTOMY N/A 03/28/2019   Procedure: APPENDECTOMY LAPAROSCOPIC;  Surgeon: Sung Amabile, DO;  Location: [facility] ORS;  Service: General;  Laterality: N/A;  . NASAL SINUS SURGERY      Current Medications: Current Meds  Medication Sig  . atorvastatin (LIPITOR) 40 MG tablet Take 1 tablet (40 mg total) by mouth daily at 6 PM.  . Blood Pressure Monitoring (BLOOD PRESSURE MONITOR/L CUFF) MISC 1 Units by Does not apply route as needed.  . carvedilol (COREG) 3.125 MG tablet Take 1 tablet (3.125 mg total) by mouth 2 (two) times daily with a meal.  . DULoxetine (CYMBALTA) 20 MG capsule Take 2 capsules by mouth  daily.  Marland Kitchen NOVOLIN 70/30 FLEXPEN (70-30) 100 UNIT/ML PEN Inject into the skin 2 (two) times daily. Sliding scale  . potassium chloride SA (KLOR-CON) 20 MEQ tablet Take 1 tablet (20 mEq total) by mouth 2 (two) times daily.  Marland Kitchen rOPINIRole (REQUIP) 0.25 MG tablet Take 1 tablet by mouth 3 (three) times daily.  . sacubitril-valsartan (ENTRESTO) 24-26 MG Take 1 tablet by mouth 2 (two) times daily.  Marland Kitchen torsemide (DEMADEX) 20 MG tablet Take 1 tablet (20 mg total) by mouth daily.     Allergies:   Food   Social History   Socioeconomic History  . Marital status: Married    Spouse name: Not on file  . Number of children: Not on file  . Years of education: Not on file  . Highest education level: Not on file  Occupational History  . Occupation: unemployed  Tobacco Use  . Smoking status: Never Smoker  . Smokeless tobacco: Never Used  Substance and Sexual Activity  . Alcohol use: No  . Drug use: No  . Sexual activity: Yes    Birth control/protection: None  Other Topics Concern  . Not on file  Social History Narrative  . Not on file   Social Determinants of Health   Financial Resource Strain: Low Risk   . Difficulty of Paying Living Expenses: Not hard at all  Food Insecurity: No Food Insecurity  . Worried About Charity fundraiser in the Last Year: Never true  . Ran Out of Food in the Last Year: Never true  Transportation Needs: Unknown  . Lack of Transportation (Medical): Not on file  . Lack of Transportation (Non-Medical): No  Physical Activity: Unknown  . Days of Exercise per Week: 4 days  . Minutes of Exercise per Session: Not on file  Stress: No Stress Concern Present  . Feeling of Stress : Not at all  Social Connections: Slightly Isolated  . Frequency of Communication with Friends and Family: More than three times a week  . Frequency of Social Gatherings with Friends and Family: More than three times a week  . Attends Religious Services: 1 to 4 times per year  . Active Member of  Clubs or Organizations: No  . Attends Archivist Meetings: Never  . Marital Status: Married     Family History: The patient's family history includes Diabetes in his father; Hypertension in his mother.  ROS:   Please see the history of present illness.     All other systems reviewed and are negative.  EKGs/Labs/Other Studies Reviewed:    The following studies were reviewed today: TTE September 23, 2019 1. Left ventricular ejection fraction, by visual estimation, is <20%. The left ventricle has severely decreased function. Mildly increased left ventricular size. There is mildly increased left ventricular hypertrophy.  2. Elevated mean left atrial pressure.  3. Left ventricular diastolic Doppler parameters are consistent with restrictive filling pattern of LV diastolic filling.  4. Global right ventricle has moderately reduced systolic function.The right ventricular size is normal. No increase in right ventricular wall thickness.  5. Left atrial size was moderately dilated.  6. Right atrial size was mildly dilated.  7. The mitral valve is normal in structure. Mild mitral valve regurgitation.  8. The tricuspid valve is not well visualized. Tricuspid valve regurgitation is trivial.  9. The aortic valve is tricuspid Aortic valve regurgitation was not visualized by color flow Doppler. Structurally normal aortic valve, with no evidence of sclerosis or stenosis. 10. The pulmonic valve was grossly normal. Pulmonic valve regurgitation is trivial by color flow Doppler. 11. TR signal is inadequate for assessing pulmonary artery systolic pressure. 12. The inferior vena cava is normal in size with <50% respiratory variability, suggesting right atrial pressure of 8 mmHg. 13. The interatrial septum was not well visualized.  pharm myocardial perfusion imaging stress test  T wave inversion was noted during stress in the II, III, aVF, V5 and V6 leads.  This is an intermediate risk study.  The left  ventricular ejection fraction is severely decreased (<30%). Correlate with other modality such as Echocardiogram  There was a very small partially reversible apical defect. This is likely due to apical thining or artifact  No evidence of ischemia was otherwise noted on this study  EKG:  EKG is  ordered today.  The ekg ordered today demonstrates normal sinus rhythm, nonspecific T wave changes.  Recent Labs: 09-23-2019: B Natriuretic Peptide 1,004.0; Hemoglobin 13.9; Platelets 166; TSH 1.207 09/21/2019: ALT 27; Pro B Natriuretic peptide (BNP) 62.0 11/10/2019: BUN 23; Creatinine, Ser 1.36; Magnesium 1.8; Potassium 3.2; Sodium 138  Recent Lipid Panel    Component Value Date/Time   CHOL 118 09/23/2019 0533   TRIG 49 09/23/19 0533   HDL 48 09-23-19 0533   CHOLHDL 2.5 2019/09/23 0533   VLDL 10 Sep 23, 2019 0533   LDLCALC 60 09/23/2019 0533  Physical Exam:    VS:  BP 100/70 (BP Location: Left Arm, Patient Position: Sitting, Cuff Size: Normal)   Pulse 82   Ht 6' 1.5" (1.867 m)   Wt 223 lb (101.2 kg)   SpO2 99%   BMI 29.02 kg/m     Wt Readings from Last 3 Encounters:  12/14/19 223 lb (101.2 kg)  12/08/19 221 lb 9.6 oz (100.5 kg)  11/24/19 225 lb 14.4 oz (102.5 kg)     GEN:  Well nourished, well developed in no acute distress HEENT: Normal NECK: No JVD; No carotid bruits LYMPHATICS: No lymphadenopathy CARDIAC: RRR, no murmurs, rubs, gallops RESPIRATORY:  Clear to auscultation without rales, wheezing or rhonchi  ABDOMEN: Soft, non-tender, non-distended MUSCULOSKELETAL:  No edema; No deformity  SKIN: Warm and dry NEUROLOGIC:  Alert and oriented x 3 PSYCHIATRIC:  Normal affect   ASSESSMENT:   Patient with nonischemic cardiomyopathy, 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  Ejection fraction of less than 20%.  He appears euvolemic on exam.  Blood pressures on the low side of normal which is preventing up titration of heart failure medications. 1. NICM (nonischemic cardiomyopathy)  (HCC)    PLAN:      1. Continue Entresto at 24-26 mg twice daily, Coreg 3.125 mg twice daily.  Continue torsemide 20 milligrams daily.  It has been about 3 months since his last echocardiogram.  We will schedule repeat echocardiogram to evaluate ejection fraction.  If EF is still less than 35%, will have patient see EP for ICD consideration/primary prevention. continue cardiac rehab as scheduled.   Follow-up after echocardiogram  Total encounter time more than 35 minutes  Greater than 50% was spent in counseling and coordination of care with the patient  This note was generated in part or whole with voice recognition software. Voice recognition is usually quite accurate but there are transcription errors that can and very often do occur. I apologize for any typographical errors that were not detected and corrected.  Medication Adjustments/Labs and Tests Ordered: Current medicines are reviewed at length with the patient today.  Concerns regarding medicines are outlined above.  Orders Placed This Encounter  Procedures  . EKG 12-Lead  . ECHOCARDIOGRAM COMPLETE   No orders of the defined types were placed in this encounter.   Patient Instructions  Medication Instructions:  Your physician recommends that you continue on your current medications as directed. Please refer to the Current Medication list given to you today.  *If you need a refill on your cardiac medications before your next appointment, please call your pharmacy*  Lab Work: none If you have labs (blood work) drawn today and your tests are completely normal, you will receive your results only by: Marland Kitchen MyChart Message (if you have MyChart) OR . A paper copy in the mail If you have any lab test that is abnormal or we need to change your treatment, we will call you to review the results.  Testing/Procedures: Your physician has requested that you have an echocardiogram. Echocardiography is a painless test that uses sound waves  to create images of your heart. It provides your doctor with information about the size and shape of your heart and how well your heart's chambers and valves are working. This procedure takes approximately one hour. There are no restrictions for this procedure. You may get an IV, if needed, to receive an ultrasound enhancing agent through to better visualize your heart.   Follow-Up: At [facility], you and your health needs are our priority.  As part of our continuing mission to provide you with exceptional heart care, we have created designated Provider Care Teams.  These Care Teams include your primary Cardiologist (physician) and Advanced Practice Providers (APPs -  Physician Assistants and Nurse Practitioners) who all work together to provide you with the care you need, when you need it.  Your next appointment:   3 month(s)  The format for your next appointment:   In Person  Provider:    You may see Debbe Odea, MD or one of the following Advanced Practice Providers on your designated Care Team:    Nicolasa Ducking, NP  Eula Listen, PA-C  Marisue Ivan, PA-C     Signed, Debbe Odea, MD  12/14/2019 2:58 PM    [facility]

## 2019-12-14 NOTE — Progress Notes (Signed)
Daily Session Note  Patient Details  Name: [name] MRN: [id number] Date of Birth: 01/23/1976 Referring Provider:     Cardiac Rehab from 11/04/2019 in [facility]  Referring Provider  [name]      Encounter Date: 12/14/2019  Check In: Session Check In - 12/14/19 1011      Check-In   Supervising physician immediately available to respond to emergencies  See telemetry face sheet for immediately available ER MD    Location  [facility] Pul[facility]    Staff Present  Nada Maclachlan, BA, ACSM CEP, Exercise Physiologist;Jessica Luan Pulling, MA, RCEP, CCRP, CCET;Esmerelda Finnigan [name], RN BSN;Joseph [place] [employer]    Virtual Visit  No    Medication changes reported      No    Fall or balance concerns reported     No    Warm-up and Cool-down  Performed on first and last piece of equipment    Resistance Training Performed  Yes    VAD Patient?  No    PAD/SET Patient?  No      Pain Assessment   Currently in Pain?  No/denies          Social History   Tobacco Use  Smoking Status Never Smoker  Smokeless Tobacco Never Used    Goals Met:  Independence with exercise equipment Exercise tolerated well No report of cardiac concerns or symptoms Strength training completed today  Goals Unmet:  Not Applicable  Comments: Pt able to follow exercise prescription today without complaint.  Will continue to monitor for progression.    Dr. Emily Filbert is Medical Director for [facility] and [facility].

## 2019-12-16 ENCOUNTER — Encounter: Payer: Self-pay | Admitting: *Deleted

## 2019-12-16 DIAGNOSIS — I5022 Chronic systolic (congestive) heart failure: Secondary | ICD-10-CM

## 2019-12-16 NOTE — Progress Notes (Signed)
Cardiac Individual Treatment Plan  Patient Details  Name: [name] MRN: [id number] Date of Birth: 1975/12/30 Referring Provider:     Cardiac Rehab from 11/04/2019 in [facility] Cardiac [facility] Pulmonary Rehab  Referring Provider  [name]      Initial Encounter Date:    Cardiac Rehab from 11/04/2019 in [facility] Cardiac and Pulmonary Rehab  Date  11/04/19      Visit Diagnosis: Heart failure, chronic systolic (HCC)  Patient's Home Medications on Admission:  Current Outpatient Medications:  .  atorvastatin (LIPITOR) 40 MG tablet, Take 1 tablet (40 mg total) by mouth daily at 6 PM., Disp: 30 tablet, Rfl: 2 .  Blood Pressure Monitoring (BLOOD PRESSURE MONITOR/L CUFF) MISC, 1 Units by Does not apply route as needed., Disp: 1 each, Rfl: 0 .  carvedilol (COREG) 3.125 MG tablet, Take 1 tablet (3.125 mg total) by mouth 2 (two) times daily with a meal., Disp: 60 tablet, Rfl: 3 .  DULoxetine (CYMBALTA) 20 MG capsule, Take 2 capsules by mouth daily., Disp: , Rfl:  .  magnesium oxide (MAG-OX) 400 (241.3 Mg) MG tablet, Take 1 tablet (400 mg total) by mouth daily. (Patient not taking: Reported on 12/14/2019), Disp: 90 tablet, Rfl: 1 .  NOVOLIN 70/30 FLEXPEN (70-30) 100 UNIT/ML PEN, Inject into the skin 2 (two) times daily. Sliding scale, Disp: , Rfl:  .  potassium chloride SA (KLOR-CON) 20 MEQ tablet, Take 1 tablet (20 mEq total) by mouth 2 (two) times daily., Disp: 60 tablet, Rfl: 2 .  rOPINIRole (REQUIP) 0.25 MG tablet, Take 1 tablet by mouth 3 (three) times daily., Disp: , Rfl:  .  sacubitril-valsartan (ENTRESTO) 24-26 MG, Take 1 tablet by mouth 2 (two) times daily., Disp: 60 tablet, Rfl: 3 .  torsemide (DEMADEX) 20 MG tablet, Take 1 tablet (20 mg total) by mouth daily., Disp: , Rfl:   Past Medical History: Past Medical History:  Diagnosis Date  . CHF (congestive heart failure) ([place])   . CKD (chronic kidney disease), stage III   . Diabetes mellitus   . ED (erectile dysfunction)   .  Hypertension     Tobacco Use: Social History   Tobacco Use  Smoking Status Never Smoker  Smokeless Tobacco Never Used    Labs: Recent Review Flowsheet Data    Labs for ITP Cardiac and Pulmonary Rehab Latest Ref Rng & Units 04/25/2011 02/09/2012 05/26/2018 09/14/2019 09/15/2019   Cholestrol 0 - 200 mg/dL - - 177 - 118   LDLCALC 0 - 99 mg/dL - - 104(H) - 60   HDL >40 mg/dL - - 57.00 - 48   Trlycerides <150 mg/dL - - 81.0 - 49   Hemoglobin A1c 4.8 - 5.6 % - - 13.4(A) 7.4(H) 7.5(H)   TCO2 0 - 100 mmol/L 30 29 - - -       Exercise Target Goals: Exercise Program Goal: Individual exercise prescription set using results from initial 6 min walk test and THRR while considering  patient's activity barriers and safety.   Exercise Prescription Goal: Initial exercise prescription builds to 30-45 minutes a day of aerobic activity, 2-3 days per week.  Home exercise guidelines will be given to patient during program as part of exercise prescription that the participant will acknowledge.  Activity Barriers & Risk Stratification: Activity Barriers & Cardiac Risk Stratification - 11/02/19 1556      Activity Barriers & Cardiac Risk Stratification   Activity Barriers  Arthritis;Back Problems;Shortness of Breath;Assistive Device       6 Minute  Walk: 6 Minute Walk    Row Name 11/04/19 1605         6 Minute Walk   Phase  Initial     Distance  1242 feet     Walk Time  6 minutes     # of Rest Breaks  0     MPH  2.35     METS  4.4     RPE  12     VO2 Peak  15.41     Symptoms  Yes (comment)     Comments  6 minute walk test completed today. Patient did have some chest tightness that he reports as a regular occurence that his Cardiologist is aware of and attributes to his low EF. Participant felt better after rest and moving on to balance test and resistance training.     Resting HR  74 bpm     Resting BP  108/84     Exercise Oxygen Saturation  during 6 min walk  99 %     Max Ex. HR  122 bpm      Max Ex. BP  128/80     2 Minute Post BP  110/80        Oxygen Initial Assessment:   Oxygen Re-Evaluation:   Oxygen Discharge (Final Oxygen Re-Evaluation):   Initial Exercise Prescription: Initial Exercise Prescription - 11/04/19 1600      Date of Initial Exercise RX and Referring Provider   Date  11/04/19    Referring Provider  [name]      Treadmill   MPH  2.2    Grade  0    Minutes  15    METs  2.5      Recumbant Bike   Level  2    RPM  60    Watts  20    Minutes  15    METs  2.5      T5 Nustep   Level  2    SPM  60    Minutes  15    METs  2      Biostep-RELP   Level  2    SPM  50    Minutes  15    METs  2      Prescription Details   Frequency (times per week)  3    Duration  Progress to 30 minutes of continuous aerobic without signs/symptoms of physical distress      Intensity   THRR 40-80% of Max Heartrate  114-155    Ratings of Perceived Exertion  11-15    Perceived Dyspnea  0-4      Progression   Progression  Continue progressive overload as per policy without signs/symptoms or physical distress.      Resistance Training   Training Prescription  Yes    Weight  3    Reps  10-15       Perform Capillary Blood Glucose checks as needed.  Exercise Prescription Changes: Exercise Prescription Changes    Row Name 11/10/19 1400 11/27/19 1000 12/09/19 1400         Response to Exercise   Blood Pressure (Admit)  146/64  100/64  138/70     Blood Pressure (Exercise)  -  112/60  138/82     Blood Pressure (Exit)  120/70  94/60  90/60     Heart Rate (Admit)  85 bpm  87 bpm  114 bpm     Heart Rate (Exercise)  112 bpm  113 bpm  140 bpm     Heart Rate (Exit)  93 bpm  89 bpm  97 bpm     Rating of Perceived Exertion (Exercise)  '14  17  17     '$ Symptoms  none  fatigue on elliptical  -     Comments  1st day  -  -     Duration  Progress to 30 minutes of  aerobic without signs/symptoms of physical distress  Continue with 30 min of aerobic  exercise without signs/symptoms of physical distress.  Continue with 30 min of aerobic exercise without signs/symptoms of physical distress.     Intensity  THRR unchanged  THRR unchanged  THRR unchanged       Progression   Progression  Continue to progress workloads to maintain intensity without signs/symptoms of physical distress.  Continue to progress workloads to maintain intensity without signs/symptoms of physical distress.  Continue to progress workloads to maintain intensity without signs/symptoms of physical distress.     Average METs  2.7  2.91  2.7       Resistance Training   Training Prescription  Yes  Yes  Yes     Weight  3 lb  3 lb  3 lb     Reps  10-15  10-15  10-15       Interval Training   Interval Training  -  No  No       Treadmill   MPH  -  3  2.2     Grade  -  1  0     Minutes  -  15  15     METs  -  3.71  2.69       Recumbant Bike   Level  1  -  -     RPM  60  -  -     Watts  25  -  -     Minutes  15  -  -     METs  2.8  -  -       NuStep   Level  2  -  -     SPM  80  -  -     Minutes  15  -  -     METs  2.1  -  -       Elliptical   Level  -  1  1     Speed  -  2.5  2.5     Minutes  -  10 2-5 min bouts  10 short bouts as tolerated       T5 Nustep   Level  -  2  -     Minutes  -  15  -     METs  -  2.1  -        Exercise Comments: Exercise Comments    Row Name 11/09/19 0925 11/26/19 1049         Exercise Comments  First full day of exercise!  Patient was oriented to gym and equipment including functions, settings, policies, and procedures.  Patient's individual exercise prescription and treatment plan were reviewed.  All starting workloads were established based on the results of the 6 minute walk test done at initial orientation visit.  The plan for exercise progression was also introduced and progression will be customized based on patient's performance and goals.  Rons sciatica has been bothering him.  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  Staff showed Ron stretches (modified figure 4 )  and recommended PT consult in the meantime.         Exercise Goals and Review: Exercise Goals    Row Name 11/04/19 1627             Exercise Goals   Increase Physical Activity  Yes       Intervention  Provide advice, education, support and counseling about physical activity/exercise needs.;Develop an individualized exercise prescription for aerobic and resistive training based on initial evaluation findings, risk stratification, comorbidities and participant's personal goals.       Expected Outcomes  Short Term: Attend rehab on a regular basis to increase amount of physical activity.;Long Term: Add in home exercise to make exercise part of routine and to increase amount of physical activity.;Long Term: Exercising regularly at least 3-5 days a week.       Increase Strength and Stamina  Yes       Intervention  Provide advice, education, support and counseling about physical activity/exercise needs.;Develop an individualized exercise prescription for aerobic and resistive training based on initial evaluation findings, risk stratification, comorbidities and participant's personal goals.       Expected Outcomes  Short Term: Increase workloads from initial exercise prescription for resistance, speed, and METs.;Short Term: Perform resistance training exercises routinely during rehab and add in resistance training at home;Long Term: Improve cardiorespiratory fitness, muscular endurance and strength as measured by increased METs and functional capacity (6MWT)       Able to understand and use rate of perceived exertion (RPE) scale  Yes       Intervention  Provide education and explanation on how to use RPE scale       Expected Outcomes  Short Term: Able to use RPE daily in rehab to express subjective intensity level;Long Term:  Able to use RPE to guide intensity level when exercising independently       Able  to understand and use Dyspnea scale  Yes       Intervention  Provide education and explanation on how to use Dyspnea scale       Expected Outcomes  Short Term: Able to use Dyspnea scale daily in rehab to express subjective sense of shortness of breath during exertion;Long Term: Able to use Dyspnea scale to guide intensity level when exercising independently       Knowledge and understanding of Target Heart Rate Range (THRR)  Yes       Intervention  Provide education and explanation of THRR including how the numbers were predicted and where they are located for reference       Expected Outcomes  Short Term: Able to state/look up THRR;Long Term: Able to use THRR to govern intensity when exercising independently;Short Term: Able to use daily as guideline for intensity in rehab       Able to check pulse independently  Yes       Intervention  Provide education and demonstration on how to check pulse in carotid and radial arteries.;Review the importance of being able to check your own pulse for safety during independent exercise       Expected Outcomes  Short Term: Able to explain why pulse checking is important during independent exercise;Long Term: Able to check pulse independently and accurately       Understanding of Exercise Prescription  Yes       Intervention  Provide education,  explanation, and written materials on patient's individual exercise prescription       Expected Outcomes  Short Term: Able to explain program exercise prescription;Long Term: Able to explain home exercise prescription to exercise independently          Exercise Goals Re-Evaluation : Exercise Goals Re-Evaluation    Row Name 11/09/19 3664 11/27/19 1035 12/09/19 1457 12/10/19 1041       Exercise Goal Re-Evaluation   Exercise Goals Review  Able to understand and use rate of perceived exertion (RPE) scale;Able to understand and use Dyspnea scale;Able to check pulse independently;Understanding of Exercise Prescription   Increase Physical Activity;Increase Strength and Stamina;Understanding of Exercise Prescription  Increase Physical Activity;Increase Strength and Stamina;Able to understand and use rate of perceived exertion (RPE) scale;Able to understand and use Dyspnea scale;Able to check pulse independently;Knowledge and understanding of Target Heart Rate Range (THRR);Understanding of Exercise Prescription  Increase Physical Activity;Increase Strength and Stamina;Understanding of Exercise Prescription    Comments  Reviewed RPE scale, THR and program prescription with pt today.  Pt voiced understanding and was given a copy of goals to take home.  Asad is off to a good start in rehab. He tried the treadmill and elliptical yesterday.  He did well on the treadmill, but the elliptical was challenging.  He also complained of some sciattica and hip pain. 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  We talked with him about stretching and strengthening his hip to help avoid surgery.  We also recommended a new PT consult to help as well.  He was open to all options.  We will continue to monitor his hip and progress.  Ron finds the elliptical very challenging.  He is doing short bouts on the elliptical  Ron is doing well in rehab.  He has not been doing much at home.  He usually stretches and walks around block for about 10 min. Reviewed home exercise with pt today.  Pt plans to walk and stretch at home for exercise.  Reviewed THR, pulse, RPE, sign and symptoms, NTG use, and when to call 911 or MD.  Also discussed weather considerations and indoor options.  Pt voiced understanding.    Expected Outcomes  Short: Use RPE daily to regulate intensity. Long: Follow program prescription in THR.  Short: Try stretching and strengthen to support hip.  Long: Continue to follow program prescription and attend regularly  Short - build endurance on elliptical Long:  improve overall stamina  Short: Increase walking time  at home. Long: Continue to improve stamina.       Discharge Exercise Prescription (Final Exercise Prescription Changes): Exercise Prescription Changes - 12/09/19 1400      Response to Exercise   Blood Pressure (Admit)  138/70    Blood Pressure (Exercise)  138/82    Blood Pressure (Exit)  90/60    Heart Rate (Admit)  114 bpm    Heart Rate (Exercise)  140 bpm    Heart Rate (Exit)  97 bpm    Rating of Perceived Exertion (Exercise)  17    Duration  Continue with 30 min of aerobic exercise without signs/symptoms of physical distress.    Intensity  THRR unchanged      Progression   Progression  Continue to progress workloads to maintain intensity without signs/symptoms of physical distress.    Average METs  2.7      Resistance Training   Training Prescription  Yes    Weight  3 lb    Reps  10-15      Interval Training   Interval Training  No      Treadmill   MPH  2.2    Grade  0    Minutes  15    METs  2.69      Elliptical   Level  1    Speed  2.5    Minutes  10   short bouts as tolerated      Nutrition:  Target Goals: Understanding of nutrition guidelines, daily intake of sodium '1500mg'$ , cholesterol '200mg'$ , calories 30% from fat and 7% or less from saturated fats, daily to have 5 or more servings of fruits and vegetables.  Biometrics: Pre Biometrics - 11/04/19 1631      Pre Biometrics   Height  6' 1.5" (1.867 m)    Weight  221 lb 14.4 oz (100.7 kg)    BMI (Calculated)  28.88    Single Leg Stand  30 seconds        Nutrition Therapy Plan and Nutrition Goals:   Nutrition Assessments:   Nutrition Goals Re-Evaluation:   Nutrition Goals Discharge (Final Nutrition Goals Re-Evaluation):   Psychosocial: Target Goals: Acknowledge presence or absence of significant depression and/or stress, maximize coping skills, provide positive support system. Participant is able to verbalize types and ability to use techniques and skills needed for reducing stress and  depression.   Initial Review & Psychosocial Screening:   Quality of Life Scores:  Quality of Life - 11/04/19 1621      Quality of Life   Select  Quality of Life      Quality of Life Scores   Health/Function Pre  11.6 %    Socioeconomic Pre  25.5 %    Psych/Spiritual Pre  26.57 %    Family Pre  27.6 %    GLOBAL Pre  20.06 %      Scores of 19 and below usually indicate a poorer quality of life in these areas.  A difference of  2-3 points is a clinically meaningful difference.  A difference of 2-3 points in the total score of the Quality of Life Index has been associated with significant improvement in overall quality of life, self-image, physical symptoms, and general health in studies assessing change in quality of life.  PHQ-9: Recent Review Flowsheet Data    Depression screen [facility] 2/9 11/24/2019 05/26/2018 04/26/2014   Decreased Interest 0 0 0   Down, Depressed, Hopeless 0 0 0   PHQ - 2 Score 0 0 0     Interpretation of Total Score  Total Score Depression Severity:  1-4 = Minimal depression, 5-9 = Mild depression, 10-14 = Moderate depression, 15-19 = Moderately severe depression, 20-27 = Severe depression   Psychosocial Evaluation and Intervention:   Psychosocial Re-Evaluation: Psychosocial Re-Evaluation    Row Name 12/10/19 1044             Psychosocial Re-Evaluation   Current issues with  Current Stress Concerns       Comments  Ron is doing well in rehab.  His hips are his biggest limitations for exercise and mobility.  His health is his biggest stressor.  He gets at least 6 hours of sleep a night.       Expected Outcomes  Short: Continue to attend to build hip strength.  Long: COntinue to cope positively       Interventions  Encouraged to attend Cardiac Rehabilitation for the exercise  Continue Psychosocial Services   Follow up required by staff          Psychosocial Discharge (Final Psychosocial Re-Evaluation): Psychosocial Re-Evaluation - 12/10/19 1044       Psychosocial Re-Evaluation   Current issues with  Current Stress Concerns    Comments  Ron is doing well in rehab.  His hips are his biggest limitations for exercise and mobility.  His health is his biggest stressor.  He gets at least 6 hours of sleep a night.    Expected Outcomes  Short: Continue to attend to build hip strength.  Long: COntinue to cope positively    Interventions  Encouraged to attend Cardiac Rehabilitation for the exercise    Continue Psychosocial Services   Follow up required by staff       Vocational Rehabilitation: Provide vocational rehab assistance to qualifying candidates.   Vocational Rehab Evaluation & Intervention:   Education: Education Goals: Education classes will be provided on a variety of topics geared toward better understanding of heart health and risk factor modification. Participant will state understanding/return demonstration of topics presented as noted by education test scores.  Learning Barriers/Preferences: Learning Barriers/Preferences - 11/02/19 1607      Learning Barriers/Preferences   Learning Barriers  None    Learning Preferences  Individual Instruction       Education Topics:  AED/CPR: - Group verbal and written instruction with the use of models to demonstrate the basic use of the AED with the basic ABC's of resuscitation.   General Nutrition Guidelines/Fats and Fiber: -Group instruction provided by verbal, written material, models and posters to present the general guidelines for heart healthy nutrition. Gives an explanation and review of dietary fats and fiber.   Controlling Sodium/Reading Food Labels: -Group verbal and written material supporting the discussion of sodium use in heart healthy nutrition. Review and explanation with models, verbal and written materials for utilization of the food label.   Exercise Physiology & General Exercise Guidelines: - Group verbal and written instruction with models to review the  exercise physiology of the cardiovascular system and associated critical values. Provides general exercise guidelines with specific guidelines to those with heart or lung disease.    Aerobic Exercise & Resistance Training: - Gives group verbal and written instruction on the various components of exercise. Focuses on aerobic and resistive training programs and the benefits of this training and how to safely progress through these programs..   Flexibility, Balance, Mind/Body Relaxation: Provides group verbal/written instruction on the benefits of flexibility and balance training, including mind/body exercise modes such as yoga, pilates and tai chi.  Demonstration and skill practice provided.   Stress and Anxiety: - Provides group verbal and written instruction about the health risks of elevated stress and causes of high stress.  Discuss the correlation between heart/lung disease and anxiety and treatment options. Review healthy ways to manage with stress and anxiety.   Depression: - Provides group verbal and written instruction on the correlation between heart/lung disease and depressed mood, treatment options, and the stigmas associated with seeking treatment.   Anatomy & Physiology of the Heart: - Group verbal and written instruction and models provide basic cardiac anatomy and physiology, with the coronary electrical and arterial systems. Review of Valvular disease and Heart Failure   Cardiac Procedures: - Group verbal and written instruction to review commonly prescribed medications for heart disease. Reviews the medication, class of the drug, and side effects. Includes the steps to properly store meds and maintain the prescription regimen. (  beta blockers and nitrates)   Cardiac Medications I: - Group verbal and written instruction to review commonly prescribed medications for heart disease. Reviews the medication, class of the drug, and side effects. Includes the steps to properly  store meds and maintain the prescription regimen.   Cardiac Medications II: -Group verbal and written instruction to review commonly prescribed medications for heart disease. Reviews the medication, class of the drug, and side effects. (all other drug classes)    Go Sex-Intimacy & Heart Disease, Get SMART - Goal Setting: - Group verbal and written instruction through game format to discuss heart disease and the return to sexual intimacy. Provides group verbal and written material to discuss and apply goal setting through the application of the S.M.A.R.T. Method.   Other Matters of the Heart: - Provides group verbal, written materials and models to describe Stable Angina and Peripheral Artery. Includes description of the disease process and treatment options available to the cardiac patient.   Exercise & Equipment Safety: - Individual verbal instruction and demonstration of equipment use and safety with use of the equipment.   Infection Prevention: - Provides verbal and written material to individual with discussion of infection control including proper hand washing and proper equipment cleaning during exercise session.   Falls Prevention: - Provides verbal and written material to individual with discussion of falls prevention and safety.   Diabetes: - Individual verbal and written instruction to review signs/symptoms of diabetes, desired ranges of glucose level fasting, after meals and with exercise. Acknowledge that pre and post exercise glucose checks will be done for 3 sessions at entry of program.   Know Your Numbers and Risk Factors: -Group verbal and written instruction about important numbers in your health.  Discussion of what are risk factors and how they play a role in the disease process.  Review of Cholesterol, Blood Pressure, Diabetes, and BMI and the role they play in your overall health.   Sleep Hygiene: -Provides group verbal and written instruction about how  sleep can affect your health.  Define sleep hygiene, discuss sleep cycles and impact of sleep habits. Review good sleep hygiene tips.    Other: -Provides group and verbal instruction on various topics (see comments)   Knowledge Questionnaire Score: Knowledge Questionnaire Score - 11/04/19 1623      Knowledge Questionnaire Score   Pre Score  24/26       Core Components/Risk Factors/Patient Goals at Admission: Personal Goals and Risk Factors at Admission - 11/04/19 1623      Core Components/Risk Factors/Patient Goals on Admission   Diabetes  Yes    Intervention  Provide education about signs/symptoms and action to take for hypo/hyperglycemia.;Provide education about proper nutrition, including hydration, and aerobic/resistive exercise prescription along with prescribed medications to achieve blood glucose in normal ranges: Fasting glucose 65-99 mg/dL    Expected Outcomes  Short Term: Participant verbalizes understanding of the signs/symptoms and immediate care of hyper/hypoglycemia, proper foot care and importance of medication, aerobic/resistive exercise and nutrition plan for blood glucose control.;Long Term: Attainment of HbA1C < 7%.    Heart Failure  Yes    Intervention  Provide a combined exercise and nutrition program that is supplemented with education, support and counseling about heart failure. Directed toward relieving symptoms such as shortness of breath, decreased exercise tolerance, and extremity edema.    Expected Outcomes  Improve functional capacity of life;Short term: Attendance in program 2-3 days a week with increased exercise capacity. Reported lower sodium intake. Reported increased fruit and  vegetable intake. Reports medication compliance.;Short term: Daily weights obtained and reported for increase. Utilizing diuretic protocols set by physician.;Long term: Adoption of self-care skills and reduction of barriers for early signs and symptoms recognition and intervention  leading to self-care maintenance.    Hypertension  Yes    Intervention  Provide education on lifestyle modifcations including regular physical activity/exercise, weight management, moderate sodium restriction and increased consumption of fresh fruit, vegetables, and low fat dairy, alcohol moderation, and smoking cessation.;Monitor prescription use compliance.    Expected Outcomes  Short Term: Continued assessment and intervention until BP is < 140/35m HG in hypertensive participants. < 130/858mHG in hypertensive participants with diabetes, heart failure or chronic kidney disease.;Long Term: Maintenance of blood pressure at goal levels.    Lipids  Yes    Intervention  Provide education and support for participant on nutrition & aerobic/resistive exercise along with prescribed medications to achieve LDL '70mg'$ , HDL >'40mg'$ .    Expected Outcomes  Short Term: Participant states understanding of desired cholesterol values and is compliant with medications prescribed. Participant is following exercise prescription and nutrition guidelines.;Long Term: Cholesterol controlled with medications as prescribed, with individualized exercise RX and with personalized nutrition plan. Value goals: LDL < '70mg'$ , HDL > 40 mg.    Stress  Yes    Intervention  Offer individual and/or small group education and counseling on adjustment to heart disease, stress management and health-related lifestyle change. Teach and support self-help strategies.    Personal Goal Other  Yes    Personal Goal  STG- Participant wants to get back to feeling like he's in his [age]'snd breathing freely. LTG- Participant wants to be able to function better all around.    Intervention  Begin Cardiac Rehab program, learn about and implement heart healthy living,develop habit of exercising and taking care of self.    Expected Outcomes  Accomplishing the set at intake and setting new goals. Finding new normal and how to live comfortably with new diagnosis.        Core Components/Risk Factors/Patient Goals Review:  Goals and Risk Factor Review    Row Name 12/10/19 1046             Core Components/Risk Factors/Patient Goals Review   Personal Goals Review  Weight Management/Obesity;Hypertension;Heart Failure;Diabetes       Review  Ron is doing well.  His weight has been steady.  However he is starting to notice changes in his body composition.  His clothes are starting to fit differently.  Blood sugars have been up and down but are starting to regulate more.  He is also starting to watch his soduim levels.  He is just having some chest pressure but no other heart failure symptoms.       Expected Outcomes  Short: Continue to work on weight.  Long: Continue to monitor risk factors.          Core Components/Risk Factors/Patient Goals at Discharge (Final Review):  Goals and Risk Factor Review - 12/10/19 1046      Core Components/Risk Factors/Patient Goals Review   Personal Goals Review  Weight Management/Obesity;Hypertension;Heart Failure;Diabetes    Review  Ron is doing well.  His weight has been steady.  However he is starting to notice changes in his body composition.  His clothes are starting to fit differently.  Blood sugars have been up and down but are starting to regulate more.  He is also starting to watch his soduim levels.  He is just having some chest  pressure but no other heart failure symptoms.    Expected Outcomes  Short: Continue to work on weight.  Long: Continue to monitor risk factors.       ITP Comments: ITP Comments    Row Name 11/02/19 1553 11/04/19 1603 11/09/19 0925 11/18/19 0851 12/16/19 1228   ITP Comments  Virtual Eval/Intake Call/ EP Orientation Wed 11/04/19 at 1:30pm Diagnosis Documentation can be found in [facility] 10/26.  6 minute walk test completed today. Patient did have some chest tightness that he reports as a regular occurence that his Cardiologist is aware of and attributes to his low EF. Participant felt better  after rest and moving on to balance test and resistance training.  First full day of exercise!  Patient was oriented to gym and equipment including functions, settings, policies, and procedures.  Patient's individual exercise prescription and treatment plan were reviewed.  All starting workloads were established based on the results of the 6 minute walk test done at initial orientation visit.  The plan for exercise progression was also introduced and progression will be customized based on patient's performance and goals.  30 day review competed . ITP sent to Dr Emily Filbert for review, changes as needed and ITP approval signature  New to program  30 day review completed. ITP sent to Dr. Emily Filbert, Medical Director of Cardiac and Pulmonary Rehab. Continue with ITP unless changes are made by physician.  Department operating under reduced schedule until further notice by request from hospital leadership.      Comments: 30 day review

## 2019-12-24 ENCOUNTER — Other Ambulatory Visit: Payer: Self-pay

## 2019-12-24 ENCOUNTER — Encounter: Payer: Medicare Other | Attending: Internal Medicine | Admitting: *Deleted

## 2019-12-24 DIAGNOSIS — I110 Hypertensive heart disease with heart failure: Secondary | ICD-10-CM | POA: Insufficient documentation

## 2019-12-24 DIAGNOSIS — E119 Type 2 diabetes mellitus without complications: Secondary | ICD-10-CM | POA: Insufficient documentation

## 2019-12-24 DIAGNOSIS — I5022 Chronic systolic (congestive) heart failure: Secondary | ICD-10-CM | POA: Diagnosis not present

## 2019-12-24 DIAGNOSIS — Z79899 Other long term (current) drug therapy: Secondary | ICD-10-CM | POA: Insufficient documentation

## 2019-12-24 NOTE — Progress Notes (Signed)
Daily Session Note  Patient Details  Name: [name] MRN: [id number] Date of Birth: [birth year] Referring Provider:     Cardiac Rehab from 11/04/2019 in [facility] Cardiac and Pulmonary Rehab  Referring Provider  [name]      Encounter Date: 12/24/2019  Check In: Session Check In - 12/24/19 1144      Check-In   Supervising physician immediately available to respond to emergencies  See telemetry face sheet for immediately available ER MD    Location  [facility]-Cardiac & Pulmonary Rehab    Staff Present  Nada Maclachlan, BA, ACSM CEP, Exercise Physiologist;Paisli Silfies [name], RN BSN;Joseph Hood RCP,RRT,BSRT    Virtual Visit  No    Medication changes reported      No    Fall or balance concerns reported     No    Warm-up and Cool-down  Performed on first and last piece of equipment    Resistance Training Performed  Yes    VAD Patient?  No    PAD/SET Patient?  No      Pain Assessment   Currently in Pain?  No/denies          Social History   Tobacco Use  Smoking Status Never Smoker  Smokeless Tobacco Never Used    Goals Met:  Independence with exercise equipment Exercise tolerated well No report of cardiac concerns or symptoms Strength training completed today  Goals Unmet:  Not Applicable  Comments: Pt able to follow exercise prescription today without complaint.  Will continue to monitor for progression.    Dr. Emily Filbert is Medical Director for [facility] and [facility].

## 2019-12-25 ENCOUNTER — Other Ambulatory Visit: Payer: Self-pay

## 2019-12-25 ENCOUNTER — Ambulatory Visit (INDEPENDENT_AMBULATORY_CARE_PROVIDER_SITE_OTHER): Payer: Medicare Other

## 2019-12-25 DIAGNOSIS — I428 Other cardiomyopathies: Secondary | ICD-10-CM | POA: Diagnosis not present

## 2019-12-29 ENCOUNTER — Other Ambulatory Visit: Payer: Self-pay

## 2019-12-29 ENCOUNTER — Encounter: Payer: Medicare Other | Admitting: *Deleted

## 2019-12-29 DIAGNOSIS — E119 Type 2 diabetes mellitus without complications: Secondary | ICD-10-CM | POA: Diagnosis not present

## 2019-12-29 DIAGNOSIS — I5022 Chronic systolic (congestive) heart failure: Secondary | ICD-10-CM

## 2019-12-29 DIAGNOSIS — I110 Hypertensive heart disease with heart failure: Secondary | ICD-10-CM | POA: Diagnosis not present

## 2019-12-29 DIAGNOSIS — Z79899 Other long term (current) drug therapy: Secondary | ICD-10-CM | POA: Diagnosis not present

## 2019-12-29 NOTE — Progress Notes (Signed)
Daily Session Note  Patient Details  Name: [name] MRN: [id number] Date of Birth: [birth year] Referring Provider:     Cardiac Rehab from 11/04/2019 in [facility] Cardiac and Pulmonary Rehab  Referring Provider  [name]      Encounter Date: 12/29/2019  Check In: Session Check In - 12/29/19 1020      Check-In   Supervising physician immediately available to respond to emergencies  See telemetry face sheet for immediately available ER MD    Location  [facility]-Cardiac & Pulmonary Rehab    Staff Present  Heath Lark, RN, BSN, CCRP;Melissa Old Jefferson RDN, LDN;Jessica Ridgeland, MA, RCEP, CCRP, CCET    Virtual Visit  No    Medication changes reported      No    Fall or balance concerns reported     No    Warm-up and Cool-down  Performed on first and last piece of equipment    Resistance Training Performed  Yes    VAD Patient?  No    PAD/SET Patient?  No      Pain Assessment   Currently in Pain?  No/denies          Social History   Tobacco Use  Smoking Status Never Smoker  Smokeless Tobacco Never Used    Goals Met:  Independence with exercise equipment Exercise tolerated well No report of cardiac concerns or symptoms  Goals Unmet:  Not Applicable  Comments: Pt able to follow exercise prescription today without complaint.  Will continue to monitor for progression.    Dr. Emily Filbert is Medical Director for [facility] and [facility].

## 2019-12-31 ENCOUNTER — Telehealth: Payer: Self-pay | Admitting: *Deleted

## 2019-12-31 ENCOUNTER — Other Ambulatory Visit: Payer: Self-pay

## 2019-12-31 ENCOUNTER — Encounter: Payer: Medicare Other | Admitting: *Deleted

## 2019-12-31 DIAGNOSIS — E119 Type 2 diabetes mellitus without complications: Secondary | ICD-10-CM | POA: Diagnosis not present

## 2019-12-31 DIAGNOSIS — I5022 Chronic systolic (congestive) heart failure: Secondary | ICD-10-CM | POA: Diagnosis not present

## 2019-12-31 DIAGNOSIS — I110 Hypertensive heart disease with heart failure: Secondary | ICD-10-CM | POA: Diagnosis not present

## 2019-12-31 DIAGNOSIS — Z79899 Other long term (current) drug therapy: Secondary | ICD-10-CM | POA: Diagnosis not present

## 2019-12-31 NOTE — Progress Notes (Signed)
Daily Session Note  Patient Details  Name: [name] MRN: [id number] Date of Birth: 09/15/1976 Referring Provider:     Cardiac Rehab from 11/04/2019 in [facility] Cardiac and Pulmonary Rehab  Referring Provider  Christopher End      Encounter Date: 12/31/2019  Check In: Session Check In - 12/31/19 1038      Check-In   Supervising physician immediately available to respond to emergencies  See telemetry face sheet for immediately available ER MD    Location  [facility]-Cardiac & Pulmonary Rehab    Staff Present  Melissa Caiola RDN, LDN;Jessica Hawkins, MA, RCEP, CCRP, CCET;Meredith Craven, RN BSN;Joseph Hood RCP,RRT,BSRT    Virtual Visit  No    Medication changes reported      No    Fall or balance concerns reported     No    Resistance Training Performed  Yes    VAD Patient?  No    PAD/SET Patient?  No      Pain Assessment   Currently in Pain?  No/denies          Social History   Tobacco Use  Smoking Status Never Smoker  Smokeless Tobacco Never Used    Goals Met:  Independence with exercise equipment Exercise tolerated well No report of cardiac concerns or symptoms Strength training completed today  Goals Unmet:  Not Applicable  Comments: Pt able to follow exercise prescription today without complaint.  Will continue to monitor for progression.    Dr. Mark Miller is Medical Director for [facility] and [facility]. 

## 2019-12-31 NOTE — Telephone Encounter (Signed)
While calling patient to give echo results, patient said the scale at Cardiac Rehab was 5 pounds more than when he was there on Tuesday.  Denies shortness of breath, fullness or swelling. He did not weigh himself this morning at home. Advised him to continue with daily weights tomorrow and see if weight is stable and monitor for symptoms. Advised he could take 1 extra Torsemide today when he gets home but to call us if no improvement by tomorrow. He verbalized understanding.

## 2020-01-05 ENCOUNTER — Other Ambulatory Visit: Payer: Self-pay

## 2020-01-05 ENCOUNTER — Encounter: Payer: Medicare Other | Admitting: *Deleted

## 2020-01-05 DIAGNOSIS — E119 Type 2 diabetes mellitus without complications: Secondary | ICD-10-CM | POA: Diagnosis not present

## 2020-01-05 DIAGNOSIS — I110 Hypertensive heart disease with heart failure: Secondary | ICD-10-CM | POA: Diagnosis not present

## 2020-01-05 DIAGNOSIS — Z79899 Other long term (current) drug therapy: Secondary | ICD-10-CM | POA: Diagnosis not present

## 2020-01-05 DIAGNOSIS — I5022 Chronic systolic (congestive) heart failure: Secondary | ICD-10-CM | POA: Diagnosis not present

## 2020-01-05 NOTE — Progress Notes (Signed)
Daily Session Note  Patient Details  Name: [name] MRN: [id number] Date of Birth: 1976/08/19 Referring Provider:     Cardiac Rehab from 11/04/2019 in [facility] Cardiac and Pulmonary Rehab  Referring Provider  [name]      Encounter Date: 01/05/2020  Check In: Session Check In - 01/05/20 1018      Check-In   Supervising physician immediately available to respond to emergencies  See telemetry face sheet for immediately available ER MD    Location  [facility]-Cardiac & Pulmonary Rehab    Staff Present  Heath Lark, RN, BSN, CCRP;Jessica Fairbury, MA, RCEP, CCRP, [employer], [place], ACSM CEP, Exercise Physiologist    Virtual Visit  No    Medication changes reported      No    Fall or balance concerns reported     No    Warm-up and Cool-down  Performed on first and last piece of equipment    Resistance Training Performed  Yes    VAD Patient?  No    PAD/SET Patient?  No      Pain Assessment   Currently in Pain?  No/denies          Social History   Tobacco Use  Smoking Status Never Smoker  Smokeless Tobacco Never Used    Goals Met:  Independence with exercise equipment Exercise tolerated well No report of cardiac concerns or symptoms  Goals Unmet:  Not Applicable  Comments: Pt able to follow exercise prescription today without complaint.  Will continue to monitor for progression.    Dr. Emily Filbert is Medical Director for [facility] and [facility].

## 2020-01-13 ENCOUNTER — Encounter: Payer: Self-pay | Admitting: *Deleted

## 2020-01-13 DIAGNOSIS — I5022 Chronic systolic (congestive) heart failure: Secondary | ICD-10-CM

## 2020-01-13 NOTE — Progress Notes (Signed)
Cardiac Individual Treatment Plan  Patient Details  Name: [name] MRN: [id number] Date of Birth: 1976/03/24 Referring Provider:     Cardiac Rehab from 11/04/2019 in [facility] Cardiac and Pulmonary Rehab  Referring Provider  [name]      Initial Encounter Date:    Cardiac Rehab from 11/04/2019 in [facility] Cardiac and Pulmonary Rehab  Date  11/04/19      Visit Diagnosis: Heart failure, chronic systolic (HCC)  Patient's Home Medications on Admission:  Current Outpatient Medications:  .  atorvastatin (LIPITOR) 40 MG tablet, Take 1 tablet (40 mg total) by mouth daily at 6 PM., Disp: 30 tablet, Rfl: 2 .  Blood Pressure Monitoring (BLOOD PRESSURE MONITOR/L CUFF) MISC, 1 Units by Does not apply route as needed., Disp: 1 each, Rfl: 0 .  carvedilol (COREG) 3.125 MG tablet, Take 1 tablet (3.125 mg total) by mouth 2 (two) times daily with a meal., Disp: 60 tablet, Rfl: 3 .  DULoxetine (CYMBALTA) 20 MG capsule, Take 2 capsules by mouth daily., Disp: , Rfl:  .  magnesium oxide (MAG-OX) 400 (241.3 Mg) MG tablet, Take 1 tablet (400 mg total) by mouth daily. (Patient not taking: Reported on 12/14/2019), Disp: 90 tablet, Rfl: 1 .  NOVOLIN 70/30 FLEXPEN (70-30) 100 UNIT/ML PEN, Inject into the skin 2 (two) times daily. Sliding scale, Disp: , Rfl:  .  potassium chloride SA (KLOR-CON) 20 MEQ tablet, Take 1 tablet (20 mEq total) by mouth 2 (two) times daily., Disp: 60 tablet, Rfl: 2 .  rOPINIRole (REQUIP) 0.25 MG tablet, Take 1 tablet by mouth 3 (three) times daily., Disp: , Rfl:  .  sacubitril-valsartan (ENTRESTO) 24-26 MG, Take 1 tablet by mouth 2 (two) times daily., Disp: 60 tablet, Rfl: 3 .  torsemide (DEMADEX) 20 MG tablet, Take 1 tablet (20 mg total) by mouth daily., Disp: , Rfl:   Past Medical History: Past Medical History:  Diagnosis Date  . CHF (congestive heart failure) ([place])   . CKD (chronic kidney disease), stage III   . Diabetes mellitus   . ED (erectile dysfunction)   .  Hypertension     Tobacco Use: Social History   Tobacco Use  Smoking Status Never Smoker  Smokeless Tobacco Never Used    Labs: Recent Review Flowsheet Data    Labs for ITP Cardiac and Pulmonary Rehab Latest Ref Rng & Units 04/25/2011 02/09/2012 05/26/2018 09/14/2019 09/15/2019   Cholestrol 0 - 200 mg/dL - - 177 - 118   LDLCALC 0 - 99 mg/dL - - 104(H) - 60   HDL >40 mg/dL - - 57.00 - 48   Trlycerides <150 mg/dL - - 81.0 - 49   Hemoglobin A1c 4.8 - 5.6 % - - 13.4(A) 7.4(H) 7.5(H)   TCO2 0 - 100 mmol/L 30 29 - - -       Exercise Target Goals: Exercise Program Goal: Individual exercise prescription set using results from initial 6 min walk test and THRR while considering  patient's activity barriers and safety.   Exercise Prescription Goal: Initial exercise prescription builds to 30-45 minutes a day of aerobic activity, 2-3 days per week.  Home exercise guidelines will be given to patient during program as part of exercise prescription that the participant will acknowledge.  Activity Barriers & Risk Stratification: Activity Barriers & Cardiac Risk Stratification - 11/02/19 1556      Activity Barriers & Cardiac Risk Stratification   Activity Barriers  Arthritis;Back Problems;Shortness of Breath;Assistive Device       6 Minute  Walk: 6 Minute Walk    Row Name 11/04/19 1605         6 Minute Walk   Phase  Initial     Distance  1242 feet     Walk Time  6 minutes     # of Rest Breaks  0     MPH  2.35     METS  4.4     RPE  12     VO2 Peak  15.41     Symptoms  Yes (comment)     Comments  6 minute walk test completed today. Patient did have some chest tightness that he reports as a regular occurence that his Cardiologist is aware of and attributes to his low EF. Participant felt better after rest and moving on to balance test and resistance training.     Resting HR  74 bpm     Resting BP  108/84     Exercise Oxygen Saturation  during 6 min walk  99 %     Max Ex. HR  122 bpm      Max Ex. BP  128/80     2 Minute Post BP  110/80        Oxygen Initial Assessment:   Oxygen Re-Evaluation:   Oxygen Discharge (Final Oxygen Re-Evaluation):   Initial Exercise Prescription: Initial Exercise Prescription - 11/04/19 1600      Date of Initial Exercise RX and Referring Provider   Date  11/04/19    Referring Provider  [name]      Treadmill   MPH  2.2    Grade  0    Minutes  15    METs  2.5      Recumbant Bike   Level  2    RPM  60    Watts  20    Minutes  15    METs  2.5      T5 Nustep   Level  2    SPM  60    Minutes  15    METs  2      Biostep-RELP   Level  2    SPM  50    Minutes  15    METs  2      Prescription Details   Frequency (times per week)  3    Duration  Progress to 30 minutes of continuous aerobic without signs/symptoms of physical distress      Intensity   THRR 40-80% of Max Heartrate  114-155    Ratings of Perceived Exertion  11-15    Perceived Dyspnea  0-4      Progression   Progression  Continue progressive overload as per policy without signs/symptoms or physical distress.      Resistance Training   Training Prescription  Yes    Weight  3    Reps  10-15       Perform Capillary Blood Glucose checks as needed.  Exercise Prescription Changes: Exercise Prescription Changes    Row Name 11/10/19 1400 11/27/19 1000 12/09/19 1400 12/23/19 1200 01/05/20 1300     Response to Exercise   Blood Pressure (Admit)  146/64  100/64  138/70  128/84  134/70   Blood Pressure (Exercise)  --  112/60  138/82  152/76  136/64   Blood Pressure (Exit)  120/70  94/60  90/60  130/70  116/60   Heart Rate (Admit)  85 bpm  87 bpm  114 bpm  78 bpm  97 bpm   Heart Rate (Exercise)  112 bpm  113 bpm  140 bpm  124 bpm  121 bpm   Heart Rate (Exit)  93 bpm  89 bpm  97 bpm  110 bpm  105 bpm   Rating of Perceived Exertion (Exercise)  '14  17  17  13  13   '$ Symptoms  none  fatigue on elliptical  --  hip pain on elliptical  --   Comments  1st  day  --  --  --  --   Duration  Progress to 30 minutes of  aerobic without signs/symptoms of physical distress  Continue with 30 min of aerobic exercise without signs/symptoms of physical distress.  Continue with 30 min of aerobic exercise without signs/symptoms of physical distress.  Continue with 30 min of aerobic exercise without signs/symptoms of physical distress.  Continue with 30 min of aerobic exercise without signs/symptoms of physical distress.   Intensity  THRR unchanged  THRR unchanged  THRR unchanged  THRR unchanged  THRR unchanged     Progression   Progression  Continue to progress workloads to maintain intensity without signs/symptoms of physical distress.  Continue to progress workloads to maintain intensity without signs/symptoms of physical distress.  Continue to progress workloads to maintain intensity without signs/symptoms of physical distress.  Continue to progress workloads to maintain intensity without signs/symptoms of physical distress.  Continue to progress workloads to maintain intensity without signs/symptoms of physical distress.   Average METs  2.7  2.91  2.7  2.94  3.25     Resistance Training   Training Prescription  Yes  Yes  Yes  Yes  Yes   Weight  3 lb  3 lb  3 lb  3 lb  8 lb   Reps  10-15  10-15  10-15  10-15  10-15     Interval Training   Interval Training  --  No  No  No  No     Treadmill   MPH  --  3  2.2  2.2  2.3   Grade  --  1  0  0  0   Minutes  --  '15  15  15  15   '$ METs  --  3.71  2.69  2.68  2.69     Recumbant Bike   Level  1  --  --  7  --   RPM  60  --  --  --  --   Watts  25  --  --  40  --   Minutes  15  --  --  15  --   METs  2.8  --  --  3.26  --     NuStep   Level  2  --  --  5  --   SPM  80  --  --  --  --   Minutes  15  --  --  15  --   METs  2.1  --  --  2.9  --     Elliptical   Level  --  1  1  --  --   Speed  --  2.5  2.5  --  --   Minutes  --  10 2-5 min bouts  10 short bouts as tolerated  --  --     REL-XR   Level   --  --  --  --  4   Minutes  --  --  --  --  15   METs  --  --  --  --  3.8     T5 Nustep   Level  --  2  --  --  --   Minutes  --  15  --  --  --   METs  --  2.1  --  --  --     Home Exercise Plan   Plans to continue exercise at  --  --  --  Home (comment) walking  --   Frequency  --  --  --  Add 3 additional days to program exercise sessions.  --   Initial Home Exercises Provided  --  --  --  12/10/19  --      Exercise Comments: Exercise Comments    Row Name 11/09/19 0925 11/26/19 1049         Exercise Comments  First full day of exercise!  Patient was oriented to gym and equipment including functions, settings, policies, and procedures.  Patient's individual exercise prescription and treatment plan were reviewed.  All starting workloads were established based on the results of the 6 minute walk test done at initial orientation visit.  The plan for exercise progression was also introduced and progression will be customized based on patient's performance and goals.  Rons sciatica has been bothering him.  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  Staff showed Ron stretches (modified figure 4 )  and recommended PT consult in the meantime.         Exercise Goals and Review: Exercise Goals    Row Name 11/04/19 1627             Exercise Goals   Increase Physical Activity  Yes       Intervention  Provide advice, education, support and counseling about physical activity/exercise needs.;Develop an individualized exercise prescription for aerobic and resistive training based on initial evaluation findings, risk stratification, comorbidities and participant's personal goals.       Expected Outcomes  Short Term: Attend rehab on a regular basis to increase amount of physical activity.;Long Term: Add in home exercise to make exercise part of routine and to increase amount of physical activity.;Long Term: Exercising regularly at least 3-5 days a  week.       Increase Strength and Stamina  Yes       Intervention  Provide advice, education, support and counseling about physical activity/exercise needs.;Develop an individualized exercise prescription for aerobic and resistive training based on initial evaluation findings, risk stratification, comorbidities and participant's personal goals.       Expected Outcomes  Short Term: Increase workloads from initial exercise prescription for resistance, speed, and METs.;Short Term: Perform resistance training exercises routinely during rehab and add in resistance training at home;Long Term: Improve cardiorespiratory fitness, muscular endurance and strength as measured by increased METs and functional capacity (6MWT)       Able to understand and use rate of perceived exertion (RPE) scale  Yes       Intervention  Provide education and explanation on how to use RPE scale       Expected Outcomes  Short Term: Able to use RPE daily in rehab to express subjective intensity level;Long Term:  Able to use RPE to guide intensity level when exercising independently       Able to understand and use Dyspnea scale  Yes       Intervention  Provide education and  explanation on how to use Dyspnea scale       Expected Outcomes  Short Term: Able to use Dyspnea scale daily in rehab to express subjective sense of shortness of breath during exertion;Long Term: Able to use Dyspnea scale to guide intensity level when exercising independently       Knowledge and understanding of Target Heart Rate Range (THRR)  Yes       Intervention  Provide education and explanation of THRR including how the numbers were predicted and where they are located for reference       Expected Outcomes  Short Term: Able to state/look up THRR;Long Term: Able to use THRR to govern intensity when exercising independently;Short Term: Able to use daily as guideline for intensity in rehab       Able to check pulse independently  Yes       Intervention  Provide  education and demonstration on how to check pulse in carotid and radial arteries.;Review the importance of being able to check your own pulse for safety during independent exercise       Expected Outcomes  Short Term: Able to explain why pulse checking is important during independent exercise;Long Term: Able to check pulse independently and accurately       Understanding of Exercise Prescription  Yes       Intervention  Provide education, explanation, and written materials on patient's individual exercise prescription       Expected Outcomes  Short Term: Able to explain program exercise prescription;Long Term: Able to explain home exercise prescription to exercise independently          Exercise Goals Re-Evaluation : Exercise Goals Re-Evaluation    Row Name 11/09/19 7341 11/27/19 1035 12/09/19 1457 12/10/19 1041 12/23/19 1211     Exercise Goal Re-Evaluation   Exercise Goals Review  Able to understand and use rate of perceived exertion (RPE) scale;Able to understand and use Dyspnea scale;Able to check pulse independently;Understanding of Exercise Prescription  Increase Physical Activity;Increase Strength and Stamina;Understanding of Exercise Prescription  Increase Physical Activity;Increase Strength and Stamina;Able to understand and use rate of perceived exertion (RPE) scale;Able to understand and use Dyspnea scale;Able to check pulse independently;Knowledge and understanding of Target Heart Rate Range (THRR);Understanding of Exercise Prescription  Increase Physical Activity;Increase Strength and Stamina;Understanding of Exercise Prescription  Increase Physical Activity;Increase Strength and Stamina;Understanding of Exercise Prescription   Comments  Reviewed RPE scale, THR and program prescription with pt today.  Pt voiced understanding and was given a copy of goals to take home.  Carnel is off to a good start in rehab. He tried the treadmill and elliptical yesterday.  He did well on the treadmill, but  the elliptical was challenging.  He also complained of some sciattica and hip pain. 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  We talked with him about stretching and strengthening his hip to help avoid surgery.  We also recommended a new PT consult to help as well.  He was open to all options.  We will continue to monitor his hip and progress.  Ron finds the elliptical very challenging.  He is doing short bouts on the elliptical  Ron is doing well in rehab.  He has not been doing much at home.  He usually stretches and walks around block for about 10 min. Reviewed home exercise with pt today.  Pt plans to walk and stretch at home for exercise.  Reviewed THR, pulse, RPE, sign  and symptoms, NTG use, and when to call 911 or MD.  Also discussed weather considerations and indoor options.  Pt voiced understanding.  Ron has been doing well in rehab.  He was able to do well on the bike and we have moved him off the elliptical as it really aggravated his hip.  We will continue to montior his progress.   Expected Outcomes  Short: Use RPE daily to regulate intensity. Long: Follow program prescription in THR.  Short: Try stretching and strengthen to support hip.  Long: Continue to follow program prescription and attend regularly  Short - build endurance on elliptical Long:  improve overall stamina  Short: Increase walking time at home. Long: Continue to improve stamina.  Short: Increase workload on treadmill.  Long: Continue to add in exercise at home.   Rochelle Name 12/29/19 1031 01/05/20 1308           Exercise Goal Re-Evaluation   Exercise Goals Review  Increase Physical Activity;Increase Strength and Stamina;Understanding of Exercise Prescription  Increase Physical Activity;Increase Strength and Stamina;Able to understand and use rate of perceived exertion (RPE) scale;Able to understand and use Dyspnea scale;Knowledge and understanding of Target Heart Rate Range (THRR);Able to check  pulse independently;Understanding of Exercise Prescription      Comments  Weighted sit-ups 5 on each arm - will do 20 minutes 3x/week. Does laps around the store and other unstructured exercise. Pt reports walking on road hurts his feet and he is fatigued and his body is hurting (leg and back).  Ron has increased to 8 ob weights fro strength work and lincreased TM speed.  He is exercsising some at home.  Staff will monitor progress.      Expected Outcomes  Short: Increase workload on treadmill.  Long: Continue to add in exercise at home.  --         Discharge Exercise Prescription (Final Exercise Prescription Changes): Exercise Prescription Changes - 01/05/20 1300      Response to Exercise   Blood Pressure (Admit)  134/70    Blood Pressure (Exercise)  136/64    Blood Pressure (Exit)  116/60    Heart Rate (Admit)  97 bpm    Heart Rate (Exercise)  121 bpm    Heart Rate (Exit)  105 bpm    Rating of Perceived Exertion (Exercise)  13    Duration  Continue with 30 min of aerobic exercise without signs/symptoms of physical distress.    Intensity  THRR unchanged      Progression   Progression  Continue to progress workloads to maintain intensity without signs/symptoms of physical distress.    Average METs  3.25      Resistance Training   Training Prescription  Yes    Weight  8 lb    Reps  10-15      Interval Training   Interval Training  No      Treadmill   MPH  2.3    Grade  0    Minutes  15    METs  2.69      REL-XR   Level  4    Minutes  15    METs  3.8       Nutrition:  Target Goals: Understanding of nutrition guidelines, daily intake of sodium '1500mg'$ , cholesterol '200mg'$ , calories 30% from fat and 7% or less from saturated fats, daily to have 5 or more servings of fruits and vegetables.  Biometrics: Pre Biometrics - 11/04/19 1631  Pre Biometrics   Height  6' 1.5" (1.867 m)    Weight  221 lb 14.4 oz (100.7 kg)    BMI (Calculated)  28.88    Single Leg Stand  30  seconds        Nutrition Therapy Plan and Nutrition Goals:   Nutrition Assessments:   Nutrition Goals Re-Evaluation:   Nutrition Goals Discharge (Final Nutrition Goals Re-Evaluation):   Psychosocial: Target Goals: Acknowledge presence or absence of significant depression and/or stress, maximize coping skills, provide positive support system. Participant is able to verbalize types and ability to use techniques and skills needed for reducing stress and depression.   Initial Review & Psychosocial Screening:   Quality of Life Scores:  Quality of Life - 11/04/19 1621      Quality of Life   Select  Quality of Life      Quality of Life Scores   Health/Function Pre  11.6 %    Socioeconomic Pre  25.5 %    Psych/Spiritual Pre  26.57 %    Family Pre  27.6 %    GLOBAL Pre  20.06 %      Scores of 19 and below usually indicate a poorer quality of life in these areas.  A difference of  2-3 points is a clinically meaningful difference.  A difference of 2-3 points in the total score of the Quality of Life Index has been associated with significant improvement in overall quality of life, self-image, physical symptoms, and general health in studies assessing change in quality of life.  PHQ-9: Recent Review Flowsheet Data    Depression screen [facility] 2/9 11/24/2019 05/26/2018 04/26/2014   Decreased Interest 0 0 0   Down, Depressed, Hopeless 0 0 0   PHQ - 2 Score 0 0 0     Interpretation of Total Score  Total Score Depression Severity:  1-4 = Minimal depression, 5-9 = Mild depression, 10-14 = Moderate depression, 15-19 = Moderately severe depression, 20-27 = Severe depression   Psychosocial Evaluation and Intervention:   Psychosocial Re-Evaluation: Psychosocial Re-Evaluation    Row Name 12/10/19 1044 12/29/19 1035           Psychosocial Re-Evaluation   Current issues with  Current Stress Concerns  Current Stress Concerns      Comments  Ron is doing well in rehab.  His hips are his  biggest limitations for exercise and mobility.  His health is his biggest stressor.  He gets at least 6 hours of sleep a night.  His hips are his biggest limitations for exercise and mobility.  His health is his biggest stressor, but his health is not a "mental stressor". Pt reports wife is a good support system and that she will cook for him.  He gets at least 6 hours of sleep a night, body pain sometimes makes it hard to sleep; will get on average 8-9 hours of sleep with 2-3 interuptions from pain or bathroom.      Expected Outcomes  Short: Continue to attend to build hip strength.  Long: COntinue to cope positively  Short: Continue to attend to build hip strength.  Long: Continue to cope positively      Interventions  Encouraged to attend Cardiac Rehabilitation for the exercise  Encouraged to attend Cardiac Rehabilitation for the exercise      Continue Psychosocial Services   Follow up required by staff  Follow up required by staff         Psychosocial Discharge (Final Psychosocial Re-Evaluation): Psychosocial  Re-Evaluation - 12/29/19 1035      Psychosocial Re-Evaluation   Current issues with  Current Stress Concerns    Comments  His hips are his biggest limitations for exercise and mobility.  His health is his biggest stressor, but his health is not a "mental stressor". Pt reports wife is a good support system and that she will cook for him.  He gets at least 6 hours of sleep a night, body pain sometimes makes it hard to sleep; will get on average 8-9 hours of sleep with 2-3 interuptions from pain or bathroom.    Expected Outcomes  Short: Continue to attend to build hip strength.  Long: Continue to cope positively    Interventions  Encouraged to attend Cardiac Rehabilitation for the exercise    Continue Psychosocial Services   Follow up required by staff       Vocational Rehabilitation: Provide vocational rehab assistance to qualifying candidates.   Vocational Rehab Evaluation &  Intervention:   Education: Education Goals: Education classes will be provided on a variety of topics geared toward better understanding of heart health and risk factor modification. Participant will state understanding/return demonstration of topics presented as noted by education test scores.  Learning Barriers/Preferences: Learning Barriers/Preferences - 11/02/19 1607      Learning Barriers/Preferences   Learning Barriers  None    Learning Preferences  Individual Instruction       Education Topics:  AED/CPR: - Group verbal and written instruction with the use of models to demonstrate the basic use of the AED with the basic ABC's of resuscitation.   General Nutrition Guidelines/Fats and Fiber: -Group instruction provided by verbal, written material, models and posters to present the general guidelines for heart healthy nutrition. Gives an explanation and review of dietary fats and fiber.   Controlling Sodium/Reading Food Labels: -Group verbal and written material supporting the discussion of sodium use in heart healthy nutrition. Review and explanation with models, verbal and written materials for utilization of the food label.   Exercise Physiology & General Exercise Guidelines: - Group verbal and written instruction with models to review the exercise physiology of the cardiovascular system and associated critical values. Provides general exercise guidelines with specific guidelines to those with heart or lung disease.    Aerobic Exercise & Resistance Training: - Gives group verbal and written instruction on the various components of exercise. Focuses on aerobic and resistive training programs and the benefits of this training and how to safely progress through these programs..   Flexibility, Balance, Mind/Body Relaxation: Provides group verbal/written instruction on the benefits of flexibility and balance training, including mind/body exercise modes such as yoga, pilates and  tai chi.  Demonstration and skill practice provided.   Stress and Anxiety: - Provides group verbal and written instruction about the health risks of elevated stress and causes of high stress.  Discuss the correlation between heart/lung disease and anxiety and treatment options. Review healthy ways to manage with stress and anxiety.   Depression: - Provides group verbal and written instruction on the correlation between heart/lung disease and depressed mood, treatment options, and the stigmas associated with seeking treatment.   Anatomy & Physiology of the Heart: - Group verbal and written instruction and models provide basic cardiac anatomy and physiology, with the coronary electrical and arterial systems. Review of Valvular disease and Heart Failure   Cardiac Procedures: - Group verbal and written instruction to review commonly prescribed medications for heart disease. Reviews the medication, class of the drug, and  side effects. Includes the steps to properly store meds and maintain the prescription regimen. (beta blockers and nitrates)   Cardiac Medications I: - Group verbal and written instruction to review commonly prescribed medications for heart disease. Reviews the medication, class of the drug, and side effects. Includes the steps to properly store meds and maintain the prescription regimen.   Cardiac Medications II: -Group verbal and written instruction to review commonly prescribed medications for heart disease. Reviews the medication, class of the drug, and side effects. (all other drug classes)    Go Sex-Intimacy & Heart Disease, Get SMART - Goal Setting: - Group verbal and written instruction through game format to discuss heart disease and the return to sexual intimacy. Provides group verbal and written material to discuss and apply goal setting through the application of the S.M.A.R.T. Method.   Other Matters of the Heart: - Provides group verbal, written materials and  models to describe Stable Angina and Peripheral Artery. Includes description of the disease process and treatment options available to the cardiac patient.   Exercise & Equipment Safety: - Individual verbal instruction and demonstration of equipment use and safety with use of the equipment.   Infection Prevention: - Provides verbal and written material to individual with discussion of infection control including proper hand washing and proper equipment cleaning during exercise session.   Falls Prevention: - Provides verbal and written material to individual with discussion of falls prevention and safety.   Diabetes: - Individual verbal and written instruction to review signs/symptoms of diabetes, desired ranges of glucose level fasting, after meals and with exercise. Acknowledge that pre and post exercise glucose checks will be done for 3 sessions at entry of program.   Know Your Numbers and Risk Factors: -Group verbal and written instruction about important numbers in your health.  Discussion of what are risk factors and how they play a role in the disease process.  Review of Cholesterol, Blood Pressure, Diabetes, and BMI and the role they play in your overall health.   Sleep Hygiene: -Provides group verbal and written instruction about how sleep can affect your health.  Define sleep hygiene, discuss sleep cycles and impact of sleep habits. Review good sleep hygiene tips.    Other: -Provides group and verbal instruction on various topics (see comments)   Knowledge Questionnaire Score: Knowledge Questionnaire Score - 11/04/19 1623      Knowledge Questionnaire Score   Pre Score  24/26       Core Components/Risk Factors/Patient Goals at Admission: Personal Goals and Risk Factors at Admission - 11/04/19 1623      Core Components/Risk Factors/Patient Goals on Admission   Diabetes  Yes    Intervention  Provide education about signs/symptoms and action to take for  hypo/hyperglycemia.;Provide education about proper nutrition, including hydration, and aerobic/resistive exercise prescription along with prescribed medications to achieve blood glucose in normal ranges: Fasting glucose 65-99 mg/dL    Expected Outcomes  Short Term: Participant verbalizes understanding of the signs/symptoms and immediate care of hyper/hypoglycemia, proper foot care and importance of medication, aerobic/resistive exercise and nutrition plan for blood glucose control.;Long Term: Attainment of HbA1C < 7%.    Heart Failure  Yes    Intervention  Provide a combined exercise and nutrition program that is supplemented with education, support and counseling about heart failure. Directed toward relieving symptoms such as shortness of breath, decreased exercise tolerance, and extremity edema.    Expected Outcomes  Improve functional capacity of life;Short term: Attendance in program 2-3 days  a week with increased exercise capacity. Reported lower sodium intake. Reported increased fruit and vegetable intake. Reports medication compliance.;Short term: Daily weights obtained and reported for increase. Utilizing diuretic protocols set by physician.;Long term: Adoption of self-care skills and reduction of barriers for early signs and symptoms recognition and intervention leading to self-care maintenance.    Hypertension  Yes    Intervention  Provide education on lifestyle modifcations including regular physical activity/exercise, weight management, moderate sodium restriction and increased consumption of fresh fruit, vegetables, and low fat dairy, alcohol moderation, and smoking cessation.;Monitor prescription use compliance.    Expected Outcomes  Short Term: Continued assessment and intervention until BP is < 140/19m HG in hypertensive participants. < 130/816mHG in hypertensive participants with diabetes, heart failure or chronic kidney disease.;Long Term: Maintenance of blood pressure at goal levels.     Lipids  Yes    Intervention  Provide education and support for participant on nutrition & aerobic/resistive exercise along with prescribed medications to achieve LDL '70mg'$ , HDL >'40mg'$ .    Expected Outcomes  Short Term: Participant states understanding of desired cholesterol values and is compliant with medications prescribed. Participant is following exercise prescription and nutrition guidelines.;Long Term: Cholesterol controlled with medications as prescribed, with individualized exercise RX and with personalized nutrition plan. Value goals: LDL < '70mg'$ , HDL > 40 mg.    Stress  Yes    Intervention  Offer individual and/or small group education and counseling on adjustment to heart disease, stress management and health-related lifestyle change. Teach and support self-help strategies.    Personal Goal Other  Yes    Personal Goal  STG- Participant wants to get back to feeling like he's in his [age]'snd breathing freely. LTG- Participant wants to be able to function better all around.    Intervention  Begin Cardiac Rehab program, learn about and implement heart healthy living,develop habit of exercising and taking care of self.    Expected Outcomes  Accomplishing the set at intake and setting new goals. Finding new normal and how to live comfortably with new diagnosis.       Core Components/Risk Factors/Patient Goals Review:  Goals and Risk Factor Review    Row Name 12/10/19 1046 12/29/19 1039           Core Components/Risk Factors/Patient Goals Review   Personal Goals Review  Weight Management/Obesity;Hypertension;Heart Failure;Diabetes  Weight Management/Obesity;Hypertension;Heart Failure;Diabetes      Review  Ron is doing well.  His weight has been steady.  However he is starting to notice changes in his body composition.  His clothes are starting to fit differently.  Blood sugars have been up and down but are starting to regulate more.  He is also starting to watch his soduim levels.  He is just  having some chest pressure but no other heart failure symptoms.  Pt reports that his weight is stable but his clothes are now much looser and may need new clothes soon. He reports his BG is eratic; 300 yesterday and last night is was 99, bottomed out overnight and drank some juice, this morning it was 94. He reports eating consistently. On insulin, he says his MD is going to change meds to help with this. He is still feeling chest pressure and light headed if he gets up too quickly, but no other HF symptoms.      Expected Outcomes  Short: Continue to work on weight.  Long: Continue to monitor risk factors.  Short: Continue to work on strength Long: Continue  to monitor risk factors.         Core Components/Risk Factors/Patient Goals at Discharge (Final Review):  Goals and Risk Factor Review - 12/29/19 1039      Core Components/Risk Factors/Patient Goals Review   Personal Goals Review  Weight Management/Obesity;Hypertension;Heart Failure;Diabetes    Review  Pt reports that his weight is stable but his clothes are now much looser and may need new clothes soon. He reports his BG is eratic; 300 yesterday and last night is was 99, bottomed out overnight and drank some juice, this morning it was 94. He reports eating consistently. On insulin, he says his MD is going to change meds to help with this. He is still feeling chest pressure and light headed if he gets up too quickly, but no other HF symptoms.    Expected Outcomes  Short: Continue to work on strength Long: Continue to monitor risk factors.       ITP Comments: ITP Comments    Row Name 11/02/19 1553 11/04/19 1603 11/09/19 0925 11/18/19 0851 12/16/19 1228   ITP Comments  Virtual Eval/Intake Call/ EP Orientation Wed 11/04/19 at 1:30pm Diagnosis Documentation can be found in [facility] 10/26.  6 minute walk test completed today. Patient did have some chest tightness that he reports as a regular occurence that his Cardiologist is aware of and attributes to  his low EF. Participant felt better after rest and moving on to balance test and resistance training.  First full day of exercise!  Patient was oriented to gym and equipment including functions, settings, policies, and procedures.  Patient's individual exercise prescription and treatment plan were reviewed.  All starting workloads were established based on the results of the 6 minute walk test done at initial orientation visit.  The plan for exercise progression was also introduced and progression will be customized based on patient's performance and goals.  30 day review competed . ITP sent to Dr Emily Filbert for review, changes as needed and ITP approval signature  New to program  30 day review completed. ITP sent to Dr. Emily Filbert, Medical Director of Cardiac and Pulmonary Rehab. Continue with ITP unless changes are made by physician.  Department operating under reduced schedule until further notice by request from hospital leadership.   [place] Name 01/13/20 0629           ITP Comments  30 day chart review completed. ITP sent to Dr Zachery Dakins Medical Director, for review,changes as needed and signature.          Comments:

## 2020-01-14 ENCOUNTER — Other Ambulatory Visit: Payer: Self-pay

## 2020-01-14 ENCOUNTER — Encounter: Payer: Medicare Other | Admitting: *Deleted

## 2020-01-14 DIAGNOSIS — I110 Hypertensive heart disease with heart failure: Secondary | ICD-10-CM | POA: Diagnosis not present

## 2020-01-14 DIAGNOSIS — E119 Type 2 diabetes mellitus without complications: Secondary | ICD-10-CM | POA: Diagnosis not present

## 2020-01-14 DIAGNOSIS — I5022 Chronic systolic (congestive) heart failure: Secondary | ICD-10-CM

## 2020-01-14 DIAGNOSIS — Z79899 Other long term (current) drug therapy: Secondary | ICD-10-CM | POA: Diagnosis not present

## 2020-01-14 NOTE — Progress Notes (Signed)
Daily Session Note  Patient Details  Name: [name] MRN: [id number] Date of Birth: October 16, 1976 Referring Provider:     Cardiac Rehab from 11/04/2019 in [facility] Cardiac and Pulmonary Rehab  Referring Provider  [name]      Encounter Date: 01/14/2020  Check In: Session Check In - 01/14/20 1019      Check-In   Supervising physician immediately available to respond to emergencies  See telemetry face sheet for immediately available ER MD    Location  [facility]-Cardiac & Pulmonary Rehab    Staff Present  Heath Lark, RN, BSN, CCRP;Melissa Padre [place] RDN, LDN;Joseph [employer], [place], ACSM CEP, Exercise Physiologist    Virtual Visit  No    Medication changes reported      No    Fall or balance concerns reported     No    Warm-up and Cool-down  Performed on first and last piece of equipment    Resistance Training Performed  Yes    VAD Patient?  No    PAD/SET Patient?  No      Pain Assessment   Currently in Pain?  No/denies          Social History   Tobacco Use  Smoking Status Never Smoker  Smokeless Tobacco Never Used    Goals Met:  Independence with exercise equipment Exercise tolerated well No report of cardiac concerns or symptoms  Goals Unmet:  Not Applicable  Comments: Pt able to follow exercise prescription today without complaint.  Will continue to monitor for progression.    Dr. Emily Filbert is Medical Director for [place] and [facility].

## 2020-01-19 ENCOUNTER — Other Ambulatory Visit: Payer: Self-pay

## 2020-01-19 ENCOUNTER — Encounter: Payer: Medicare Other | Attending: Internal Medicine | Admitting: *Deleted

## 2020-01-19 DIAGNOSIS — E119 Type 2 diabetes mellitus without complications: Secondary | ICD-10-CM | POA: Insufficient documentation

## 2020-01-19 DIAGNOSIS — Z79899 Other long term (current) drug therapy: Secondary | ICD-10-CM | POA: Diagnosis not present

## 2020-01-19 DIAGNOSIS — I5022 Chronic systolic (congestive) heart failure: Secondary | ICD-10-CM

## 2020-01-19 DIAGNOSIS — I110 Hypertensive heart disease with heart failure: Secondary | ICD-10-CM | POA: Insufficient documentation

## 2020-01-19 NOTE — Progress Notes (Signed)
Daily Session Note  Patient Details  Name: [name] MRN: [id number] Date of Birth: [birth year] Referring Provider:     Cardiac Rehab from 11/04/2019 in [facility] Cardiac and Pulmonary Rehab  Referring Provider  [name]      Encounter Date: 01/19/2020  Check In: Session Check In - 01/19/20 1021      Check-In   Supervising physician immediately available to respond to emergencies  See telemetry face sheet for immediately available ER MD    Location  [facility]-Cardiac & Pulmonary Rehab    Staff Present  Heath Lark, RN, BSN, CCRP;Joseph Hood RCP,RRT,BSRT;Amanda Pitcairn, [place], ACSM CEP, Exercise Physiologist    Virtual Visit  No    Medication changes reported      No    Fall or balance concerns reported     No    Warm-up and Cool-down  Performed on first and last piece of equipment    Resistance Training Performed  Yes    VAD Patient?  No    PAD/SET Patient?  No      Pain Assessment   Currently in Pain?  No/denies          Social History   Tobacco Use  Smoking Status Never Smoker  Smokeless Tobacco Never Used    Goals Met:  Independence with exercise equipment Exercise tolerated well No report of cardiac concerns or symptoms  Goals Unmet:  Not Applicable  Comments: Pt able to follow exercise prescription today without complaint.  Will continue to monitor for progression. [name] is experiencing left shoulder discomfort. Thinks he pulled a muscle. Movement of shoulder creates discomfort. Advised to call MD if this continues or gets worse. Also advised not to use left arm to hard today. He verbalized understanding.  [name] stated he did fine today, no further discomfort while exercising.   Dr. Emily Filbert is Medical Director for [place] and [facility].

## 2020-01-28 ENCOUNTER — Encounter: Payer: Medicare Other | Admitting: *Deleted

## 2020-01-28 ENCOUNTER — Other Ambulatory Visit: Payer: Self-pay

## 2020-01-28 DIAGNOSIS — E1159 Type 2 diabetes mellitus with other circulatory complications: Secondary | ICD-10-CM | POA: Diagnosis not present

## 2020-01-28 DIAGNOSIS — E1142 Type 2 diabetes mellitus with diabetic polyneuropathy: Secondary | ICD-10-CM | POA: Diagnosis not present

## 2020-01-28 DIAGNOSIS — E119 Type 2 diabetes mellitus without complications: Secondary | ICD-10-CM | POA: Diagnosis not present

## 2020-01-28 DIAGNOSIS — E785 Hyperlipidemia, unspecified: Secondary | ICD-10-CM | POA: Diagnosis not present

## 2020-01-28 DIAGNOSIS — Z794 Long term (current) use of insulin: Secondary | ICD-10-CM | POA: Diagnosis not present

## 2020-01-28 DIAGNOSIS — I5022 Chronic systolic (congestive) heart failure: Secondary | ICD-10-CM

## 2020-01-28 DIAGNOSIS — E1169 Type 2 diabetes mellitus with other specified complication: Secondary | ICD-10-CM | POA: Diagnosis not present

## 2020-01-28 DIAGNOSIS — I10 Essential (primary) hypertension: Secondary | ICD-10-CM | POA: Diagnosis not present

## 2020-01-28 DIAGNOSIS — Z79899 Other long term (current) drug therapy: Secondary | ICD-10-CM | POA: Diagnosis not present

## 2020-01-28 DIAGNOSIS — I110 Hypertensive heart disease with heart failure: Secondary | ICD-10-CM | POA: Diagnosis not present

## 2020-01-28 DIAGNOSIS — E1165 Type 2 diabetes mellitus with hyperglycemia: Secondary | ICD-10-CM | POA: Diagnosis not present

## 2020-01-28 NOTE — Progress Notes (Signed)
Daily Session Note  Patient Details  Name: [name] MRN: [id number] Date of Birth: Jun 20, 1976 Referring Provider:     Cardiac Rehab from 11/04/2019 in [facility] Cardiac and Pulmonary Rehab  Referring Provider  [name]      Encounter Date: 01/28/2020  Check In: Session Check In - 01/28/20 1058      Check-In   Supervising physician immediately available to respond to emergencies  See telemetry face sheet for immediately available ER MD    Location  [facility]-Cardiac & Pulmonary Rehab    Staff Present  Renita Papa, RN BSN;Jeanna Durrell BS, Exercise Physiologist;Amanda Oletta Darter, BA, ACSM CEP, Exercise Physiologist    Virtual Visit  No    Medication changes reported      No    Fall or balance concerns reported     No    Warm-up and Cool-down  Performed on first and last piece of equipment    Resistance Training Performed  Yes    VAD Patient?  No    PAD/SET Patient?  No      Pain Assessment   Currently in Pain?  No/denies          Social History   Tobacco Use  Smoking Status Never Smoker  Smokeless Tobacco Never Used    Goals Met:  Independence with exercise equipment Exercise tolerated well No report of cardiac concerns or symptoms Strength training completed today  Goals Unmet:  Not Applicable  Comments: Pt able to follow exercise prescription today without complaint.  Will continue to monitor for progression.    Dr. Emily Filbert is Medical Director for [name] and [facility].

## 2020-02-09 ENCOUNTER — Telehealth: Payer: Self-pay

## 2020-02-09 NOTE — Telephone Encounter (Signed)
Called pt to check in as pt absent last week. Also, called to check in with goals. LM

## 2020-02-10 ENCOUNTER — Other Ambulatory Visit: Payer: Self-pay

## 2020-02-10 ENCOUNTER — Encounter: Payer: Self-pay | Admitting: *Deleted

## 2020-02-10 DIAGNOSIS — I5022 Chronic systolic (congestive) heart failure: Secondary | ICD-10-CM

## 2020-02-10 MED ORDER — ATORVASTATIN CALCIUM 40 MG PO TABS: 40.0000 mg | ORAL_TABLET | Freq: Every day | ORAL | 2 refills | Status: DC

## 2020-02-10 NOTE — Progress Notes (Signed)
Cardiac Individual Treatment Plan  Patient Details  Name: [name] MRN: [id number] Date of Birth: 08-Feb-1976 Referring Provider:     Cardiac Rehab from 11/04/2019 in [facility] Cardiac and Pulmonary Rehab  Referring Provider  [name]      Initial Encounter Date:    Cardiac Rehab from 11/04/2019 in [facility] Cardiac and Pulmonary Rehab  Date  11/04/19      Visit Diagnosis: Heart failure, chronic systolic (HCC)  Patient's Home Medications on Admission:  Current Outpatient Medications:  .  atorvastatin (LIPITOR) 40 MG tablet, Take 1 tablet (40 mg total) by mouth daily at 6 PM., Disp: 30 tablet, Rfl: 2 .  Blood Pressure Monitoring (BLOOD PRESSURE MONITOR/L CUFF) MISC, 1 Units by Does not apply route as needed., Disp: 1 each, Rfl: 0 .  carvedilol (COREG) 3.125 MG tablet, Take 1 tablet (3.125 mg total) by mouth 2 (two) times daily with a meal., Disp: 60 tablet, Rfl: 3 .  DULoxetine (CYMBALTA) 20 MG capsule, Take 2 capsules by mouth daily., Disp: , Rfl:  .  magnesium oxide (MAG-OX) 400 (241.3 Mg) MG tablet, Take 1 tablet (400 mg total) by mouth daily. (Patient not taking: Reported on 12/14/2019), Disp: 90 tablet, Rfl: 1 .  NOVOLIN 70/30 FLEXPEN (70-30) 100 UNIT/ML PEN, Inject into the skin 2 (two) times daily. Sliding scale, Disp: , Rfl:  .  potassium chloride SA (KLOR-CON) 20 MEQ tablet, Take 1 tablet (20 mEq total) by mouth 2 (two) times daily., Disp: 60 tablet, Rfl: 2 .  rOPINIRole (REQUIP) 0.25 MG tablet, Take 1 tablet by mouth 3 (three) times daily., Disp: , Rfl:  .  sacubitril-valsartan (ENTRESTO) 24-26 MG, Take 1 tablet by mouth 2 (two) times daily., Disp: 60 tablet, Rfl: 3 .  torsemide (DEMADEX) 20 MG tablet, Take 1 tablet (20 mg total) by mouth daily., Disp: , Rfl:   Past Medical History: Past Medical History:  Diagnosis Date  . CHF (congestive heart failure) ([place])   . CKD (chronic kidney disease), stage III   . Diabetes mellitus   . ED (erectile dysfunction)   .  Hypertension     Tobacco Use: Social History   Tobacco Use  Smoking Status Never Smoker  Smokeless Tobacco Never Used    Labs: Recent Review Flowsheet Data    Labs for ITP Cardiac and Pulmonary Rehab Latest Ref Rng & Units 04/25/2011 02/09/2012 05/26/2018 09/14/2019 09/15/2019   Cholestrol 0 - 200 mg/dL - - 177 - 118   LDLCALC 0 - 99 mg/dL - - 104(H) - 60   HDL >40 mg/dL - - 57.00 - 48   Trlycerides <150 mg/dL - - 81.0 - 49   Hemoglobin A1c 4.8 - 5.6 % - - 13.4(A) 7.4(H) 7.5(H)   TCO2 0 - 100 mmol/L 30 29 - - -       Exercise Target Goals: Exercise Program Goal: Individual exercise prescription set using results from initial 6 min walk test and THRR while considering  patient's activity barriers and safety.   Exercise Prescription Goal: Initial exercise prescription builds to 30-45 minutes a day of aerobic activity, 2-3 days per week.  Home exercise guidelines will be given to patient during program as part of exercise prescription that the participant will acknowledge.  Activity Barriers & Risk Stratification: Activity Barriers & Cardiac Risk Stratification - 11/02/19 1556      Activity Barriers & Cardiac Risk Stratification   Activity Barriers  Arthritis;Back Problems;Shortness of Breath;Assistive Device       6 Minute  Walk: 6 Minute Walk    Row Name 11/04/19 1605         6 Minute Walk   Phase  Initial     Distance  1242 feet     Walk Time  6 minutes     # of Rest Breaks  0     MPH  2.35     METS  4.4     RPE  12     VO2 Peak  15.41     Symptoms  Yes (comment)     Comments  6 minute walk test completed today. Patient did have some chest tightness that he reports as a regular occurence that his Cardiologist is aware of and attributes to his low EF. Participant felt better after rest and moving on to balance test and resistance training.     Resting HR  74 bpm     Resting BP  108/84     Exercise Oxygen Saturation  during 6 min walk  99 %     Max Ex. HR  122 bpm      Max Ex. BP  128/80     2 Minute Post BP  110/80        Oxygen Initial Assessment:   Oxygen Re-Evaluation:   Oxygen Discharge (Final Oxygen Re-Evaluation):   Initial Exercise Prescription: Initial Exercise Prescription - 11/04/19 1600      Date of Initial Exercise RX and Referring Provider   Date  11/04/19    Referring Provider  [name]      Treadmill   MPH  2.2    Grade  0    Minutes  15    METs  2.5      Recumbant Bike   Level  2    RPM  60    Watts  20    Minutes  15    METs  2.5      T5 Nustep   Level  2    SPM  60    Minutes  15    METs  2      Biostep-RELP   Level  2    SPM  50    Minutes  15    METs  2      Prescription Details   Frequency (times per week)  3    Duration  Progress to 30 minutes of continuous aerobic without signs/symptoms of physical distress      Intensity   THRR 40-80% of Max Heartrate  114-155    Ratings of Perceived Exertion  11-15    Perceived Dyspnea  0-4      Progression   Progression  Continue progressive overload as per policy without signs/symptoms or physical distress.      Resistance Training   Training Prescription  Yes    Weight  3    Reps  10-15       Perform Capillary Blood Glucose checks as needed.  Exercise Prescription Changes: Exercise Prescription Changes    Row Name 11/10/19 1400 11/27/19 1000 12/09/19 1400 12/23/19 1200 01/05/20 1300     Response to Exercise   Blood Pressure (Admit)  146/64  100/64  138/70  128/84  134/70   Blood Pressure (Exercise)  --  112/60  138/82  152/76  136/64   Blood Pressure (Exit)  120/70  94/60  90/60  130/70  116/60   Heart Rate (Admit)  85 bpm  87 bpm  114 bpm  78 bpm  97 bpm   Heart Rate (Exercise)  112 bpm  113 bpm  140 bpm  124 bpm  121 bpm   Heart Rate (Exit)  93 bpm  89 bpm  97 bpm  110 bpm  105 bpm   Rating of Perceived Exertion (Exercise)  '14  17  17  13  13   '$ Symptoms  none  fatigue on elliptical  --  hip pain on elliptical  --   Comments  1st  day  --  --  --  --   Duration  Progress to 30 minutes of  aerobic without signs/symptoms of physical distress  Continue with 30 min of aerobic exercise without signs/symptoms of physical distress.  Continue with 30 min of aerobic exercise without signs/symptoms of physical distress.  Continue with 30 min of aerobic exercise without signs/symptoms of physical distress.  Continue with 30 min of aerobic exercise without signs/symptoms of physical distress.   Intensity  THRR unchanged  THRR unchanged  THRR unchanged  THRR unchanged  THRR unchanged     Progression   Progression  Continue to progress workloads to maintain intensity without signs/symptoms of physical distress.  Continue to progress workloads to maintain intensity without signs/symptoms of physical distress.  Continue to progress workloads to maintain intensity without signs/symptoms of physical distress.  Continue to progress workloads to maintain intensity without signs/symptoms of physical distress.  Continue to progress workloads to maintain intensity without signs/symptoms of physical distress.   Average METs  2.7  2.91  2.7  2.94  3.25     Resistance Training   Training Prescription  Yes  Yes  Yes  Yes  Yes   Weight  3 lb  3 lb  3 lb  3 lb  8 lb   Reps  10-15  10-15  10-15  10-15  10-15     Interval Training   Interval Training  --  No  No  No  No     Treadmill   MPH  --  3  2.2  2.2  2.3   Grade  --  1  0  0  0   Minutes  --  '15  15  15  15   '$ METs  --  3.71  2.69  2.68  2.69     Recumbant Bike   Level  1  --  --  7  --   RPM  60  --  --  --  --   Watts  25  --  --  40  --   Minutes  15  --  --  15  --   METs  2.8  --  --  3.26  --     NuStep   Level  2  --  --  5  --   SPM  80  --  --  --  --   Minutes  15  --  --  15  --   METs  2.1  --  --  2.9  --     Elliptical   Level  --  1  1  --  --   Speed  --  2.5  2.5  --  --   Minutes  --  10 2-5 min bouts  10 short bouts as tolerated  --  --     REL-XR   Level   --  --  --  --  4   Minutes  --  --  --  --  15   METs  --  --  --  --  3.8     T5 Nustep   Level  --  2  --  --  --   Minutes  --  15  --  --  --   METs  --  2.1  --  --  --     Home Exercise Plan   Plans to continue exercise at  --  --  --  Home (comment) walking  --   Frequency  --  --  --  Add 3 additional days to program exercise sessions.  --   Initial Home Exercises Provided  --  --  --  12/10/19  --   Sharon Name 01/20/20 1300 02/03/20 1500           Response to Exercise   Blood Pressure (Admit)  122/72  128/82      Blood Pressure (Exercise)  144/80  142/82      Blood Pressure (Exit)  108/64  102/78      Heart Rate (Admit)  86 bpm  95 bpm      Heart Rate (Exercise)  135 bpm  127 bpm      Heart Rate (Exit)  103 bpm  89 bpm      Rating of Perceived Exertion (Exercise)  15  17      Duration  Continue with 30 min of aerobic exercise without signs/symptoms of physical distress.  Continue with 30 min of aerobic exercise without signs/symptoms of physical distress.      Intensity  THRR unchanged  THRR unchanged        Progression   Progression  Continue to progress workloads to maintain intensity without signs/symptoms of physical distress.  Continue to progress workloads to maintain intensity without signs/symptoms of physical distress.      Average METs  3.13  2.6        Resistance Training   Training Prescription  Yes  Yes      Weight  8 lb  8 lb      Reps  10-15  10-15        Interval Training   Interval Training  No  No        Treadmill   MPH  2.3  1.9      Grade  0  0      Minutes  15  15      METs  2.76  --        REL-XR   Level  4  --      Minutes  15  --      METs  3.5  --        T5 Nustep   Level  --  5      SPM  --  80      Minutes  --  15      METs  --  2.5        Home Exercise Plan   Plans to continue exercise at  Home (comment) walking  Home (comment) walking      Frequency  Add 3 additional days to program exercise sessions.  Add 3 additional  days to program exercise sessions.      Initial Home Exercises Provided  12/10/19  12/10/19         Exercise Comments: Exercise Comments    Row Name 11/09/19 4854 11/26/19 1049 01/19/20 1025 01/19/20 1118     Exercise Comments  First full day of exercise!  Patient was oriented to gym and equipment including functions, settings, policies, and procedures.  Patient's individual exercise prescription and treatment plan were reviewed.  All starting workloads were established based on the results of the 6 minute walk test done at initial orientation visit.  The plan for exercise progression was also introduced and progression will be customized based on patient's performance and goals.  Rons sciatica has been bothering him.  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  Staff showed Ron stretches (modified figure 4 )  and recommended PT consult in the meantime.  Nathon is experiencing left shoulder discomfort.  Thinks he pulled a muscle. Movement of shoulder creates discomfort.  Advised to call MD if this continues or gets worse. Also advised not to use left arm to hard today. He verbalized understanding.  Morris stated he did fine today, no further discomfort while exercising.       Exercise Goals and Review: Exercise Goals    Row Name 11/04/19 1627             Exercise Goals   Increase Physical Activity  Yes       Intervention  Provide advice, education, support and counseling about physical activity/exercise needs.;Develop an individualized exercise prescription for aerobic and resistive training based on initial evaluation findings, risk stratification, comorbidities and participant's personal goals.       Expected Outcomes  Short Term: Attend rehab on a regular basis to increase amount of physical activity.;Long Term: Add in home exercise to make exercise part of routine and to increase amount of physical activity.;Long Term: Exercising regularly at  least 3-5 days a week.       Increase Strength and Stamina  Yes       Intervention  Provide advice, education, support and counseling about physical activity/exercise needs.;Develop an individualized exercise prescription for aerobic and resistive training based on initial evaluation findings, risk stratification, comorbidities and participant's personal goals.       Expected Outcomes  Short Term: Increase workloads from initial exercise prescription for resistance, speed, and METs.;Short Term: Perform resistance training exercises routinely during rehab and add in resistance training at home;Long Term: Improve cardiorespiratory fitness, muscular endurance and strength as measured by increased METs and functional capacity (6MWT)       Able to understand and use rate of perceived exertion (RPE) scale  Yes       Intervention  Provide education and explanation on how to use RPE scale       Expected Outcomes  Short Term: Able to use RPE daily in rehab to express subjective intensity level;Long Term:  Able to use RPE to guide intensity level when exercising independently       Able to understand and use Dyspnea scale  Yes       Intervention  Provide education and explanation on how to use Dyspnea scale       Expected Outcomes  Short Term: Able to use Dyspnea scale daily in rehab to express subjective sense of shortness of breath during exertion;Long Term: Able to use Dyspnea scale to guide intensity level when exercising independently       Knowledge and understanding of Target Heart Rate Range (THRR)  Yes       Intervention  Provide education and explanation of THRR including how the numbers were predicted and where they are located for reference  Expected Outcomes  Short Term: Able to state/look up THRR;Long Term: Able to use THRR to govern intensity when exercising independently;Short Term: Able to use daily as guideline for intensity in rehab       Able to check pulse independently  Yes        Intervention  Provide education and demonstration on how to check pulse in carotid and radial arteries.;Review the importance of being able to check your own pulse for safety during independent exercise       Expected Outcomes  Short Term: Able to explain why pulse checking is important during independent exercise;Long Term: Able to check pulse independently and accurately       Understanding of Exercise Prescription  Yes       Intervention  Provide education, explanation, and written materials on patient's individual exercise prescription       Expected Outcomes  Short Term: Able to explain program exercise prescription;Long Term: Able to explain home exercise prescription to exercise independently          Exercise Goals Re-Evaluation : Exercise Goals Re-Evaluation    Row Name 11/09/19 6144 11/27/19 1035 12/09/19 1457 12/10/19 1041 12/23/19 1211     Exercise Goal Re-Evaluation   Exercise Goals Review  Able to understand and use rate of perceived exertion (RPE) scale;Able to understand and use Dyspnea scale;Able to check pulse independently;Understanding of Exercise Prescription  Increase Physical Activity;Increase Strength and Stamina;Understanding of Exercise Prescription  Increase Physical Activity;Increase Strength and Stamina;Able to understand and use rate of perceived exertion (RPE) scale;Able to understand and use Dyspnea scale;Able to check pulse independently;Knowledge and understanding of Target Heart Rate Range (THRR);Understanding of Exercise Prescription  Increase Physical Activity;Increase Strength and Stamina;Understanding of Exercise Prescription  Increase Physical Activity;Increase Strength and Stamina;Understanding of Exercise Prescription   Comments  Reviewed RPE scale, THR and program prescription with pt today.  Pt voiced understanding and was given a copy of goals to take home.  Gordy is off to a good start in rehab. He tried the treadmill and elliptical yesterday.  He did well  on the treadmill, but the elliptical was challenging.  He also complained of some sciattica and hip pain. 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  We talked with him about stretching and strengthening his hip to help avoid surgery.  We also recommended a new PT consult to help as well.  He was open to all options.  We will continue to monitor his hip and progress.  Ron finds the elliptical very challenging.  He is doing short bouts on the elliptical  Ron is doing well in rehab.  He has not been doing much at home.  He usually stretches and walks around block for about 10 min. Reviewed home exercise with pt today.  Pt plans to walk and stretch at home for exercise.  Reviewed THR, pulse, RPE, sign and symptoms, NTG use, and when to call 911 or MD.  Also discussed weather considerations and indoor options.  Pt voiced understanding.  Ron has been doing well in rehab.  He was able to do well on the bike and we have moved him off the elliptical as it really aggravated his hip.  We will continue to montior his progress.   Expected Outcomes  Short: Use RPE daily to regulate intensity. Long: Follow program prescription in THR.  Short: Try stretching and strengthen to support hip.  Long: Continue to follow program prescription and attend  regularly  Short - build endurance on elliptical Long:  improve overall stamina  Short: Increase walking time at home. Long: Continue to improve stamina.  Short: Increase workload on treadmill.  Long: Continue to add in exercise at home.   Brady Name 12/29/19 1031 01/05/20 1308 01/20/20 1344 02/03/20 1552       Exercise Goal Re-Evaluation   Exercise Goals Review  Increase Physical Activity;Increase Strength and Stamina;Understanding of Exercise Prescription  Increase Physical Activity;Increase Strength and Stamina;Able to understand and use rate of perceived exertion (RPE) scale;Able to understand and use Dyspnea scale;Knowledge and understanding of  Target Heart Rate Range (THRR);Able to check pulse independently;Understanding of Exercise Prescription  Increase Physical Activity;Increase Strength and Stamina;Understanding of Exercise Prescription  Increase Physical Activity;Increase Strength and Stamina;Able to understand and use rate of perceived exertion (RPE) scale;Able to understand and use Dyspnea scale;Knowledge and understanding of Target Heart Rate Range (THRR);Able to check pulse independently;Understanding of Exercise Prescription    Comments  Weighted sit-ups 5 on each arm - will do 20 minutes 3x/week. Does laps around the store and other unstructured exercise. Pt reports walking on road hurts his feet and he is fatigued and his body is hurting (leg and back).  Ron has increased to 8 ob weights fro strength work and lincreased TM speed.  He is exercsising some at home.  Staff will monitor progress.  Ron continues to do well in rehab.  Yesterday we limited his arms as he had been having some shoulder pain that seemed muscular in nature.  He is up to level 4 on the XR.  We will continue to monitor his progress.  Ron has only attended twice in March.  Consistent exercise will yield more progress.    Expected Outcomes  Short: Increase workload on treadmill.  Long: Continue to add in exercise at home.  --  Short: Add incline to treadmill if hip able to tolerate.  Long: Continue to improve stamina.  Short : attend/exercise consistently Long: improve overall MET level       Discharge Exercise Prescription (Final Exercise Prescription Changes): Exercise Prescription Changes - 02/03/20 1500      Response to Exercise   Blood Pressure (Admit)  128/82    Blood Pressure (Exercise)  142/82    Blood Pressure (Exit)  102/78    Heart Rate (Admit)  95 bpm    Heart Rate (Exercise)  127 bpm    Heart Rate (Exit)  89 bpm    Rating of Perceived Exertion (Exercise)  17    Duration  Continue with 30 min of aerobic exercise without signs/symptoms of physical  distress.    Intensity  THRR unchanged      Progression   Progression  Continue to progress workloads to maintain intensity without signs/symptoms of physical distress.    Average METs  2.6      Resistance Training   Training Prescription  Yes    Weight  8 lb    Reps  10-15      Interval Training   Interval Training  No      Treadmill   MPH  1.9    Grade  0    Minutes  15      T5 Nustep   Level  5    SPM  80    Minutes  15    METs  2.5      Home Exercise Plan   Plans to continue exercise at  Home (comment)   walking  Frequency  Add 3 additional days to program exercise sessions.    Initial Home Exercises Provided  12/10/19       Nutrition:  Target Goals: Understanding of nutrition guidelines, daily intake of sodium '1500mg'$ , cholesterol '200mg'$ , calories 30% from fat and 7% or less from saturated fats, daily to have 5 or more servings of fruits and vegetables.  Biometrics: Pre Biometrics - 11/04/19 1631      Pre Biometrics   Height  6' 1.5" (1.867 m)    Weight  221 lb 14.4 oz (100.7 kg)    BMI (Calculated)  28.88    Single Leg Stand  30 seconds        Nutrition Therapy Plan and Nutrition Goals:   Nutrition Assessments:   Nutrition Goals Re-Evaluation:   Nutrition Goals Discharge (Final Nutrition Goals Re-Evaluation):   Psychosocial: Target Goals: Acknowledge presence or absence of significant depression and/or stress, maximize coping skills, provide positive support system. Participant is able to verbalize types and ability to use techniques and skills needed for reducing stress and depression.   Initial Review & Psychosocial Screening:   Quality of Life Scores:  Quality of Life - 11/04/19 1621      Quality of Life   Select  Quality of Life      Quality of Life Scores   Health/Function Pre  11.6 %    Socioeconomic Pre  25.5 %    Psych/Spiritual Pre  26.57 %    Family Pre  27.6 %    GLOBAL Pre  20.06 %      Scores of 19 and below usually  indicate a poorer quality of life in these areas.  A difference of  2-3 points is a clinically meaningful difference.  A difference of 2-3 points in the total score of the Quality of Life Index has been associated with significant improvement in overall quality of life, self-image, physical symptoms, and general health in studies assessing change in quality of life.  PHQ-9: Recent Review Flowsheet Data    Depression screen [facility] 2/9 11/24/2019 05/26/2018 04/26/2014   Decreased Interest 0 0 0   Down, Depressed, Hopeless 0 0 0   PHQ - 2 Score 0 0 0     Interpretation of Total Score  Total Score Depression Severity:  1-4 = Minimal depression, 5-9 = Mild depression, 10-14 = Moderate depression, 15-19 = Moderately severe depression, 20-27 = Severe depression   Psychosocial Evaluation and Intervention:   Psychosocial Re-Evaluation: Psychosocial Re-Evaluation    Row Name 12/10/19 1044 12/29/19 1035           Psychosocial Re-Evaluation   Current issues with  Current Stress Concerns  Current Stress Concerns      Comments  Ron is doing well in rehab.  His hips are his biggest limitations for exercise and mobility.  His health is his biggest stressor.  He gets at least 6 hours of sleep a night.  His hips are his biggest limitations for exercise and mobility.  His health is his biggest stressor, but his health is not a "mental stressor". Pt reports wife is a good support system and that she will cook for him.  He gets at least 6 hours of sleep a night, body pain sometimes makes it hard to sleep; will get on average 8-9 hours of sleep with 2-3 interuptions from pain or bathroom.      Expected Outcomes  Short: Continue to attend to build hip strength.  Long: COntinue to cope positively  Short: Continue to attend to build hip strength.  Long: Continue to cope positively      Interventions  Encouraged to attend Cardiac Rehabilitation for the exercise  Encouraged to attend Cardiac Rehabilitation for the exercise       Continue Psychosocial Services   Follow up required by staff  Follow up required by staff         Psychosocial Discharge (Final Psychosocial Re-Evaluation): Psychosocial Re-Evaluation - 12/29/19 1035      Psychosocial Re-Evaluation   Current issues with  Current Stress Concerns    Comments  His hips are his biggest limitations for exercise and mobility.  His health is his biggest stressor, but his health is not a "mental stressor". Pt reports wife is a good support system and that she will cook for him.  He gets at least 6 hours of sleep a night, body pain sometimes makes it hard to sleep; will get on average 8-9 hours of sleep with 2-3 interuptions from pain or bathroom.    Expected Outcomes  Short: Continue to attend to build hip strength.  Long: Continue to cope positively    Interventions  Encouraged to attend Cardiac Rehabilitation for the exercise    Continue Psychosocial Services   Follow up required by staff       Vocational Rehabilitation: Provide vocational rehab assistance to qualifying candidates.   Vocational Rehab Evaluation & Intervention:   Education: Education Goals: Education classes will be provided on a variety of topics geared toward better understanding of heart health and risk factor modification. Participant will state understanding/return demonstration of topics presented as noted by education test scores.  Learning Barriers/Preferences: Learning Barriers/Preferences - 11/02/19 1607      Learning Barriers/Preferences   Learning Barriers  None    Learning Preferences  Individual Instruction       Education Topics:  AED/CPR: - Group verbal and written instruction with the use of models to demonstrate the basic use of the AED with the basic ABC's of resuscitation.   General Nutrition Guidelines/Fats and Fiber: -Group instruction provided by verbal, written material, models and posters to present the general guidelines for heart healthy nutrition.  Gives an explanation and review of dietary fats and fiber.   Controlling Sodium/Reading Food Labels: -Group verbal and written material supporting the discussion of sodium use in heart healthy nutrition. Review and explanation with models, verbal and written materials for utilization of the food label.   Exercise Physiology & General Exercise Guidelines: - Group verbal and written instruction with models to review the exercise physiology of the cardiovascular system and associated critical values. Provides general exercise guidelines with specific guidelines to those with heart or lung disease.    Aerobic Exercise & Resistance Training: - Gives group verbal and written instruction on the various components of exercise. Focuses on aerobic and resistive training programs and the benefits of this training and how to safely progress through these programs..   Flexibility, Balance, Mind/Body Relaxation: Provides group verbal/written instruction on the benefits of flexibility and balance training, including mind/body exercise modes such as yoga, pilates and tai chi.  Demonstration and skill practice provided.   Stress and Anxiety: - Provides group verbal and written instruction about the health risks of elevated stress and causes of high stress.  Discuss the correlation between heart/lung disease and anxiety and treatment options. Review healthy ways to manage with stress and anxiety.   Depression: - Provides group verbal and written instruction on the correlation between heart/lung disease and  depressed mood, treatment options, and the stigmas associated with seeking treatment.   Anatomy & Physiology of the Heart: - Group verbal and written instruction and models provide basic cardiac anatomy and physiology, with the coronary electrical and arterial systems. Review of Valvular disease and Heart Failure   Cardiac Procedures: - Group verbal and written instruction to review commonly  prescribed medications for heart disease. Reviews the medication, class of the drug, and side effects. Includes the steps to properly store meds and maintain the prescription regimen. (beta blockers and nitrates)   Cardiac Medications I: - Group verbal and written instruction to review commonly prescribed medications for heart disease. Reviews the medication, class of the drug, and side effects. Includes the steps to properly store meds and maintain the prescription regimen.   Cardiac Medications II: -Group verbal and written instruction to review commonly prescribed medications for heart disease. Reviews the medication, class of the drug, and side effects. (all other drug classes)    Go Sex-Intimacy & Heart Disease, Get SMART - Goal Setting: - Group verbal and written instruction through game format to discuss heart disease and the return to sexual intimacy. Provides group verbal and written material to discuss and apply goal setting through the application of the S.M.A.R.T. Method.   Other Matters of the Heart: - Provides group verbal, written materials and models to describe Stable Angina and Peripheral Artery. Includes description of the disease process and treatment options available to the cardiac patient.   Exercise & Equipment Safety: - Individual verbal instruction and demonstration of equipment use and safety with use of the equipment.   Infection Prevention: - Provides verbal and written material to individual with discussion of infection control including proper hand washing and proper equipment cleaning during exercise session.   Falls Prevention: - Provides verbal and written material to individual with discussion of falls prevention and safety.   Diabetes: - Individual verbal and written instruction to review signs/symptoms of diabetes, desired ranges of glucose level fasting, after meals and with exercise. Acknowledge that pre and post exercise glucose checks will be  done for 3 sessions at entry of program.   Know Your Numbers and Risk Factors: -Group verbal and written instruction about important numbers in your health.  Discussion of what are risk factors and how they play a role in the disease process.  Review of Cholesterol, Blood Pressure, Diabetes, and BMI and the role they play in your overall health.   Sleep Hygiene: -Provides group verbal and written instruction about how sleep can affect your health.  Define sleep hygiene, discuss sleep cycles and impact of sleep habits. Review good sleep hygiene tips.    Other: -Provides group and verbal instruction on various topics (see comments)   Knowledge Questionnaire Score: Knowledge Questionnaire Score - 11/04/19 1623      Knowledge Questionnaire Score   Pre Score  24/26       Core Components/Risk Factors/Patient Goals at Admission: Personal Goals and Risk Factors at Admission - 11/04/19 1623      Core Components/Risk Factors/Patient Goals on Admission   Diabetes  Yes    Intervention  Provide education about signs/symptoms and action to take for hypo/hyperglycemia.;Provide education about proper nutrition, including hydration, and aerobic/resistive exercise prescription along with prescribed medications to achieve blood glucose in normal ranges: Fasting glucose 65-99 mg/dL    Expected Outcomes  Short Term: Participant verbalizes understanding of the signs/symptoms and immediate care of hyper/hypoglycemia, proper foot care and importance of medication, aerobic/resistive exercise and nutrition  plan for blood glucose control.;Long Term: Attainment of HbA1C < 7%.    Heart Failure  Yes    Intervention  Provide a combined exercise and nutrition program that is supplemented with education, support and counseling about heart failure. Directed toward relieving symptoms such as shortness of breath, decreased exercise tolerance, and extremity edema.    Expected Outcomes  Improve functional capacity of  life;Short term: Attendance in program 2-3 days a week with increased exercise capacity. Reported lower sodium intake. Reported increased fruit and vegetable intake. Reports medication compliance.;Short term: Daily weights obtained and reported for increase. Utilizing diuretic protocols set by physician.;Long term: Adoption of self-care skills and reduction of barriers for early signs and symptoms recognition and intervention leading to self-care maintenance.    Hypertension  Yes    Intervention  Provide education on lifestyle modifcations including regular physical activity/exercise, weight management, moderate sodium restriction and increased consumption of fresh fruit, vegetables, and low fat dairy, alcohol moderation, and smoking cessation.;Monitor prescription use compliance.    Expected Outcomes  Short Term: Continued assessment and intervention until BP is < 140/72m HG in hypertensive participants. < 130/844mHG in hypertensive participants with diabetes, heart failure or chronic kidney disease.;Long Term: Maintenance of blood pressure at goal levels.    Lipids  Yes    Intervention  Provide education and support for participant on nutrition & aerobic/resistive exercise along with prescribed medications to achieve LDL '70mg'$ , HDL >'40mg'$ .    Expected Outcomes  Short Term: Participant states understanding of desired cholesterol values and is compliant with medications prescribed. Participant is following exercise prescription and nutrition guidelines.;Long Term: Cholesterol controlled with medications as prescribed, with individualized exercise RX and with personalized nutrition plan. Value goals: LDL < '70mg'$ , HDL > 40 mg.    Stress  Yes    Intervention  Offer individual and/or small group education and counseling on adjustment to heart disease, stress management and health-related lifestyle change. Teach and support self-help strategies.    Personal Goal Other  Yes    Personal Goal  STG- Participant  wants to get back to feeling like he's in his [age]'snd breathing freely. LTG- Participant wants to be able to function better all around.    Intervention  Begin Cardiac Rehab program, learn about and implement heart healthy living,develop habit of exercising and taking care of self.    Expected Outcomes  Accomplishing the set at intake and setting new goals. Finding new normal and how to live comfortably with new diagnosis.       Core Components/Risk Factors/Patient Goals Review:  Goals and Risk Factor Review    Row Name 12/10/19 1046 12/29/19 1039           Core Components/Risk Factors/Patient Goals Review   Personal Goals Review  Weight Management/Obesity;Hypertension;Heart Failure;Diabetes  Weight Management/Obesity;Hypertension;Heart Failure;Diabetes      Review  Ron is doing well.  His weight has been steady.  However he is starting to notice changes in his body composition.  His clothes are starting to fit differently.  Blood sugars have been up and down but are starting to regulate more.  He is also starting to watch his soduim levels.  He is just having some chest pressure but no other heart failure symptoms.  Pt reports that his weight is stable but his clothes are now much looser and may need new clothes soon. He reports his BG is eratic; 300 yesterday and last night is was 99, bottomed out overnight and drank some juice, this  morning it was 94. He reports eating consistently. On insulin, he says his MD is going to change meds to help with this. He is still feeling chest pressure and light headed if he gets up too quickly, but no other HF symptoms.      Expected Outcomes  Short: Continue to work on weight.  Long: Continue to monitor risk factors.  Short: Continue to work on strength Long: Continue to monitor risk factors.         Core Components/Risk Factors/Patient Goals at Discharge (Final Review):  Goals and Risk Factor Review - 12/29/19 1039      Core Components/Risk  Factors/Patient Goals Review   Personal Goals Review  Weight Management/Obesity;Hypertension;Heart Failure;Diabetes    Review  Pt reports that his weight is stable but his clothes are now much looser and may need new clothes soon. He reports his BG is eratic; 300 yesterday and last night is was 99, bottomed out overnight and drank some juice, this morning it was 94. He reports eating consistently. On insulin, he says his MD is going to change meds to help with this. He is still feeling chest pressure and light headed if he gets up too quickly, but no other HF symptoms.    Expected Outcomes  Short: Continue to work on strength Long: Continue to monitor risk factors.       ITP Comments: ITP Comments    Row Name 11/02/19 1553 11/04/19 1603 11/09/19 0925 11/18/19 0851 12/16/19 1228   ITP Comments  Virtual Eval/Intake Call/ EP Orientation Wed 11/04/19 at 1:30pm Diagnosis Documentation can be found in [facility] 10/26.  6 minute walk test completed today. Patient did have some chest tightness that he reports as a regular occurence that his Cardiologist is aware of and attributes to his low EF. Participant felt better after rest and moving on to balance test and resistance training.  First full day of exercise!  Patient was oriented to gym and equipment including functions, settings, policies, and procedures.  Patient's individual exercise prescription and treatment plan were reviewed.  All starting workloads were established based on the results of the 6 minute walk test done at initial orientation visit.  The plan for exercise progression was also introduced and progression will be customized based on patient's performance and goals.  30 day review competed . ITP sent to Dr Emily Filbert for review, changes as needed and ITP approval signature  New to program  30 day review completed. ITP sent to Dr. Emily Filbert, Medical Director of Cardiac and Pulmonary Rehab. Continue with ITP unless changes are made by physician.   Department operating under reduced schedule until further notice by request from hospital leadership.   [place] Name 01/13/20 0629 01/19/20 1022 01/19/20 1117 02/10/20 0633     ITP Comments  30 day chart review completed. ITP sent to Dr Zachery Dakins Medical Director, for review,changes as needed and signature.  Kimm is experiencing left shoulder discomfort.  Thinks he pulled a muscle. Movement of shoulder creates discomfort.  Advised to call MD if this continues or gets worse. Also advised not to use left arm to hard today. He verbalized understanding.  Ceaser stated he did fine today, no further discomfort while exercising.  30 day chart review completed. ITP sent to Dr Zachery Dakins Medical Director, for review,changes as needed and signature. Continue with ITP if no changes requested       Comments:

## 2020-02-11 ENCOUNTER — Other Ambulatory Visit: Payer: Self-pay

## 2020-02-11 MED ORDER — ATORVASTATIN CALCIUM 40 MG PO TABS: 40.0000 mg | ORAL_TABLET | Freq: Every day | ORAL | 0 refills | Status: DC

## 2020-02-16 ENCOUNTER — Encounter: Payer: Medicare Other | Admitting: *Deleted

## 2020-02-16 ENCOUNTER — Other Ambulatory Visit: Payer: Self-pay

## 2020-02-16 DIAGNOSIS — Z79899 Other long term (current) drug therapy: Secondary | ICD-10-CM | POA: Diagnosis not present

## 2020-02-16 DIAGNOSIS — I5022 Chronic systolic (congestive) heart failure: Secondary | ICD-10-CM | POA: Diagnosis not present

## 2020-02-16 DIAGNOSIS — E119 Type 2 diabetes mellitus without complications: Secondary | ICD-10-CM | POA: Diagnosis not present

## 2020-02-16 DIAGNOSIS — I110 Hypertensive heart disease with heart failure: Secondary | ICD-10-CM | POA: Diagnosis not present

## 2020-02-16 DIAGNOSIS — I5020 Unspecified systolic (congestive) heart failure: Secondary | ICD-10-CM

## 2020-02-16 MED ORDER — POTASSIUM CHLORIDE CRYS ER 20 MEQ PO TBCR: 20.0000 meq | EXTENDED_RELEASE_TABLET | Freq: Two times a day (BID) | ORAL | 0 refills | Status: DC

## 2020-02-16 MED ORDER — CARVEDILOL 3.125 MG PO TABS: 3.1250 mg | ORAL_TABLET | Freq: Two times a day (BID) | ORAL | 0 refills | Status: DC

## 2020-02-16 NOTE — Progress Notes (Signed)
Daily Session Note  Patient Details  Name: [name] MRN: [id number] Date of Birth: 08-Oct-1976 Referring Provider:     Cardiac Rehab from 11/04/2019 in [facility] Cardiac and Pulmonary Rehab  Referring Provider  [name]      Encounter Date: 02/16/2020  Check In: Session Check In - 02/16/20 1057      Check-In   Supervising physician immediately available to respond to emergencies  See telemetry face sheet for immediately available ER MD    Staff Present  Heath Lark, RN, BSN, CCRP;Joseph Hood RCP,RRT,BSRT;Jessica Axtell, [place], [place], [place], CCET    Virtual Visit  No    Medication changes reported      No    Fall or balance concerns reported     No    Warm-up and Cool-down  Performed on first and last piece of equipment    Resistance Training Performed  Yes    VAD Patient?  No    PAD/SET Patient?  No      Pain Assessment   Currently in Pain?  No/denies          Social History   Tobacco Use  Smoking Status Never Smoker  Smokeless Tobacco Never Used    Goals Met:  Proper associated with RPD/PD & O2 Sat Exercise tolerated well No report of cardiac concerns or symptoms  Goals Unmet:  Not Applicable  Comments: Pt able to follow exercise prescription today without complaint.  Will continue to monitor for progression.    Dr. Emily Filbert is Medical Director for [facility].

## 2020-02-18 ENCOUNTER — Encounter: Payer: Medicare Other | Attending: Internal Medicine | Admitting: *Deleted

## 2020-02-18 DIAGNOSIS — Z79899 Other long term (current) drug therapy: Secondary | ICD-10-CM | POA: Insufficient documentation

## 2020-02-18 DIAGNOSIS — I110 Hypertensive heart disease with heart failure: Secondary | ICD-10-CM | POA: Insufficient documentation

## 2020-02-18 DIAGNOSIS — I5022 Chronic systolic (congestive) heart failure: Secondary | ICD-10-CM | POA: Insufficient documentation

## 2020-02-18 DIAGNOSIS — E119 Type 2 diabetes mellitus without complications: Secondary | ICD-10-CM | POA: Insufficient documentation

## 2020-02-23 ENCOUNTER — Other Ambulatory Visit: Payer: Self-pay

## 2020-02-23 ENCOUNTER — Encounter: Payer: Medicare Other | Admitting: *Deleted

## 2020-02-23 DIAGNOSIS — Z79899 Other long term (current) drug therapy: Secondary | ICD-10-CM | POA: Diagnosis not present

## 2020-02-23 DIAGNOSIS — E119 Type 2 diabetes mellitus without complications: Secondary | ICD-10-CM | POA: Diagnosis not present

## 2020-02-23 DIAGNOSIS — I110 Hypertensive heart disease with heart failure: Secondary | ICD-10-CM | POA: Diagnosis not present

## 2020-02-23 DIAGNOSIS — I5022 Chronic systolic (congestive) heart failure: Secondary | ICD-10-CM

## 2020-02-23 NOTE — Progress Notes (Signed)
Daily Session Note  Patient Details  Name: [name] MRN: [id number] Date of Birth: 04/08/1976 Referring Provider:     Cardiac Rehab from 11/04/2019 in [facility] Cardiac and Pulmonary Rehab  Referring Provider  Christopher End      Encounter Date: 02/23/2020  Check In: Session Check In - 02/23/20 1028      Check-In   Supervising physician immediately available to respond to emergencies  See telemetry face sheet for immediately available ER MD    Location  [facility]-Cardiac & Pulmonary Rehab    Staff Present  Susanne Bice, RN, BSN, CCRP;Amanda Sommer, BA, ACSM CEP, Exercise Physiologist;Joseph Hood RCP,RRT,BSRT    Virtual Visit  No    Medication changes reported      No    Fall or balance concerns reported     No    Warm-up and Cool-down  Performed on first and last piece of equipment    Resistance Training Performed  Yes    VAD Patient?  No    PAD/SET Patient?  No      Pain Assessment   Currently in Pain?  No/denies          Social History   Tobacco Use  Smoking Status Never Smoker  Smokeless Tobacco Never Used    Goals Met:  Independence with exercise equipment Exercise tolerated well No report of cardiac concerns or symptoms  Goals Unmet:  Not Applicable  Comments: Pt able to follow exercise prescription today without complaint.  Will continue to monitor for progression.    Dr. Mark Miller is Medical Director for [facility] and [facility]. 

## 2020-02-28 ENCOUNTER — Other Ambulatory Visit: Payer: Self-pay | Admitting: Physician Assistant

## 2020-02-28 MED ORDER — TORSEMIDE 20 MG PO TABS: 20.0000 mg | ORAL_TABLET | Freq: Every day | ORAL | 3 refills | Status: DC

## 2020-02-29 ENCOUNTER — Encounter: Payer: Self-pay | Admitting: Cardiology

## 2020-02-29 ENCOUNTER — Ambulatory Visit (INDEPENDENT_AMBULATORY_CARE_PROVIDER_SITE_OTHER): Payer: Medicare Other | Admitting: Cardiology

## 2020-02-29 ENCOUNTER — Other Ambulatory Visit: Payer: Self-pay

## 2020-02-29 VITALS — BP 110/76 | HR 79 | Ht 73.0 in | Wt 231.0 lb

## 2020-02-29 DIAGNOSIS — I428 Other cardiomyopathies: Secondary | ICD-10-CM

## 2020-02-29 DIAGNOSIS — I10 Essential (primary) hypertension: Secondary | ICD-10-CM

## 2020-02-29 MED ORDER — CARVEDILOL 6.25 MG PO TABS: 6.2500 mg | ORAL_TABLET | Freq: Two times a day (BID) | ORAL | 2 refills | Status: DC

## 2020-02-29 NOTE — Progress Notes (Signed)
Cardiology Office Note:    Date:  02/29/2020   ID:  [name], DOB August 04, 1976, MRN [id number]  PCP:  Emi Belfast, FNP  Cardiologist:  Debbe Odea, MD  Electrophysiologist:  None   Referring MD: Emi Belfast, FNP   Chief Complaint  Patient presents with  . OTher    3 month follow up. Patient c.o some chest tightness and SOB. Meds reviewed verbally with patient.     History of Present Illness:    [name] is a 44 y.o. male with a hx of hypertension, diabetes, hyperlipidemia, NICM EF <20 who presents for follow-up.  Patient with history of nonischemic cardiomyopathy.  Medications are being uptitrated as tolerated.  Echocardiogram was repeated to evaluate ejection fraction with medication titration to see if patient has had any improvement in ejection fraction.  He feels fine, denies edema, rest pain or shortness of breath.   Past Medical History:  Diagnosis Date  . CHF (congestive heart failure) (HCC)   . CKD (chronic kidney disease), stage III   . Diabetes mellitus   . ED (erectile dysfunction)   . Hypertension     Past Surgical History:  Procedure Laterality Date  . ANKLE SURGERY     both   . APPENDECTOMY    . back fusion     l4,l5  . BACK SURGERY    . LAPAROSCOPIC APPENDECTOMY N/A 03/28/2019   Procedure: APPENDECTOMY LAPAROSCOPIC;  Surgeon: Sung Amabile, DO;  Location: [facility] ORS;  Service: General;  Laterality: N/A;  . NASAL SINUS SURGERY      Current Medications: Current Meds  Medication Sig  . atorvastatin (LIPITOR) 40 MG tablet Take 1 tablet (40 mg total) by mouth daily at 6 PM.  . Blood Pressure Monitoring (BLOOD PRESSURE MONITOR/L CUFF) MISC 1 Units by Does not apply route as needed.  . DULoxetine (CYMBALTA) 20 MG capsule Take 2 capsules by mouth daily.  Marland Kitchen NOVOLIN 70/30 FLEXPEN (70-30) 100 UNIT/ML PEN Inject into the skin 2 (two) times daily. Sliding scale  . potassium chloride SA (KLOR-CON) 20 MEQ tablet Take 1 tablet (20 mEq  total) by mouth 2 (two) times daily.  Marland Kitchen rOPINIRole (REQUIP) 0.25 MG tablet Take 1 tablet by mouth 3 (three) times daily.  . sacubitril-valsartan (ENTRESTO) 24-26 MG Take 1 tablet by mouth 2 (two) times daily.  Marland Kitchen torsemide (DEMADEX) 20 MG tablet Take 1 tablet (20 mg total) by mouth daily.  . [DISCONTINUED] carvedilol (COREG) 3.125 MG tablet Take 1 tablet (3.125 mg total) by mouth 2 (two) times daily with a meal.     Allergies:   Food   Social History   Socioeconomic History  . Marital status: Married    Spouse name: Not on file  . Number of children: Not on file  . Years of education: Not on file  . Highest education level: Not on file  Occupational History  . Occupation: unemployed  Tobacco Use  . Smoking status: Never Smoker  . Smokeless tobacco: Never Used  Substance and Sexual Activity  . Alcohol use: No  . Drug use: No  . Sexual activity: Yes    Birth control/protection: None  Other Topics Concern  . Not on file  Social History Narrative  . Not on file   Social Determinants of Health   Financial Resource Strain: Low Risk   . Difficulty of Paying Living Expenses: Not hard at all  Food Insecurity: No Food Insecurity  . Worried About Programme researcher, broadcasting/film/video in  the Last Year: Never true  . Ran Out of Food in the Last Year: Never true  Transportation Needs: Unknown  . Lack of Transportation (Medical): Not on file  . Lack of Transportation (Non-Medical): No  Physical Activity: Unknown  . Days of Exercise per Week: 4 days  . Minutes of Exercise per Session: Not on file  Stress: No Stress Concern Present  . Feeling of Stress : Not at all  Social Connections: Slightly Isolated  . Frequency of Communication with Friends and Family: More than three times a week  . Frequency of Social Gatherings with Friends and Family: More than three times a week  . Attends Religious Services: 1 to 4 times per year  . Active Member of Clubs or Organizations: No  . Attends Tax inspector Meetings: Never  . Marital Status: Married     Family History: The patient's family history includes Diabetes in his father; Hypertension in his mother.  ROS:   Please see the history of present illness.     All other systems reviewed and are negative.  EKGs/Labs/Other Studies Reviewed:    The following studies were reviewed today: TTE 10/13/19 1. Left ventricular ejection fraction, by visual estimation, is <20%. The left ventricle has severely decreased function. Mildly increased left ventricular size. There is mildly increased left ventricular hypertrophy.  2. Elevated mean left atrial pressure.  3. Left ventricular diastolic Doppler parameters are consistent with restrictive filling pattern of LV diastolic filling.  4. Global right ventricle has moderately reduced systolic function.The right ventricular size is normal. No increase in right ventricular wall thickness.  5. Left atrial size was moderately dilated.  6. Right atrial size was mildly dilated.  7. The mitral valve is normal in structure. Mild mitral valve regurgitation.  8. The tricuspid valve is not well visualized. Tricuspid valve regurgitation is trivial.  9. The aortic valve is tricuspid Aortic valve regurgitation was not visualized by color flow Doppler. Structurally normal aortic valve, with no evidence of sclerosis or stenosis. 10. The pulmonic valve was grossly normal. Pulmonic valve regurgitation is trivial by color flow Doppler. 11. TR signal is inadequate for assessing pulmonary artery systolic pressure. 12. The inferior vena cava is normal in size with <50% respiratory variability, suggesting right atrial pressure of 8 mmHg. 13. The interatrial septum was not well visualized.  pharm myocardial perfusion imaging stress test  T wave inversion was noted during stress in the II, III, aVF, V5 and V6 leads.  This is an intermediate risk study.  The left ventricular ejection fraction is severely  decreased (<30%). Correlate with other modality such as Echocardiogram  There was a very small partially reversible apical defect. This is likely due to apical thining or artifact  No evidence of ischemia was otherwise noted on this study  EKG:  EKG is  ordered today.  The ekg ordered today demonstrates normal sinus rhythm, nonspecific T wave changes.  Recent Labs: October 13, 2019: B Natriuretic Peptide 1,004.0; Hemoglobin 13.9; Platelets 166; TSH 1.207 09/21/2019: ALT 27; Pro B Natriuretic peptide (BNP) 62.0 11/10/2019: BUN 23; Creatinine, Ser 1.36; Magnesium 1.8; Potassium 3.2; Sodium 138  Recent Lipid Panel    Component Value Date/Time   CHOL 118 10/13/2019 0533   TRIG 49 10/13/19 0533   HDL 48 10/13/2019 0533   CHOLHDL 2.5 October 13, 2019 0533   VLDL 10 10-13-19 0533   LDLCALC 60 October 13, 2019 0533    Physical Exam:    VS:  BP 110/76 (BP Location: Left Arm, Patient  Position: Sitting, Cuff Size: Normal)   Pulse 79   Ht 6\' 1"  (1.854 m)   Wt 231 lb (104.8 kg)   SpO2 98%   BMI 30.48 kg/m     Wt Readings from Last 3 Encounters:  02/29/20 231 lb (104.8 kg)  12/14/19 223 lb (101.2 kg)  12/08/19 221 lb 9.6 oz (100.5 kg)     GEN:  Well nourished, well developed in no acute distress HEENT: Normal NECK: No JVD; No carotid bruits LYMPHATICS: No lymphadenopathy CARDIAC: RRR, no murmurs, rubs, gallops RESPIRATORY:  Clear to auscultation without rales, wheezing or rhonchi  ABDOMEN: Soft, non-tender, non-distended MUSCULOSKELETAL:  No edema; No deformity  SKIN: Warm and dry NEUROLOGIC:  Alert and oriented x 3 PSYCHIATRIC:  Normal affect   ASSESSMENT:   . 1. NICM (nonischemic cardiomyopathy) ([place])   2. Essential hypertension    PLAN:     1. 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  His original EF was less than 20%.  He is euvolemic.  Repeat echocardiogram showed EF 40 to 45%.  Continue Entresto at 24-26 mg twice daily, increase Coreg  to 6.25 twice daily.  Continue torsemide 20 milligrams daily.  Get repeat echocardiogram in 3 months.  2.  History of hypertension.  Blood pressure well controlled.  Entresto and Coreg as above  Follow-up in 3 months  Total encounter time more than 35 minutes  Greater than 50% was spent in counseling and coordination of care with the patient  This note was generated in part or whole with voice recognition software. Voice recognition is usually quite accurate but there are transcription errors that can and very often do occur. I apologize for any typographical errors that were not detected and corrected.  Medication Adjustments/Labs and Tests Ordered: Current medicines are reviewed at length with the patient today.  Concerns regarding medicines are outlined above.  Orders Placed This Encounter  Procedures  . EKG 12-Lead  . ECHOCARDIOGRAM COMPLETE   Meds ordered this encounter  Medications  . carvedilol (COREG) 6.25 MG tablet    Sig: Take 1 tablet (6.25 mg total) by mouth 2 (two) times daily.    Dispense:  180 tablet    Refill:  2    Patient Instructions  Medication Instructions:  Your physician has recommended you make the following change in your medication:  1- INCREASE Carvedilol to 6.25 mg by mouth two times a day.  *If you need a refill on your cardiac medications before your next appointment, please call your pharmacy*   Lab Work: none If you have labs (blood work) drawn today and your tests are completely normal, you will receive your results only by: Marland Kitchen MyChart Message (if you have MyChart) OR . A paper copy in the mail If you have any lab test that is abnormal or we need to change your treatment, we will call you to review the results.   Testing/Procedures: 1- ECHOCARDIOGRAM IN 3 MONTHS PRIOR TO APPT - Your physician has requested that you have an echocardiogram. Echocardiography is a painless test that uses sound waves to create images of your heart. It provides  your doctor with information about the size and shape of your heart and how well your heart's chambers and valves are working. This procedure takes approximately one hour. There are no restrictions for this procedure.   Follow-Up: At [facility], you and your health needs are our priority.  As part of our continuing mission to provide you with exceptional  heart care, we have created designated Provider Care Teams.  These Care Teams include your primary Cardiologist (physician) and Advanced Practice Providers (APPs -  Physician Assistants and Nurse Practitioners) who all work together to provide you with the care you need, when you need it.  We recommend signing up for the patient portal called "MyChart".  Sign up information is provided on this After Visit Summary.  MyChart is used to connect with patients for Virtual Visits (Telemedicine).  Patients are able to view lab/test results, encounter notes, upcoming appointments, etc.  Non-urgent messages can be sent to your provider as well.   To learn more about what you can do with MyChart, go to [email or web address].    Your next appointment:   3 month(s) following echo in 3 months.  The format for your next appointment:   In Person  Provider:   Debbe Odea, MD      Signed, Debbe Odea, MD  02/29/2020 12:16 PM    [facility]

## 2020-02-29 NOTE — Patient Instructions (Signed)
Medication Instructions:  Your physician has recommended you make the following change in your medication:  1- INCREASE Carvedilol to 6.25 mg by mouth two times a day.  *If you need a refill on your cardiac medications before your next appointment, please call your pharmacy*   Lab Work: none If you have labs (blood work) drawn today and your tests are completely normal, you will receive your results only by: Marland Kitchen MyChart Message (if you have MyChart) OR . A paper copy in the mail If you have any lab test that is abnormal or we need to change your treatment, we will call you to review the results.   Testing/Procedures: 1- ECHOCARDIOGRAM IN 3 MONTHS PRIOR TO APPT - Your physician has requested that you have an echocardiogram. Echocardiography is a painless test that uses sound waves to create images of your heart. It provides your doctor with information about the size and shape of your heart and how well your heart's chambers and valves are working. This procedure takes approximately one hour. There are no restrictions for this procedure.   Follow-Up: At [facility], you and your health needs are our priority.  As part of our continuing mission to provide you with exceptional heart care, we have created designated Provider Care Teams.  These Care Teams include your primary Cardiologist (physician) and Advanced Practice Providers (APPs -  Physician Assistants and Nurse Practitioners) who all work together to provide you with the care you need, when you need it.  We recommend signing up for the patient portal called "MyChart".  Sign up information is provided on this After Visit Summary.  MyChart is used to connect with patients for Virtual Visits (Telemedicine).  Patients are able to view lab/test results, encounter notes, upcoming appointments, etc.  Non-urgent messages can be sent to your provider as well.   To learn more about what you can do with MyChart, go to [email or web address].     Your next appointment:   3 month(s) following echo in 3 months.  The format for your next appointment:   In Person  Provider:   Debbe Odea, MD

## 2020-03-07 ENCOUNTER — Ambulatory Visit: Payer: Medicare Other | Admitting: Cardiology

## 2020-03-08 ENCOUNTER — Other Ambulatory Visit: Payer: Self-pay

## 2020-03-08 ENCOUNTER — Encounter: Payer: Medicare Other | Admitting: *Deleted

## 2020-03-08 DIAGNOSIS — I5022 Chronic systolic (congestive) heart failure: Secondary | ICD-10-CM

## 2020-03-08 DIAGNOSIS — Z79899 Other long term (current) drug therapy: Secondary | ICD-10-CM | POA: Diagnosis not present

## 2020-03-08 DIAGNOSIS — I110 Hypertensive heart disease with heart failure: Secondary | ICD-10-CM | POA: Diagnosis not present

## 2020-03-08 DIAGNOSIS — E119 Type 2 diabetes mellitus without complications: Secondary | ICD-10-CM | POA: Diagnosis not present

## 2020-03-08 NOTE — Progress Notes (Signed)
Daily Session Note  Patient Details  Name: KALLIN HENK MRN: [id number] Date of Birth: Dec 25, 1975 Referring Provider:     Cardiac Rehab from 11/04/2019 in [facility]  Referring Provider  [name]      Encounter Date: 03/08/2020  Check In: Session Check In - 03/08/20 1010      Check-In   Supervising physician immediately available to respond to emergencies  See telemetry face sheet for immediately available ER MD    Location  [facility]-Cardiac & Pulmonary Rehab    Staff Present  Heath Lark, RN, BSN, CCRP;Amanda Sommer, BA, ACSM CEP, Exercise Physiologist;Joseph Hood RCP,RRT,BSRT    Virtual Visit  No    Medication changes reported      No    Fall or balance concerns reported     No    Warm-up and Cool-down  Performed on first and last piece of equipment    Resistance Training Performed  Yes    VAD Patient?  No    PAD/SET Patient?  No      Pain Assessment   Currently in Pain?  No/denies          Social History   Tobacco Use  Smoking Status Never Smoker  Smokeless Tobacco Never Used    Goals Met:  Independence with exercise equipment Exercise tolerated well No report of cardiac concerns or symptoms  Goals Unmet:  Not Applicable  Comments: Pt able to follow exercise prescription today without complaint.  Will continue to monitor for progression.    Dr. Emily Filbert is Medical Director for [facility].

## 2020-03-09 ENCOUNTER — Other Ambulatory Visit: Payer: Self-pay | Admitting: Urology

## 2020-03-09 ENCOUNTER — Other Ambulatory Visit: Payer: Self-pay

## 2020-03-09 ENCOUNTER — Telehealth (INDEPENDENT_AMBULATORY_CARE_PROVIDER_SITE_OTHER): Payer: Medicare Other | Admitting: Urology

## 2020-03-09 ENCOUNTER — Encounter: Payer: Self-pay | Admitting: *Deleted

## 2020-03-09 DIAGNOSIS — N5201 Erectile dysfunction due to arterial insufficiency: Secondary | ICD-10-CM | POA: Diagnosis not present

## 2020-03-09 DIAGNOSIS — I5022 Chronic systolic (congestive) heart failure: Secondary | ICD-10-CM

## 2020-03-09 MED ORDER — SILDENAFIL CITRATE 100 MG PO TABS: 100.0000 mg | ORAL_TABLET | Freq: Every day | ORAL | 2 refills | Status: DC | PRN

## 2020-03-09 NOTE — Progress Notes (Signed)
Virtual Visit via Telephone Note  I connected with [name] on 03/09/20 at  2:00 PM EDT by telephone and verified that I am speaking with the correct person using two identifiers.  Location: Patient: Home Provider: Office   I discussed the limitations, risks, security and privacy concerns of performing an evaluation and management service by telephone and the availability of in person appointments. I also discussed with the patient that there may be a patient responsible charge related to this service. The patient expressed understanding and agreed to proceed.   History of Present Illness: Mr.  [name] is a 44 year old male with cardiomyopathy, HTN, DM, HLD and ED who is contacted today to discuss his options for ED treatment.    He was initially seen by Dr. Lonna Cobb on May 11, 2019 for his erectile dysfunction.  At that time he was using up to 80 mg of generic sildenafil without improvement.  He was given a trial of tadalafil which was also found to be ineffective.  I discussed intracavernosal injections with him in July 2020, but he did not proceed with this plan.  He states that he has recently seen his cardiologist, Dr. Azucena Cecil, and his ejection fraction has improved and he would like to try the 100 mg sildenafil.    He states he is no longer having spontaneous erections.  He states when he was having erections they were neither curved or painful.   Observations/Objective: Patient does not sound distressed and is answering questions appropriately on the phone.  Assessment and Plan:  1. ED Will contact Dr. Azucena Cecil to see if visit appropriate from a cardiovascular standpoint to engage in intercourse and to prescribe 100 mg sildenafil, if so I will go ahead and send the prescription to [facility] and have the patient follow-up in 1 month time for SHIM score.   Follow Up Instructions:  Follow-up pending cardiac clearance for sexual activity and the sildenafil 100 mg  prescription  I discussed the assessment and treatment plan with the patient. The patient was provided an opportunity to ask questions and all were answered. The patient agreed with the plan and demonstrated an understanding of the instructions.   The patient was advised to call back or seek an in-person evaluation if the symptoms worsen or if the condition fails to improve as anticipated.  I provided 10 minutes of non-face-to-face time during this encounter.   Mackena Plummer, PA-C

## 2020-03-09 NOTE — Progress Notes (Signed)
Cardiac Individual Treatment Plan  Patient Details  Name: [name] MRN: [id number] Date of Birth: 1975-12-24 Referring Provider:     Cardiac Rehab from 11/04/2019 in [facility] Cardiac and Pulmonary Rehab  Referring Provider  [name]      Initial Encounter Date:    Cardiac Rehab from 11/04/2019 in [facility] Cardiac and Pulmonary Rehab  Date  11/04/19      Visit Diagnosis: Heart failure, chronic systolic (HCC)  Patient's Home Medications on Admission:  Current Outpatient Medications:  .  atorvastatin (LIPITOR) 40 MG tablet, Take 1 tablet (40 mg total) by mouth daily at 6 PM., Disp: 90 tablet, Rfl: 0 .  Blood Pressure Monitoring (BLOOD PRESSURE MONITOR/L CUFF) MISC, 1 Units by Does not apply route as needed., Disp: 1 each, Rfl: 0 .  carvedilol (COREG) 6.25 MG tablet, Take 1 tablet (6.25 mg total) by mouth 2 (two) times daily., Disp: 180 tablet, Rfl: 2 .  DULoxetine (CYMBALTA) 20 MG capsule, Take 2 capsules by mouth daily., Disp: , Rfl:  .  NOVOLIN 70/30 FLEXPEN (70-30) 100 UNIT/ML PEN, Inject into the skin 2 (two) times daily. Sliding scale, Disp: , Rfl:  .  potassium chloride SA (KLOR-CON) 20 MEQ tablet, Take 1 tablet (20 mEq total) by mouth 2 (two) times daily., Disp: 60 tablet, Rfl: 0 .  rOPINIRole (REQUIP) 0.25 MG tablet, Take 1 tablet by mouth 3 (three) times daily., Disp: , Rfl:  .  sacubitril-valsartan (ENTRESTO) 24-26 MG, Take 1 tablet by mouth 2 (two) times daily., Disp: 60 tablet, Rfl: 3 .  torsemide (DEMADEX) 20 MG tablet, Take 1 tablet (20 mg total) by mouth daily., Disp: 90 tablet, Rfl: 3  Past Medical History: Past Medical History:  Diagnosis Date  . CHF (congestive heart failure) ([place])   . CKD (chronic kidney disease), stage III   . Diabetes mellitus   . ED (erectile dysfunction)   . Hypertension     Tobacco Use: Social History   Tobacco Use  Smoking Status Never Smoker  Smokeless Tobacco Never Used    Labs: Recent Review Flowsheet Data    Labs for  ITP Cardiac and Pulmonary Rehab Latest Ref Rng & Units 04/25/2011 02/09/2012 05/26/2018 09/14/2019 09/15/2019   Cholestrol 0 - 200 mg/dL - - 177 - 118   LDLCALC 0 - 99 mg/dL - - 104(H) - 60   HDL >40 mg/dL - - 57.00 - 48   Trlycerides <150 mg/dL - - 81.0 - 49   Hemoglobin A1c 4.8 - 5.6 % - - 13.4(A) 7.4(H) 7.5(H)   TCO2 0 - 100 mmol/L 30 29 - - -       Exercise Target Goals: Exercise Program Goal: Individual exercise prescription set using results from initial 6 min walk test and THRR while considering  patient's activity barriers and safety.   Exercise Prescription Goal: Initial exercise prescription builds to 30-45 minutes a day of aerobic activity, 2-3 days per week.  Home exercise guidelines will be given to patient during program as part of exercise prescription that the participant will acknowledge.   Education: Aerobic Exercise & Resistance Training: - Gives group verbal and written instruction on the various components of exercise. Focuses on aerobic and resistive training programs and the benefits of this training and how to safely progress through these programs..   Education: Exercise & Equipment Safety: - Individual verbal instruction and demonstration of equipment use and safety with use of the equipment.   Education: Exercise Physiology & General Exercise Guidelines: - Group  verbal and written instruction with models to review the exercise physiology of the cardiovascular system and associated critical values. Provides general exercise guidelines with specific guidelines to those with heart or lung disease.    Education: Flexibility, Balance, Mind/Body Relaxation: Provides group verbal/written instruction on the benefits of flexibility and balance training, including mind/body exercise modes such as yoga, pilates and tai chi.  Demonstration and skill practice provided.   Activity Barriers & Risk Stratification: Activity Barriers & Cardiac Risk Stratification - 11/02/19  1556      Activity Barriers & Cardiac Risk Stratification   Activity Barriers  Arthritis;Back Problems;Shortness of Breath;Assistive Device       6 Minute Walk: 6 Minute Walk    Row Name 11/04/19 1605         6 Minute Walk   Phase  Initial     Distance  1242 feet     Walk Time  6 minutes     # of Rest Breaks  0     MPH  2.35     METS  4.4     RPE  12     VO2 Peak  15.41     Symptoms  Yes (comment)     Comments  6 minute walk test completed today. Patient did have some chest tightness that he reports as a regular occurence that his Cardiologist is aware of and attributes to his low EF. Participant felt better after rest and moving on to balance test and resistance training.     Resting HR  74 bpm     Resting BP  108/84     Exercise Oxygen Saturation  during 6 min walk  99 %     Max Ex. HR  122 bpm     Max Ex. BP  128/80     2 Minute Post BP  110/80        Oxygen Initial Assessment:   Oxygen Re-Evaluation:   Oxygen Discharge (Final Oxygen Re-Evaluation):   Initial Exercise Prescription: Initial Exercise Prescription - 11/04/19 1600      Date of Initial Exercise RX and Referring Provider   Date  11/04/19    Referring Provider  [name]      Treadmill   MPH  2.2    Grade  0    Minutes  15    METs  2.5      Recumbant Bike   Level  2    RPM  60    Watts  20    Minutes  15    METs  2.5      T5 Nustep   Level  2    SPM  60    Minutes  15    METs  2      Biostep-RELP   Level  2    SPM  50    Minutes  15    METs  2      Prescription Details   Frequency (times per week)  3    Duration  Progress to 30 minutes of continuous aerobic without signs/symptoms of physical distress      Intensity   THRR 40-80% of Max Heartrate  114-155    Ratings of Perceived Exertion  11-15    Perceived Dyspnea  0-4      Progression   Progression  Continue progressive overload as per policy without signs/symptoms or physical distress.      Resistance Training    Training Prescription  Yes  Weight  3    Reps  10-15       Perform Capillary Blood Glucose checks as needed.  Exercise Prescription Changes: Exercise Prescription Changes    Row Name 11/10/19 1400 11/27/19 1000 12/09/19 1400 12/23/19 1200 01/05/20 1300     Response to Exercise   Blood Pressure (Admit)  146/64  100/64  138/70  128/84  134/70   Blood Pressure (Exercise)  --  112/60  138/82  152/76  136/64   Blood Pressure (Exit)  120/70  94/60  90/60  130/70  116/60   Heart Rate (Admit)  85 bpm  87 bpm  114 bpm  78 bpm  97 bpm   Heart Rate (Exercise)  112 bpm  113 bpm  140 bpm  124 bpm  121 bpm   Heart Rate (Exit)  93 bpm  89 bpm  97 bpm  110 bpm  105 bpm   Rating of Perceived Exertion (Exercise)  _0 Symptoms  none  fatigue on elliptical  --  hip pain on elliptical  --   Comments  1st day  --  --  --  --   Duration  Progress to 30 minutes of  aerobic without signs/symptoms of physical distress  Continue with 30 min of aerobic exercise without signs/symptoms of physical distress.  Continue with 30 min of aerobic exercise without signs/symptoms of physical distress.  Continue with 30 min of aerobic exercise without signs/symptoms of physical distress.  Continue with 30 min of aerobic exercise without signs/symptoms of physical distress.   Intensity  THRR unchanged  THRR unchanged  THRR unchanged  THRR unchanged  THRR unchanged     Progression   Progression  Continue to progress workloads to maintain intensity without signs/symptoms of physical distress.  Continue to progress workloads to maintain intensity without signs/symptoms of physical distress.  Continue to progress workloads to maintain intensity without signs/symptoms of physical distress.  Continue to progress workloads to maintain intensity without signs/symptoms of physical distress.  Continue to progress workloads to maintain intensity without signs/symptoms of physical distress.   Average METs  2.7  2.91   2.7  2.94  3.25     Resistance Training   Training Prescription  Yes  Yes  Yes  Yes  Yes   Weight  3 lb  3 lb  3 lb  3 lb  8 lb   Reps  10-15  10-15  10-15  10-15  10-15     Interval Training   Interval Training  --  No  No  No  No     Treadmill   MPH  --  3  2.2  2.2  2.3   Grade  --  1  0  0  0   Minutes  --  _1 METs  --  3.71  2.69  2.68  2.69     Recumbant Bike   Level  1  --  --  7  --   RPM  60  --  --  --  --   Watts  25  --  --  40  --   Minutes  15  --  --  15  --   METs  2.8  --  --  3.26  --     NuStep   Level  2  --  --  5  --   SPM  80  --  --  --  --   Minutes  15  --  --  15  --   METs  2.1  --  --  2.9  --     Elliptical   Level  --  1  1  --  --   Speed  --  2.5  2.5  --  --   Minutes  --  10 2-5 min bouts  10 short bouts as tolerated  --  --     REL-XR   Level  --  --  --  --  4   Minutes  --  --  --  --  15   METs  --  --  --  --  3.8     T5 Nustep   Level  --  2  --  --  --   Minutes  --  15  --  --  --   METs  --  2.1  --  --  --     Home Exercise Plan   Plans to continue exercise at  --  --  --  Home (comment) walking  --   Frequency  --  --  --  Add 3 additional days to program exercise sessions.  --   Initial Home Exercises Provided  --  --  --  12/10/19  --   [place] Name 01/20/20 1300 02/03/20 1500 02/16/20 1500 03/03/20 1300       Response to Exercise   Blood Pressure (Admit)  122/72  128/82  124/70  110/74    Blood Pressure (Exercise)  144/80  142/82  136/64  132/68    Blood Pressure (Exit)  108/64  102/78  134/64  122/62    Heart Rate (Admit)  86 bpm  95 bpm  104 bpm  93 bpm    Heart Rate (Exercise)  135 bpm  127 bpm  130 bpm  116 bpm    Heart Rate (Exit)  103 bpm  89 bpm  107 bpm  91 bpm    Rating of Perceived Exertion (Exercise)  _0 Symptoms  --  --  hip pain  --    Duration  Continue with 30 min of aerobic exercise without signs/symptoms of physical distress.  Continue with 30 min of aerobic exercise  without signs/symptoms of physical distress.  Continue with 30 min of aerobic exercise without signs/symptoms of physical distress.  Continue with 30 min of aerobic exercise without signs/symptoms of physical distress.    Intensity  THRR unchanged  THRR unchanged  THRR unchanged  THRR unchanged      Progression   Progression  Continue to progress workloads to maintain intensity without signs/symptoms of physical distress.  Continue to progress workloads to maintain intensity without signs/symptoms of physical distress.  Continue to progress workloads to maintain intensity without signs/symptoms of physical distress.  Continue to progress workloads to maintain intensity without signs/symptoms of physical distress.    Average METs  3.13  2.6  2.7  3.23      Resistance Training   Training Prescription  Yes  Yes  Yes  --    Weight  8 lb  8 lb  8 lb  --    Reps  10-15  10-15  10-15  --      Interval Training   Interval Training  No  No  No  --      Treadmill  MPH  2.3  1.9  2.3  --    Grade  0  0  0  --    Minutes  _0 --    METs  2.76  --  2.76  --      REL-XR   Level  4  --  --  --    Minutes  15  --  --  --    METs  3.5  --  --  --      T5 Nustep   Level  --  5  --  --    SPM  --  80  --  --    Minutes  --  15  --  --    METs  --  2.5  --  --      Home Exercise Plan   Plans to continue exercise at  Home (comment) walking  Home (comment) walking  Home (comment) walking  --    Frequency  Add 3 additional days to program exercise sessions.  Add 3 additional days to program exercise sessions.  Add 3 additional days to program exercise sessions.  --    Initial Home Exercises Provided  12/10/19  12/10/19  12/10/19  --       Exercise Comments: Exercise Comments    Row Name 11/09/19 4401 11/26/19 1049 01/19/20 1025 01/19/20 1118     Exercise Comments  First full day of exercise!  Patient was oriented to gym and equipment including functions, settings, policies, and  procedures.  Patient's individual exercise prescription and treatment plan were reviewed.  All starting workloads were established based on the results of the 6 minute walk test done at initial orientation visit.  The plan for exercise progression was also introduced and progression will be customized based on patient's performance and goals.  Rons sciatica has been bothering him.  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  Staff showed Ron stretches (modified figure 4 )  and recommended PT consult in the meantime.  Corbin is experiencing left shoulder discomfort.  Thinks he pulled a muscle. Movement of shoulder creates discomfort.  Advised to call MD if this continues or gets worse. Also advised not to use left arm to hard today. He verbalized understanding.  Eriq stated he did fine today, no further discomfort while exercising.       Exercise Goals and Review: Exercise Goals    Row Name 11/04/19 1627             Exercise Goals   Increase Physical Activity  Yes       Intervention  Provide advice, education, support and counseling about physical activity/exercise needs.;Develop an individualized exercise prescription for aerobic and resistive training based on initial evaluation findings, risk stratification, comorbidities and participant's personal goals.       Expected Outcomes  Short Term: Attend rehab on a regular basis to increase amount of physical activity.;Long Term: Add in home exercise to make exercise part of routine and to increase amount of physical activity.;Long Term: Exercising regularly at least 3-5 days a week.       Increase Strength and Stamina  Yes       Intervention  Provide advice, education, support and counseling about physical activity/exercise needs.;Develop an individualized exercise prescription for aerobic and resistive training based on initial evaluation findings, risk stratification, comorbidities and participant's  personal goals.  Expected Outcomes  Short Term: Increase workloads from initial exercise prescription for resistance, speed, and METs.;Short Term: Perform resistance training exercises routinely during rehab and add in resistance training at home;Long Term: Improve cardiorespiratory fitness, muscular endurance and strength as measured by increased METs and functional capacity (6MWT)       Able to understand and use rate of perceived exertion (RPE) scale  Yes       Intervention  Provide education and explanation on how to use RPE scale       Expected Outcomes  Short Term: Able to use RPE daily in rehab to express subjective intensity level;Long Term:  Able to use RPE to guide intensity level when exercising independently       Able to understand and use Dyspnea scale  Yes       Intervention  Provide education and explanation on how to use Dyspnea scale       Expected Outcomes  Short Term: Able to use Dyspnea scale daily in rehab to express subjective sense of shortness of breath during exertion;Long Term: Able to use Dyspnea scale to guide intensity level when exercising independently       Knowledge and understanding of Target Heart Rate Range (THRR)  Yes       Intervention  Provide education and explanation of THRR including how the numbers were predicted and where they are located for reference       Expected Outcomes  Short Term: Able to state/look up THRR;Long Term: Able to use THRR to govern intensity when exercising independently;Short Term: Able to use daily as guideline for intensity in rehab       Able to check pulse independently  Yes       Intervention  Provide education and demonstration on how to check pulse in carotid and radial arteries.;Review the importance of being able to check your own pulse for safety during independent exercise       Expected Outcomes  Short Term: Able to explain why pulse checking is important during independent exercise;Long Term: Able to check pulse  independently and accurately       Understanding of Exercise Prescription  Yes       Intervention  Provide education, explanation, and written materials on patient's individual exercise prescription       Expected Outcomes  Short Term: Able to explain program exercise prescription;Long Term: Able to explain home exercise prescription to exercise independently          Exercise Goals Re-Evaluation : Exercise Goals Re-Evaluation    Row Name 11/09/19 8242 11/27/19 1035 12/09/19 1457 12/10/19 1041 12/23/19 1211     Exercise Goal Re-Evaluation   Exercise Goals Review  Able to understand and use rate of perceived exertion (RPE) scale;Able to understand and use Dyspnea scale;Able to check pulse independently;Understanding of Exercise Prescription  Increase Physical Activity;Increase Strength and Stamina;Understanding of Exercise Prescription  Increase Physical Activity;Increase Strength and Stamina;Able to understand and use rate of perceived exertion (RPE) scale;Able to understand and use Dyspnea scale;Able to check pulse independently;Knowledge and understanding of Target Heart Rate Range (THRR);Understanding of Exercise Prescription  Increase Physical Activity;Increase Strength and Stamina;Understanding of Exercise Prescription  Increase Physical Activity;Increase Strength and Stamina;Understanding of Exercise Prescription   Comments  Reviewed RPE scale, THR and program prescription with pt today.  Pt voiced understanding and was given a copy of goals to take home.  Toryn is off to a good start in rehab. He tried the treadmill and elliptical yesterday.  He  did well on the treadmill, but the elliptical was challenging.  He also complained of some sciattica and hip pain. 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  We talked with him about stretching and strengthening his hip to help avoid surgery.  We also recommended a new PT consult to help as well.  He was open to all  options.  We will continue to monitor his hip and progress.  Ron finds the elliptical very challenging.  He is doing short bouts on the elliptical  Ron is doing well in rehab.  He has not been doing much at home.  He usually stretches and walks around block for about 10 min. Reviewed home exercise with pt today.  Pt plans to walk and stretch at home for exercise.  Reviewed THR, pulse, RPE, sign and symptoms, NTG use, and when to call 911 or MD.  Also discussed weather considerations and indoor options.  Pt voiced understanding.  Ron has been doing well in rehab.  He was able to do well on the bike and we have moved him off the elliptical as it really aggravated his hip.  We will continue to montior his progress.   Expected Outcomes  Short: Use RPE daily to regulate intensity. Long: Follow program prescription in THR.  Short: Try stretching and strengthen to support hip.  Long: Continue to follow program prescription and attend regularly  Short - build endurance on elliptical Long:  improve overall stamina  Short: Increase walking time at home. Long: Continue to improve stamina.  Short: Increase workload on treadmill.  Long: Continue to add in exercise at home.   [place] Name 12/29/19 1031 01/05/20 1308 01/20/20 1344 02/03/20 1552 02/16/20 1525     Exercise Goal Re-Evaluation   Exercise Goals Review  Increase Physical Activity;Increase Strength and Stamina;Understanding of Exercise Prescription  Increase Physical Activity;Increase Strength and Stamina;Able to understand and use rate of perceived exertion (RPE) scale;Able to understand and use Dyspnea scale;Knowledge and understanding of Target Heart Rate Range (THRR);Able to check pulse independently;Understanding of Exercise Prescription  Increase Physical Activity;Increase Strength and Stamina;Understanding of Exercise Prescription  Increase Physical Activity;Increase Strength and Stamina;Able to understand and use rate of perceived exertion (RPE) scale;Able to  understand and use Dyspnea scale;Knowledge and understanding of Target Heart Rate Range (THRR);Able to check pulse independently;Understanding of Exercise Prescription  Increase Physical Activity;Increase Strength and Stamina;Understanding of Exercise Prescription   Comments  Weighted sit-ups 5 on each arm - will do 20 minutes 3x/week. Does laps around the store and other unstructured exercise. Pt reports walking on road hurts his feet and he is fatigued and his body is hurting (leg and back).  Ron has increased to 8 ob weights fro strength work and lincreased TM speed.  He is exercsising some at home.  Staff will monitor progress.  Ron continues to do well in rehab.  Yesterday we limited his arms as he had been having some shoulder pain that seemed muscular in nature.  He is up to level 4 on the XR.  We will continue to monitor his progress.  Ron has only attended twice in March.  Consistent exercise will yield more progress.  Ron has only attended once since last review (3 total for March).  He is able to come in to complete exercise but lacks progression due to lack of consistent attendance.  We will continue to monitor his progress.   Expected Outcomes  Short: Increase workload on  treadmill.  Long: Continue to add in exercise at home.  --  Short: Add incline to treadmill if hip able to tolerate.  Long: Continue to improve stamina.  Short : attend/exercise consistently Long: improve overall MET level  Short: Attend regularly  Long: Continue to improve stamina.   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  Ron has not had regular attendance.  He is active outside program sessions      Expected Outcomes  Short: Attend regularly  Long: Continue to improve stamina.  Short : attend regulalry Long: increase overall MET level         Discharge Exercise Prescription (Final Exercise Prescription Changes): Exercise Prescription Changes - 03/03/20 1300      Response to Exercise   Blood Pressure (Admit)  110/74    Blood Pressure (Exercise)  132/68    Blood Pressure (Exit)  122/62    Heart Rate (Admit)  93 bpm    Heart Rate (Exercise)  116 bpm    Heart Rate (Exit)  91 bpm    Rating of Perceived Exertion (Exercise)  12    Duration  Continue with 30 min of aerobic exercise without signs/symptoms of physical distress.    Intensity  THRR unchanged      Progression   Progression  Continue to progress workloads to maintain intensity without signs/symptoms of physical distress.    Average METs  3.23       Nutrition:  Target Goals: Understanding of nutrition guidelines, daily intake of sodium <1523m, cholesterol <2065m calories 30% from fat and 7% or less from saturated fats, daily to have 5 or more servings of fruits and vegetables.  Education: Controlling Sodium/Reading Food Labels -Group verbal and written material supporting the discussion of sodium use in heart healthy nutrition. Review and explanation with models, verbal and written materials for utilization of the food label.   Education: General Nutrition Guidelines/Fats and Fiber: -Group instruction provided by verbal, written material, models and posters to present the general guidelines for heart healthy nutrition. Gives an explanation and review of dietary fats and fiber.   Biometrics: Pre Biometrics - 11/04/19 1631      Pre Biometrics   Height  6' 1.5"  (1.867 m)    Weight  221 lb 14.4 oz (100.7 kg)    BMI (Calculated)  28.88    Single Leg Stand  30 seconds        Nutrition Therapy Plan and Nutrition Goals:   Nutrition Assessments:   MEDIFICTS Score Key:          ?70 Need to make dietary changes          40-70 Heart Healthy Diet         ? 40 Therapeutic Level Cholesterol Diet  Nutrition Goals Re-Evaluation:   Nutrition Goals Discharge (Final Nutrition Goals Re-Evaluation):   Psychosocial: Target Goals: Acknowledge presence or absence of significant depression and/or stress, maximize coping skills, provide positive support system. Participant is able  to verbalize types and ability to use techniques and skills needed for reducing stress and depression.   Education: Depression - Provides group verbal and written instruction on the correlation between heart/lung disease and depressed mood, treatment options, and the stigmas associated with seeking treatment.   Education: Sleep Hygiene -Provides group verbal and written instruction about how sleep can affect your health.  Define sleep hygiene, discuss sleep cycles and impact of sleep habits. Review good sleep hygiene tips.     Education: Stress and Anxiety: - Provides group verbal and written instruction about the health risks of elevated stress and causes of high stress.  Discuss the correlation between heart/lung disease and anxiety and treatment options. Review healthy ways to manage with stress and anxiety.    Initial Review & Psychosocial Screening:   Quality of Life Scores:  Quality of Life - 11/04/19 1621      Quality of Life   Select  Quality of Life      Quality of Life Scores   Health/Function Pre  11.6 %    Socioeconomic Pre  25.5 %    Psych/Spiritual Pre  26.57 %    Family Pre  27.6 %    GLOBAL Pre  20.06 %      Scores of 19 and below usually indicate a poorer quality of life in these areas.  A difference of  2-3 points is a clinically meaningful  difference.  A difference of 2-3 points in the total score of the Quality of Life Index has been associated with significant improvement in overall quality of life, self-image, physical symptoms, and general health in studies assessing change in quality of life.  PHQ-9: Recent Review Flowsheet Data    Depression screen [facility] 2/9 11/24/2019 05/26/2018 04/26/2014   Decreased Interest 0 0 0   Down, Depressed, Hopeless 0 0 0   PHQ - 2 Score 0 0 0     Interpretation of Total Score  Total Score Depression Severity:  1-4 = Minimal depression, 5-9 = Mild depression, 10-14 = Moderate depression, 15-19 = Moderately severe depression, 20-27 = Severe depression   Psychosocial Evaluation and Intervention:   Psychosocial Re-Evaluation: Psychosocial Re-Evaluation    Row Name 12/10/19 1044 12/29/19 1035 02/23/20 1029         Psychosocial Re-Evaluation   Current issues with  Current Stress Concerns  Current Stress Concerns  Current Stress Concerns     Comments  Ron is doing well in rehab.  His hips are his biggest limitations for exercise and mobility.  His health is his biggest stressor.  He gets at least 6 hours of sleep a night.  His hips are his biggest limitations for exercise and mobility.  His health is his biggest stressor, but his health is not a "mental stressor". Pt reports wife is a good support system and that she will cook for him.  He gets at least 6 hours of sleep a night, body pain sometimes makes it hard to sleep; will get on average 8-9 hours of sleep with 2-3 interuptions from pain or bathroom.  Pt reports stress levels are fine and he is doing well with his job and at home. Pt reports hvaing a good support system. Pt reports still sleeping well. Pt reports having pain all the time; neuropathy and herniated disc and pinched nerve and shoulder pain. Pt reports doctor wants him to get surgery, but with evewrthing else has not done it.     Expected Outcomes  Short: Continue to  attend to build hip  strength.  Long: COntinue to cope positively  Short: Continue to attend to build hip strength.  Long: Continue to cope positively  Short: Continue to attend to build hip strength.  Long: Continue to cope positively     Interventions  Encouraged to attend Cardiac Rehabilitation for the exercise  Encouraged to attend Cardiac Rehabilitation for the exercise  Encouraged to attend Cardiac Rehabilitation for the exercise     Continue Psychosocial Services   Follow up required by staff  Follow up required by staff  Follow up required by staff        Psychosocial Discharge (Final Psychosocial Re-Evaluation): Psychosocial Re-Evaluation - 02/23/20 1029      Psychosocial Re-Evaluation   Current issues with  Current Stress Concerns    Comments  Pt reports stress levels are fine and he is doing well with his job and at home. Pt reports hvaing a good support system. Pt reports still sleeping well. Pt reports having pain all the time; neuropathy and herniated disc and pinched nerve and shoulder pain. Pt reports doctor wants him to get surgery, but with evewrthing else has not done it.    Expected Outcomes  Short: Continue to attend to build hip strength.  Long: Continue to cope positively    Interventions  Encouraged to attend Cardiac Rehabilitation for the exercise    Continue Psychosocial Services   Follow up required by staff       Vocational Rehabilitation: Provide vocational rehab assistance to qualifying candidates.   Vocational Rehab Evaluation & Intervention:   Education: Education Goals: Education classes will be provided on a variety of topics geared toward better understanding of heart health and risk factor modification. Participant will state understanding/return demonstration of topics presented as noted by education test scores.  Learning Barriers/Preferences: Learning Barriers/Preferences - 11/02/19 1607      Learning Barriers/Preferences   Learning Barriers  None    Learning  Preferences  Individual Instruction       General Cardiac Education Topics:  AED/CPR: - Group verbal and written instruction with the use of models to demonstrate the basic use of the AED with the basic ABC's of resuscitation.   Anatomy & Physiology of the Heart: - Group verbal and written instruction and models provide basic cardiac anatomy and physiology, with the coronary electrical and arterial systems. Review of Valvular disease and Heart Failure   Cardiac Procedures: - Group verbal and written instruction to review commonly prescribed medications for heart disease. Reviews the medication, class of the drug, and side effects. Includes the steps to properly store meds and maintain the prescription regimen. (beta blockers and nitrates)   Cardiac Medications I: - Group verbal and written instruction to review commonly prescribed medications for heart disease. Reviews the medication, class of the drug, and side effects. Includes the steps to properly store meds and maintain the prescription regimen.   Cardiac Medications II: -Group verbal and written instruction to review commonly prescribed medications for heart disease. Reviews the medication, class of the drug, and side effects. (all other drug classes)    Go Sex-Intimacy & Heart Disease, Get SMART - Goal Setting: - Group verbal and written instruction through game format to discuss heart disease and the return to sexual intimacy. Provides group verbal and written material to discuss and apply goal setting through the application of the S.M.A.R.T. Method.   Other Matters of the Heart: - Provides group verbal, written materials and models to describe Stable Angina and Peripheral  Artery. Includes description of the disease process and treatment options available to the cardiac patient.   Infection Prevention: - Provides verbal and written material to individual with discussion of infection control including proper hand washing  and proper equipment cleaning during exercise session.   Falls Prevention: - Provides verbal and written material to individual with discussion of falls prevention and safety.   Other: -Provides group and verbal instruction on various topics (see comments)   Knowledge Questionnaire Score: Knowledge Questionnaire Score - 11/04/19 1623      Knowledge Questionnaire Score   Pre Score  24/26       Core Components/Risk Factors/Patient Goals at Admission: Personal Goals and Risk Factors at Admission - 11/04/19 1623      Core Components/Risk Factors/Patient Goals on Admission   Diabetes  Yes    Intervention  Provide education about signs/symptoms and action to take for hypo/hyperglycemia.;Provide education about proper nutrition, including hydration, and aerobic/resistive exercise prescription along with prescribed medications to achieve blood glucose in normal ranges: Fasting glucose 65-99 mg/dL    Expected Outcomes  Short Term: Participant verbalizes understanding of the signs/symptoms and immediate care of hyper/hypoglycemia, proper foot care and importance of medication, aerobic/resistive exercise and nutrition plan for blood glucose control.;Long Term: Attainment of HbA1C < 7%.    Heart Failure  Yes    Intervention  Provide a combined exercise and nutrition program that is supplemented with education, support and counseling about heart failure. Directed toward relieving symptoms such as shortness of breath, decreased exercise tolerance, and extremity edema.    Expected Outcomes  Improve functional capacity of life;Short term: Attendance in program 2-3 days a week with increased exercise capacity. Reported lower sodium intake. Reported increased fruit and vegetable intake. Reports medication compliance.;Short term: Daily weights obtained and reported for increase. Utilizing diuretic protocols set by physician.;Long term: Adoption of self-care skills and reduction of barriers for early signs  and symptoms recognition and intervention leading to self-care maintenance.    Hypertension  Yes    Intervention  Provide education on lifestyle modifcations including regular physical activity/exercise, weight management, moderate sodium restriction and increased consumption of fresh fruit, vegetables, and low fat dairy, alcohol moderation, and smoking cessation.;Monitor prescription use compliance.    Expected Outcomes  Short Term: Continued assessment and intervention until BP is < 140/23m HG in hypertensive participants. < 130/850mHG in hypertensive participants with diabetes, heart failure or chronic kidney disease.;Long Term: Maintenance of blood pressure at goal levels.    Lipids  Yes    Intervention  Provide education and support for participant on nutrition & aerobic/resistive exercise along with prescribed medications to achieve LDL <7072mHDL >84m80m  Expected Outcomes  Short Term: Participant states understanding of desired cholesterol values and is compliant with medications prescribed. Participant is following exercise prescription and nutrition guidelines.;Long Term: Cholesterol controlled with medications as prescribed, with individualized exercise RX and with personalized nutrition plan. Value goals: LDL < 70mg93mL > 40 mg.    Stress  Yes    Intervention  Offer individual and/or small group education and counseling on adjustment to heart disease, stress management and health-related lifestyle change. Teach and support self-help strategies.    Personal Goal Other  Yes    Personal Goal  STG- Participant wants to get back to feeling like he's in his 20's 49'sbreathing freely. LTG- Participant wants to be able to function better all around.    Intervention  Begin Cardiac Rehab program, learn about and implement heart  healthy living,develop habit of exercising and taking care of self.    Expected Outcomes  Accomplishing the set at intake and setting new goals. Finding new normal and how  to live comfortably with new diagnosis.       Education:Diabetes - Individual verbal and written instruction to review signs/symptoms of diabetes, desired ranges of glucose level fasting, after meals and with exercise. Acknowledge that pre and post exercise glucose checks will be done for 3 sessions at entry of program.   Education: Know Your Numbers and Risk Factors: -Group verbal and written instruction about important numbers in your health.  Discussion of what are risk factors and how they play a role in the disease process.  Review of Cholesterol, Blood Pressure, Diabetes, and BMI and the role they play in your overall health.   Core Components/Risk Factors/Patient Goals Review:  Goals and Risk Factor Review    Row Name 12/10/19 1046 12/29/19 1039 02/23/20 1026         Core Components/Risk Factors/Patient Goals Review   Personal Goals Review  Weight Management/Obesity;Hypertension;Heart Failure;Diabetes  Weight Management/Obesity;Hypertension;Heart Failure;Diabetes  Weight Management/Obesity;Hypertension;Heart Failure;Diabetes     Review  Ron is doing well.  His weight has been steady.  However he is starting to notice changes in his body composition.  His clothes are starting to fit differently.  Blood sugars have been up and down but are starting to regulate more.  He is also starting to watch his soduim levels.  He is just having some chest pressure but no other heart failure symptoms.  Pt reports that his weight is stable but his clothes are now much looser and may need new clothes soon. He reports his BG is eratic; 300 yesterday and last night is was 99, bottomed out overnight and drank some juice, this morning it was 94. He reports eating consistently. On insulin, he says his MD is going to change meds to help with this. He is still feeling chest pressure and light headed if he gets up too quickly, but no other HF symptoms.  Pt has reported increased weight, does not feel like it is  muscle mass. Pt reports still on insulin, blood sugar varyies 300 -->70 without doing anything differently. not feeling lightheaded when stands anymore.     Expected Outcomes  Short: Continue to work on weight.  Long: Continue to monitor risk factors.  Short: Continue to work on strength Long: Continue to monitor risk factors.  Short: Continue to work on strength Long: Continue to monitor risk factors.        Core Components/Risk Factors/Patient Goals at Discharge (Final Review):  Goals and Risk Factor Review - 02/23/20 1026      Core Components/Risk Factors/Patient Goals Review   Personal Goals Review  Weight Management/Obesity;Hypertension;Heart Failure;Diabetes    Review  Pt has reported increased weight, does not feel like it is muscle mass. Pt reports still on insulin, blood sugar varyies 300 -->70 without doing anything differently. not feeling lightheaded when stands anymore.    Expected Outcomes  Short: Continue to work on strength Long: Continue to monitor risk factors.       ITP Comments: ITP Comments    Row Name 11/02/19 1553 11/04/19 1603 11/09/19 0925 11/18/19 0851 12/16/19 1228   ITP Comments  Virtual Eval/Intake Call/ EP Orientation Wed 11/04/19 at 1:30pm Diagnosis Documentation can be found in [facility] 10/26.  6 minute walk test completed today. Patient did have some chest tightness that he reports as a regular occurence  that his Cardiologist is aware of and attributes to his low EF. Participant felt better after rest and moving on to balance test and resistance training.  First full day of exercise!  Patient was oriented to gym and equipment including functions, settings, policies, and procedures.  Patient's individual exercise prescription and treatment plan were reviewed.  All starting workloads were established based on the results of the 6 minute walk test done at initial orientation visit.  The plan for exercise progression was also introduced and progression will be customized  based on patient's performance and goals.  30 day review competed . ITP sent to Dr Emily Filbert for review, changes as needed and ITP approval signature  New to program  30 day review completed. ITP sent to Dr. Emily Filbert, Medical Director of Cardiac and Pulmonary Rehab. Continue with ITP unless changes are made by physician.  Department operating under reduced schedule until further notice by request from hospital leadership.   [place] Name 01/13/20 0629 01/19/20 1022 01/19/20 1117 02/10/20 0633 02/16/20 1524   ITP Comments  30 day chart review completed. ITP sent to Dr Zachery Dakins Medical Director, for review,changes as needed and signature.  Kaedon is experiencing left shoulder discomfort.  Thinks he pulled a muscle. Movement of shoulder creates discomfort.  Advised to call MD if this continues or gets worse. Also advised not to use left arm to hard today. He verbalized understanding.  Elmon stated he did fine today, no further discomfort while exercising.  30 day chart review completed. ITP sent to Dr Zachery Dakins Medical Director, for review,changes as needed and signature. Continue with ITP if no changes requested  Ron's attendance has been spotted, only attended 3 times this month for various reasons.   [place] Name 03/09/20 0608           ITP Comments  30 Day review completed. Medical Director review done, changes made as directed,and approval shown by signature of Market researcher.          Comments:

## 2020-03-09 NOTE — Progress Notes (Signed)
Would you call [name] and schedule him for a one month follow up for a SHIM?  It can be a virtual visit.   Patient notified and appointment has been made.

## 2020-03-10 DIAGNOSIS — E1142 Type 2 diabetes mellitus with diabetic polyneuropathy: Secondary | ICD-10-CM | POA: Diagnosis not present

## 2020-03-10 DIAGNOSIS — E785 Hyperlipidemia, unspecified: Secondary | ICD-10-CM | POA: Diagnosis not present

## 2020-03-10 DIAGNOSIS — E1159 Type 2 diabetes mellitus with other circulatory complications: Secondary | ICD-10-CM | POA: Diagnosis not present

## 2020-03-10 DIAGNOSIS — E1165 Type 2 diabetes mellitus with hyperglycemia: Secondary | ICD-10-CM | POA: Diagnosis not present

## 2020-03-10 DIAGNOSIS — Z794 Long term (current) use of insulin: Secondary | ICD-10-CM | POA: Diagnosis not present

## 2020-03-10 DIAGNOSIS — E1169 Type 2 diabetes mellitus with other specified complication: Secondary | ICD-10-CM | POA: Diagnosis not present

## 2020-03-10 DIAGNOSIS — I10 Essential (primary) hypertension: Secondary | ICD-10-CM | POA: Diagnosis not present

## 2020-03-14 ENCOUNTER — Ambulatory Visit: Payer: Medicare Other | Admitting: Cardiology

## 2020-03-15 ENCOUNTER — Other Ambulatory Visit: Payer: Self-pay

## 2020-03-15 ENCOUNTER — Encounter: Payer: Medicare Other | Admitting: *Deleted

## 2020-03-15 DIAGNOSIS — I110 Hypertensive heart disease with heart failure: Secondary | ICD-10-CM | POA: Diagnosis not present

## 2020-03-15 DIAGNOSIS — I5022 Chronic systolic (congestive) heart failure: Secondary | ICD-10-CM | POA: Diagnosis not present

## 2020-03-15 DIAGNOSIS — E119 Type 2 diabetes mellitus without complications: Secondary | ICD-10-CM | POA: Diagnosis not present

## 2020-03-15 DIAGNOSIS — Z79899 Other long term (current) drug therapy: Secondary | ICD-10-CM | POA: Diagnosis not present

## 2020-03-15 NOTE — Progress Notes (Signed)
Daily Session Note  Patient Details  Name: [name] MRN: [id number] Date of Birth: [birth year] Referring Provider:     Cardiac Rehab from 11/04/2019 in [facility] Cardiac and Pulmonary Rehab  Referring Provider  [name]      Encounter Date: 03/15/2020  Check In: Session Check In - 03/15/20 1140      Check-In   Supervising physician immediately available to respond to emergencies  See telemetry face sheet for immediately available ER MD    Location  [facility]-Cardiac & Pulmonary Rehab    Staff Present  Nada Maclachlan, BA, ACSM CEP, Exercise Physiologist;Joseph Flavia Shipper;Heath Lark, RN, BSN, CCRP    Virtual Visit  No    Medication changes reported      No    Fall or balance concerns reported     No    Warm-up and Cool-down  Performed on first and last piece of equipment    Resistance Training Performed  Yes    PAD/SET Patient?  No      Pain Assessment   Currently in Pain?  No/denies          Social History   Tobacco Use  Smoking Status Never Smoker  Smokeless Tobacco Never Used    Goals Met:  Independence with exercise equipment Exercise tolerated well No report of cardiac concerns or symptoms  Goals Unmet:  Not Applicable  Comments: Pt able to follow exercise prescription today without complaint.  Will continue to monitor for progression.    Dr. Emily Filbert is Medical Director for [name].

## 2020-03-17 ENCOUNTER — Telehealth: Payer: Self-pay | Admitting: *Deleted

## 2020-03-17 ENCOUNTER — Encounter: Payer: Self-pay | Admitting: *Deleted

## 2020-03-17 DIAGNOSIS — I5022 Chronic systolic (congestive) heart failure: Secondary | ICD-10-CM

## 2020-03-17 NOTE — Telephone Encounter (Signed)
Called to check on [name].  Left message about lack of attendance and request for improved attendance to maintain spot or look into possible virtual program.

## 2020-03-21 ENCOUNTER — Other Ambulatory Visit: Payer: Self-pay

## 2020-03-21 MED ORDER — POTASSIUM CHLORIDE CRYS ER 20 MEQ PO TBCR: 20.0000 meq | EXTENDED_RELEASE_TABLET | Freq: Two times a day (BID) | ORAL | 2 refills | Status: DC

## 2020-03-22 ENCOUNTER — Encounter: Payer: Medicare Other | Attending: Internal Medicine | Admitting: *Deleted

## 2020-03-22 ENCOUNTER — Other Ambulatory Visit: Payer: Self-pay

## 2020-03-22 DIAGNOSIS — I5022 Chronic systolic (congestive) heart failure: Secondary | ICD-10-CM

## 2020-03-22 DIAGNOSIS — E119 Type 2 diabetes mellitus without complications: Secondary | ICD-10-CM | POA: Insufficient documentation

## 2020-03-22 DIAGNOSIS — I110 Hypertensive heart disease with heart failure: Secondary | ICD-10-CM | POA: Insufficient documentation

## 2020-03-22 DIAGNOSIS — Z79899 Other long term (current) drug therapy: Secondary | ICD-10-CM | POA: Insufficient documentation

## 2020-03-22 NOTE — Progress Notes (Signed)
Incomplete Session Note  Patient Details  Name: [name] MRN: [id number] Date of Birth: 01/06/1976 Referring Provider:     Cardiac Rehab from 11/04/2019 in [facility] Cardiac and Pulmonary Rehab  Referring Provider  Cristal Deer End      Demon Volante [facility] did not complete his rehab session.   Arrived with BP 84/58 asymptomatic.  Drank 2 bottles  of water  over 30 minutesand y BP up to 96/58. Not enough time to complete a session so discharged to home.

## 2020-03-23 ENCOUNTER — Ambulatory Visit: Payer: Medicare Other

## 2020-03-23 ENCOUNTER — Telehealth: Payer: Self-pay

## 2020-03-23 NOTE — Telephone Encounter (Signed)
Called patient 3 times trying to complete his medicare visit. Patient never answered. Left message notifying patient appointment was cancelled and to call office back if he wishes to reschedule.

## 2020-03-24 ENCOUNTER — Ambulatory Visit (INDEPENDENT_AMBULATORY_CARE_PROVIDER_SITE_OTHER): Payer: Medicare Other

## 2020-03-24 ENCOUNTER — Encounter: Payer: Medicare Other | Admitting: *Deleted

## 2020-03-24 ENCOUNTER — Other Ambulatory Visit: Payer: Self-pay

## 2020-03-24 VITALS — BP 112/74 | Wt 225.0 lb

## 2020-03-24 DIAGNOSIS — Z0000 Encounter for general adult medical examination without abnormal findings: Secondary | ICD-10-CM | POA: Diagnosis not present

## 2020-03-24 DIAGNOSIS — Z79899 Other long term (current) drug therapy: Secondary | ICD-10-CM | POA: Diagnosis not present

## 2020-03-24 DIAGNOSIS — I5022 Chronic systolic (congestive) heart failure: Secondary | ICD-10-CM | POA: Diagnosis not present

## 2020-03-24 DIAGNOSIS — E119 Type 2 diabetes mellitus without complications: Secondary | ICD-10-CM | POA: Diagnosis not present

## 2020-03-24 DIAGNOSIS — I110 Hypertensive heart disease with heart failure: Secondary | ICD-10-CM | POA: Diagnosis not present

## 2020-03-24 NOTE — Progress Notes (Signed)
PCP notes:  Health Maintenance: Foot exam- due   Abnormal Screenings: none   Patient concerns: none   Nurse concerns: none   Next PCP appt.: none 

## 2020-03-24 NOTE — Progress Notes (Signed)
Subjective:   [name] is a 44 y.o. male who presents for an Initial Medicare Annual Wellness Visit.  Review of Systems: N/A    This visit is being conducted through telemedicine via telephone at the nurse health advisor's home address due to the COVID-19 pandemic. This patient has given me verbal consent via doximity to conduct this visit, patient states they are participating from their home address. Patient and myself are on the telephone call. There is no referral for this visit. Some vital signs may be absent or patient reported.    Patient identification: identified by name, DOB, and current address   Cardiac Risk Factors include: hypertension;diabetes mellitus;male gender    Objective:    Today's Vitals   03/24/20 1619  BP: 112/74  Weight: 225 lb (102.1 kg)   Body mass index is 29.69 kg/m.  Advanced Directives 03/24/2020 11/24/2019 11/02/2019 09/14/2019 09/14/2019 09/07/2019 03/29/2019  Does Patient Have a Medical Advance Directive? No No No No No No No  Would patient like information on creating a medical advance directive? No - Patient declined No - Patient declined Yes (MAU/Ambulatory/Procedural Areas - Information given) No - Patient declined No - Patient declined - No - Patient declined    Current Medications (verified) Outpatient Encounter Medications as of 03/24/2020  Medication Sig  . atorvastatin (LIPITOR) 40 MG tablet Take 1 tablet (40 mg total) by mouth daily at 6 PM.  . Blood Pressure Monitoring (BLOOD PRESSURE MONITOR/L CUFF) MISC 1 Units by Does not apply route as needed.  . carvedilol (COREG) 6.25 MG tablet Take 1 tablet (6.25 mg total) by mouth 2 (two) times daily.  . DULoxetine (CYMBALTA) 20 MG capsule Take 2 capsules by mouth daily.  Marland Kitchen NOVOLIN 70/30 FLEXPEN (70-30) 100 UNIT/ML PEN Inject into the skin 2 (two) times daily. Sliding scale  . potassium chloride SA (KLOR-CON) 20 MEQ tablet Take 1 tablet (20 mEq total) by mouth 2 (two) times daily.  Marland Kitchen  rOPINIRole (REQUIP) 0.25 MG tablet Take 1 tablet by mouth 3 (three) times daily.  . sacubitril-valsartan (ENTRESTO) 24-26 MG Take 1 tablet by mouth 2 (two) times daily.  . sildenafil (VIAGRA) 100 MG tablet Take 1 tablet (100 mg total) by mouth daily as needed for erectile dysfunction. Take two hours prior to intercourse on an empty stomach  . torsemide (DEMADEX) 20 MG tablet Take 1 tablet (20 mg total) by mouth daily.   No facility-administered encounter medications on file as of 03/24/2020.    Allergies (verified) Food   History: Past Medical History:  Diagnosis Date  . CHF (congestive heart failure) (HCC)   . CKD (chronic kidney disease), stage III   . Diabetes mellitus   . ED (erectile dysfunction)   . Hypertension    Past Surgical History:  Procedure Laterality Date  . ANKLE SURGERY     both   . APPENDECTOMY    . back fusion     l4,l5  . BACK SURGERY    . LAPAROSCOPIC APPENDECTOMY N/A 03/28/2019   Procedure: APPENDECTOMY LAPAROSCOPIC;  Surgeon: Sung Amabile, DO;  Location: [facility] ORS;  Service: General;  Laterality: N/A;  . NASAL SINUS SURGERY     Family History  Problem Relation Age of Onset  . Hypertension Mother   . Diabetes Father    Social History   Socioeconomic History  . Marital status: Married    Spouse name: Not on file  . Number of children: Not on file  . Years of education: Not on  file  . Highest education level: Not on file  Occupational History  . Occupation: unemployed  Tobacco Use  . Smoking status: Never Smoker  . Smokeless tobacco: Never Used  Substance and Sexual Activity  . Alcohol use: No  . Drug use: No  . Sexual activity: Yes    Birth control/protection: None  Other Topics Concern  . Not on file  Social History Narrative  . Not on file   Social Determinants of Health   Financial Resource Strain: Low Risk   . Difficulty of Paying Living Expenses: Not hard at all  Food Insecurity: No Food Insecurity  . Worried About Sales executive in the Last Year: Never true  . Ran Out of Food in the Last Year: Never true  Transportation Needs: No Transportation Needs  . Lack of Transportation (Medical): No  . Lack of Transportation (Non-Medical): No  Physical Activity: Sufficiently Active  . Days of Exercise per Week: 3 days  . Minutes of Exercise per Session: 50 min  Stress: No Stress Concern Present  . Feeling of Stress : Not at all  Social Connections: Slightly Isolated  . Frequency of Communication with Friends and Family: More than three times a week  . Frequency of Social Gatherings with Friends and Family: More than three times a week  . Attends Religious Services: 1 to 4 times per year  . Active Member of Clubs or Organizations: No  . Attends Archivist Meetings: Never  . Marital Status: Married   Tobacco Counseling Counseling given: Not Answered   Clinical Intake:  Pre-visit preparation completed: Yes  Pain : No/denies pain     Nutritional Status: BMI 25 -29 Overweight Nutritional Risks: None Diabetes: Yes CBG done?: No Did pt. bring in CBG monitor from home?: No  How often do you need to have someone help you when you read instructions, pamphlets, or other written materials from your doctor or pharmacy?: 1 - Never What is the last grade level you completed in school?: 2 years of college  Interpreter Needed?: No  Information entered by :: [place], LPN  Activities of Daily Living In your present state of health, do you have any difficulty performing the following activities: 03/24/2020 09/24/2019  Hearing? N N  Vision? Y Y  Comment blurry vision -  Difficulty concentrating or making decisions? N Y  Walking or climbing stairs? N N  Dressing or bathing? N N  Doing errands, shopping? N N  Preparing Food and eating ? N -  Using the Toilet? N -  In the past six months, have you accidently leaked urine? N -  Do you have problems with loss of bowel control? N -  Managing your Medications?  N -  Managing your Finances? N -  Housekeeping or managing your Housekeeping? N -  Some recent data might be hidden     Immunizations and Health Maintenance Immunization History  Administered Date(s) Administered  . Pneumococcal Polysaccharide-23 06/23/2018  . Tdap 12/08/2013   Health Maintenance Due  Topic Date Due  . FOOT EXAM  Never done  . COVID-19 Vaccine (1) Never done  . URINE MICROALBUMIN  05/27/2019  . HEMOGLOBIN A1C  03/15/2020    Patient Care Team: Elby Beck, FNP as PCP - General (Nurse Practitioner) Kate Sable, MD as PCP - Cardiology (Cardiology)  Indicate any recent Medical Services you may have received from other than [facility] providers in the past year (date may be approximate).    Assessment:  This is a routine wellness examination for Antoni.  Hearing/Vision screen  Hearing Screening   125Hz  250Hz  500Hz  1000Hz  2000Hz  3000Hz  4000Hz  6000Hz  8000Hz   Right ear:           Left ear:           Vision Screening Comments: Patient gets annual eye exams   Dietary issues and exercise activities discussed: Current Exercise Habits: The patient does not participate in regular exercise at present, Exercise limited by: None identified  Goals    . Patient Stated     03/24/2020, I will continue to do cardiac rehab weekly.       Depression Screen PHQ 2/9 Scores 03/24/2020 11/24/2019 05/26/2018 04/26/2014  PHQ - 2 Score 0 0 0 0  PHQ- 9 Score 0 - - -    Fall Risk Fall Risk  03/24/2020 12/08/2019 11/24/2019 09/24/2019 05/26/2018  Falls in the past year? 0 0 0 0 No  Number falls in past yr: 0 - - 0 -  Injury with Fall? 0 - - 0 -  Risk for fall due to : No Fall Risks - - - -  Follow up Falls evaluation completed;Falls prevention discussed - - - -    Is the patient's home free of loose throw rugs in walkways, pet beds, electrical cords, etc?   yes      Grab bars in the bathroom? no      Handrails on the stairs?   no      Adequate lighting?   yes  Timed Get Up and Go  performed: N/A  Cognitive Function: MMSE - Mini Mental State Exam 03/24/2020  Orientation to time 5  Orientation to Place 5  Registration 3  Attention/ Calculation 5  Recall 3  Language- repeat 1       Mini Cog  Mini-Cog screen was completed. Maximum score is 22. A value of 0 denotes this part of the MMSE was not completed or the patient failed this part of the Mini-Cog screening.  Screening Tests Health Maintenance  Topic Date Due  . FOOT EXAM  Never done  . COVID-19 Vaccine (1) Never done  . URINE MICROALBUMIN  05/27/2019  . HEMOGLOBIN A1C  03/15/2020  . INFLUENZA VACCINE  06/19/2020  . OPHTHALMOLOGY EXAM  07/20/2020  . TETANUS/TDAP  12/09/2023  . PNEUMOCOCCAL POLYSACCHARIDE VACCINE AGE 34-64 HIGH RISK  Completed  . HIV Screening  Completed    Qualifies for Shingles Vaccine: N/A, at age 18   Cancer Screenings: Lung: Low Dose CT Chest recommended if Age 55-80 years, 30 pack-year currently smoking OR have quit w/in 15 years. Patient does not qualify. Colorectal: at age 82   Additional Screenings:  Hepatitis C Screening: N/A      Plan:    Patient will continue to do cardiac rehab weekly.   I have personally reviewed and noted the following in the patient's chart:   . Medical and social history . Use of alcohol, tobacco or illicit drugs  . Current medications and supplements . Functional ability and status . Nutritional status . Physical activity . Advanced directives . List of other physicians . Hospitalizations, surgeries, and ER visits in previous 12 months . Vitals . Screenings to include cognitive, depression, and falls . Referrals and appointments  In addition, I have reviewed and discussed with patient certain preventive protocols, quality metrics, and best practice recommendations. A written personalized care plan for preventive services as well as general preventive health recommendations were provided to patient.  Janalyn Shy,  LPN   01/18/1215

## 2020-03-24 NOTE — Progress Notes (Signed)
Daily Session Note  Patient Details  Name: [name] MRN: [id number] Date of Birth: Jun 26, 1976 Referring Provider:     Cardiac Rehab from 11/04/2019 in [facility]  Referring Provider  [name]      Encounter Date: 03/24/2020  Check In: Session Check In - 03/24/20 1118      Check-In   Supervising physician immediately available to respond to emergencies  See telemetry face sheet for immediately available ER MD    Location  [facility]-Cardiac & Pulmonary Rehab    Staff Present  Renita Papa, RN BSN;Joseph Hood RCP,RRT,BSRT;Melissa Columbus RDN, Rowe Pavy, BA, ACSM CEP, Exercise Physiologist    Virtual Visit  No    Medication changes reported      No    Fall or balance concerns reported     No    Warm-up and Cool-down  Performed on first and last piece of equipment    Resistance Training Performed  Yes    VAD Patient?  No    PAD/SET Patient?  No      Pain Assessment   Currently in Pain?  No/denies          Social History   Tobacco Use  Smoking Status Never Smoker  Smokeless Tobacco Never Used    Goals Met:  Independence with exercise equipment Exercise tolerated well No report of cardiac concerns or symptoms Strength training completed today  Goals Unmet:  Not Applicable  Comments: Pt able to follow exercise prescription today without complaint.  Will continue to monitor for progression.    Dr. Emily Filbert is Medical Director for [place] and [facility].

## 2020-03-24 NOTE — Patient Instructions (Signed)
[name] , Thank you for taking time to come for your Medicare Wellness Visit. I appreciate your ongoing commitment to your health goals. Please review the following plan we discussed and let me know if I can assist you in the future.   Screening recommendations/referrals: Colonoscopy: at age 45 Recommended yearly ophthalmology/optometry visit for glaucoma screening and checkup Recommended yearly dental visit for hygiene and checkup  Vaccinations: Influenza vaccine: Fall 2021 Pneumococcal vaccine: age 45  Tdap vaccine: Up to date, completed 12/08/2013 Shingles vaccine: age 45     Advanced directives: Advance directive discussed with you today. Even though you declined this today please call our office should you change your mind and we can give you the proper paperwork for you to fill out.  Conditions/risks identified: diabetes, hypertension  Next appointment: none  Preventive Care 40-64 Years, Male Preventive care refers to lifestyle choices and visits with your health care provider that can promote health and wellness. What does preventive care include?  A yearly physical exam. This is also called an annual well check.  Dental exams once or twice a year.  Routine eye exams. Ask your health care provider how often you should have your eyes checked.  Personal lifestyle choices, including:  Daily care of your teeth and gums.  Regular physical activity.  Eating a healthy diet.  Avoiding tobacco and drug use.  Limiting alcohol use.  Practicing safe sex.  Taking low-dose aspirin every day starting at age 78. What happens during an annual well check? The services and screenings done by your health care provider during your annual well check will depend on your age, overall health, lifestyle risk factors, and family history of disease. Counseling  Your health care provider may ask you questions about your:  Alcohol use.  Tobacco use.  Drug use.  Emotional  well-being.  Home and relationship well-being.  Sexual activity.  Eating habits.  Work and work Statistician. Screening  You may have the following tests or measurements:  Height, weight, and BMI.  Blood pressure.  Lipid and cholesterol levels. These may be checked every 5 years, or more frequently if you are over 32 years old.  Skin check.  Lung cancer screening. You may have this screening every year starting at age 39 if you have a 30-pack-year history of smoking and currently smoke or have quit within the past 15 years.  Fecal occult blood test (FOBT) of the stool. You may have this test every year starting at age 15.  Flexible sigmoidoscopy or colonoscopy. You may have a sigmoidoscopy every 5 years or a colonoscopy every 10 years starting at age 47.  Prostate cancer screening. Recommendations will vary depending on your family history and other risks.  Hepatitis C blood test.  Hepatitis B blood test.  Sexually transmitted disease (STD) testing.  Diabetes screening. This is done by checking your blood sugar (glucose) after you have not eaten for a while (fasting). You may have this done every 1-3 years. Discuss your test results, treatment options, and if necessary, the need for more tests with your health care provider. Vaccines  Your health care provider may recommend certain vaccines, such as:  Influenza vaccine. This is recommended every year.  Tetanus, diphtheria, and acellular pertussis (Tdap, Td) vaccine. You may need a Td booster every 10 years.  Zoster vaccine. You may need this after age 29.  Pneumococcal 13-valent conjugate (PCV13) vaccine. You may need this if you have certain conditions and have not been vaccinated.  Pneumococcal  polysaccharide (PPSV23) vaccine. You may need one or two doses if you smoke cigarettes or if you have certain conditions. Talk to your health care provider about which screenings and vaccines you need and how often you need  them. This information is not intended to replace advice given to you by your health care provider. Make sure you discuss any questions you have with your health care provider. Document Released: 12/02/2015 Document Revised: 07/25/2016 Document Reviewed: 09/06/2015 Elsevier Interactive Patient Education  2017 [place] Prevention in the Home Falls can cause injuries. They can happen to people of all ages. There are many things you can do to make your home safe and to help prevent falls. What can I do on the outside of my home?  Regularly fix the edges of walkways and driveways and fix any cracks.  Remove anything that might make you trip as you walk through a door, such as a raised step or threshold.  Trim any bushes or trees on the path to your home.  Use bright outdoor lighting.  Clear any walking paths of anything that might make someone trip, such as rocks or tools.  Regularly check to see if handrails are loose or broken. Make sure that both sides of any steps have handrails.  Any raised decks and porches should have guardrails on the edges.  Have any leaves, snow, or ice cleared regularly.  Use sand or salt on walking paths during winter.  Clean up any spills in your garage right away. This includes oil or grease spills. What can I do in the bathroom?  Use night lights.  Install grab bars by the toilet and in the tub and shower. Do not use towel bars as grab bars.  Use non-skid mats or decals in the tub or shower.  If you need to sit down in the shower, use a plastic, non-slip stool.  Keep the floor dry. Clean up any water that spills on the floor as soon as it happens.  Remove soap buildup in the tub or shower regularly.  Attach bath mats securely with double-sided non-slip rug tape.  Do not have throw rugs and other things on the floor that can make you trip. What can I do in the bedroom?  Use night lights.  Make sure that you have a light by your  bed that is easy to reach.  Do not use any sheets or blankets that are too big for your bed. They should not hang down onto the floor.  Have a firm chair that has side arms. You can use this for support while you get dressed.  Do not have throw rugs and other things on the floor that can make you trip. What can I do in the kitchen?  Clean up any spills right away.  Avoid walking on wet floors.  Keep items that you use a lot in easy-to-reach places.  If you need to reach something above you, use a strong step stool that has a grab bar.  Keep electrical cords out of the way.  Do not use floor polish or wax that makes floors slippery. If you must use wax, use non-skid floor wax.  Do not have throw rugs and other things on the floor that can make you trip. What can I do with my stairs?  Do not leave any items on the stairs.  Make sure that there are handrails on both sides of the stairs and use them. Fix handrails that are  broken or loose. Make sure that handrails are as long as the stairways.  Check any carpeting to make sure that it is firmly attached to the stairs. Fix any carpet that is loose or worn.  Avoid having throw rugs at the top or bottom of the stairs. If you do have throw rugs, attach them to the floor with carpet tape.  Make sure that you have a light switch at the top of the stairs and the bottom of the stairs. If you do not have them, ask someone to add them for you. What else can I do to help prevent falls?  Wear shoes that:  Do not have high heels.  Have rubber bottoms.  Are comfortable and fit you well.  Are closed at the toe. Do not wear sandals.  If you use a stepladder:  Make sure that it is fully opened. Do not climb a closed stepladder.  Make sure that both sides of the stepladder are locked into place.  Ask someone to hold it for you, if possible.  Clearly mark and make sure that you can see:  Any grab bars or handrails.  First and last  steps.  Where the edge of each step is.  Use tools that help you move around (mobility aids) if they are needed. These include:  Canes.  Walkers.  Scooters.  Crutches.  Turn on the lights when you go into a dark area. Replace any light bulbs as soon as they burn out.  Set up your furniture so you have a clear path. Avoid moving your furniture around.  If any of your floors are uneven, fix them.  If there are any pets around you, be aware of where they are.  Review your medicines with your doctor. Some medicines can make you feel dizzy. This can increase your chance of falling. Ask your doctor what other things that you can do to help prevent falls. This information is not intended to replace advice given to you by your health care provider. Make sure you discuss any questions you have with your health care provider. Document Released: 09/01/2009 Document Revised: 04/12/2016 Document Reviewed: 12/10/2014 Elsevier Interactive Patient Education  2017 [employer].

## 2020-03-29 ENCOUNTER — Encounter: Payer: Medicare Other | Admitting: *Deleted

## 2020-03-29 ENCOUNTER — Other Ambulatory Visit: Payer: Self-pay

## 2020-03-29 DIAGNOSIS — I5022 Chronic systolic (congestive) heart failure: Secondary | ICD-10-CM | POA: Diagnosis not present

## 2020-03-29 DIAGNOSIS — Z79899 Other long term (current) drug therapy: Secondary | ICD-10-CM | POA: Diagnosis not present

## 2020-03-29 DIAGNOSIS — I110 Hypertensive heart disease with heart failure: Secondary | ICD-10-CM | POA: Diagnosis not present

## 2020-03-29 DIAGNOSIS — E119 Type 2 diabetes mellitus without complications: Secondary | ICD-10-CM | POA: Diagnosis not present

## 2020-03-29 NOTE — Progress Notes (Signed)
Daily Session Note  Patient Details  Name: [name] MRN: [id number] Date of Birth: 1976-06-16 Referring Provider:     Cardiac Rehab from 11/04/2019 in [facility] Cardiac [facility] Pulmonary Rehab  Referring Provider  [name]      Encounter Date: 03/29/2020  Check In: Session Check In - 03/29/20 1159      Check-In   Supervising physician immediately available to respond to emergencies  See telemetry face sheet for immediately available ER MD    Location  [facility]-Cardiac & Pulmonary Rehab    Staff Present  Heath Lark, RN, BSN, CCRP;Amanda Sommer, BA, ACSM CEP, Exercise Physiologist;Joseph Hood RCP,RRT,BSRT    Virtual Visit  No    Medication changes reported      No    Fall or balance concerns reported     No    Warm-up and Cool-down  Performed on first and last piece of equipment    Resistance Training Performed  Yes    VAD Patient?  No    PAD/SET Patient?  No      Pain Assessment   Currently in Pain?  No/denies          Social History   Tobacco Use  Smoking Status Never Smoker  Smokeless Tobacco Never Used    Goals Met:  Independence with exercise equipment Exercise tolerated well No report of cardiac concerns or symptoms  Goals Unmet:  Not Applicable  Comments: Pt able to follow exercise prescription today without complaint.  Will continue to monitor for progression.    Dr. Emily Filbert is Medical Director for [place] and [facility].

## 2020-03-31 ENCOUNTER — Encounter: Payer: Medicare Other | Admitting: *Deleted

## 2020-03-31 ENCOUNTER — Other Ambulatory Visit: Payer: Self-pay

## 2020-03-31 DIAGNOSIS — I5022 Chronic systolic (congestive) heart failure: Secondary | ICD-10-CM | POA: Diagnosis not present

## 2020-03-31 DIAGNOSIS — Z79899 Other long term (current) drug therapy: Secondary | ICD-10-CM | POA: Diagnosis not present

## 2020-03-31 DIAGNOSIS — I110 Hypertensive heart disease with heart failure: Secondary | ICD-10-CM | POA: Diagnosis not present

## 2020-03-31 DIAGNOSIS — E119 Type 2 diabetes mellitus without complications: Secondary | ICD-10-CM | POA: Diagnosis not present

## 2020-03-31 NOTE — Progress Notes (Signed)
Daily Session Note  Patient Details  Name: [name] MRN: [id number] Date of Birth: 03-16-1976 Referring Provider:     Cardiac Rehab from 11/04/2019 in [facility]  Referring Provider  [name]      Encounter Date: 03/31/2020  Check In: Session Check In - 03/31/20 1116      Check-In   Supervising physician immediately available to respond to emergencies  See telemetry face sheet for immediately available ER MD    Location  [facility]-Cardiac & Pulmonary Rehab    Staff Present  Hope Budds RDN, LDN;Jessica [place], MA, RCEP, CCRP, CCET;Amanda Sommer, BA, ACSM CEP, Exercise Physiologist;Terrence Wishon [name], RN BSN    Virtual Visit  No    Medication changes reported      No    Fall or balance concerns reported     No    Warm-up and Cool-down  Performed on first and last piece of equipment    Resistance Training Performed  Yes    VAD Patient?  No    PAD/SET Patient?  No      Pain Assessment   Currently in Pain?  No/denies          Social History   Tobacco Use  Smoking Status Never Smoker  Smokeless Tobacco Never Used    Goals Met:  Independence with exercise equipment Exercise tolerated well No report of cardiac concerns or symptoms Strength training completed today  Goals Unmet:  Not Applicable  Comments: Pt able to follow exercise prescription today without complaint.  Will continue to monitor for progression.    Dr. Emily Filbert is Medical Director for [place] and [facility].

## 2020-04-06 ENCOUNTER — Encounter: Payer: Self-pay | Admitting: *Deleted

## 2020-04-06 DIAGNOSIS — I5022 Chronic systolic (congestive) heart failure: Secondary | ICD-10-CM

## 2020-04-06 NOTE — Progress Notes (Signed)
Cardiac Individual Treatment Plan  Patient Details  Name: [name] MRN: [id number] Date of Birth: [birth year] Referring Provider:     Cardiac Rehab from 11/04/2019 in [facility] Cardiac and Pulmonary Rehab  Referring Provider  [name]      Initial Encounter Date:    Cardiac Rehab from 11/04/2019 in [facility] Cardiac and Pulmonary Rehab  Date  11/04/19      Visit Diagnosis: Heart failure, chronic systolic (HCC)  Patient's Home Medications on Admission:  Current Outpatient Medications:  .  atorvastatin (LIPITOR) 40 MG tablet, Take 1 tablet (40 mg total) by mouth daily at 6 PM., Disp: 90 tablet, Rfl: 0 .  Blood Pressure Monitoring (BLOOD PRESSURE MONITOR/L CUFF) MISC, 1 Units by Does not apply route as needed., Disp: 1 each, Rfl: 0 .  carvedilol (COREG) 6.25 MG tablet, Take 1 tablet (6.25 mg total) by mouth 2 (two) times daily., Disp: 180 tablet, Rfl: 2 .  DULoxetine (CYMBALTA) 20 MG capsule, Take 2 capsules by mouth daily., Disp: , Rfl:  .  NOVOLIN 70/30 FLEXPEN (70-30) 100 UNIT/ML PEN, Inject into the skin 2 (two) times daily. Sliding scale, Disp: , Rfl:  .  potassium chloride SA (KLOR-CON) 20 MEQ tablet, Take 1 tablet (20 mEq total) by mouth 2 (two) times daily., Disp: 60 tablet, Rfl: 2 .  rOPINIRole (REQUIP) 0.25 MG tablet, Take 1 tablet by mouth 3 (three) times daily., Disp: , Rfl:  .  sacubitril-valsartan (ENTRESTO) 24-26 MG, Take 1 tablet by mouth 2 (two) times daily., Disp: 60 tablet, Rfl: 3 .  sildenafil (VIAGRA) 100 MG tablet, Take 1 tablet (100 mg total) by mouth daily as needed for erectile dysfunction. Take two hours prior to intercourse on an empty stomach, Disp: 30 tablet, Rfl: 2 .  torsemide (DEMADEX) 20 MG tablet, Take 1 tablet (20 mg total) by mouth daily., Disp: 90 tablet, Rfl: 3  Past Medical History: Past Medical History:  Diagnosis Date  . CHF (congestive heart failure) ([place])   . CKD (chronic kidney disease), stage III   . Diabetes mellitus   . ED  (erectile dysfunction)   . Hypertension     Tobacco Use: Social History   Tobacco Use  Smoking Status Never Smoker  Smokeless Tobacco Never Used    Labs: Recent Review Flowsheet Data    Labs for ITP Cardiac and Pulmonary Rehab Latest Ref Rng & Units 04/25/2011 02/09/2012 05/26/2018 09/14/2019 09/15/2019   Cholestrol 0 - 200 mg/dL - - 177 - 118   LDLCALC 0 - 99 mg/dL - - 104(H) - 60   HDL >40 mg/dL - - 57.00 - 48   Trlycerides <150 mg/dL - - 81.0 - 49   Hemoglobin A1c 4.8 - 5.6 % - - 13.4(A) 7.4(H) 7.5(H)   TCO2 0 - 100 mmol/L 30 29 - - -       Exercise Target Goals: Exercise Program Goal: Individual exercise prescription set using results from initial 6 min walk test and THRR while considering  patient's activity barriers and safety.   Exercise Prescription Goal: Initial exercise prescription builds to 30-45 minutes a day of aerobic activity, 2-3 days per week.  Home exercise guidelines will be given to patient during program as part of exercise prescription that the participant will acknowledge.   Education: Aerobic Exercise & Resistance Training: - Gives group verbal and written instruction on the various components of exercise. Focuses on aerobic and resistive training programs and the benefits of this training and how to safely progress through  these programs..   Education: Exercise & Equipment Safety: - Individual verbal instruction and demonstration of equipment use and safety with use of the equipment.   Education: Exercise Physiology & General Exercise Guidelines: - Group verbal and written instruction with models to review the exercise physiology of the cardiovascular system and associated critical values. Provides general exercise guidelines with specific guidelines to those with heart or lung disease.    Education: Flexibility, Balance, Mind/Body Relaxation: Provides group verbal/written instruction on the benefits of flexibility and balance training, including  mind/body exercise modes such as yoga, pilates and tai chi.  Demonstration and skill practice provided.   Activity Barriers & Risk Stratification: Activity Barriers & Cardiac Risk Stratification - 11/02/19 1556      Activity Barriers & Cardiac Risk Stratification   Activity Barriers  Arthritis;Back Problems;Shortness of Breath;Assistive Device       6 Minute Walk: 6 Minute Walk    Row Name 11/04/19 1605         6 Minute Walk   Phase  Initial     Distance  1242 feet     Walk Time  6 minutes     # of Rest Breaks  0     MPH  2.35     METS  4.4     RPE  12     VO2 Peak  15.41     Symptoms  Yes (comment)     Comments  6 minute walk test completed today. Patient did have some chest tightness that he reports as a regular occurence that his Cardiologist is aware of and attributes to his low EF. Participant felt better after rest and moving on to balance test and resistance training.     Resting HR  74 bpm     Resting BP  108/84     Exercise Oxygen Saturation  during 6 min walk  99 %     Max Ex. HR  122 bpm     Max Ex. BP  128/80     2 Minute Post BP  110/80        Oxygen Initial Assessment:   Oxygen Re-Evaluation:   Oxygen Discharge (Final Oxygen Re-Evaluation):   Initial Exercise Prescription: Initial Exercise Prescription - 11/04/19 1600      Date of Initial Exercise RX and Referring Provider   Date  11/04/19    Referring Provider  [name]      Treadmill   MPH  2.2    Grade  0    Minutes  15    METs  2.5      Recumbant Bike   Level  2    RPM  60    Watts  20    Minutes  15    METs  2.5      T5 Nustep   Level  2    SPM  60    Minutes  15    METs  2      Biostep-RELP   Level  2    SPM  50    Minutes  15    METs  2      Prescription Details   Frequency (times per week)  3    Duration  Progress to 30 minutes of continuous aerobic without signs/symptoms of physical distress      Intensity   THRR 40-80% of Max Heartrate  114-155     Ratings of Perceived Exertion  11-15    Perceived Dyspnea  0-4      Progression   Progression  Continue progressive overload as per policy without signs/symptoms or physical distress.      Resistance Training   Training Prescription  Yes    Weight  3    Reps  10-15       Perform Capillary Blood Glucose checks as needed.  Exercise Prescription Changes: Exercise Prescription Changes    Row Name 11/10/19 1400 11/27/19 1000 12/09/19 1400 12/23/19 1200 01/05/20 1300     Response to Exercise   Blood Pressure (Admit)  146/64  100/64  138/70  128/84  134/70   Blood Pressure (Exercise)  --  112/60  138/82  152/76  136/64   Blood Pressure (Exit)  120/70  94/60  90/60  130/70  116/60   Heart Rate (Admit)  85 bpm  87 bpm  114 bpm  78 bpm  97 bpm   Heart Rate (Exercise)  112 bpm  113 bpm  140 bpm  124 bpm  121 bpm   Heart Rate (Exit)  93 bpm  89 bpm  97 bpm  110 bpm  105 bpm   Rating of Perceived Exertion (Exercise)  '14  17  17  13  13   '$ Symptoms  none  fatigue on elliptical  --  hip pain on elliptical  --   Comments  1st day  --  --  --  --   Duration  Progress to 30 minutes of  aerobic without signs/symptoms of physical distress  Continue with 30 min of aerobic exercise without signs/symptoms of physical distress.  Continue with 30 min of aerobic exercise without signs/symptoms of physical distress.  Continue with 30 min of aerobic exercise without signs/symptoms of physical distress.  Continue with 30 min of aerobic exercise without signs/symptoms of physical distress.   Intensity  THRR unchanged  THRR unchanged  THRR unchanged  THRR unchanged  THRR unchanged     Progression   Progression  Continue to progress workloads to maintain intensity without signs/symptoms of physical distress.  Continue to progress workloads to maintain intensity without signs/symptoms of physical distress.  Continue to progress workloads to maintain intensity without signs/symptoms of physical distress.  Continue to  progress workloads to maintain intensity without signs/symptoms of physical distress.  Continue to progress workloads to maintain intensity without signs/symptoms of physical distress.   Average METs  2.7  2.91  2.7  2.94  3.25     Resistance Training   Training Prescription  Yes  Yes  Yes  Yes  Yes   Weight  3 lb  3 lb  3 lb  3 lb  8 lb   Reps  10-15  10-15  10-15  10-15  10-15     Interval Training   Interval Training  --  No  No  No  No     Treadmill   MPH  --  3  2.2  2.2  2.3   Grade  --  1  0  0  0   Minutes  --  '15  15  15  15   '$ METs  --  3.71  2.69  2.68  2.69     Recumbant Bike   Level  1  --  --  7  --   RPM  60  --  --  --  --   Watts  25  --  --  40  --   Minutes  15  --  --  15  --   METs  2.8  --  --  3.26  --     NuStep   Level  2  --  --  5  --   SPM  80  --  --  --  --   Minutes  15  --  --  15  --   METs  2.1  --  --  2.9  --     Elliptical   Level  --  1  1  --  --   Speed  --  2.5  2.5  --  --   Minutes  --  10 2-5 min bouts  10 short bouts as tolerated  --  --     REL-XR   Level  --  --  --  --  4   Minutes  --  --  --  --  15   METs  --  --  --  --  3.8     T5 Nustep   Level  --  2  --  --  --   Minutes  --  15  --  --  --   METs  --  2.1  --  --  --     Home Exercise Plan   Plans to continue exercise at  --  --  --  Home (comment) walking  --   Frequency  --  --  --  Add 3 additional days to program exercise sessions.  --   Initial Home Exercises Provided  --  --  --  12/10/19  --   [place] Name 01/20/20 1300 02/03/20 1500 02/16/20 1500 03/03/20 1300 03/15/20 1600     Response to Exercise   Blood Pressure (Admit)  122/72  128/82  124/70  110/74  110/78   Blood Pressure (Exercise)  144/80  142/82  136/64  132/68  140/78   Blood Pressure (Exit)  108/64  102/78  134/64  122/62  106/68   Heart Rate (Admit)  86 bpm  95 bpm  104 bpm  93 bpm  79 bpm   Heart Rate (Exercise)  135 bpm  127 bpm  130 bpm  116 bpm  109 bpm   Heart Rate (Exit)  103 bpm   89 bpm  107 bpm  91 bpm  88 bpm   Rating of Perceived Exertion (Exercise)  '15  17  13  12  17   '$ Symptoms  --  --  hip pain  --  hip pain   Duration  Continue with 30 min of aerobic exercise without signs/symptoms of physical distress.  Continue with 30 min of aerobic exercise without signs/symptoms of physical distress.  Continue with 30 min of aerobic exercise without signs/symptoms of physical distress.  Continue with 30 min of aerobic exercise without signs/symptoms of physical distress.  Continue with 30 min of aerobic exercise without signs/symptoms of physical distress.   Intensity  THRR unchanged  THRR unchanged  THRR unchanged  THRR unchanged  THRR unchanged     Progression   Progression  Continue to progress workloads to maintain intensity without signs/symptoms of physical distress.  Continue to progress workloads to maintain intensity without signs/symptoms of physical distress.  Continue to progress workloads to maintain intensity without signs/symptoms of physical distress.  Continue to progress workloads to maintain intensity without signs/symptoms of physical distress.  Continue to progress workloads to maintain intensity without signs/symptoms of physical distress.   Average METs  3.13  2.6  2.7  3.23  3.02     Resistance Training   Training Prescription  Yes  Yes  Yes  --  Yes   Weight  8 lb  8 lb  8 lb  --  8 lb   Reps  10-15  10-15  10-15  --  10-15     Interval Training   Interval Training  No  No  No  --  No     Treadmill   MPH  2.3  1.9  2.3  --  2.3   Grade  0  0  0  --  0   Minutes  '15  15  15  '$ --  15   METs  2.76  --  2.76  --  2.76     REL-XR   Level  4  --  --  --  5   Minutes  15  --  --  --  15   METs  3.5  --  --  --  3.9     T5 Nustep   Level  --  5  --  --  5   SPM  --  80  --  --  --   Minutes  --  15  --  --  15   METs  --  2.5  --  --  2.4     Home Exercise Plan   Plans to continue exercise at  Home (comment) walking  Home (comment) walking  Home  (comment) walking  --  Home (comment) walking   Frequency  Add 3 additional days to program exercise sessions.  Add 3 additional days to program exercise sessions.  Add 3 additional days to program exercise sessions.  --  Add 3 additional days to program exercise sessions.   Initial Home Exercises Provided  12/10/19  12/10/19  12/10/19  --  12/10/19   Row Name 03/28/20 1700             Response to Exercise   Blood Pressure (Admit)  110/64       Blood Pressure (Exercise)  124/72       Blood Pressure (Exit)  108/60       Heart Rate (Admit)  67 bpm       Heart Rate (Exercise)  113 bpm       Rating of Perceived Exertion (Exercise)  14       Duration  Continue with 30 min of aerobic exercise without signs/symptoms of physical distress.       Intensity  THRR unchanged         Progression   Progression  Continue to progress workloads to maintain intensity without signs/symptoms of physical distress.       Average METs  3.83         Resistance Training   Training Prescription  Yes       Weight  8 lb       Reps  10-15         Interval Training   Interval Training  No         Treadmill   MPH  2.4       Grade  0       Minutes  15       METs  2.76         REL-XR   Level  5       Minutes  15       METs  4.9          Exercise Comments: Exercise Comments    Row Name 11/09/19 9163 11/26/19 1049 01/19/20 1025 01/19/20 1118     Exercise Comments  First full day of exercise!  Patient was oriented to gym and equipment including functions, settings, policies, and procedures.  Patient's individual exercise prescription and treatment plan were reviewed.  All starting workloads were established based on the results of the 6 minute walk test done at initial orientation visit.  The plan for exercise progression was also introduced and progression will be customized based on patient's performance and goals.  Rons sciatica has been bothering him.  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  Staff showed [name] stretches (modified figure 4 )  and recommended PT consult in the meantime.  [name] is experiencing left shoulder discomfort.  Thinks he pulled a muscle. Movement of shoulder creates discomfort.  Advised to call MD if this continues or gets worse. Also advised not to use left arm to hard today. He verbalized understanding.  Sian stated he did fine today, no further discomfort while exercising.       Exercise Goals and Review: Exercise Goals    Row Name 11/04/19 1627             Exercise Goals   Increase Physical Activity  Yes       Intervention  Provide advice, education, support and counseling about physical activity/exercise needs.;Develop an individualized exercise prescription for aerobic and resistive training based on initial evaluation findings, risk stratification, comorbidities and participant's personal goals.       Expected Outcomes  Short Term: Attend rehab on a regular basis to increase amount of physical activity.;Long Term: Add in home exercise to make exercise part of routine and to increase amount of physical activity.;Long Term: Exercising regularly at least 3-5 days a week.       Increase Strength and Stamina  Yes       Intervention  Provide advice, education, support and counseling about physical activity/exercise needs.;Develop an individualized exercise prescription for aerobic and resistive training based on initial evaluation findings, risk stratification, comorbidities and participant's personal goals.       Expected Outcomes  Short Term: Increase workloads from initial exercise prescription for resistance, speed, and METs.;Short Term: Perform resistance training exercises routinely during rehab and add in resistance training at home;Long Term: Improve cardiorespiratory fitness, muscular endurance and strength as measured by increased METs and functional capacity (6MWT)       Able to understand and use rate of  perceived exertion (RPE) scale  Yes       Intervention  Provide education and explanation on how to use RPE scale       Expected Outcomes  Short Term: Able to use RPE daily in rehab to express subjective intensity level;Long Term:  Able to use RPE to guide intensity level when exercising independently       Able to understand and use Dyspnea scale  Yes       Intervention  Provide education and explanation on how to use Dyspnea scale       Expected Outcomes  Short Term: Able to use Dyspnea scale daily in rehab to express subjective sense of shortness of breath during exertion;Long Term: Able to use Dyspnea scale to guide intensity level when exercising independently       Knowledge and understanding of Target Heart Rate Range (  THRR)  Yes       Intervention  Provide education and explanation of THRR including how the numbers were predicted and where they are located for reference       Expected Outcomes  Short Term: Able to state/look up THRR;Long Term: Able to use THRR to govern intensity when exercising independently;Short Term: Able to use daily as guideline for intensity in rehab       Able to check pulse independently  Yes       Intervention  Provide education and demonstration on how to check pulse in carotid and radial arteries.;Review the importance of being able to check your own pulse for safety during independent exercise       Expected Outcomes  Short Term: Able to explain why pulse checking is important during independent exercise;Long Term: Able to check pulse independently and accurately       Understanding of Exercise Prescription  Yes       Intervention  Provide education, explanation, and written materials on patient's individual exercise prescription       Expected Outcomes  Short Term: Able to explain program exercise prescription;Long Term: Able to explain home exercise prescription to exercise independently          Exercise Goals Re-Evaluation : Exercise Goals Re-Evaluation     Row Name 11/09/19 4097 11/27/19 1035 12/09/19 1457 12/10/19 1041 12/23/19 1211     Exercise Goal Re-Evaluation   Exercise Goals Review  Able to understand and use rate of perceived exertion (RPE) scale;Able to understand and use Dyspnea scale;Able to check pulse independently;Understanding of Exercise Prescription  Increase Physical Activity;Increase Strength and Stamina;Understanding of Exercise Prescription  Increase Physical Activity;Increase Strength and Stamina;Able to understand and use rate of perceived exertion (RPE) scale;Able to understand and use Dyspnea scale;Able to check pulse independently;Knowledge and understanding of Target Heart Rate Range (THRR);Understanding of Exercise Prescription  Increase Physical Activity;Increase Strength and Stamina;Understanding of Exercise Prescription  Increase Physical Activity;Increase Strength and Stamina;Understanding of Exercise Prescription   Comments  Reviewed RPE scale, THR and program prescription with pt today.  Pt voiced understanding and was given a copy of goals to take home.  [name] is off to a good start in rehab. He tried the treadmill and elliptical yesterday.  He did well on the treadmill, but the elliptical was challenging.  He also complained of some sciattica and hip pain. 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  We talked with him about stretching and strengthening his hip to help avoid surgery.  We also recommended a new PT consult to help as well.  He was open to all options.  We will continue to monitor his hip and progress.  [name] finds the elliptical very challenging.  He is doing short bouts on the elliptical  [name] is doing well in rehab.  He has not been doing much at home.  He usually stretches and walks around block for about 10 min. Reviewed home exercise with pt today.  Pt plans to walk and stretch at home for exercise.  Reviewed THR, pulse, RPE, sign and symptoms, NTG use, and when to call 911 or  MD.  Also discussed weather considerations and indoor options.  Pt voiced understanding.  [name] has been doing well in rehab.  He was able to do well on the bike and we have moved him off the elliptical as it really aggravated his hip.  We will continue to montior his progress.  Expected Outcomes  Short: Use RPE daily to regulate intensity. Long: Follow program prescription in THR.  Short: Try stretching and strengthen to support hip.  Long: Continue to follow program prescription and attend regularly  Short - build endurance on elliptical Long:  improve overall stamina  Short: Increase walking time at home. Long: Continue to improve stamina.  Short: Increase workload on treadmill.  Long: Continue to add in exercise at home.   [place] Name 12/29/19 1031 01/05/20 1308 01/20/20 1344 02/03/20 1552 02/16/20 1525     Exercise Goal Re-Evaluation   Exercise Goals Review  Increase Physical Activity;Increase Strength and Stamina;Understanding of Exercise Prescription  Increase Physical Activity;Increase Strength and Stamina;Able to understand and use rate of perceived exertion (RPE) scale;Able to understand and use Dyspnea scale;Knowledge and understanding of Target Heart Rate Range (THRR);Able to check pulse independently;Understanding of Exercise Prescription  Increase Physical Activity;Increase Strength and Stamina;Understanding of Exercise Prescription  Increase Physical Activity;Increase Strength and Stamina;Able to understand and use rate of perceived exertion (RPE) scale;Able to understand and use Dyspnea scale;Knowledge and understanding of Target Heart Rate Range (THRR);Able to check pulse independently;Understanding of Exercise Prescription  Increase Physical Activity;Increase Strength and Stamina;Understanding of Exercise Prescription   Comments  Weighted sit-ups 5 on each arm - will do 20 minutes 3x/week. Does laps around the store and other unstructured exercise. Pt reports walking on road hurts his feet and he  is fatigued and his body is hurting (leg and back).  [name] has increased to 8 ob weights fro strength work and lincreased TM speed.  He is exercsising some at home.  Staff will monitor progress.  [name] continues to do well in rehab.  Yesterday we limited his arms as he had been having some shoulder pain that seemed muscular in nature.  He is up to level 4 on the XR.  We will continue to monitor his progress.  [name] has only attended twice in March.  Consistent exercise will yield more progress.  [name] has only attended once since last review (3 total for March).  He is able to come in to complete exercise but lacks progression due to lack of consistent attendance.  We will continue to monitor his progress.   Expected Outcomes  Short: Increase workload on treadmill.  Long: Continue to add in exercise at home.  --  Short: Add incline to treadmill if hip able to tolerate.  Long: Continue to improve stamina.  Short : attend/exercise consistently Long: improve overall MET level  Short: Attend regularly  Long: Continue to improve stamina.   [place] Name 02/23/20 1024 03/03/20 1325 03/15/20 1605 03/28/20 1706 03/29/20 1121     Exercise Goal Re-Evaluation   Exercise Goals Review  Increase Physical Activity;Increase Strength and Stamina;Understanding of Exercise Prescription  Increase Physical Activity;Increase Strength and Stamina;Able to understand and use rate of perceived exertion (RPE) scale;Able to understand and use Dyspnea scale;Knowledge and understanding of Target Heart Rate Range (THRR);Able to check pulse independently;Understanding of Exercise Prescription  Increase Physical Activity;Increase Strength and Stamina;Understanding of Exercise Prescription  Increase Physical Activity;Increase Strength and Stamina;Able to understand and use rate of perceived exertion (RPE) scale;Able to understand and use Dyspnea scale;Knowledge and understanding of Target Heart Rate Range (THRR);Able to check pulse  independently;Understanding of Exercise Prescription  Increase Physical Activity;Increase Strength and Stamina;Understanding of Exercise Prescription   Comments  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  [name] has not had regular attendance.  He is active outside program sessions  [name] continues to have poor attendance and only attended twice since last review.  He would greatly benefit from improved attendance.  He was able to use the T5 NuStep today for the full 30 min.  We will continue to monitor his progress  [name] has changed to a different time slot that should allow him to attend more consistently.  [name] is doing well in rehab.  He is walking at home.  He has also a star roller now that he is using on his hip. He has also gotten a TENs unit to use.  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  Overall, he is feeling stronger and has more stamina.   Expected Outcomes  Short: Attend regularly  Long: Continue to improve stamina.  Short : attend regulalry Long: increase overall MET level  Short: Improved attendance  Long; Continue to improve stamina  Short: attend regularly Long: increase stamina  Short: Try out TENs unit on hip  Long: Conintue to improve stamina.      Discharge Exercise Prescription (Final Exercise Prescription Changes): Exercise Prescription Changes - 03/28/20 1700      Response to Exercise   Blood Pressure (Admit)  110/64    Blood Pressure (Exercise)  124/72    Blood Pressure (Exit)  108/60    Heart Rate (Admit)  67 bpm    Heart Rate (Exercise)  113 bpm    Rating of Perceived Exertion (Exercise)  14    Duration  Continue with 30 min of aerobic exercise without signs/symptoms of physical distress.    Intensity  THRR unchanged      Progression   Progression  Continue to progress workloads to maintain intensity without signs/symptoms of physical  distress.    Average METs  3.83      Resistance Training   Training Prescription  Yes    Weight  8 lb    Reps  10-15      Interval Training   Interval Training  No      Treadmill   MPH  2.4    Grade  0    Minutes  15    METs  2.76      REL-XR   Level  5    Minutes  15    METs  4.9       Nutrition:  Target Goals: Understanding of nutrition guidelines, daily intake of sodium '1500mg'$ , cholesterol '200mg'$ , calories 30% from fat and 7% or less from saturated fats, daily to have 5 or more servings of fruits and vegetables.  Education: Controlling Sodium/Reading Food Labels -Group verbal and written material supporting the discussion of sodium use in heart healthy nutrition. Review and explanation with models, verbal and written materials for utilization of the food label.   Education: General Nutrition Guidelines/Fats and Fiber: -Group instruction provided by verbal, written material, models and posters to present the general guidelines for heart healthy nutrition. Gives an explanation and review of dietary fats and fiber.   Biometrics: Pre Biometrics - 11/04/19 1631      Pre Biometrics   Height  6' 1.5" (1.867 m)    Weight  221 lb 14.4 oz (100.7 kg)    BMI (Calculated)  28.88    Single Leg Stand  30 seconds        Nutrition Therapy Plan and Nutrition Goals:   Nutrition Assessments:   MEDIFICTS  Score Key:          ?70 Need to make dietary changes          40-70 Heart Healthy Diet         ? 40 Therapeutic Level Cholesterol Diet  Nutrition Goals Re-Evaluation: Nutrition Goals Re-Evaluation    [place] Name 03/29/20 1135             Goals   Nutrition Goal  Find healthy non-perishable snacks       Comment  Short: Start stocking snacks in car  Long: Continue to work towards healthy diet       Expected Outcome  Short: Start stocking snacks in car  Long: Continue to work towards healthy diet          Nutrition Goals Discharge (Final Nutrition Goals  Re-Evaluation): Nutrition Goals Re-Evaluation - 03/29/20 1135      Goals   Nutrition Goal  Find healthy non-perishable snacks    Comment  Short: Start stocking snacks in car  Long: Continue to work towards healthy diet    Expected Outcome  Short: Start stocking snacks in car  Long: Continue to work towards healthy diet       Psychosocial: Target Goals: Acknowledge presence or absence of significant depression and/or stress, maximize coping skills, provide positive support system. Participant is able to verbalize types and ability to use techniques and skills needed for reducing stress and depression.   Education: Depression - Provides group verbal and written instruction on the correlation between heart/lung disease and depressed mood, treatment options, and the stigmas associated with seeking treatment.   Education: Sleep Hygiene -Provides group verbal and written instruction about how sleep can affect your health.  Define sleep hygiene, discuss sleep cycles and impact of sleep habits. Review good sleep hygiene tips.     Education: Stress and Anxiety: - Provides group verbal and written instruction about the health risks of elevated stress and causes of high stress.  Discuss the correlation between heart/lung disease and anxiety and treatment options. Review healthy ways to manage with stress and anxiety.    Initial Review & Psychosocial Screening:   Quality of Life Scores:  Quality of Life - 11/04/19 1621      Quality of Life   Select  Quality of Life      Quality of Life Scores   Health/Function Pre  11.6 %    Socioeconomic Pre  25.5 %    Psych/Spiritual Pre  26.57 %    Family Pre  27.6 %    GLOBAL Pre  20.06 %      Scores of 19 and below usually indicate a poorer quality of life in these areas.  A difference of  2-3 points is a clinically meaningful difference.  A difference of 2-3 points in the total score of the Quality of Life Index has been associated with  significant improvement in overall quality of life, self-image, physical symptoms, and general health in studies assessing change in quality of life.  PHQ-9: Recent Review Flowsheet Data    Depression screen [facility] 2/9 03/24/2020 11/24/2019 05/26/2018 04/26/2014   Decreased Interest 0 0 0 0   Down, Depressed, Hopeless 0 0 0 0   PHQ - 2 Score 0 0 0 0   Altered sleeping 0 - - -   Tired, decreased energy 0 - - -   Change in appetite 0 - - -   Feeling bad or failure about yourself  0 - - -  Trouble concentrating 0 - - -   Moving slowly or fidgety/restless 0 - - -   Suicidal thoughts 0 - - -   PHQ-9 Score 0 - - -   Difficult doing work/chores Not difficult at all - - -     Interpretation of Total Score  Total Score Depression Severity:  1-4 = Minimal depression, 5-9 = Mild depression, 10-14 = Moderate depression, 15-19 = Moderately severe depression, 20-27 = Severe depression   Psychosocial Evaluation and Intervention:   Psychosocial Re-Evaluation: Psychosocial Re-Evaluation    [place] Name 12/10/19 1044 12/29/19 1035 02/23/20 1029 03/29/20 1127       Psychosocial Re-Evaluation   Current issues with  Current Stress Concerns  Current Stress Concerns  Current Stress Concerns  Current Stress Concerns    Comments  [name] is doing well in rehab.  His hips are his biggest limitations for exercise and mobility.  His health is his biggest stressor.  He gets at least 6 hours of sleep a night.  His hips are his biggest limitations for exercise and mobility.  His health is his biggest stressor, but his health is not a "mental stressor". Pt reports wife is a good support system and that she will cook for him.  He gets at least 6 hours of sleep a night, body pain sometimes makes it hard to sleep; will get on average 8-9 hours of sleep with 2-3 interuptions from pain or bathroom.  Pt reports stress levels are fine and he is doing well with his job and at home. Pt reports hvaing a good support system. Pt reports still  sleeping well. Pt reports having pain all the time; neuropathy and herniated disc and pinched nerve and shoulder pain. Pt reports doctor wants him to get surgery, but with evewrthing else has not done it.  [name] is doing well in rehab.  He has started to get some tools to use at home to help with pain levels.  He is doing well mentally.  He continues to sleep well for the most part.  His pain in hip and shoulder can interrupt his sleep some.    Expected Outcomes  Short: Continue to attend to build hip strength.  Long: COntinue to cope positively  Short: Continue to attend to build hip strength.  Long: Continue to cope positively  Short: Continue to attend to build hip strength.  Long: Continue to cope positively  Short: Continue to attend to build hip strength.  Long: Continue to cope positively    Interventions  Encouraged to attend Cardiac Rehabilitation for the exercise  Encouraged to attend Cardiac Rehabilitation for the exercise  Encouraged to attend Cardiac Rehabilitation for the exercise  Encouraged to attend Cardiac Rehabilitation for the exercise    Continue Psychosocial Services   Follow up required by staff  Follow up required by staff  Follow up required by staff  --       Psychosocial Discharge (Final Psychosocial Re-Evaluation): Psychosocial Re-Evaluation - 03/29/20 1127      Psychosocial Re-Evaluation   Current issues with  Current Stress Concerns    Comments  [name] is doing well in rehab.  He has started to get some tools to use at home to help with pain levels.  He is doing well mentally.  He continues to sleep well for the most part.  His pain in hip and shoulder can interrupt his sleep some.    Expected Outcomes  Short: Continue to attend to build hip strength.  Long:  Continue to cope positively    Interventions  Encouraged to attend Cardiac Rehabilitation for the exercise       Vocational Rehabilitation: Provide vocational rehab assistance to qualifying candidates.   Vocational  Rehab Evaluation & Intervention:   Education: Education Goals: Education classes will be provided on a variety of topics geared toward better understanding of heart health and risk factor modification. Participant will state understanding/return demonstration of topics presented as noted by education test scores.  Learning Barriers/Preferences: Learning Barriers/Preferences - 11/02/19 1607      Learning Barriers/Preferences   Learning Barriers  None    Learning Preferences  Individual Instruction       General Cardiac Education Topics:  AED/CPR: - Group verbal and written instruction with the use of models to demonstrate the basic use of the AED with the basic ABC's of resuscitation.   Anatomy & Physiology of the Heart: - Group verbal and written instruction and models provide basic cardiac anatomy and physiology, with the coronary electrical and arterial systems. Review of Valvular disease and Heart Failure   Cardiac Procedures: - Group verbal and written instruction to review commonly prescribed medications for heart disease. Reviews the medication, class of the drug, and side effects. Includes the steps to properly store meds and maintain the prescription regimen. (beta blockers and nitrates)   Cardiac Medications I: - Group verbal and written instruction to review commonly prescribed medications for heart disease. Reviews the medication, class of the drug, and side effects. Includes the steps to properly store meds and maintain the prescription regimen.   Cardiac Medications II: -Group verbal and written instruction to review commonly prescribed medications for heart disease. Reviews the medication, class of the drug, and side effects. (all other drug classes)    Go Sex-Intimacy & Heart Disease, Get SMART - Goal Setting: - Group verbal and written instruction through game format to discuss heart disease and the return to sexual intimacy. Provides group verbal and written  material to discuss and apply goal setting through the application of the S.M.A.R.T. Method.   Other Matters of the Heart: - Provides group verbal, written materials and models to describe Stable Angina and Peripheral Artery. Includes description of the disease process and treatment options available to the cardiac patient.   Infection Prevention: - Provides verbal and written material to individual with discussion of infection control including proper hand washing and proper equipment cleaning during exercise session.   Falls Prevention: - Provides verbal and written material to individual with discussion of falls prevention and safety.   Other: -Provides group and verbal instruction on various topics (see comments)   Knowledge Questionnaire Score: Knowledge Questionnaire Score - 11/04/19 1623      Knowledge Questionnaire Score   Pre Score  24/26       Core Components/Risk Factors/Patient Goals at Admission: Personal Goals and Risk Factors at Admission - 11/04/19 1623      Core Components/Risk Factors/Patient Goals on Admission   Diabetes  Yes    Intervention  Provide education about signs/symptoms and action to take for hypo/hyperglycemia.;Provide education about proper nutrition, including hydration, and aerobic/resistive exercise prescription along with prescribed medications to achieve blood glucose in normal ranges: Fasting glucose 65-99 mg/dL    Expected Outcomes  Short Term: Participant verbalizes understanding of the signs/symptoms and immediate care of hyper/hypoglycemia, proper foot care and importance of medication, aerobic/resistive exercise and nutrition plan for blood glucose control.;Long Term: Attainment of HbA1C < 7%.    Heart Failure  Yes  Intervention  Provide a combined exercise and nutrition program that is supplemented with education, support and counseling about heart failure. Directed toward relieving symptoms such as shortness of breath, decreased  exercise tolerance, and extremity edema.    Expected Outcomes  Improve functional capacity of life;Short term: Attendance in program 2-3 days a week with increased exercise capacity. Reported lower sodium intake. Reported increased fruit and vegetable intake. Reports medication compliance.;Short term: Daily weights obtained and reported for increase. Utilizing diuretic protocols set by physician.;Long term: Adoption of self-care skills and reduction of barriers for early signs and symptoms recognition and intervention leading to self-care maintenance.    Hypertension  Yes    Intervention  Provide education on lifestyle modifcations including regular physical activity/exercise, weight management, moderate sodium restriction and increased consumption of fresh fruit, vegetables, and low fat dairy, alcohol moderation, and smoking cessation.;Monitor prescription use compliance.    Expected Outcomes  Short Term: Continued assessment and intervention until BP is < 140/74m HG in hypertensive participants. < 130/868mHG in hypertensive participants with diabetes, heart failure or chronic kidney disease.;Long Term: Maintenance of blood pressure at goal levels.    Lipids  Yes    Intervention  Provide education and support for participant on nutrition & aerobic/resistive exercise along with prescribed medications to achieve LDL '70mg'$ , HDL >'40mg'$ .    Expected Outcomes  Short Term: Participant states understanding of desired cholesterol values and is compliant with medications prescribed. Participant is following exercise prescription and nutrition guidelines.;Long Term: Cholesterol controlled with medications as prescribed, with individualized exercise RX and with personalized nutrition plan. Value goals: LDL < '70mg'$ , HDL > 40 mg.    Stress  Yes    Intervention  Offer individual and/or small group education and counseling on adjustment to heart disease, stress management and health-related lifestyle change. Teach and  support self-help strategies.    Personal Goal Other  Yes    Personal Goal  STG- Participant wants to get back to feeling like he's in his 1937'snd breathing freely. LTG- Participant wants to be able to function better all around.    Intervention  Begin Cardiac Rehab program, learn about and implement heart healthy living,develop habit of exercising and taking care of self.    Expected Outcomes  Accomplishing the set at intake and setting new goals. Finding new normal and how to live comfortably with new diagnosis.       Education:Diabetes - Individual verbal and written instruction to review signs/symptoms of diabetes, desired ranges of glucose level fasting, after meals and with exercise. Acknowledge that pre and post exercise glucose checks will be done for 3 sessions at entry of program.   Education: Know Your Numbers and Risk Factors: -Group verbal and written instruction about important numbers in your health.  Discussion of what are risk factors and how they play a role in the disease process.  Review of Cholesterol, Blood Pressure, Diabetes, and BMI and the role they play in your overall health.   Core Components/Risk Factors/Patient Goals Review:  Goals and Risk Factor Review    [place] Name 12/10/19 1046 12/29/19 1039 02/23/20 1026 03/29/20 1128       Core Components/Risk Factors/Patient Goals Review   Personal Goals Review  Weight Management/Obesity;Hypertension;Heart Failure;Diabetes  Weight Management/Obesity;Hypertension;Heart Failure;Diabetes  Weight Management/Obesity;Hypertension;Heart Failure;Diabetes  Weight Management/Obesity;Hypertension;Heart Failure;Diabetes    Review  [name] is doing well.  His weight has been steady.  However he is starting to notice changes in his body composition.  His clothes are starting to  fit differently.  Blood sugars have been up and down but are starting to regulate more.  He is also starting to watch his soduim levels.  He is just having some chest  pressure but no other heart failure symptoms.  Pt reports that his weight is stable but his clothes are now much looser and may need new clothes soon. He reports his BG is eratic; 300 yesterday and last night is was 99, bottomed out overnight and drank some juice, this morning it was 94. He reports eating consistently. On insulin, he says his MD is going to change meds to help with this. He is still feeling chest pressure and light headed if he gets up too quickly, but no other HF symptoms.  Pt has reported increased weight, does not feel like it is muscle mass. Pt reports still on insulin, blood sugar varyies 300 -->70 without doing anything differently. not feeling lightheaded when stands anymore.  [name] is doing well in rehab.  His weight has been creeping up again.  His lowest in past few years was 198 lb and he wants to get back to 215-220 lb.  He has gotten back to eating out again.  We talked about trying to plan ahead some and then looking for snacks that are non-perishable.  [name] is still watching his blood sugars but his wife broke his needle.  He is going to get a new one.  He is still having some chest pressure from the heart failure but still has not talked to doctor about it still going on.  We talked about sending doc a message.  He doing well with his pressures and still checks them at home.    Expected Outcomes  Short: Continue to work on weight.  Long: Continue to monitor risk factors.  Short: Continue to work on strength Long: Continue to monitor risk factors.  Short: Continue to work on strength Long: Continue to monitor risk factors.  Short: Send doctor note about chest pain and get new needle for diabetes check.  Long: Continue to manage risk factors.       Core Components/Risk Factors/Patient Goals at Discharge (Final Review):  Goals and Risk Factor Review - 03/29/20 1128      Core Components/Risk Factors/Patient Goals Review   Personal Goals Review  Weight  Management/Obesity;Hypertension;Heart Failure;Diabetes    Review  [name] is doing well in rehab.  His weight has been creeping up again.  His lowest in past few years was 198 lb and he wants to get back to 215-220 lb.  He has gotten back to eating out again.  We talked about trying to plan ahead some and then looking for snacks that are non-perishable.  [name] is still watching his blood sugars but his wife broke his needle.  He is going to get a new one.  He is still having some chest pressure from the heart failure but still has not talked to doctor about it still going on.  We talked about sending doc a message.  He doing well with his pressures and still checks them at home.    Expected Outcomes  Short: Send doctor note about chest pain and get new needle for diabetes check.  Long: Continue to manage risk factors.       ITP Comments: ITP Comments    [place] Name 11/02/19 1553 11/04/19 1603 11/09/19 0925 11/18/19 0851 12/16/19 1228   ITP Comments  Virtual Eval/Intake Call/ EP Orientation Wed 11/04/19 at 1:30pm Diagnosis Documentation can  be found in [facility] 10/26.  6 minute walk test completed today. Patient did have some chest tightness that he reports as a regular occurence that his Cardiologist is aware of and attributes to his low EF. Participant felt better after rest and moving on to balance test and resistance training.  First full day of exercise!  Patient was oriented to gym and equipment including functions, settings, policies, and procedures.  Patient's individual exercise prescription and treatment plan were reviewed.  All starting workloads were established based on the results of the 6 minute walk test done at initial orientation visit.  The plan for exercise progression was also introduced and progression will be customized based on patient's performance and goals.  30 day review competed . ITP sent to Dr Emily Filbert for review, changes as needed and ITP approval signature  New to program  30 day review  completed. ITP sent to Dr. Emily Filbert, Medical Director of Cardiac and Pulmonary Rehab. Continue with ITP unless changes are made by physician.  Department operating under reduced schedule until further notice by request from hospital leadership.   [place] Name 01/13/20 0629 01/19/20 1022 01/19/20 1117 02/10/20 0633 02/16/20 1524   ITP Comments  30 day chart review completed. ITP sent to Dr Zachery Dakins Medical Director, for review,changes as needed and signature.  Alois is experiencing left shoulder discomfort.  Thinks he pulled a muscle. Movement of shoulder creates discomfort.  Advised to call MD if this continues or gets worse. Also advised not to use left arm to hard today. He verbalized understanding.  Janice stated he did fine today, no further discomfort while exercising.  30 day chart review completed. ITP sent to Dr Zachery Dakins Medical Director, for review,changes as needed and signature. Continue with ITP if no changes requested  [name]'s attendance has been spotted, only attended 3 times this month for various reasons.   [place] Name 03/09/20 [phone] 03/17/20 1429 04/06/20 0614       ITP Comments  30 Day review completed. Medical Director review done, changes made as directed,and approval shown by signature of Market researcher.  Called to check on [name].  Left message about lack of attendance and request for improved attendance to maintain spot or look into possible virtual program.  Sent letter.  30 Day review completed. ITP review done, changes made as directed,and approval shown by signature of  Scientist, research (life sciences).        Comments:

## 2020-04-07 ENCOUNTER — Other Ambulatory Visit: Payer: Self-pay

## 2020-04-07 ENCOUNTER — Encounter: Payer: Medicare Other | Admitting: *Deleted

## 2020-04-07 ENCOUNTER — Other Ambulatory Visit: Payer: Self-pay | Admitting: Family Medicine

## 2020-04-07 ENCOUNTER — Telehealth (INDEPENDENT_AMBULATORY_CARE_PROVIDER_SITE_OTHER): Payer: Medicare Other | Admitting: Urology

## 2020-04-07 DIAGNOSIS — I110 Hypertensive heart disease with heart failure: Secondary | ICD-10-CM | POA: Diagnosis not present

## 2020-04-07 DIAGNOSIS — N5201 Erectile dysfunction due to arterial insufficiency: Secondary | ICD-10-CM

## 2020-04-07 DIAGNOSIS — Z79899 Other long term (current) drug therapy: Secondary | ICD-10-CM | POA: Diagnosis not present

## 2020-04-07 DIAGNOSIS — E119 Type 2 diabetes mellitus without complications: Secondary | ICD-10-CM | POA: Diagnosis not present

## 2020-04-07 DIAGNOSIS — I5022 Chronic systolic (congestive) heart failure: Secondary | ICD-10-CM

## 2020-04-07 LAB — GLUCOSE, CAPILLARY: Glucose-Capillary: 359 mg/dL — ABNORMAL HIGH (ref 70–99)

## 2020-04-07 MED ORDER — AMBULATORY NON FORMULARY MEDICATION: 0 refills | Status: DC

## 2020-04-07 NOTE — Progress Notes (Signed)
Virtual Visit via Telephone Note  I connected with [name] on 04/07/20 at  3:30 PM EDT by telephone and verified that I am speaking with the correct person using two identifiers.  Location: Patient: Car? Provider: Office   I discussed the limitations, risks, security and privacy concerns of performing an evaluation and management service by telephone and the availability of in person appointments. I also discussed with the patient that there may be a patient responsible charge related to this service. The patient expressed understanding and agreed to proceed.   History of Present Illness: KRYSTIAN YOUNGLOVE is a 44 y.o. male with cardiomyopathy, hypertension, diabetes mellitus, hyperlipidemia and erectile dysfunction who is contacted today to discuss his erectile dysfunction.  He was given a trial of 100 mg sildenafil and did not experience any improvement in his erections.  He is not having painful erections or curvatures in his erections.  He is no longer having spontaneous erections.   Observations/Objective: Patient does not sound distressed and is answering questions appropriately.  Assessment and Plan:  Erectile dysfunction -Failed PDE 5 inhibitors I discussed with the patient how intracavernosal injections are administered -I explained that our procedure is to schedule a Trimix test dosing, which is a morning appointment, in which he presents to the office with his Trimix medication and we will slowly titrate the medication in office until he achieves an adequate erection -It is important that he does not have elevated blood pressure at this visit as we will not be allowed to initiate the titration for the risk of priapism and the treatment for priapism will elevated BP -I explained that we [facility], which is in [place], and the medication does have an expiration date so it is important that he schedule his appointment within that  time.   Follow Up Instructions:  He will be scheduled for a Trimix titration in the office.  A prescription of the Trimix test dose will [facility] to [facility].   I discussed the assessment and treatment plan with the patient. The patient was provided an opportunity to ask questions and all were answered. The patient agreed with the plan and demonstrated an understanding of the instructions.   The patient was advised to call back or seek an in-person evaluation if the symptoms worsen or if the condition fails to improve as anticipated.  I provided 15 minutes of non-face-to-face time during this encounter.   Brinley Rosete, PA-C

## 2020-04-07 NOTE — Progress Notes (Signed)
Incomplete Session Note  Patient Details  Name: ABIMAEL ZEITER MRN: [id number] Date of Birth: 1976-04-26 Referring Provider:     Cardiac Rehab from 11/04/2019 in [facility] Cardiac and Pulmonary Rehab  Referring Provider  Cristal Deer End      Elven Laboy [facility] did not complete his rehab session.  Ron's blood sugar was too high to exercise today and he was symptomatic with dizziness and lightheadedness. He was treated with some water and allowed to recover before sending him home.

## 2020-04-14 ENCOUNTER — Encounter: Payer: Medicare Other | Admitting: *Deleted

## 2020-04-14 ENCOUNTER — Other Ambulatory Visit: Payer: Self-pay

## 2020-04-14 DIAGNOSIS — I110 Hypertensive heart disease with heart failure: Secondary | ICD-10-CM | POA: Diagnosis not present

## 2020-04-14 DIAGNOSIS — I5022 Chronic systolic (congestive) heart failure: Secondary | ICD-10-CM

## 2020-04-14 DIAGNOSIS — Z79899 Other long term (current) drug therapy: Secondary | ICD-10-CM | POA: Diagnosis not present

## 2020-04-14 DIAGNOSIS — E119 Type 2 diabetes mellitus without complications: Secondary | ICD-10-CM | POA: Diagnosis not present

## 2020-04-14 NOTE — Progress Notes (Signed)
Daily Session Note  Patient Details  Name: [name] MRN: [id number] Date of Birth: 09-May-1976 Referring Provider:     Cardiac Rehab from 11/04/2019 in [facility] Cardiac and Pulmonary Rehab  Referring Provider  [name]      Encounter Date: 04/14/2020  Check In: Session Check In - 04/14/20 1123      Check-In   Supervising physician immediately available to respond to emergencies  See telemetry face sheet for immediately available ER MD    Location  [facility]-Cardiac & Pulmonary Rehab    Staff Present  Renita Papa, RN Vickki Hearing, BA, ACSM CEP, Exercise Physiologist;Melissa Caiola RDN, LDN    Virtual Visit  No    Medication changes reported      No    Fall or balance concerns reported     No    Warm-up and Cool-down  Performed on first and last piece of equipment    Resistance Training Performed  Yes    VAD Patient?  No    PAD/SET Patient?  No      Pain Assessment   Currently in Pain?  No/denies          Social History   Tobacco Use  Smoking Status Never Smoker  Smokeless Tobacco Never Used    Goals Met:  Independence with exercise equipment Exercise tolerated well No report of cardiac concerns or symptoms Strength training completed today  Goals Unmet:  Not Applicable  Comments: Pt able to follow exercise prescription today without complaint.  Will continue to monitor for progression.    Dr. Emily Filbert is Medical Director for [place] and [facility].

## 2020-04-19 ENCOUNTER — Encounter: Payer: Medicare Other | Attending: Internal Medicine | Admitting: *Deleted

## 2020-04-19 ENCOUNTER — Other Ambulatory Visit: Payer: Self-pay

## 2020-04-19 DIAGNOSIS — I110 Hypertensive heart disease with heart failure: Secondary | ICD-10-CM | POA: Diagnosis not present

## 2020-04-19 DIAGNOSIS — Z79899 Other long term (current) drug therapy: Secondary | ICD-10-CM | POA: Diagnosis not present

## 2020-04-19 DIAGNOSIS — I5022 Chronic systolic (congestive) heart failure: Secondary | ICD-10-CM | POA: Diagnosis not present

## 2020-04-19 DIAGNOSIS — E119 Type 2 diabetes mellitus without complications: Secondary | ICD-10-CM | POA: Insufficient documentation

## 2020-04-19 NOTE — Progress Notes (Signed)
Daily Session Note  Patient Details  Name: [name] MRN: [id number] Date of Birth: 01-12-1976 Referring Provider:     Cardiac Rehab from 11/04/2019 in [facility] Cardiac and Pulmonary Rehab  Referring Provider  [name]      Encounter Date: 04/19/2020  Check In: Session Check In - 04/19/20 1210      Check-In   Supervising physician immediately available to respond to emergencies  See telemetry face sheet for immediately available ER MD    Location  [facility]-Cardiac & Pulmonary Rehab    Staff Present  Nyoka Cowden, RN, BSN, Ardeth Sportsman RDN, Rowe Pavy, BA, ACSM CEP, Exercise Physiologist    Virtual Visit  No    Medication changes reported      No    Fall or balance concerns reported     No    Resistance Training Performed  No    VAD Patient?  No    PAD/SET Patient?  No      Pain Assessment   Currently in Pain?  No/denies          Social History   Tobacco Use  Smoking Status Never Smoker  Smokeless Tobacco Never Used    Goals Met:  Independence with exercise equipment Exercise tolerated well No report of cardiac concerns or symptoms  Goals Unmet:  Not Applicable  Comments: Pt able to follow exercise prescription today without complaint.  Will continue to monitor for progression.   Dr. Emily Filbert is Medical Director for [place] and [facility].

## 2020-04-19 NOTE — Progress Notes (Signed)
Finished charting initial evaluation from 04/14/20- Epic system was down and was unable to chart.

## 2020-04-21 ENCOUNTER — Telehealth: Payer: Self-pay | Admitting: *Deleted

## 2020-04-21 NOTE — Telephone Encounter (Signed)
Patient called to check status of Trimix injection, called [facility]-stated he was supposed to call a couple of days before to fill prescription. Will not be able to fill until afternoon, patient aware need to reschedule appointment. Will pick up RX tomorrow and keep in freezer till appointment next week. Appointment rescheduled.

## 2020-04-22 ENCOUNTER — Ambulatory Visit: Payer: Self-pay | Admitting: Urology

## 2020-04-25 DIAGNOSIS — Z794 Long term (current) use of insulin: Secondary | ICD-10-CM | POA: Diagnosis not present

## 2020-04-25 DIAGNOSIS — E1142 Type 2 diabetes mellitus with diabetic polyneuropathy: Secondary | ICD-10-CM | POA: Diagnosis not present

## 2020-04-25 DIAGNOSIS — E785 Hyperlipidemia, unspecified: Secondary | ICD-10-CM | POA: Diagnosis not present

## 2020-04-25 DIAGNOSIS — I152 Hypertension secondary to endocrine disorders: Secondary | ICD-10-CM | POA: Diagnosis not present

## 2020-04-25 DIAGNOSIS — E1159 Type 2 diabetes mellitus with other circulatory complications: Secondary | ICD-10-CM | POA: Diagnosis not present

## 2020-04-25 DIAGNOSIS — E1165 Type 2 diabetes mellitus with hyperglycemia: Secondary | ICD-10-CM | POA: Diagnosis not present

## 2020-04-25 DIAGNOSIS — E1169 Type 2 diabetes mellitus with other specified complication: Secondary | ICD-10-CM | POA: Diagnosis not present

## 2020-04-26 ENCOUNTER — Encounter: Payer: Medicare Other | Admitting: *Deleted

## 2020-04-26 ENCOUNTER — Other Ambulatory Visit: Payer: Self-pay

## 2020-04-26 DIAGNOSIS — I110 Hypertensive heart disease with heart failure: Secondary | ICD-10-CM | POA: Diagnosis not present

## 2020-04-26 DIAGNOSIS — I5022 Chronic systolic (congestive) heart failure: Secondary | ICD-10-CM

## 2020-04-26 NOTE — Progress Notes (Signed)
Daily Session Note  Patient Details  Name: [name] MRN: [id number] Date of Birth: 07-25-1976 Referring Provider:     Cardiac Rehab from 11/04/2019 in [facility] Cardiac and Pulmonary Rehab  Referring Provider  [name]      Encounter Date: 04/26/2020  Check In: Session Check In - 04/26/20 1130      Check-In   Supervising physician immediately available to respond to emergencies  See telemetry face sheet for immediately available ER MD    Location  [facility]-Cardiac & Pulmonary Rehab    Staff Present  Heath Lark, RN, BSN, CCRP;Melissa Caiola RDN, LDN;Joseph Hood RCP,RRT,BSRT    Virtual Visit  No    Medication changes reported      No    Fall or balance concerns reported     No    Warm-up and Cool-down  Performed on first and last piece of equipment    Resistance Training Performed  Yes    VAD Patient?  No    PAD/SET Patient?  No      Pain Assessment   Currently in Pain?  No/denies          Social History   Tobacco Use  Smoking Status Never Smoker  Smokeless Tobacco Never Used    Goals Met:  Independence with exercise equipment Exercise tolerated well No report of cardiac concerns or symptoms  Goals Unmet:  Not Applicable  Comments: Pt able to follow exercise prescription today without complaint.  Will continue to monitor for progression.    Dr. Emily Filbert is Medical Director for [place] and [facility].

## 2020-04-29 ENCOUNTER — Ambulatory Visit: Payer: Self-pay | Admitting: Urology

## 2020-04-29 ENCOUNTER — Other Ambulatory Visit: Payer: Self-pay

## 2020-04-29 DIAGNOSIS — I5020 Unspecified systolic (congestive) heart failure: Secondary | ICD-10-CM

## 2020-04-29 MED ORDER — ENTRESTO 24-26 MG PO TABS: 1.0000 | ORAL_TABLET | Freq: Two times a day (BID) | ORAL | 0 refills | Status: DC

## 2020-05-03 ENCOUNTER — Encounter: Payer: Self-pay | Admitting: Urology

## 2020-05-03 ENCOUNTER — Ambulatory Visit (INDEPENDENT_AMBULATORY_CARE_PROVIDER_SITE_OTHER): Payer: Medicare Other | Admitting: Urology

## 2020-05-03 ENCOUNTER — Other Ambulatory Visit: Payer: Self-pay

## 2020-05-03 VITALS — BP 126/85 | HR 89 | Ht 73.0 in | Wt 225.0 lb

## 2020-05-03 DIAGNOSIS — D573 Sickle-cell trait: Secondary | ICD-10-CM

## 2020-05-03 NOTE — Progress Notes (Addendum)
    05/03/2020 9:04 AM   [name] Nov 25, 1975 [id number]  Referring provider: Elby Beck, [place],  [place] [address]  [name] presents today for a Trimix titration.  He is no longer having spontaneous erections. He has had no response to PDE5i's. He denies  a history of multiple myeloma or a history of leukemia.  He has not taken trazodone or a PDE5i's today.   Due to the patient's sickle cell trait, we will not be performing a trimix injection. We will refer him to hematology. He also has not been evaluated in several years. We will consult with [facility] and Dr. Bernardo Heater regarding the safety of pursuing an intracravernous injection.  I informed him that this is due to the increase risk of priapism.   I, Joneen Boers Peace, am acting as a Education administrator for [employer], PA-C  I have reviewed the above documentation for accuracy and completeness, and I agree with the above.    Zara Council, PA-C

## 2020-05-04 ENCOUNTER — Encounter: Payer: Self-pay | Admitting: *Deleted

## 2020-05-04 DIAGNOSIS — I5022 Chronic systolic (congestive) heart failure: Secondary | ICD-10-CM

## 2020-05-04 NOTE — Progress Notes (Signed)
Cardiac Individual Treatment Plan  Patient Details  Name: [name] MRN: [id number] Date of Birth: Oct 17, 1976 Referring Provider:     Cardiac Rehab from 11/04/2019 in [facility] Cardiac [facility] Pulmonary Rehab  Referring Provider [name]      Initial Encounter Date:    Cardiac Rehab from 11/04/2019 in [facility] Cardiac and Pulmonary Rehab  Date 11/04/19      Visit Diagnosis: Heart failure, chronic systolic ([place])  Patient's Home Medications on Admission:  Current Outpatient Medications:  .  AMBULATORY NON FORMULARY MEDICATION, Trimix (30/1/10)-(Pap/Phent/PGE)  Test Dose  6m vial   Qty #3 Refills 0  [facility] [phone] [phone] Disp: 3 mL, Rfl: 0 .  atorvastatin (LIPITOR) 40 MG tablet, Take 1 tablet (40 mg total) by mouth daily at 6 PM., Disp: 90 tablet, Rfl: 0 .  Blood Pressure Monitoring (BLOOD PRESSURE MONITOR/L CUFF) MISC, 1 Units by Does not apply route as needed., Disp: 1 each, Rfl: 0 .  carvedilol (COREG) 6.25 MG tablet, Take 1 tablet (6.25 mg total) by mouth 2 (two) times daily., Disp: 180 tablet, Rfl: 2 .  DULoxetine (CYMBALTA) 20 MG capsule, Take 2 capsules by mouth daily., Disp: , Rfl:  .  NOVOLIN 70/30 FLEXPEN (70-30) 100 UNIT/ML PEN, Inject into the skin 2 (two) times daily. Sliding scale, Disp: , Rfl:  .  potassium chloride SA (KLOR-CON) 20 MEQ tablet, Take 1 tablet (20 mEq total) by mouth 2 (two) times daily., Disp: 60 tablet, Rfl: 2 .  rOPINIRole (REQUIP) 0.25 MG tablet, Take 1 tablet by mouth 3 (three) times daily., Disp: , Rfl:  .  sacubitril-valsartan (ENTRESTO) 24-26 MG, Take 1 tablet by mouth 2 (two) times daily. Please call to schedule office visit for further refills. Thank you!, Disp: 60 tablet, Rfl: 0 .  sildenafil (VIAGRA) 100 MG tablet, Take 1 tablet (100 mg total) by mouth daily as needed for erectile dysfunction. Take two hours prior to intercourse on an empty stomach, Disp: 30 tablet, Rfl: 2 .  torsemide (DEMADEX) 20 MG tablet, Take 1  tablet (20 mg total) by mouth daily., Disp: 90 tablet, Rfl: 3  Past Medical History: Past Medical History:  Diagnosis Date  . CHF (congestive heart failure) ([place]   . CKD (chronic kidney disease), stage III   . Diabetes mellitus   . ED (erectile dysfunction)   . Hypertension     Tobacco Use: Social History   Tobacco Use  Smoking Status Never Smoker  Smokeless Tobacco Never Used    Labs: Recent Review Flowsheet Data    Labs for ITP Cardiac and Pulmonary Rehab Latest Ref Rng & Units 04/25/2011 02/09/2012 05/26/2018 09/14/2019 09/15/2019   Cholestrol 0 - 200 mg/dL - - 177 - 118   LDLCALC 0 - 99 mg/dL - - 104(H) - 60   HDL >40 mg/dL - - 57.00 - 48   Trlycerides <150 mg/dL - - 81.0 - 49   Hemoglobin A1c 4.8 - 5.6 % - - 13.4(A) 7.4(H) 7.5(H)   TCO2 0 - 100 mmol/L 30 29 - - -       Exercise Target Goals: Exercise Program Goal: Individual exercise prescription set using results from initial 6 min walk test and THRR while considering  patient's activity barriers and safety.   Exercise Prescription Goal: Initial exercise prescription builds to 30-45 minutes a day of aerobic activity, 2-3 days per week.  Home exercise guidelines will be given to patient during program as part of exercise prescription that the participant will  acknowledge.   Education: Aerobic Exercise & Resistance Training: - Gives group verbal and written instruction on the various components of exercise. Focuses on aerobic and resistive training programs and the benefits of this training and how to safely progress through these programs..   Education: Exercise & Equipment Safety: - Individual verbal instruction and demonstration of equipment use and safety with use of the equipment.   Education: Exercise Physiology & General Exercise Guidelines: - Group verbal and written instruction with models to review the exercise physiology of the cardiovascular system and associated critical values. Provides general exercise  guidelines with specific guidelines to those with heart or lung disease.    Education: Flexibility, Balance, Mind/Body Relaxation: Provides group verbal/written instruction on the benefits of flexibility and balance training, including mind/body exercise modes such as yoga, pilates and tai chi.  Demonstration and skill practice provided.   Activity Barriers & Risk Stratification:   6 Minute Walk:   Oxygen Initial Assessment:   Oxygen Re-Evaluation:   Oxygen Discharge (Final Oxygen Re-Evaluation):   Initial Exercise Prescription:   Perform Capillary Blood Glucose checks as needed.  Exercise Prescription Changes:  Exercise Prescription Changes    Row Name 11/10/19 1400 11/27/19 1000 12/09/19 1400 12/23/19 1200 01/05/20 1300     Response to Exercise   Blood Pressure (Admit) 146/64 100/64 138/70 128/84 134/70   Blood Pressure (Exercise) -- 112/60 138/82 152/76 136/64   Blood Pressure (Exit) 1'20/70 94/60 90/60 '$ 130/70 116/60   Heart Rate (Admit) 85 bpm 87 bpm 114 bpm 78 bpm 97 bpm   Heart Rate (Exercise) 112 bpm 113 bpm 140 bpm 124 bpm 121 bpm   Heart Rate (Exit) 93 bpm 89 bpm 97 bpm 110 bpm 105 bpm   Rating of Perceived Exertion (Exercise) '14 17 17 13 13   '$ Symptoms none fatigue on elliptical -- hip pain on elliptical --   Comments 1st day -- -- -- --   Duration Progress to 30 minutes of  aerobic without signs/symptoms of physical distress Continue with 30 min of aerobic exercise without signs/symptoms of physical distress. Continue with 30 min of aerobic exercise without signs/symptoms of physical distress. Continue with 30 min of aerobic exercise without signs/symptoms of physical distress. Continue with 30 min of aerobic exercise without signs/symptoms of physical distress.   Intensity THRR unchanged THRR unchanged THRR unchanged THRR unchanged THRR unchanged     Progression   Progression Continue to progress workloads to maintain intensity without signs/symptoms of physical  distress. Continue to progress workloads to maintain intensity without signs/symptoms of physical distress. Continue to progress workloads to maintain intensity without signs/symptoms of physical distress. Continue to progress workloads to maintain intensity without signs/symptoms of physical distress. Continue to progress workloads to maintain intensity without signs/symptoms of physical distress.   Average METs 2.7 2.91 2.7 2.94 3.25     Resistance Training   Training Prescription Yes Yes Yes Yes Yes   Weight 3 lb 3 lb 3 lb 3 lb 8 lb   Reps 10-15 10-15 10-15 10-15 10-15     Interval Training   Interval Training -- No No No No     Treadmill   MPH -- 3 2.2 2.2 2.3   Grade -- 1 0 0 0   Minutes -- '15 15 15 15   '$ METs -- 3.71 2.69 2.68 2.69     Recumbant Bike   Level 1 -- -- 7 --   RPM 60 -- -- -- --   Watts 25 -- -- 40 --  Minutes 15 -- -- 15 --   METs 2.8 -- -- 3.26 --     NuStep   Level 2 -- -- 5 --   SPM 80 -- -- -- --   Minutes 15 -- -- 15 --   METs 2.1 -- -- 2.9 --     Elliptical   Level -- 1 1 -- --   Speed -- 2.5 2.5 -- --   Minutes -- 10  2-5 min bouts 10  short bouts as tolerated -- --     REL-XR   Level -- -- -- -- 4   Minutes -- -- -- -- 15   METs -- -- -- -- 3.8     T5 Nustep   Level -- 2 -- -- --   Minutes -- 15 -- -- --   METs -- 2.1 -- -- --     Home Exercise Plan   Plans to continue exercise at -- -- -- Home (comment)  walking --   Frequency -- -- -- Add 3 additional days to program exercise sessions. --   Initial Home Exercises Provided -- -- -- 12/10/19 --   [place] Name 01/20/20 1300 02/03/20 1500 02/16/20 1500 03/03/20 1300 03/15/20 1600     Response to Exercise   Blood Pressure (Admit) 122/72 128/82 124/70 110/74 110/78   Blood Pressure (Exercise) 144/80 142/82 136/64 132/68 140/78   Blood Pressure (Exit) 108/64 102/78 134/64 122/62 106/68   Heart Rate (Admit) 86 bpm 95 bpm 104 bpm 93 bpm 79 bpm   Heart Rate (Exercise) 135 bpm 127 bpm 130 bpm 116  bpm 109 bpm   Heart Rate (Exit) 103 bpm 89 bpm 107 bpm 91 bpm 88 bpm   Rating of Perceived Exertion (Exercise) '15 17 13 12 17   '$ Symptoms -- -- hip pain -- hip pain   Duration Continue with 30 min of aerobic exercise without signs/symptoms of physical distress. Continue with 30 min of aerobic exercise without signs/symptoms of physical distress. Continue with 30 min of aerobic exercise without signs/symptoms of physical distress. Continue with 30 min of aerobic exercise without signs/symptoms of physical distress. Continue with 30 min of aerobic exercise without signs/symptoms of physical distress.   Intensity THRR unchanged THRR unchanged THRR unchanged THRR unchanged THRR unchanged     Progression   Progression Continue to progress workloads to maintain intensity without signs/symptoms of physical distress. Continue to progress workloads to maintain intensity without signs/symptoms of physical distress. Continue to progress workloads to maintain intensity without signs/symptoms of physical distress. Continue to progress workloads to maintain intensity without signs/symptoms of physical distress. Continue to progress workloads to maintain intensity without signs/symptoms of physical distress.   Average METs 3.13 2.6 2.7 3.23 3.02     Resistance Training   Training Prescription Yes Yes Yes -- Yes   Weight 8 lb 8 lb 8 lb -- 8 lb   Reps 10-15 10-15 10-15 -- 10-15     Interval Training   Interval Training No No No -- No     Treadmill   MPH 2.3 1.9 2.3 -- 2.3   Grade 0 0 0 -- 0   Minutes '15 15 15 '$ -- 15   METs 2.76 -- 2.76 -- 2.76     REL-XR   Level 4 -- -- -- 5   Minutes 15 -- -- -- 15   METs 3.5 -- -- -- 3.9     T5 Nustep   Level -- 5 -- -- 5  SPM -- 80 -- -- --   Minutes -- 15 -- -- 15   METs -- 2.5 -- -- 2.4     Home Exercise Plan   Plans to continue exercise at Home (comment)  walking Home (comment)  walking Home (comment)  walking -- Home (comment)  walking   Frequency Add 3  additional days to program exercise sessions. Add 3 additional days to program exercise sessions. Add 3 additional days to program exercise sessions. -- Add 3 additional days to program exercise sessions.   Initial Home Exercises Provided 12/10/19 12/10/19 12/10/19 -- 12/10/19   Row Name 03/28/20 1700 04/12/20 1600 04/28/20 1300         Response to Exercise   Blood Pressure (Admit) 110/64 138/80 118/62     Blood Pressure (Exercise) 124/72 130/88 130/82     Blood Pressure (Exit) 108/60 126/54 122/80     Heart Rate (Admit) 67 bpm 89 bpm 82 bpm     Heart Rate (Exercise) 113 bpm 147 bpm 116 bpm     Heart Rate (Exit) -- 93 bpm 96 bpm     Rating of Perceived Exertion (Exercise) '14 15 13     '$ Symptoms -- none none     Duration Continue with 30 min of aerobic exercise without signs/symptoms of physical distress. Continue with 30 min of aerobic exercise without signs/symptoms of physical distress. Continue with 30 min of aerobic exercise without signs/symptoms of physical distress.     Intensity THRR unchanged THRR unchanged THRR unchanged       Progression   Progression Continue to progress workloads to maintain intensity without signs/symptoms of physical distress. Continue to progress workloads to maintain intensity without signs/symptoms of physical distress. Continue to progress workloads to maintain intensity without signs/symptoms of physical distress.     Average METs 3.83 2.67 3.15       Resistance Training   Training Prescription Yes Yes Yes     Weight 8 lb 8 lb 8 lb     Reps 10-15 10-15 10-15       Interval Training   Interval Training No No No       Treadmill   MPH 2.4 2.4 2.4     Grade 0 0 0     Minutes '15 15 15     '$ METs 2.76 2.84 2.84       REL-XR   Level 5 -- 5     Minutes 15 -- 15     METs 4.9 -- 4       T5 Nustep   Level -- 5 3     Minutes -- 15 15     METs -- 2.5 2.6       Home Exercise Plan   Plans to continue exercise at -- Home (comment)  walking Home  (comment)  walking     Frequency -- Add 3 additional days to program exercise sessions. Add 3 additional days to program exercise sessions.     Initial Home Exercises Provided -- 12/10/19 12/10/19            Exercise Comments:  Exercise Comments    Row Name 11/09/19 6948 11/26/19 1049 01/19/20 1025 01/19/20 1118     Exercise Comments First full day of exercise!  Patient was oriented to gym and equipment including functions, settings, policies, and procedures.  Patient's individual exercise prescription and treatment plan were reviewed.  All starting workloads were established based on the results of the 6 minute walk test done  at initial orientation visit.  The plan for exercise progression was also introduced and progression will be customized based on patient's performance and goals. Rons sciatica has been bothering him.  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  Staff showed Ron stretches (modified figure 4 )  and recommended PT consult in the meantime. Aman is experiencing left shoulder discomfort.  Thinks he pulled a muscle. Movement of shoulder creates discomfort.  Advised to call MD if this continues or gets worse. Also advised not to use left arm to hard today. He verbalized understanding. Zeek stated he did fine today, no further discomfort while exercising.           Exercise Goals and Review:   Exercise Goals Re-Evaluation :  Exercise Goals Re-Evaluation    Row Name 11/09/19 7673 11/27/19 1035 12/09/19 1457 12/10/19 1041 12/23/19 1211     Exercise Goal Re-Evaluation   Exercise Goals Review Able to understand and use rate of perceived exertion (RPE) scale;Able to understand and use Dyspnea scale;Able to check pulse independently;Understanding of Exercise Prescription Increase Physical Activity;Increase Strength and Stamina;Understanding of Exercise Prescription Increase Physical Activity;Increase Strength and Stamina;Able to  understand and use rate of perceived exertion (RPE) scale;Able to understand and use Dyspnea scale;Able to check pulse independently;Knowledge and understanding of Target Heart Rate Range (THRR);Understanding of Exercise Prescription Increase Physical Activity;Increase Strength and Stamina;Understanding of Exercise Prescription Increase Physical Activity;Increase Strength and Stamina;Understanding of Exercise Prescription   Comments Reviewed RPE scale, THR and program prescription with pt today.  Pt voiced understanding and was given a copy of goals to take home. Glenard is off to a good start in rehab. He tried the treadmill and elliptical yesterday.  He did well on the treadmill, but the elliptical was challenging.  He also complained of some sciattica and hip pain. 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  We talked with him about stretching and strengthening his hip to help avoid surgery.  We also recommended a new PT consult to help as well.  He was open to all options.  We will continue to monitor his hip and progress. Ron finds the elliptical very challenging.  He is doing short bouts on the elliptical Ron is doing well in rehab.  He has not been doing much at home.  He usually stretches and walks around block for about 10 min. Reviewed home exercise with pt today.  Pt plans to walk and stretch at home for exercise.  Reviewed THR, pulse, RPE, sign and symptoms, NTG use, and when to call 911 or MD.  Also discussed weather considerations and indoor options.  Pt voiced understanding. Ron has been doing well in rehab.  He was able to do well on the bike and we have moved him off the elliptical as it really aggravated his hip.  We will continue to montior his progress.   Expected Outcomes Short: Use RPE daily to regulate intensity. Long: Follow program prescription in THR. Short: Try stretching and strengthen to support hip.  Long: Continue to follow program prescription and  attend regularly Short - build endurance on elliptical Long:  improve overall stamina Short: Increase walking time at home. Long: Continue to improve stamina. Short: Increase workload on treadmill.  Long: Continue to add in exercise at home.   [place] Name 12/29/19 1031 01/05/20 1308 01/20/20 1344 02/03/20 1552 02/16/20 1525  Exercise Goal Re-Evaluation   Exercise Goals Review Increase Physical Activity;Increase Strength and Stamina;Understanding of Exercise Prescription Increase Physical Activity;Increase Strength and Stamina;Able to understand and use rate of perceived exertion (RPE) scale;Able to understand and use Dyspnea scale;Knowledge and understanding of Target Heart Rate Range (THRR);Able to check pulse independently;Understanding of Exercise Prescription Increase Physical Activity;Increase Strength and Stamina;Understanding of Exercise Prescription Increase Physical Activity;Increase Strength and Stamina;Able to understand and use rate of perceived exertion (RPE) scale;Able to understand and use Dyspnea scale;Knowledge and understanding of Target Heart Rate Range (THRR);Able to check pulse independently;Understanding of Exercise Prescription Increase Physical Activity;Increase Strength and Stamina;Understanding of Exercise Prescription   Comments Weighted sit-ups 5 on each arm - will do 20 minutes 3x/week. Does laps around the store and other unstructured exercise. Pt reports walking on road hurts his feet and he is fatigued and his body is hurting (leg and back). Ron has increased to 8 ob weights fro strength work and lincreased TM speed.  He is exercsising some at home.  Staff will monitor progress. Ron continues to do well in rehab.  Yesterday we limited his arms as he had been having some shoulder pain that seemed muscular in nature.  He is up to level 4 on the XR.  We will continue to monitor his progress. Ron has only attended twice in March.  Consistent exercise will yield more progress. Ron has  only attended once since last review (3 total for March).  He is able to come in to complete exercise but lacks progression due to lack of consistent attendance.  We will continue to monitor his progress.   Expected Outcomes Short: Increase workload on treadmill.  Long: Continue to add in exercise at home. -- Short: Add incline to treadmill if hip able to tolerate.  Long: Continue to improve stamina. Short : attend/exercise consistently Long: improve overall MET level Short: Attend regularly  Long: Continue to improve stamina.   [place] Name 02/23/20 1024 03/03/20 1325 03/15/20 1605 03/28/20 1706 03/29/20 1121     Exercise Goal Re-Evaluation   Exercise Goals Review Increase Physical Activity;Increase Strength and Stamina;Understanding of Exercise Prescription Increase Physical Activity;Increase Strength and Stamina;Able to understand and use rate of perceived exertion (RPE) scale;Able to understand and use Dyspnea scale;Knowledge and understanding of Target Heart Rate Range (THRR);Able to check pulse independently;Understanding of Exercise Prescription Increase Physical Activity;Increase Strength and Stamina;Understanding of Exercise Prescription Increase Physical Activity;Increase Strength and Stamina;Able to understand and use rate of perceived exertion (RPE) scale;Able to understand and use Dyspnea scale;Knowledge and understanding of Target Heart Rate Range (THRR);Able to check pulse independently;Understanding of Exercise Prescription Increase Physical Activity;Increase Strength and Stamina;Understanding of Exercise Prescription   Comments 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 Ron has not had regular attendance.  He is active outside program sessions Ron continues to have poor attendance and only attended twice since last review.  He would greatly benefit from improved attendance.  He was able to use the T5 NuStep today for the full  30 min.  We will continue to monitor his progress Ron has changed to a different time slot that should allow him to attend more consistently. Ron is doing well in rehab.  He is walking at home.  He has also a star roller now that he is using on his hip. He has also gotten a TENs unit to use.  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   He  is also going to use it on his carpel tunnel.  Overall, he is feeling stronger and has more stamina.   Expected Outcomes Short: Attend regularly  Long: Continue to improve stamina. Short : attend regulalry Long: increase overall MET level Short: Improved attendance  Long; Continue to improve stamina Short: attend regularly Long: increase stamina Short: Try out TENs unit on hip  Long: Conintue to improve stamina.   [place] Name 04/12/20 1614 04/28/20 1326           Exercise Goal Re-Evaluation   Exercise Goals Review Increase Physical Activity;Increase Strength and Stamina;Understanding of Exercise Prescription Increase Physical Activity;Increase Strength and Stamina;Understanding of Exercise Prescription      Comments Ron has been doing well in rehab. His attendance has improved some, but could still be better.  His progression would be better with improved attendance.  He is up to level 5 on the T5 NuStep.  We will continue to monitor his progress. Ron continues to well in rehab.  He missed today's visit.  He is doing well with the XR and is up to level 5.  We will continue to monitor his progression.      Expected Outcomes Short: Improve attendance Long: Continue to improve stamina. Short: Continue to attend regularly  Long: Continue to improve strength and stamina.             Discharge Exercise Prescription (Final Exercise Prescription Changes):  Exercise Prescription Changes - 04/28/20 1300      Response to Exercise   Blood Pressure (Admit) 118/62    Blood Pressure (Exercise) 130/82    Blood Pressure (Exit) 122/80    Heart Rate (Admit) 82 bpm     Heart Rate (Exercise) 116 bpm    Heart Rate (Exit) 96 bpm    Rating of Perceived Exertion (Exercise) 13    Symptoms none    Duration Continue with 30 min of aerobic exercise without signs/symptoms of physical distress.    Intensity THRR unchanged      Progression   Progression Continue to progress workloads to maintain intensity without signs/symptoms of physical distress.    Average METs 3.15      Resistance Training   Training Prescription Yes    Weight 8 lb    Reps 10-15      Interval Training   Interval Training No      Treadmill   MPH 2.4    Grade 0    Minutes 15    METs 2.84      REL-XR   Level 5    Minutes 15    METs 4      T5 Nustep   Level 3    Minutes 15    METs 2.6      Home Exercise Plan   Plans to continue exercise at Home (comment)   walking   Frequency Add 3 additional days to program exercise sessions.    Initial Home Exercises Provided 12/10/19           Nutrition:  Target Goals: Understanding of nutrition guidelines, daily intake of sodium '1500mg'$ , cholesterol '200mg'$ , calories 30% from fat and 7% or less from saturated fats, daily to have 5 or more servings of fruits and vegetables.  Education: Controlling Sodium/Reading Food Labels -Group verbal and written material supporting the discussion of sodium use in heart healthy nutrition. Review and explanation with models, verbal and written materials for utilization of the food label.   Education: General Nutrition Guidelines/Fats and Fiber: -Group  instruction provided by verbal, written material, models and posters to present the general guidelines for heart healthy nutrition. Gives an explanation and review of dietary fats and fiber.   Biometrics:    Nutrition Therapy Plan and Nutrition Goals:  Nutrition Therapy & Goals - 04/19/20 0719      Nutrition Therapy   Diet Low Na, heart healthy, diabetes friendly    Protein (specify units) 85g    Fiber 30 grams    Whole Grain Foods 3 servings     Saturated Fats 12 max. grams    Fruits and Vegetables 5 servings/day    Sodium 1.5 grams      Personal Nutrition Goals   Nutrition Goal ST: do not eat"naked" carbohydrates, eat whole foods instead of smoothies LT: lower BG    Comments Pt reports his BG is typicalyy aroung 250 and he got as high as 450. Pt reports he is being monitored for medication management. Pt eats watermelon, smoothie, oatmeal, or pancakes for breakfast. Discussed diabetes friendly eating extensively. Emphasized eating protein, fiber, and fat with meals to slow BG absorption.  Based on BG and diet believe his high blood sugar is not diet related, however, adjusting his diet might help manage it better. He needs medication management to prevent such high BG - pt is on insulin.      Intervention Plan   Intervention Prescribe, educate and counsel regarding individualized specific dietary modifications aiming towards targeted core components such as weight, hypertension, lipid management, diabetes, heart failure and other comorbidities.;Nutrition handout(s) given to patient.    Expected Outcomes Short Term Goal: Understand basic principles of dietary content, such as calories, fat, sodium, cholesterol and nutrients.;Short Term Goal: A plan has been developed with personal nutrition goals set during dietitian appointment.;Long Term Goal: Adherence to prescribed nutrition plan.           Nutrition Assessments:   MEDIFICTS Score Key:          ?70 Need to make dietary changes          40-70 Heart Healthy Diet         ? 40 Therapeutic Level Cholesterol Diet  Nutrition Goals Re-Evaluation:  Nutrition Goals Re-Evaluation    [place] Name 03/29/20 1135 04/19/20 1145           Goals   Nutrition Goal Find healthy non-perishable snacks --      Comment Short: Start stocking snacks in car  Long: Continue to work towards [employer] Ron does keep snacks in his car - peanut butter heavy trail mix.      Expected Outcome Short:  Start stocking snacks in car  Long: Continue to work towards healthy diet --             Nutrition Goals Discharge (Final Nutrition Goals Re-Evaluation):  Nutrition Goals Re-Evaluation - 04/19/20 1145      Goals   Comment Ron does keep snacks in his car - peanut butter heavy trail mix.           Psychosocial: Target Goals: Acknowledge presence or absence of significant depression and/or stress, maximize coping skills, provide positive support system. Participant is able to verbalize types and ability to use techniques and skills needed for reducing stress and depression.   Education: Depression - Provides group verbal and written instruction on the correlation between heart/lung disease and depressed mood, treatment options, and the stigmas associated with seeking treatment.   Education: Sleep Hygiene -Provides group verbal and written instruction about how  sleep can affect your health.  Define sleep hygiene, discuss sleep cycles and impact of sleep habits. Review good sleep hygiene tips.     Education: Stress and Anxiety: - Provides group verbal and written instruction about the health risks of elevated stress and causes of high stress.  Discuss the correlation between heart/lung disease and anxiety and treatment options. Review healthy ways to manage with stress and anxiety.    Initial Review & Psychosocial Screening:   Quality of Life Scores:   Scores of 19 and below usually indicate a poorer quality of life in these areas.  A difference of  2-3 points is a clinically meaningful difference.  A difference of 2-3 points in the total score of the Quality of Life Index has been associated with significant improvement in overall quality of life, self-image, physical symptoms, and general health in studies assessing change in quality of life.  PHQ-9: Recent Review Flowsheet Data    Depression screen [facility] 2/9 03/24/2020 11/24/2019 05/26/2018 04/26/2014   Decreased Interest 0 0 0 0    Down, Depressed, Hopeless 0 0 0 0   PHQ - 2 Score 0 0 0 0   Altered sleeping 0 - - -   Tired, decreased energy 0 - - -   Change in appetite 0 - - -   Feeling bad or failure about yourself  0 - - -   Trouble concentrating 0 - - -   Moving slowly or fidgety/restless 0 - - -   Suicidal thoughts 0 - - -   PHQ-9 Score 0 - - -   Difficult doing work/chores Not difficult at all - - -     Interpretation of Total Score  Total Score Depression Severity:  1-4 = Minimal depression, 5-9 = Mild depression, 10-14 = Moderate depression, 15-19 = Moderately severe depression, 20-27 = Severe depression   Psychosocial Evaluation and Intervention:   Psychosocial Re-Evaluation:  Psychosocial Re-Evaluation    Row Name 12/10/19 1044 12/29/19 1035 02/23/20 1029 03/29/20 1127 04/19/20 1143     Psychosocial Re-Evaluation   Current issues with Current Stress Concerns Current Stress Concerns Current Stress Concerns Current Stress Concerns Current Stress Concerns   Comments Ron is doing well in rehab.  His hips are his biggest limitations for exercise and mobility.  His health is his biggest stressor.  He gets at least 6 hours of sleep a night. His hips are his biggest limitations for exercise and mobility.  His health is his biggest stressor, but his health is not a "mental stressor". Pt reports wife is a good support system and that she will cook for him.  He gets at least 6 hours of sleep a night, body pain sometimes makes it hard to sleep; will get on average 8-9 hours of sleep with 2-3 interuptions from pain or bathroom. Pt reports stress levels are fine and he is doing well with his job and at home. Pt reports hvaing a good support system. Pt reports still sleeping well. Pt reports having pain all the time; neuropathy and herniated disc and pinched nerve and shoulder pain. Pt reports doctor wants him to get surgery, but with evewrthing else has not done it. Ron is doing well in rehab.  He has started to get some  tools to use at home to help with pain levels.  He is doing well mentally.  He continues to sleep well for the most part.  His pain in hip and shoulder can interrupt his sleep some. Ron  states he doesnt have stress.  He sleeps well most of the time.  He does have back pain that can only be resolved with surgery.   Expected Outcomes Short: Continue to attend to build hip strength.  Long: COntinue to cope positively Short: Continue to attend to build hip strength.  Long: Continue to cope positively Short: Continue to attend to build hip strength.  Long: Continue to cope positively Short: Continue to attend to build hip strength.  Long: Continue to cope positively Short: Continue to attend to build hip strength.  Long: Continue to cope positively   Interventions Encouraged to attend Cardiac Rehabilitation for the exercise Encouraged to attend Cardiac Rehabilitation for the exercise Encouraged to attend Cardiac Rehabilitation for the exercise Encouraged to attend Cardiac Rehabilitation for the exercise Encouraged to attend Cardiac Rehabilitation for the exercise   Continue Psychosocial Services  Follow up required by staff Follow up required by staff Follow up required by staff -- --          Psychosocial Discharge (Final Psychosocial Re-Evaluation):  Psychosocial Re-Evaluation - 04/19/20 1143      Psychosocial Re-Evaluation   Current issues with Current Stress Concerns    Comments Ron states he doesnt have stress.  He sleeps well most of the time.  He does have back pain that can only be resolved with surgery.    Expected Outcomes Short: Continue to attend to build hip strength.  Long: Continue to cope positively    Interventions Encouraged to attend Cardiac Rehabilitation for the exercise           Vocational Rehabilitation: Provide vocational rehab assistance to qualifying candidates.   Vocational Rehab Evaluation & Intervention:   Education: Education Goals: Education classes will be  provided on a variety of topics geared toward better understanding of heart health and risk factor modification. Participant will state understanding/return demonstration of topics presented as noted by education test scores.  Learning Barriers/Preferences:   General Cardiac Education Topics:  AED/CPR: - Group verbal and written instruction with the use of models to demonstrate the basic use of the AED with the basic ABC's of resuscitation.   Anatomy & Physiology of the Heart: - Group verbal and written instruction and models provide basic cardiac anatomy and physiology, with the coronary electrical and arterial systems. Review of Valvular disease and Heart Failure   Cardiac Procedures: - Group verbal and written instruction to review commonly prescribed medications for heart disease. Reviews the medication, class of the drug, and side effects. Includes the steps to properly store meds and maintain the prescription regimen. (beta blockers and nitrates)   Cardiac Medications I: - Group verbal and written instruction to review commonly prescribed medications for heart disease. Reviews the medication, class of the drug, and side effects. Includes the steps to properly store meds and maintain the prescription regimen.   Cardiac Medications II: -Group verbal and written instruction to review commonly prescribed medications for heart disease. Reviews the medication, class of the drug, and side effects. (all other drug classes)    Go Sex-Intimacy & Heart Disease, Get SMART - Goal Setting: - Group verbal and written instruction through game format to discuss heart disease and the return to sexual intimacy. Provides group verbal and written material to discuss and apply goal setting through the application of the S.M.A.R.T. Method.   Other Matters of the Heart: - Provides group verbal, written materials and models to describe Stable Angina and Peripheral Artery. Includes description of the  disease process and  treatment options available to the cardiac patient.   Infection Prevention: - Provides verbal and written material to individual with discussion of infection control including proper hand washing and proper equipment cleaning during exercise session.   Falls Prevention: - Provides verbal and written material to individual with discussion of falls prevention and safety.   Other: -Provides group and verbal instruction on various topics (see comments)   Knowledge Questionnaire Score:   Core Components/Risk Factors/Patient Goals at Admission:   Education:Diabetes - Individual verbal and written instruction to review signs/symptoms of diabetes, desired ranges of glucose level fasting, after meals and with exercise. Acknowledge that pre and post exercise glucose checks will be done for 3 sessions at entry of program.   Education: Know Your Numbers and Risk Factors: -Group verbal and written instruction about important numbers in your health.  Discussion of what are risk factors and how they play a role in the disease process.  Review of Cholesterol, Blood Pressure, Diabetes, and BMI and the role they play in your overall health.   Core Components/Risk Factors/Patient Goals Review:   Goals and Risk Factor Review    Row Name 12/10/19 1046 12/29/19 1039 02/23/20 1026 03/29/20 1128 04/19/20 1140     Core Components/Risk Factors/Patient Goals Review   Personal Goals Review Weight Management/Obesity;Hypertension;Heart Failure;Diabetes Weight Management/Obesity;Hypertension;Heart Failure;Diabetes Weight Management/Obesity;Hypertension;Heart Failure;Diabetes Weight Management/Obesity;Hypertension;Heart Failure;Diabetes --   Review Ron is doing well.  His weight has been steady.  However he is starting to notice changes in his body composition.  His clothes are starting to fit differently.  Blood sugars have been up and down but are starting to regulate more.  He is also  starting to watch his soduim levels.  He is just having some chest pressure but no other heart failure symptoms. Pt reports that his weight is stable but his clothes are now much looser and may need new clothes soon. He reports his BG is eratic; 300 yesterday and last night is was 99, bottomed out overnight and drank some juice, this morning it was 94. He reports eating consistently. On insulin, he says his MD is going to change meds to help with this. He is still feeling chest pressure and light headed if he gets up too quickly, but no other HF symptoms. Pt has reported increased weight, does not feel like it is muscle mass. Pt reports still on insulin, blood sugar varyies 300 -->70 without doing anything differently. not feeling lightheaded when stands anymore. Ron is doing well in rehab.  His weight has been creeping up again.  His lowest in past few years was 198 lb and he wants to get back to 215-220 lb.  He has gotten back to eating out again.  We talked about trying to plan ahead some and then looking for snacks that are non-perishable.  Ron is still watching his blood sugars but his wife broke his needle.  He is going to get a new one.  He is still having some chest pressure from the heart failure but still has not talked to doctor about it still going on.  We talked about sending doc a message.  He doing well with his pressures and still checks them at home. Ron did get a new needle for checking BG.  His BG has been high at times - he plans to call today.  He reports taking all meds as directed.  He did talk to his Dr about chest pressure - Dr had him take meds at specific  times.  He sees Dr this Friday for follow up.   Expected Outcomes Short: Continue to work on weight.  Long: Continue to monitor risk factors. Short: Continue to work on strength Long: Continue to monitor risk factors. Short: Continue to work on strength Long: Continue to monitor risk factors. Short: Send doctor note about chest pain and  get new needle for diabetes check.  Long: Continue to manage risk factors. Short: foolow up with Dr Laverta Baltimore: manage risk factors          Core Components/Risk Factors/Patient Goals at Discharge (Final Review):   Goals and Risk Factor Review - 04/19/20 1140      Core Components/Risk Factors/Patient Goals Review   Review Ron did get a new needle for checking BG.  His BG has been high at times - he plans to call today.  He reports taking all meds as directed.  He did talk to his Dr about chest pressure - Dr had him take meds at specific times.  He sees Dr this Friday for follow up.    Expected Outcomes Short: foolow up with Dr Laverta Baltimore: manage risk factors           ITP Comments:  ITP Comments    [place] Name 11/09/19 [phone] 11/18/19 0851 12/16/19 1228 01/13/20 0629 01/19/20 1022   ITP Comments First full day of exercise!  Patient was oriented to gym and equipment including functions, settings, policies, and procedures.  Patient's individual exercise prescription and treatment plan were reviewed.  All starting workloads were established based on the results of the 6 minute walk test done at initial orientation visit.  The plan for exercise progression was also introduced and progression will be customized based on patient's performance and goals. 30 day review competed . ITP sent to Dr Emily Filbert for review, changes as needed and ITP approval signature  New to program 30 day review completed. ITP sent to Dr. Emily Filbert, Medical Director of Cardiac and Pulmonary Rehab. Continue with ITP unless changes are made by physician.  Department operating under reduced schedule until further notice by request from hospital leadership. 30 day chart review completed. ITP sent to Dr Zachery Dakins Medical Director, for review,changes as needed and signature. Caroll is experiencing left shoulder discomfort.  Thinks he pulled a muscle. Movement of shoulder creates discomfort.  Advised to call MD if this continues or gets  worse. Also advised not to use left arm to hard today. He verbalized understanding.   [place] Name 01/19/20 1117 02/10/20 9509 02/16/20 1524 03/09/20 0608 03/17/20 1429   ITP Comments Jori Moll stated he did fine today, no further discomfort while exercising. 30 day chart review completed. ITP sent to Dr Zachery Dakins Medical Director, for review,changes as needed and signature. Continue with ITP if no changes requested Ron's attendance has been spotted, only attended 3 times this month for various reasons. 30 Day review completed. Medical Director review done, changes made as directed,and approval shown by signature of Market researcher. Called to check on Ron.  Left message about lack of attendance and request for improved attendance to maintain spot or look into possible virtual program.  Sent letter.   [place] Name 04/06/20 0614 04/07/20 1312 05/04/20 0601       ITP Comments 30 Day review completed. ITP review done, changes made as directed,and approval shown by signature of  Scientist, research (life sciences). [employer] [facility] did not complete his rehab session.  Ron's blood sugar was too high to exercise today and he  was symptomatic with dizziness and lightheadedness. He was treated with some water and allowed to recover before sending him home. 30 Day review completed. Medical Director ITP review done, changes made as directed, and signed approval by Medical Director.            Comments: 30 Day review completed. Medical Director ITP review done, changes made as directed, and signed approval by Medical Director.

## 2020-05-05 ENCOUNTER — Encounter: Payer: Medicare Other | Admitting: *Deleted

## 2020-05-05 ENCOUNTER — Other Ambulatory Visit: Payer: Self-pay

## 2020-05-05 ENCOUNTER — Other Ambulatory Visit: Payer: Self-pay | Admitting: Cardiology

## 2020-05-05 DIAGNOSIS — I110 Hypertensive heart disease with heart failure: Secondary | ICD-10-CM | POA: Diagnosis not present

## 2020-05-05 DIAGNOSIS — I5022 Chronic systolic (congestive) heart failure: Secondary | ICD-10-CM

## 2020-05-05 MED ORDER — TORSEMIDE 20 MG PO TABS: 20.0000 mg | ORAL_TABLET | Freq: Every day | ORAL | 3 refills | Status: DC

## 2020-05-05 NOTE — Progress Notes (Signed)
Daily Session Note  Patient Details  Name: KAIVEN VESTER MRN: [id number] Date of Birth: [birth year] Referring Provider:     Cardiac Rehab from 11/04/2019 in [facility] Cardiac and Pulmonary Rehab  Referring Provider [name]      Encounter Date: 05/05/2020  Check In:  Session Check In - 05/05/20 1113      Check-In   Supervising physician immediately available to respond to emergencies See telemetry face sheet for immediately available ER MD    Location [facility]-Cardiac & Pulmonary Rehab    Staff Present Renita Papa, RN BSN;Melissa Caiola RDN, Rowe Pavy, BA, ACSM CEP, Exercise Physiologist;Kara Eliezer Bottom, MS Exercise Physiologist    Virtual Visit No    Medication changes reported     No    Fall or balance concerns reported    No    Warm-up and Cool-down Performed on first and last piece of equipment    Resistance Training Performed Yes    VAD Patient? No    PAD/SET Patient? No      Pain Assessment   Currently in Pain? No/denies              Social History   Tobacco Use  Smoking Status Never Smoker  Smokeless Tobacco Never Used    Goals Met:  Independence with exercise equipment Exercise tolerated well No report of cardiac concerns or symptoms Strength training completed today  Goals Unmet:  Not Applicable  Comments: Pt able to follow exercise prescription today without complaint.  Will continue to monitor for progression.    Dr. Emily Filbert is Medical Director for [place] and [facility].

## 2020-05-05 NOTE — Telephone Encounter (Signed)
*  STAT* If patient is at the pharmacy, call can be transferred to refill team.   1. Which medications need to be refilled? (please list name of each medication and dose if known)    Ropiniprole 0.25 mg po TID Torsemide 20 mg po q d    2. Which pharmacy/location (including street and city if local pharmacy) is medication to be sent to?  [facility] [address]   3. Do they need a 30 day or 90 day supply? 90

## 2020-05-10 ENCOUNTER — Other Ambulatory Visit: Payer: Self-pay

## 2020-05-10 ENCOUNTER — Encounter: Payer: Medicare Other | Admitting: *Deleted

## 2020-05-10 DIAGNOSIS — I110 Hypertensive heart disease with heart failure: Secondary | ICD-10-CM | POA: Diagnosis not present

## 2020-05-10 DIAGNOSIS — I5022 Chronic systolic (congestive) heart failure: Secondary | ICD-10-CM

## 2020-05-10 NOTE — Progress Notes (Signed)
Daily Session Note  Patient Details  Name: [name] MRN: [id number] Date of Birth: 04-Aug-1976 Referring Provider:     Cardiac Rehab from 11/04/2019 in [facility] Cardiac and Pulmonary Rehab  Referring Provider [name]      Encounter Date: 05/10/2020  Check In:  Session Check In - 05/10/20 1207      Check-In   Supervising physician immediately available to respond to emergencies See telemetry face sheet for immediately available ER MD    Location [facility]-Cardiac & Pulmonary Rehab    Staff Present Heath Lark, RN, BSN, CCRP;Joseph Hood RCP,RRT,BSRT;Amanda Gu-Win, [place], ACSM CEP, Exercise Physiologist    Virtual Visit No    Medication changes reported     No    Fall or balance concerns reported    No    Warm-up and Cool-down Performed on first and last piece of equipment    Resistance Training Performed Yes    VAD Patient? No    PAD/SET Patient? No      Pain Assessment   Currently in Pain? No/denies              Social History   Tobacco Use  Smoking Status Never Smoker  Smokeless Tobacco Never Used    Goals Met:  Independence with exercise equipment Exercise tolerated well No report of cardiac concerns or symptoms  Goals Unmet:  Not Applicable  Comments: Pt able to follow exercise prescription today without complaint.  Will continue to monitor for progression.    Dr. Emily Filbert is Medical Director for [place] and [facility].

## 2020-05-12 ENCOUNTER — Telehealth: Payer: Self-pay | Admitting: Cardiology

## 2020-05-12 ENCOUNTER — Other Ambulatory Visit: Payer: Self-pay

## 2020-05-12 ENCOUNTER — Encounter: Payer: Medicare Other | Admitting: *Deleted

## 2020-05-12 DIAGNOSIS — I110 Hypertensive heart disease with heart failure: Secondary | ICD-10-CM | POA: Diagnosis not present

## 2020-05-12 DIAGNOSIS — I5022 Chronic systolic (congestive) heart failure: Secondary | ICD-10-CM

## 2020-05-12 NOTE — Telephone Encounter (Signed)
Spoke with patient briefly. We were on the phone for barely a minute and the phone became fuzzy and disconnected. I attempted to call patient back a few times but it would go to VM.  [facility] pharmacy. Prescription was last filled in April and not due until July 1. Pharmacy staff asked if patient had lost the prescription. I am unsure as I was not able to get patient back onto the phone. Patient would be able to get 7 tablets for around $4.00 to get him to July 1st.  Attempted to reach patient again. No answer. Left message with the above information, ok per DPR, and to call us back if any further questions or concerns.

## 2020-05-12 NOTE — Progress Notes (Signed)
Daily Session Note  Patient Details  Name: [name] MRN: [id number] Date of Birth: [birth year] Referring Provider:     Cardiac Rehab from 11/04/2019 in [facility] Cardiac and Pulmonary Rehab  Referring Provider [name]      Encounter Date: 05/12/2020  Check In:  Session Check In - 05/12/20 1128      Check-In   Supervising physician immediately available to respond to emergencies See telemetry face sheet for immediately available ER MD    Location [facility]-Cardiac & Pulmonary Rehab    Staff Present Renita Papa, RN BSN;Joseph [address] [place], [place], [place], CCRP, CCET    Virtual Visit No    Medication changes reported     No    Fall or balance concerns reported    No    Warm-up and Cool-down Performed on first and last piece of equipment    Resistance Training Performed Yes    VAD Patient? No    PAD/SET Patient? No      Pain Assessment   Currently in Pain? No/denies              Social History   Tobacco Use  Smoking Status Never Smoker  Smokeless Tobacco Never Used    Goals Met:  Independence with exercise equipment Exercise tolerated well No report of cardiac concerns or symptoms Strength training completed today  Goals Unmet:  Not Applicable  Comments: Pt able to follow exercise prescription today without complaint.  Will continue to monitor for progression.    Dr. Emily Filbert is Medical Director for [place] and [facility].

## 2020-05-12 NOTE — Telephone Encounter (Signed)
Patient calling in stating that he is out of torsemide and that [facility] is saying insurance will not cover his medication until 05/19/20. Patient wanting to know if there is anything we can do  Please advise

## 2020-05-24 ENCOUNTER — Encounter: Payer: Medicare Other | Attending: Internal Medicine

## 2020-05-24 DIAGNOSIS — Z79899 Other long term (current) drug therapy: Secondary | ICD-10-CM | POA: Insufficient documentation

## 2020-05-24 DIAGNOSIS — I5022 Chronic systolic (congestive) heart failure: Secondary | ICD-10-CM | POA: Insufficient documentation

## 2020-05-24 DIAGNOSIS — E119 Type 2 diabetes mellitus without complications: Secondary | ICD-10-CM | POA: Insufficient documentation

## 2020-05-24 DIAGNOSIS — I110 Hypertensive heart disease with heart failure: Secondary | ICD-10-CM | POA: Insufficient documentation

## 2020-05-26 ENCOUNTER — Telehealth: Payer: Self-pay

## 2020-05-26 NOTE — Telephone Encounter (Signed)
LMOM

## 2020-05-31 ENCOUNTER — Ambulatory Visit (INDEPENDENT_AMBULATORY_CARE_PROVIDER_SITE_OTHER): Payer: Medicare Other

## 2020-05-31 ENCOUNTER — Other Ambulatory Visit: Payer: Self-pay

## 2020-05-31 DIAGNOSIS — I428 Other cardiomyopathies: Secondary | ICD-10-CM | POA: Diagnosis not present

## 2020-06-01 ENCOUNTER — Encounter: Payer: Self-pay | Admitting: *Deleted

## 2020-06-01 DIAGNOSIS — I5022 Chronic systolic (congestive) heart failure: Secondary | ICD-10-CM

## 2020-06-01 NOTE — Progress Notes (Signed)
Cardiac Individual Treatment Plan  Patient Details  Name: [name] MRN: [id number] Date of Birth: 03-06-1976 Referring Provider:     Cardiac Rehab from 11/04/2019 in [facility] Cardiac and Pulmonary Rehab  Referring Provider [name]      Initial Encounter Date:    Cardiac Rehab from 11/04/2019 in [facility] Cardiac and Pulmonary Rehab  Date 11/04/19      Visit Diagnosis: Heart failure, chronic systolic ([place])  Patient's Home Medications on Admission:  Current Outpatient Medications:  .  AMBULATORY NON FORMULARY MEDICATION, Trimix (30/1/10)-(Pap/Phent/PGE)  Test Dose  55m vial   Qty #3 Refills 0  [facility] [phone] [phone] Disp: 3 mL, Rfl: 0 .  atorvastatin (LIPITOR) 40 MG tablet, Take 1 tablet (40 mg total) by mouth daily at 6 PM., Disp: 90 tablet, Rfl: 0 .  Blood Pressure Monitoring (BLOOD PRESSURE MONITOR/L CUFF) MISC, 1 Units by Does not apply route as needed., Disp: 1 each, Rfl: 0 .  carvedilol (COREG) 6.25 MG tablet, Take 1 tablet (6.25 mg total) by mouth 2 (two) times daily., Disp: 180 tablet, Rfl: 2 .  DULoxetine (CYMBALTA) 20 MG capsule, Take 2 capsules by mouth daily., Disp: , Rfl:  .  NOVOLIN 70/30 FLEXPEN (70-30) 100 UNIT/ML PEN, Inject into the skin 2 (two) times daily. Sliding scale, Disp: , Rfl:  .  potassium chloride SA (KLOR-CON) 20 MEQ tablet, Take 1 tablet (20 mEq total) by mouth 2 (two) times daily., Disp: 60 tablet, Rfl: 2 .  rOPINIRole (REQUIP) 0.25 MG tablet, Take 1 tablet by mouth 3 (three) times daily., Disp: , Rfl:  .  sacubitril-valsartan (ENTRESTO) 24-26 MG, Take 1 tablet by mouth 2 (two) times daily. Please call to schedule office visit for further refills. Thank you!, Disp: 60 tablet, Rfl: 0 .  sildenafil (VIAGRA) 100 MG tablet, Take 1 tablet (100 mg total) by mouth daily as needed for erectile dysfunction. Take two hours prior to intercourse on an empty stomach, Disp: 30 tablet, Rfl: 2 .  torsemide (DEMADEX) 20 MG tablet, Take 1  tablet (20 mg total) by mouth daily., Disp: 90 tablet, Rfl: 3  Past Medical History: Past Medical History:  Diagnosis Date  . CHF (congestive heart failure) ([place]   . CKD (chronic kidney disease), stage III   . Diabetes mellitus   . ED (erectile dysfunction)   . Hypertension     Tobacco Use: Social History   Tobacco Use  Smoking Status Never Smoker  Smokeless Tobacco Never Used    Labs: Recent Review Flowsheet Data    Labs for ITP Cardiac and Pulmonary Rehab Latest Ref Rng & Units 04/25/2011 02/09/2012 05/26/2018 09/14/2019 09/15/2019   Cholestrol 0 - 200 mg/dL - - 177 - 118   LDLCALC 0 - 99 mg/dL - - 104(H) - 60   HDL >40 mg/dL - - 57.00 - 48   Trlycerides <150 mg/dL - - 81.0 - 49   Hemoglobin A1c 4.8 - 5.6 % - - 13.4(A) 7.4(H) 7.5(H)   TCO2 0 - 100 mmol/L 30 29 - - -       Exercise Target Goals: Exercise Program Goal: Individual exercise prescription set using results from initial 6 min walk test and THRR while considering  patient's activity barriers and safety.   Exercise Prescription Goal: Initial exercise prescription builds to 30-45 minutes a day of aerobic activity, 2-3 days per week.  Home exercise guidelines will be given to patient during program as part of exercise prescription that the participant will  acknowledge.   Education: Aerobic Exercise & Resistance Training: - Gives group verbal and written instruction on the various components of exercise. Focuses on aerobic and resistive training programs and the benefits of this training and how to safely progress through these programs..   Education: Exercise & Equipment Safety: - Individual verbal instruction and demonstration of equipment use and safety with use of the equipment.   Education: Exercise Physiology & General Exercise Guidelines: - Group verbal and written instruction with models to review the exercise physiology of the cardiovascular system and associated critical values. Provides general exercise  guidelines with specific guidelines to those with heart or lung disease.    Education: Flexibility, Balance, Mind/Body Relaxation: Provides group verbal/written instruction on the benefits of flexibility and balance training, including mind/body exercise modes such as yoga, pilates and tai chi.  Demonstration and skill practice provided.   Activity Barriers & Risk Stratification:   6 Minute Walk:   Oxygen Initial Assessment:   Oxygen Re-Evaluation:   Oxygen Discharge (Final Oxygen Re-Evaluation):   Initial Exercise Prescription:   Perform Capillary Blood Glucose checks as needed.  Exercise Prescription Changes:  Exercise Prescription Changes    Row Name 12/09/19 1400 12/23/19 1200 01/05/20 1300 01/20/20 1300 02/03/20 1500     Response to Exercise   Blood Pressure (Admit) 138/70 128/84 134/70 122/72 128/82   Blood Pressure (Exercise) 138/82 152/76 136/64 144/80 142/82   Blood Pressure (Exit) 90/60 130/70 116/60 108/64 102/78   Heart Rate (Admit) 114 bpm 78 bpm 97 bpm 86 bpm 95 bpm   Heart Rate (Exercise) 140 bpm 124 bpm 121 bpm 135 bpm 127 bpm   Heart Rate (Exit) 97 bpm 110 bpm 105 bpm 103 bpm 89 bpm   Rating of Perceived Exertion (Exercise) '17 13 13 15 17   '$ Symptoms - hip pain on elliptical - - -   Duration Continue with 30 min of aerobic exercise without signs/symptoms of physical distress. Continue with 30 min of aerobic exercise without signs/symptoms of physical distress. Continue with 30 min of aerobic exercise without signs/symptoms of physical distress. Continue with 30 min of aerobic exercise without signs/symptoms of physical distress. Continue with 30 min of aerobic exercise without signs/symptoms of physical distress.   Intensity THRR unchanged THRR unchanged THRR unchanged THRR unchanged THRR unchanged     Progression   Progression Continue to progress workloads to maintain intensity without signs/symptoms of physical distress. Continue to progress workloads to  maintain intensity without signs/symptoms of physical distress. Continue to progress workloads to maintain intensity without signs/symptoms of physical distress. Continue to progress workloads to maintain intensity without signs/symptoms of physical distress. Continue to progress workloads to maintain intensity without signs/symptoms of physical distress.   Average METs 2.7 2.94 3.25 3.13 2.6     Resistance Training   Training Prescription Yes Yes Yes Yes Yes   Weight 3 lb 3 lb 8 lb 8 lb 8 lb   Reps 10-15 10-15 10-15 10-15 10-15     Interval Training   Interval Training No No No No No     Treadmill   MPH 2.2 2.2 2.3 2.3 1.9   Grade 0 0 0 0 0   Minutes '15 15 15 15 15   '$ METs 2.69 2.68 2.69 2.76 -     Recumbant Bike   Level - 7 - - -   Watts - 40 - - -   Minutes - 15 - - -   METs - 3.26 - - -  NuStep   Level - 5 - - -   Minutes - 15 - - -   METs - 2.9 - - -     Elliptical   Level 1 - - - -   Speed 2.5 - - - -   Minutes 10  short bouts as tolerated - - - -     REL-XR   Level - - 4 4 -   Minutes - - 15 15 -   METs - - 3.8 3.5 -     T5 Nustep   Level - - - - 5   SPM - - - - 80   Minutes - - - - 15   METs - - - - 2.5     Home Exercise Plan   Plans to continue exercise at - Home (comment)  walking - Home (comment)  walking Home (comment)  walking   Frequency - Add 3 additional days to program exercise sessions. - Add 3 additional days to program exercise sessions. Add 3 additional days to program exercise sessions.   Initial Home Exercises Provided - 12/10/19 - 12/10/19 12/10/19   Row Name 02/16/20 1500 03/03/20 1300 03/15/20 1600 03/28/20 1700 04/12/20 1600     Response to Exercise   Blood Pressure (Admit) 124/70 110/74 110/78 110/64 138/80   Blood Pressure (Exercise) 136/64 132/68 140/78 124/72 130/88   Blood Pressure (Exit) 134/64 122/62 106/68 108/60 126/54   Heart Rate (Admit) 104 bpm 93 bpm 79 bpm 67 bpm 89 bpm   Heart Rate (Exercise) 130 bpm 116 bpm 109 bpm  113 bpm 147 bpm   Heart Rate (Exit) 107 bpm 91 bpm 88 bpm - 93 bpm   Rating of Perceived Exertion (Exercise) '13 12 17 14 15   '$ Symptoms hip pain - hip pain - none   Duration Continue with 30 min of aerobic exercise without signs/symptoms of physical distress. Continue with 30 min of aerobic exercise without signs/symptoms of physical distress. Continue with 30 min of aerobic exercise without signs/symptoms of physical distress. Continue with 30 min of aerobic exercise without signs/symptoms of physical distress. Continue with 30 min of aerobic exercise without signs/symptoms of physical distress.   Intensity THRR unchanged THRR unchanged THRR unchanged THRR unchanged THRR unchanged     Progression   Progression Continue to progress workloads to maintain intensity without signs/symptoms of physical distress. Continue to progress workloads to maintain intensity without signs/symptoms of physical distress. Continue to progress workloads to maintain intensity without signs/symptoms of physical distress. Continue to progress workloads to maintain intensity without signs/symptoms of physical distress. Continue to progress workloads to maintain intensity without signs/symptoms of physical distress.   Average METs 2.7 3.23 3.02 3.83 2.67     Resistance Training   Training Prescription Yes - Yes Yes Yes   Weight 8 lb - 8 lb 8 lb 8 lb   Reps 10-15 - 10-15 10-15 10-15     Interval Training   Interval Training No - No No No     Treadmill   MPH 2.3 - 2.3 2.4 2.4   Grade 0 - 0 0 0   Minutes 15 - '15 15 15   '$ METs 2.76 - 2.76 2.76 2.84     REL-XR   Level - - 5 5 -   Minutes - - 15 15 -   METs - - 3.9 4.9 -     T5 Nustep   Level - - 5 - 5   Minutes - -  15 - 15   METs - - 2.4 - 2.5     Home Exercise Plan   Plans to continue exercise at Home (comment)  walking - Home (comment)  walking - Home (comment)  walking   Frequency Add 3 additional days to program exercise sessions. - Add 3 additional days to  program exercise sessions. - Add 3 additional days to program exercise sessions.   Initial Home Exercises Provided 12/10/19 - 12/10/19 - 12/10/19   Row Name 04/28/20 1300 05/09/20 1400           Response to Exercise   Blood Pressure (Admit) 118/62 120/74      Blood Pressure (Exercise) 130/82 134/74      Blood Pressure (Exit) 122/80 120/84      Heart Rate (Admit) 82 bpm 93 bpm      Heart Rate (Exercise) 116 bpm 126 bpm      Heart Rate (Exit) 96 bpm 90 bpm      Rating of Perceived Exertion (Exercise) 13 16      Symptoms none none      Duration Continue with 30 min of aerobic exercise without signs/symptoms of physical distress. Continue with 30 min of aerobic exercise without signs/symptoms of physical distress.      Intensity THRR unchanged THRR unchanged        Progression   Progression Continue to progress workloads to maintain intensity without signs/symptoms of physical distress. Continue to progress workloads to maintain intensity without signs/symptoms of physical distress.      Average METs 3.15 4.33        Resistance Training   Training Prescription Yes Yes      Weight 8 lb 8 lb      Reps 10-15 10-15        Interval Training   Interval Training No Yes      Equipment - Treadmill      Comments - 1 min with grade increase to 4%        Treadmill   MPH 2.4 2.4      Grade 0 4      Minutes 15 15      METs 2.84 4.13        REL-XR   Level 5 5      Minutes 15 15      METs 4 4.5        T5 Nustep   Level 3 -      Minutes 15 -      METs 2.6 -        Home Exercise Plan   Plans to continue exercise at Home (comment)  walking Home (comment)  walking      Frequency Add 3 additional days to program exercise sessions. Add 3 additional days to program exercise sessions.      Initial Home Exercises Provided 12/10/19 12/10/19             Exercise Comments:  Exercise Comments    Row Name 01/19/20 1025 01/19/20 1118         Exercise Comments Mizraim is experiencing left  shoulder discomfort.  Thinks he pulled a muscle. Movement of shoulder creates discomfort.  Advised to call MD if this continues or gets worse. Also advised not to use left arm to hard today. He verbalized understanding. Shahin stated he did fine today, no further discomfort while exercising.             Exercise Goals and Review:   Exercise  Goals Re-Evaluation :  Exercise Goals Re-Evaluation    Row Name 12/09/19 1457 12/10/19 1041 12/23/19 1211 12/29/19 1031 01/05/20 1308     Exercise Goal Re-Evaluation   Exercise Goals Review Increase Physical Activity;Increase Strength and Stamina;Able to understand and use rate of perceived exertion (RPE) scale;Able to understand and use Dyspnea scale;Able to check pulse independently;Knowledge and understanding of Target Heart Rate Range (THRR);Understanding of Exercise Prescription Increase Physical Activity;Increase Strength and Stamina;Understanding of Exercise Prescription Increase Physical Activity;Increase Strength and Stamina;Understanding of Exercise Prescription Increase Physical Activity;Increase Strength and Stamina;Understanding of Exercise Prescription Increase Physical Activity;Increase Strength and Stamina;Able to understand and use rate of perceived exertion (RPE) scale;Able to understand and use Dyspnea scale;Knowledge and understanding of Target Heart Rate Range (THRR);Able to check pulse independently;Understanding of Exercise Prescription   Comments Ron finds the elliptical very challenging.  He is doing short bouts on the elliptical Ron is doing well in rehab.  He has not been doing much at home.  He usually stretches and walks around block for about 10 min. Reviewed home exercise with pt today.  Pt plans to walk and stretch at home for exercise.  Reviewed THR, pulse, RPE, sign and symptoms, NTG use, and when to call 911 or MD.  Also discussed weather considerations and indoor options.  Pt voiced understanding. Ron has been doing well in  rehab.  He was able to do well on the bike and we have moved him off the elliptical as it really aggravated his hip.  We will continue to montior his progress. Weighted sit-ups 5 on each arm - will do 20 minutes 3x/week. Does laps around the store and other unstructured exercise. Pt reports walking on road hurts his feet and he is fatigued and his body is hurting (leg and back). Ron has increased to 8 ob weights fro strength work and lincreased TM speed.  He is exercsising some at home.  Staff will monitor progress.   Expected Outcomes Short - build endurance on elliptical Long:  improve overall stamina Short: Increase walking time at home. Long: Continue to improve stamina. Short: Increase workload on treadmill.  Long: Continue to add in exercise at home. Short: Increase workload on treadmill.  Long: Continue to add in exercise at home. -   Row Name 01/20/20 1344 02/03/20 1552 02/16/20 1525 02/23/20 1024 03/03/20 1325     Exercise Goal Re-Evaluation   Exercise Goals Review Increase Physical Activity;Increase Strength and Stamina;Understanding of Exercise Prescription Increase Physical Activity;Increase Strength and Stamina;Able to understand and use rate of perceived exertion (RPE) scale;Able to understand and use Dyspnea scale;Knowledge and understanding of Target Heart Rate Range (THRR);Able to check pulse independently;Understanding of Exercise Prescription Increase Physical Activity;Increase Strength and Stamina;Understanding of Exercise Prescription Increase Physical Activity;Increase Strength and Stamina;Understanding of Exercise Prescription Increase Physical Activity;Increase Strength and Stamina;Able to understand and use rate of perceived exertion (RPE) scale;Able to understand and use Dyspnea scale;Knowledge and understanding of Target Heart Rate Range (THRR);Able to check pulse independently;Understanding of Exercise Prescription   Comments Ron continues to do well in rehab.  Yesterday we limited  his arms as he had been having some shoulder pain that seemed muscular in nature.  He is up to level 4 on the XR.  We will continue to monitor his progress. Ron has only attended twice in March.  Consistent exercise will yield more progress. Ron has only attended once since last review (3 total for March).  He is able to come in to complete exercise but  lacks progression due to lack of consistent attendance.  We will continue to monitor his progress. 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 Ron has not had regular attendance.  He is active outside program sessions   Expected Outcomes Short: Add incline to treadmill if hip able to tolerate.  Long: Continue to improve stamina. Short : attend/exercise consistently Long: improve overall MET level Short: Attend regularly  Long: Continue to improve stamina. Short: Attend regularly  Long: Continue to improve stamina. Short : attend regulalry Long: increase overall MET level   Row Name 03/15/20 1605 03/28/20 1706 03/29/20 1121 04/12/20 1614 04/28/20 1326     Exercise Goal Re-Evaluation   Exercise Goals Review Increase Physical Activity;Increase Strength and Stamina;Understanding of Exercise Prescription Increase Physical Activity;Increase Strength and Stamina;Able to understand and use rate of perceived exertion (RPE) scale;Able to understand and use Dyspnea scale;Knowledge and understanding of Target Heart Rate Range (THRR);Able to check pulse independently;Understanding of Exercise Prescription Increase Physical Activity;Increase Strength and Stamina;Understanding of Exercise Prescription Increase Physical Activity;Increase Strength and Stamina;Understanding of Exercise Prescription Increase Physical Activity;Increase Strength and Stamina;Understanding of Exercise Prescription   Comments Ron continues to have poor attendance and only attended twice since last review.  He would greatly benefit  from improved attendance.  He was able to use the T5 NuStep today for the full 30 min.  We will continue to monitor his progress Ron has changed to a different time slot that should allow him to attend more consistently. Ron is doing well in rehab.  He is walking at home.  He has also a star roller now that he is using on his hip. He has also gotten a TENs unit to use.  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  Overall, he is feeling stronger and has more stamina. Ron has been doing well in rehab. His attendance has improved some, but could still be better.  His progression would be better with improved attendance.  He is up to level 5 on the T5 NuStep.  We will continue to monitor his progress. Ron continues to well in rehab.  He missed today's visit.  He is doing well with the XR and is up to level 5.  We will continue to monitor his progression.   Expected Outcomes Short: Improved attendance  Long; Continue to improve stamina Short: attend regularly Long: increase stamina Short: Try out TENs unit on hip  Long: Conintue to improve stamina. Short: Improve attendance Long: Continue to improve stamina. Short: Continue to attend regularly  Long: Continue to improve strength and stamina.   [place] Name 05/09/20 1452             Exercise Goal Re-Evaluation   Exercise Goals Review Increase Physical Activity;Increase Strength and Stamina;Understanding of Exercise Prescription       Comments Ron has been doing well in rehab.  He is now up to doing 4% grade on treadmill with intervals.  We will continue to monitor his progress.       Expected Outcomes Short: Continue to use intervals Long; Continue to improve stamina.              Discharge Exercise Prescription (Final Exercise Prescription Changes):  Exercise Prescription Changes - 05/09/20 1400      Response to Exercise   Blood Pressure (Admit) 120/74    Blood Pressure (Exercise)  134/74     Blood Pressure (Exit) 120/84    Heart Rate (Admit) 93 bpm    Heart Rate (Exercise) 126 bpm    Heart Rate (Exit) 90 bpm    Rating of Perceived Exertion (Exercise) 16    Symptoms none    Duration Continue with 30 min of aerobic exercise without signs/symptoms of physical distress.    Intensity THRR unchanged      Progression   Progression Continue to progress workloads to maintain intensity without signs/symptoms of physical distress.    Average METs 4.33      Resistance Training   Training Prescription Yes    Weight 8 lb    Reps 10-15      Interval Training   Interval Training Yes    Equipment Treadmill    Comments 1 min with grade increase to 4%      Treadmill   MPH 2.4    Grade 4    Minutes 15    METs 4.13      REL-XR   Level 5    Minutes 15    METs 4.5      Home Exercise Plan   Plans to continue exercise at Home (comment)   walking   Frequency Add 3 additional days to program exercise sessions.    Initial Home Exercises Provided 12/10/19           Nutrition:  Target Goals: Understanding of nutrition guidelines, daily intake of sodium '1500mg'$ , cholesterol '200mg'$ , calories 30% from fat and 7% or less from saturated fats, daily to have 5 or more servings of fruits and vegetables.  Education: Controlling Sodium/Reading Food Labels -Group verbal and written material supporting the discussion of sodium use in heart healthy nutrition. Review and explanation with models, verbal and written materials for utilization of the food label.   Education: General Nutrition Guidelines/Fats and Fiber: -Group instruction provided by verbal, written material, models and posters to present the general guidelines for heart healthy nutrition. Gives an explanation and review of dietary fats and fiber.   Biometrics:    Nutrition Therapy Plan and Nutrition Goals:  Nutrition Therapy & Goals - 04/19/20 0719      Nutrition Therapy   Diet Low Na, heart healthy, diabetes friendly     Protein (specify units) 85g    Fiber 30 grams    Whole Grain Foods 3 servings    Saturated Fats 12 max. grams    Fruits and Vegetables 5 servings/day    Sodium 1.5 grams      Personal Nutrition Goals   Nutrition Goal ST: do not eat"naked" carbohydrates, eat whole foods instead of smoothies LT: lower BG    Comments Pt reports his BG is typicalyy aroung 250 and he got as high as 450. Pt reports he is being monitored for medication management. Pt eats watermelon, smoothie, oatmeal, or pancakes for breakfast. Discussed diabetes friendly eating extensively. Emphasized eating protein, fiber, and fat with meals to slow BG absorption.  Based on BG and diet believe his high blood sugar is not diet related, however, adjusting his diet might help manage it better. He needs medication management to prevent such high BG - pt is on insulin.      Intervention Plan   Intervention Prescribe, educate and counsel regarding individualized specific dietary modifications aiming towards targeted core components such as weight, hypertension, lipid management, diabetes, heart failure and other comorbidities.;Nutrition handout(s) given to patient.    Expected Outcomes Short Term Goal: Understand basic principles  of dietary content, such as calories, fat, sodium, cholesterol and nutrients.;Short Term Goal: A plan has been developed with personal nutrition goals set during dietitian appointment.;Long Term Goal: Adherence to prescribed nutrition plan.           Nutrition Assessments:   MEDIFICTS Score Key:          ?70 Need to make dietary changes          40-70 Heart Healthy Diet         ? 40 Therapeutic Level Cholesterol Diet  Nutrition Goals Re-Evaluation:  Nutrition Goals Re-Evaluation    Row Name 03/29/20 1135 04/19/20 1145           Goals   Nutrition Goal Find healthy non-perishable snacks -      Comment Short: Start stocking snacks in car  Long: Continue to work towards [employer] Ron does keep  snacks in his car - peanut butter heavy trail mix.      Expected Outcome Short: Start stocking snacks in car  Long: Continue to work towards healthy diet -             Nutrition Goals Discharge (Final Nutrition Goals Re-Evaluation):  Nutrition Goals Re-Evaluation - 04/19/20 1145      Goals   Comment Ron does keep snacks in his car - peanut butter heavy trail mix.           Psychosocial: Target Goals: Acknowledge presence or absence of significant depression and/or stress, maximize coping skills, provide positive support system. Participant is able to verbalize types and ability to use techniques and skills needed for reducing stress and depression.   Education: Depression - Provides group verbal and written instruction on the correlation between heart/lung disease and depressed mood, treatment options, and the stigmas associated with seeking treatment.   Education: Sleep Hygiene -Provides group verbal and written instruction about how sleep can affect your health.  Define sleep hygiene, discuss sleep cycles and impact of sleep habits. Review good sleep hygiene tips.     Education: Stress and Anxiety: - Provides group verbal and written instruction about the health risks of elevated stress and causes of high stress.  Discuss the correlation between heart/lung disease and anxiety and treatment options. Review healthy ways to manage with stress and anxiety.    Initial Review & Psychosocial Screening:   Quality of Life Scores:   Scores of 19 and below usually indicate a poorer quality of life in these areas.  A difference of  2-3 points is a clinically meaningful difference.  A difference of 2-3 points in the total score of the Quality of Life Index has been associated with significant improvement in overall quality of life, self-image, physical symptoms, and general health in studies assessing change in quality of life.  PHQ-9: Recent Review Flowsheet Data    Depression screen  [facility] 2/9 03/24/2020 11/24/2019 05/26/2018 04/26/2014   Decreased Interest 0 0 0 0   Down, Depressed, Hopeless 0 0 0 0   PHQ - 2 Score 0 0 0 0   Altered sleeping 0 - - -   Tired, decreased energy 0 - - -   Change in appetite 0 - - -   Feeling bad or failure about yourself  0 - - -   Trouble concentrating 0 - - -   Moving slowly or fidgety/restless 0 - - -   Suicidal thoughts 0 - - -   PHQ-9 Score 0 - - -   Difficult doing work/chores  Not difficult at all - - -     Interpretation of Total Score  Total Score Depression Severity:  1-4 = Minimal depression, 5-9 = Mild depression, 10-14 = Moderate depression, 15-19 = Moderately severe depression, 20-27 = Severe depression   Psychosocial Evaluation and Intervention:   Psychosocial Re-Evaluation:  Psychosocial Re-Evaluation    [place] Name 12/10/19 1044 12/29/19 1035 02/23/20 1029 03/29/20 1127 04/19/20 1143     Psychosocial Re-Evaluation   Current issues with Current Stress Concerns Current Stress Concerns Current Stress Concerns Current Stress Concerns Current Stress Concerns   Comments Ron is doing well in rehab.  His hips are his biggest limitations for exercise and mobility.  His health is his biggest stressor.  He gets at least 6 hours of sleep a night. His hips are his biggest limitations for exercise and mobility.  His health is his biggest stressor, but his health is not a "mental stressor". Pt reports wife is a good support system and that she will cook for him.  He gets at least 6 hours of sleep a night, body pain sometimes makes it hard to sleep; will get on average 8-9 hours of sleep with 2-3 interuptions from pain or bathroom. Pt reports stress levels are fine and he is doing well with his job and at home. Pt reports hvaing a good support system. Pt reports still sleeping well. Pt reports having pain all the time; neuropathy and herniated disc and pinched nerve and shoulder pain. Pt reports doctor wants him to get surgery, but with evewrthing  else has not done it. Ron is doing well in rehab.  He has started to get some tools to use at home to help with pain levels.  He is doing well mentally.  He continues to sleep well for the most part.  His pain in hip and shoulder can interrupt his sleep some. Ron states he doesnt have stress.  He sleeps well most of the time.  He does have back pain that can only be resolved with surgery.   Expected Outcomes Short: Continue to attend to build hip strength.  Long: COntinue to cope positively Short: Continue to attend to build hip strength.  Long: Continue to cope positively Short: Continue to attend to build hip strength.  Long: Continue to cope positively Short: Continue to attend to build hip strength.  Long: Continue to cope positively Short: Continue to attend to build hip strength.  Long: Continue to cope positively   Interventions Encouraged to attend Cardiac Rehabilitation for the exercise Encouraged to attend Cardiac Rehabilitation for the exercise Encouraged to attend Cardiac Rehabilitation for the exercise Encouraged to attend Cardiac Rehabilitation for the exercise Encouraged to attend Cardiac Rehabilitation for the exercise   Continue Psychosocial Services  Follow up required by staff Follow up required by staff Follow up required by staff - -          Psychosocial Discharge (Final Psychosocial Re-Evaluation):  Psychosocial Re-Evaluation - 04/19/20 1143      Psychosocial Re-Evaluation   Current issues with Current Stress Concerns    Comments Ron states he doesnt have stress.  He sleeps well most of the time.  He does have back pain that can only be resolved with surgery.    Expected Outcomes Short: Continue to attend to build hip strength.  Long: Continue to cope positively    Interventions Encouraged to attend Cardiac Rehabilitation for the exercise           Vocational Rehabilitation: Provide vocational  rehab assistance to qualifying candidates.   Vocational Rehab Evaluation &  Intervention:   Education: Education Goals: Education classes will be provided on a variety of topics geared toward better understanding of heart health and risk factor modification. Participant will state understanding/return demonstration of topics presented as noted by education test scores.  Learning Barriers/Preferences:   General Cardiac Education Topics:  AED/CPR: - Group verbal and written instruction with the use of models to demonstrate the basic use of the AED with the basic ABC's of resuscitation.   Anatomy & Physiology of the Heart: - Group verbal and written instruction and models provide basic cardiac anatomy and physiology, with the coronary electrical and arterial systems. Review of Valvular disease and Heart Failure   Cardiac Procedures: - Group verbal and written instruction to review commonly prescribed medications for heart disease. Reviews the medication, class of the drug, and side effects. Includes the steps to properly store meds and maintain the prescription regimen. (beta blockers and nitrates)   Cardiac Medications I: - Group verbal and written instruction to review commonly prescribed medications for heart disease. Reviews the medication, class of the drug, and side effects. Includes the steps to properly store meds and maintain the prescription regimen.   Cardiac Medications II: -Group verbal and written instruction to review commonly prescribed medications for heart disease. Reviews the medication, class of the drug, and side effects. (all other drug classes)    Go Sex-Intimacy & Heart Disease, Get SMART - Goal Setting: - Group verbal and written instruction through game format to discuss heart disease and the return to sexual intimacy. Provides group verbal and written material to discuss and apply goal setting through the application of the S.M.A.R.T. Method.   Other Matters of the Heart: - Provides group verbal, written materials and models to  describe Stable Angina and Peripheral Artery. Includes description of the disease process and treatment options available to the cardiac patient.   Infection Prevention: - Provides verbal and written material to individual with discussion of infection control including proper hand washing and proper equipment cleaning during exercise session.   Falls Prevention: - Provides verbal and written material to individual with discussion of falls prevention and safety.   Other: -Provides group and verbal instruction on various topics (see comments)   Knowledge Questionnaire Score:   Core Components/Risk Factors/Patient Goals at Admission:   Education:Diabetes - Individual verbal and written instruction to review signs/symptoms of diabetes, desired ranges of glucose level fasting, after meals and with exercise. Acknowledge that pre and post exercise glucose checks will be done for 3 sessions at entry of program.   Education: Know Your Numbers and Risk Factors: -Group verbal and written instruction about important numbers in your health.  Discussion of what are risk factors and how they play a role in the disease process.  Review of Cholesterol, Blood Pressure, Diabetes, and BMI and the role they play in your overall health.   Core Components/Risk Factors/Patient Goals Review:   Goals and Risk Factor Review    Row Name 12/10/19 1046 12/29/19 1039 02/23/20 1026 03/29/20 1128 04/19/20 1140     Core Components/Risk Factors/Patient Goals Review   Personal Goals Review Weight Management/Obesity;Hypertension;Heart Failure;Diabetes Weight Management/Obesity;Hypertension;Heart Failure;Diabetes Weight Management/Obesity;Hypertension;Heart Failure;Diabetes Weight Management/Obesity;Hypertension;Heart Failure;Diabetes -   Review Ron is doing well.  His weight has been steady.  However he is starting to notice changes in his body composition.  His clothes are starting to fit differently.  Blood sugars  have been up and down but  are starting to regulate more.  He is also starting to watch his soduim levels.  He is just having some chest pressure but no other heart failure symptoms. Pt reports that his weight is stable but his clothes are now much looser and may need new clothes soon. He reports his BG is eratic; 300 yesterday and last night is was 99, bottomed out overnight and drank some juice, this morning it was 94. He reports eating consistently. On insulin, he says his MD is going to change meds to help with this. He is still feeling chest pressure and light headed if he gets up too quickly, but no other HF symptoms. Pt has reported increased weight, does not feel like it is muscle mass. Pt reports still on insulin, blood sugar varyies 300 -->70 without doing anything differently. not feeling lightheaded when stands anymore. Ron is doing well in rehab.  His weight has been creeping up again.  His lowest in past few years was 198 lb and he wants to get back to 215-220 lb.  He has gotten back to eating out again.  We talked about trying to plan ahead some and then looking for snacks that are non-perishable.  Ron is still watching his blood sugars but his wife broke his needle.  He is going to get a new one.  He is still having some chest pressure from the heart failure but still has not talked to doctor about it still going on.  We talked about sending doc a message.  He doing well with his pressures and still checks them at home. Ron did get a new needle for checking BG.  His BG has been high at times - he plans to call today.  He reports taking all meds as directed.  He did talk to his Dr about chest pressure - Dr had him take meds at specific times.  He sees Dr this Friday for follow up.   Expected Outcomes Short: Continue to work on weight.  Long: Continue to monitor risk factors. Short: Continue to work on strength Long: Continue to monitor risk factors. Short: Continue to work on strength Long: Continue  to monitor risk factors. Short: Send doctor note about chest pain and get new needle for diabetes check.  Long: Continue to manage risk factors. Short: foolow up with Dr Laverta Baltimore: manage risk factors          Core Components/Risk Factors/Patient Goals at Discharge (Final Review):   Goals and Risk Factor Review - 04/19/20 1140      Core Components/Risk Factors/Patient Goals Review   Review Ron did get a new needle for checking BG.  His BG has been high at times - he plans to call today.  He reports taking all meds as directed.  He did talk to his Dr about chest pressure - Dr had him take meds at specific times.  He sees Dr this Friday for follow up.    Expected Outcomes Short: foolow up with Dr Laverta Baltimore: manage risk factors           ITP Comments:  ITP Comments    Row Name 12/16/19 1228 01/13/20 0629 01/19/20 1022 01/19/20 1117 02/10/20 0633   ITP Comments 30 day review completed. ITP sent to Dr. Emily Filbert, Medical Director of Cardiac and Pulmonary Rehab. Continue with ITP unless changes are made by physician.  Department operating under reduced schedule until further notice by request from hospital leadership. 30 day chart review completed. ITP sent to  Dr Zachery Dakins Medical Director, for review,changes as needed and signature. Numa is experiencing left shoulder discomfort.  Thinks he pulled a muscle. Movement of shoulder creates discomfort.  Advised to call MD if this continues or gets worse. Also advised not to use left arm to hard today. He verbalized understanding. Truitt stated he did fine today, no further discomfort while exercising. 30 day chart review completed. ITP sent to Dr Zachery Dakins Medical Director, for review,changes as needed and signature. Continue with ITP if no changes requested   Row Name 02/16/20 1524 03/09/20 0608 03/17/20 1429 04/06/20 0614 04/07/20 1312   ITP Comments Ron's attendance has been spotted, only attended 3 times this month for various reasons. 30 Day  review completed. Medical Director review done, changes made as directed,and approval shown by signature of Market researcher. Called to check on Ron.  Left message about lack of attendance and request for improved attendance to maintain spot or look into possible virtual program.  Sent letter. 30 Day review completed. ITP review done, changes made as directed,and approval shown by signature of  Scientist, research (life sciences). [employer] [facility] did not complete his rehab session.  Ron's blood sugar was too high to exercise today and he was symptomatic with dizziness and lightheadedness. He was treated with some water and allowed to recover before sending him home.   [place] Name 05/04/20 0601 05/25/20 1545 06/01/20 0650       ITP Comments 30 Day review completed. Medical Director ITP review done, changes made as directed, and signed approval by Medical Director. Ron has not attended since last review. 30 Day review completed. Medical Director ITP review done, changes made as directed, and signed approval by Medical Director.            Comments:

## 2020-06-02 ENCOUNTER — Telehealth: Payer: Self-pay | Admitting: Urology

## 2020-06-02 DIAGNOSIS — Z794 Long term (current) use of insulin: Secondary | ICD-10-CM | POA: Diagnosis not present

## 2020-06-02 DIAGNOSIS — E1165 Type 2 diabetes mellitus with hyperglycemia: Secondary | ICD-10-CM | POA: Diagnosis not present

## 2020-06-02 NOTE — Telephone Encounter (Signed)
LMOM to return call.

## 2020-06-02 NOTE — Telephone Encounter (Signed)
Please let [name] know that we have researched the issue of the sickle cell trait and Trimix and we can proceed with the injections.  He will need to be scheduled first thing in the morning Tuesday, Wednesday or Thursday.

## 2020-06-06 ENCOUNTER — Other Ambulatory Visit: Payer: Self-pay

## 2020-06-06 ENCOUNTER — Encounter: Payer: Self-pay | Admitting: Cardiology

## 2020-06-06 ENCOUNTER — Ambulatory Visit (INDEPENDENT_AMBULATORY_CARE_PROVIDER_SITE_OTHER): Payer: Medicare Other | Admitting: Cardiology

## 2020-06-06 VITALS — BP 102/80 | HR 89 | Ht 73.5 in | Wt 233.2 lb

## 2020-06-06 DIAGNOSIS — I428 Other cardiomyopathies: Secondary | ICD-10-CM | POA: Diagnosis not present

## 2020-06-06 DIAGNOSIS — I10 Essential (primary) hypertension: Secondary | ICD-10-CM

## 2020-06-06 MED ORDER — TORSEMIDE 20 MG PO TABS: 20.0000 mg | ORAL_TABLET | ORAL | 6 refills | Status: DC

## 2020-06-06 NOTE — Progress Notes (Signed)
Cardiology Office Note:    Date:  06/06/2020   ID:  [name], DOB [birth year], MRN [id number]  PCP:  Emi Belfast, FNP  Cardiologist:  Debbe Odea, MD  Electrophysiologist:  None   Referring MD: Emi Belfast, FNP   Chief Complaint  Patient presents with  . other    Follow up Echo. Meds reviewed by the pt. verbally. Pt. c/o feeling sore all over due to his back after walking a lot this weekend.     History of Present Illness:    [name] is a 44 y.o. male with a hx of hypertension, diabetes, hyperlipidemia, NICM prior EF <20 who presents for follow-up.  Patient being seen due to history of nonischemic cardiomyopathy.  Medications are being uptitrated as tolerated.  Echocardiogram was repeated to evaluate ejection fraction after medication titration to see if patient has had any improvement in ejection fraction.  He feels fine, denies edema, rest pain or shortness of breath.  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  His insulin regimen was recently titrated by primary care provider.  He otherwise feels fine from a cardiac perspective   Past Medical History:  Diagnosis Date  . CHF (congestive heart failure) (HCC)   . CKD (chronic kidney disease), stage III   . Diabetes mellitus   . ED (erectile dysfunction)   . Hypertension     Past Surgical History:  Procedure Laterality Date  . ANKLE SURGERY     both   . APPENDECTOMY    . back fusion     l4,l5  . BACK SURGERY    . LAPAROSCOPIC APPENDECTOMY N/A 03/28/2019   Procedure: APPENDECTOMY LAPAROSCOPIC;  Surgeon: Sung Amabile, DO;  Location: [facility] ORS;  Service: General;  Laterality: N/A;  . NASAL SINUS SURGERY      Current Medications: Current Meds  Medication Sig  . AMBULATORY NON FORMULARY MEDICATION Trimix (30/1/10)-(Pap/Phent/PGE)  Test Dose  67ml vial   Qty #3 Refills 0  [facility] [phone] Fax [phone]  . atorvastatin (LIPITOR) 40 MG tablet Take 1 tablet (40  mg total) by mouth daily at 6 PM.  . Blood Pressure Monitoring (BLOOD PRESSURE MONITOR/L CUFF) MISC 1 Units by Does not apply route as needed.  . carvedilol (COREG) 6.25 MG tablet Take 1 tablet (6.25 mg total) by mouth 2 (two) times daily.  . DULoxetine (CYMBALTA) 20 MG capsule Take 2 capsules by mouth daily.  Marland Kitchen NOVOLIN 70/30 FLEXPEN (70-30) 100 UNIT/ML PEN Inject into the skin 2 (two) times daily. Sliding scale  . potassium chloride SA (KLOR-CON) 20 MEQ tablet Take 1 tablet (20 mEq total) by mouth 2 (two) times daily.  Marland Kitchen rOPINIRole (REQUIP) 0.25 MG tablet Take 1 tablet by mouth 3 (three) times daily.  . sacubitril-valsartan (ENTRESTO) 24-26 MG Take 1 tablet by mouth 2 (two) times daily. Please call to schedule office visit for further refills. Thank you!  . sildenafil (VIAGRA) 100 MG tablet Take 1 tablet (100 mg total) by mouth daily as needed for erectile dysfunction. Take two hours prior to intercourse on an empty stomach  . torsemide (DEMADEX) 20 MG tablet Take 1 tablet (20 mg total) by mouth every other day.  . [DISCONTINUED] torsemide (DEMADEX) 20 MG tablet Take 1 tablet (20 mg total) by mouth daily.     Allergies:   Food   Social History   Socioeconomic History  . Marital status: Married    Spouse name: Not on file  . Number of  children: Not on file  . Years of education: Not on file  . Highest education level: Not on file  Occupational History  . Occupation: unemployed  Tobacco Use  . Smoking status: Never Smoker  . Smokeless tobacco: Never Used  Vaping Use  . Vaping Use: Never used  Substance and Sexual Activity  . Alcohol use: No  . Drug use: No  . Sexual activity: Yes    Birth control/protection: None  Other Topics Concern  . Not on file  Social History Narrative  . Not on file   Social Determinants of Health   Financial Resource Strain: Low Risk   . Difficulty of Paying Living Expenses: Not hard at all  Food Insecurity: No Food Insecurity  . Worried About  Programme researcher, broadcasting/film/video in the Last Year: Never true  . Ran Out of Food in the Last Year: Never true  Transportation Needs: No Transportation Needs  . Lack of Transportation (Medical): No  . Lack of Transportation (Non-Medical): No  Physical Activity: Sufficiently Active  . Days of Exercise per Week: 3 days  . Minutes of Exercise per Session: 50 min  Stress: No Stress Concern Present  . Feeling of Stress : Not at all  Social Connections: Moderately Integrated  . Frequency of Communication with Friends and Family: More than three times a week  . Frequency of Social Gatherings with Friends and Family: More than three times a week  . Attends Religious Services: 1 to 4 times per year  . Active Member of Clubs or Organizations: No  . Attends Banker Meetings: Never  . Marital Status: Married     Family History: The patient's family history includes Diabetes in his father; Hypertension in his mother.  ROS:   Please see the history of present illness.     All other systems reviewed and are negative.  EKGs/Labs/Other Studies Reviewed:    The following studies were reviewed today:    EKG:  EKG is  ordered today.  The ekg ordered today demonstrates normal sinus rhythm, normal ECG  Recent Labs: 09/15/2019: B Natriuretic Peptide 1,004.0; Hemoglobin 13.9; Platelets 166; TSH 1.207 09/21/2019: ALT 27; Pro B Natriuretic peptide (BNP) 62.0 11/10/2019: BUN 23; Creatinine, Ser 1.36; Magnesium 1.8; Potassium 3.2; Sodium 138  Recent Lipid Panel    Component Value Date/Time   CHOL 118 09/15/2019 0533   TRIG 49 09/15/2019 0533   HDL 48 09/15/2019 0533   CHOLHDL 2.5 09/15/2019 0533   VLDL 10 09/15/2019 0533   LDLCALC 60 09/15/2019 0533    Physical Exam:    VS:  BP 102/80 (BP Location: Left Arm, Patient Position: Sitting, Cuff Size: Normal)   Pulse 89   Ht 6' 1.5" (1.867 m)   Wt 233 lb 4 oz (105.8 kg)   SpO2 97%   BMI 30.36 kg/m     Wt Readings from Last 3 Encounters:    06/06/20 233 lb 4 oz (105.8 kg)  05/03/20 225 lb (102.1 kg)  03/24/20 225 lb (102.1 kg)     GEN:  Well nourished, well developed in no acute distress HEENT: Normal NECK: No JVD; No carotid bruits LYMPHATICS: No lymphadenopathy CARDIAC: RRR, no murmurs, rubs, gallops RESPIRATORY:  Clear to auscultation without rales, wheezing or rhonchi  ABDOMEN: Soft, non-tender, non-distended MUSCULOSKELETAL:  No edema; No deformity  SKIN: Warm and dry NEUROLOGIC:  Alert and oriented x 3 PSYCHIATRIC:  Normal affect   ASSESSMENT:   . 1. NICM (nonischemic cardiomyopathy) (HCC)  2. Essential hypertension    PLAN:     1. 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  Patient describes NYHA class II symptoms.  His original EF was less than 20%.  He is euvolemic.  Repeat echocardiogram 7/2021showed EF 40 to 45%, unchanged from prior on 12/2019.  Continue Entresto at 24-26 mg twice daily, Coreg to 6.25 twice daily.  Switch torsemide to 20 milligrams every other day.  Patient advised to monitor weight closely.  Resume torsemide to daily dosing if he  Encounters over 3 pounds daily weight gain or 5 pounds in a week.  2.  History of hypertension.  Blood pressure well controlled.  Entresto and Coreg as above  Follow-up in 3 months  Total encounter time more than 41 minutes  Greater than 50% was spent in counseling and coordination of care with the patient  This note was generated in part or whole with voice recognition software. Voice recognition is usually quite accurate but there are transcription errors that can and very often do occur. I apologize for any typographical errors that were not detected and corrected.  Medication Adjustments/Labs and Tests Ordered: Current medicines are reviewed at length with the patient today.  Concerns regarding medicines are outlined above.  No orders of the defined types were placed in this encounter.  Meds ordered this  encounter  Medications  . torsemide (DEMADEX) 20 MG tablet    Sig: Take 1 tablet (20 mg total) by mouth every other day.    Dispense:  20 tablet    Refill:  6    Patient Instructions  Medication Instructions:   Your physician has recommended you make the following change in your medication:   Torsemide [facility]): Take 1 tablet (20 mg total) by mouth every other day  *If you need a refill on your cardiac medications before your next appointment, please call your pharmacy*   Lab Work: None Ordered If you have labs (blood work) drawn today and your tests are completely normal, you will receive your results only by: Marland Kitchen MyChart Message (if you have MyChart) OR . A paper copy in the mail If you have any lab test that is abnormal or we need to change your treatment, we will call you to review the results.   Testing/Procedures: None Ordered   Follow-Up: At [facility], you and your health needs are our priority.  As part of our continuing mission to provide you with exceptional heart care, we have created designated Provider Care Teams.  These Care Teams include your primary Cardiologist (physician) and Advanced Practice Providers (APPs -  Physician Assistants and Nurse Practitioners) who all work together to provide you with the care you need, when you need it.  We recommend signing up for the patient portal called "MyChart".  Sign up information is provided on this After Visit Summary.  MyChart is used to connect with patients for Virtual Visits (Telemedicine).  Patients are able to view lab/test results, encounter notes, upcoming appointments, etc.  Non-urgent messages can be sent to your provider as well.   To learn more about what you can do with MyChart, go to [email or web address].    Your next appointment:   3 month(s)  The format for your next appointment:   In Person  Provider:   Debbe Odea, MD   Other Instructions N/A     Signed, Debbe Odea, MD   06/06/2020 12:25 PM    [facility]

## 2020-06-06 NOTE — Patient Instructions (Signed)
Medication Instructions:   Your physician has recommended you make the following change in your medication:   Torsemide [facility]): Take 1 tablet (20 mg total) by mouth every other day  *If you need a refill on your cardiac medications before your next appointment, please call your pharmacy*   Lab Work: None Ordered If you have labs (blood work) drawn today and your tests are completely normal, you will receive your results only by: Marland Kitchen MyChart Message (if you have MyChart) OR . A paper copy in the mail If you have any lab test that is abnormal or we need to change your treatment, we will call you to review the results.   Testing/Procedures: None Ordered   Follow-Up: At [facility], you and your health needs are our priority.  As part of our continuing mission to provide you with exceptional heart care, we have created designated Provider Care Teams.  These Care Teams include your primary Cardiologist (physician) and Advanced Practice Providers (APPs -  Physician Assistants and Nurse Practitioners) who all work together to provide you with the care you need, when you need it.  We recommend signing up for the patient portal called "MyChart".  Sign up information is provided on this After Visit Summary.  MyChart is used to connect with patients for Virtual Visits (Telemedicine).  Patients are able to view lab/test results, encounter notes, upcoming appointments, etc.  Non-urgent messages can be sent to your provider as well.   To learn more about what you can do with MyChart, go to [email or web address].    Your next appointment:   3 month(s)  The format for your next appointment:   In Person  Provider:   Debbe Odea, MD   Other Instructions N/A

## 2020-06-07 ENCOUNTER — Encounter: Payer: Medicare Other | Admitting: *Deleted

## 2020-06-07 DIAGNOSIS — I5022 Chronic systolic (congestive) heart failure: Secondary | ICD-10-CM | POA: Diagnosis not present

## 2020-06-07 DIAGNOSIS — I110 Hypertensive heart disease with heart failure: Secondary | ICD-10-CM | POA: Diagnosis not present

## 2020-06-07 DIAGNOSIS — Z79899 Other long term (current) drug therapy: Secondary | ICD-10-CM | POA: Diagnosis not present

## 2020-06-07 DIAGNOSIS — E119 Type 2 diabetes mellitus without complications: Secondary | ICD-10-CM | POA: Diagnosis not present

## 2020-06-07 NOTE — Addendum Note (Signed)
Addended by: Festus Aloe on: 06/07/2020 10:58 AM   Modules accepted: Orders

## 2020-06-07 NOTE — Progress Notes (Signed)
Daily Session Note  Patient Details  Name: [name] MRN: [id number] Date of Birth: 1975/12/27 Referring Provider:     Cardiac Rehab from 11/04/2019 in [facility] Cardiac and Pulmonary Rehab  Referring Provider [name]      Encounter Date: 06/07/2020  Check In:  Session Check In - 06/07/20 1149      Check-In   Supervising physician immediately available to respond to emergencies See telemetry face sheet for immediately available ER MD    Location [facility]-Cardiac & Pulmonary Rehab    Staff Present Nyoka Cowden, RN, BSN, Tyna Jaksch, MS Exercise Physiologist;Amanda Oletta Darter, [place], ACSM CEP, Exercise Physiologist    Virtual Visit No    Medication changes reported     No    Fall or balance concerns reported    No    Tobacco Cessation No Change    Warm-up and Cool-down Performed on first and last piece of equipment    Resistance Training Performed Yes    VAD Patient? No              Social History   Tobacco Use  Smoking Status Never Smoker  Smokeless Tobacco Never Used    Goals Met:  Independence with exercise equipment Exercise tolerated well No report of cardiac concerns or symptoms Strength training completed today  Goals Unmet:  Not Applicable  Comments: Pt able to follow exercise prescription today without complaint.  Will continue to monitor for progression.    Dr. Emily Filbert is Medical Director for [place] and [facility].

## 2020-06-09 ENCOUNTER — Other Ambulatory Visit: Payer: Self-pay

## 2020-06-09 ENCOUNTER — Encounter: Payer: Medicare Other | Admitting: *Deleted

## 2020-06-09 DIAGNOSIS — I5022 Chronic systolic (congestive) heart failure: Secondary | ICD-10-CM

## 2020-06-09 DIAGNOSIS — E119 Type 2 diabetes mellitus without complications: Secondary | ICD-10-CM | POA: Diagnosis not present

## 2020-06-09 DIAGNOSIS — I5020 Unspecified systolic (congestive) heart failure: Secondary | ICD-10-CM

## 2020-06-09 DIAGNOSIS — Z79899 Other long term (current) drug therapy: Secondary | ICD-10-CM | POA: Diagnosis not present

## 2020-06-09 DIAGNOSIS — I110 Hypertensive heart disease with heart failure: Secondary | ICD-10-CM | POA: Diagnosis not present

## 2020-06-09 MED ORDER — ENTRESTO 24-26 MG PO TABS: 1.0000 | ORAL_TABLET | Freq: Two times a day (BID) | ORAL | 2 refills | Status: DC

## 2020-06-09 NOTE — Progress Notes (Signed)
Daily Session Note  Patient Details  Name: [name] MRN: [id number] Date of Birth: 07/28/1976 Referring Provider:     Cardiac Rehab from 11/04/2019 in [facility] Cardiac and Pulmonary Rehab  Referring Provider Christopher End      Encounter Date: 06/09/2020  Check In:  Session Check In - 06/09/20 1116      Check-In   Supervising physician immediately available to respond to emergencies See telemetry face sheet for immediately available ER MD    Location [facility]-Cardiac & Pulmonary Rehab    Staff Present Meredith Craven, RN BSN;Kara Langdon, MS Exercise Physiologist;Jessica Hawkins, MA, RCEP, CCRP, CCET;Amanda Sommer, BA, ACSM CEP, Exercise Physiologist    Virtual Visit No    Medication changes reported     No    Fall or balance concerns reported    No    Warm-up and Cool-down Performed on first and last piece of equipment    Resistance Training Performed Yes    VAD Patient? No    PAD/SET Patient? No      Pain Assessment   Currently in Pain? No/denies              Social History   Tobacco Use  Smoking Status Never Smoker  Smokeless Tobacco Never Used    Goals Met:  Independence with exercise equipment Exercise tolerated well No report of cardiac concerns or symptoms Strength training completed today  Goals Unmet:  Not Applicable  Comments: Pt able to follow exercise prescription today without complaint.  Will continue to monitor for progression.    Dr. Mark Miller is Medical Director for [facility] and [facility]. 

## 2020-06-14 ENCOUNTER — Other Ambulatory Visit: Payer: Self-pay

## 2020-06-14 ENCOUNTER — Encounter: Payer: Medicare Other | Admitting: *Deleted

## 2020-06-14 DIAGNOSIS — E119 Type 2 diabetes mellitus without complications: Secondary | ICD-10-CM | POA: Diagnosis not present

## 2020-06-14 DIAGNOSIS — I5022 Chronic systolic (congestive) heart failure: Secondary | ICD-10-CM | POA: Diagnosis not present

## 2020-06-14 DIAGNOSIS — I110 Hypertensive heart disease with heart failure: Secondary | ICD-10-CM | POA: Diagnosis not present

## 2020-06-14 DIAGNOSIS — Z79899 Other long term (current) drug therapy: Secondary | ICD-10-CM | POA: Diagnosis not present

## 2020-06-14 NOTE — Progress Notes (Signed)
Daily Session Note  Patient Details  Name: [name] MRN: [id number] Date of Birth: 09/07/1976 Referring Provider:     Cardiac Rehab from 11/04/2019 in [facility] Cardiac and Pulmonary Rehab  Referring Provider Christopher End      Encounter Date: 06/14/2020  Check In:  Session Check In - 06/14/20 1138      Check-In   Supervising physician immediately available to respond to emergencies See telemetry face sheet for immediately available ER MD    Location [facility]-Cardiac & Pulmonary Rehab    Staff Present Susanne Bice, RN, BSN, CCRP;Melissa Caiola RDN, LDN;Joseph Hood RCP,RRT,BSRT;Amanda Sommer, BA, ACSM CEP, Exercise Physiologist    Virtual Visit No    Medication changes reported     No    Fall or balance concerns reported    No    Warm-up and Cool-down Performed on first and last piece of equipment    Resistance Training Performed Yes    VAD Patient? No    PAD/SET Patient? No      Pain Assessment   Currently in Pain? No/denies              Social History   Tobacco Use  Smoking Status Never Smoker  Smokeless Tobacco Never Used    Goals Met:  Independence with exercise equipment Exercise tolerated well No report of cardiac concerns or symptoms  Goals Unmet:  Not Applicable  Comments: Pt able to follow exercise prescription today without complaint.  Will continue to monitor for progression.    Dr. Mark Miller is Medical Director for [facility] and [facility]. 

## 2020-06-15 ENCOUNTER — Encounter: Payer: Self-pay | Admitting: Family Medicine

## 2020-06-15 ENCOUNTER — Ambulatory Visit (INDEPENDENT_AMBULATORY_CARE_PROVIDER_SITE_OTHER): Payer: Medicare Other | Admitting: Family Medicine

## 2020-06-15 VITALS — BP 100/80 | HR 80 | Temp 97.7°F | Ht 73.0 in | Wt 233.8 lb

## 2020-06-15 DIAGNOSIS — K219 Gastro-esophageal reflux disease without esophagitis: Secondary | ICD-10-CM | POA: Insufficient documentation

## 2020-06-15 DIAGNOSIS — R1310 Dysphagia, unspecified: Secondary | ICD-10-CM | POA: Insufficient documentation

## 2020-06-15 MED ORDER — PANTOPRAZOLE SODIUM 40 MG PO TBEC
40.0000 mg | DELAYED_RELEASE_TABLET | Freq: Every day | ORAL | 3 refills | Status: DC
Start: 2020-06-15 — End: 2021-01-09

## 2020-06-15 NOTE — Assessment & Plan Note (Signed)
Start PPI, lifestyle changes reviewed. Call if not improving as expected.

## 2020-06-15 NOTE — Patient Instructions (Signed)
Work on lifestyle changes for reflux.  Start pantoprazole 40 mg daily..  Call if not better in 2 weeks for change in medication and referral to GI given difficult swallowing.  If it improving continue the pantoprazole for 4-6 weeks then wean off.  Gastroesophageal Reflux Disease, Adult Gastroesophageal reflux (GER) happens when acid from the stomach flows up into the tube that connects the mouth and the stomach (esophagus). Normally, food travels down the esophagus and stays in the stomach to be digested. However, when a person has GER, food and stomach acid sometimes move back up into the esophagus. If this becomes a more serious problem, the person may be diagnosed with a disease called gastroesophageal reflux disease (GERD). GERD occurs when the reflux:  Happens often.  Causes frequent or severe symptoms.  Causes problems such as damage to the esophagus. When stomach acid comes in contact with the esophagus, the acid may cause soreness (inflammation) in the esophagus. Over time, GERD may create small holes (ulcers) in the lining of the esophagus. What are the causes? This condition is caused by a problem with the muscle between the esophagus and the stomach (lower esophageal sphincter, or LES). Normally, the LES muscle closes after food passes through the esophagus to the stomach. When the LES is weakened or abnormal, it does not close properly, and that allows food and stomach acid to go back up into the esophagus. The LES can be weakened by certain dietary substances, medicines, and medical conditions, including:  Tobacco use.  Pregnancy.  Having a hiatal hernia.  Alcohol use.  Certain foods and beverages, such as coffee, chocolate, onions, and peppermint. What increases the risk? You are more likely to develop this condition if you:  Have an increased body weight.  Have a connective tissue disorder.  Use NSAID medicines. What are the signs or symptoms? Symptoms of this  condition include:  Heartburn.  Difficult or painful swallowing.  The feeling of having a lump in the throat.  Abitter taste in the mouth.  Bad breath.  Having a large amount of saliva.  Having an upset or bloated stomach.  Belching.  Chest pain. Different conditions can cause chest pain. Make sure you see your health care provider if you experience chest pain.  Shortness of breath or wheezing.  Ongoing (chronic) cough or a night-time cough.  Wearing away of tooth enamel.  Weight loss. How is this diagnosed? Your health care provider will take a medical history and perform a physical exam. To determine if you have mild or severe GERD, your health care provider may also monitor how you respond to treatment. You may also have tests, including:  A test to examine your stomach and esophagus with a small camera (endoscopy).  A test thatmeasures the acidity level in your esophagus.  A test thatmeasures how much pressure is on your esophagus.  A barium swallow or modified barium swallow test to show the shape, size, and functioning of your esophagus. How is this treated? The goal of treatment is to help relieve your symptoms and to prevent complications. Treatment for this condition may vary depending on how severe your symptoms are. Your health care provider may recommend:  Changes to your diet.  Medicine.  Surgery. Follow these instructions at home: Eating and drinking   Follow a diet as recommended by your health care provider. This may involve avoiding foods and drinks such as: ? Coffee and tea (with or without caffeine). ? Drinks that containalcohol. ? Energy drinks and  sports drinks. ? Carbonated drinks or sodas. ? Chocolate and cocoa. ? Peppermint and mint flavorings. ? Garlic and onions. ? Horseradish. ? Spicy and acidic foods, including peppers, chili powder, curry powder, vinegar, hot sauces, and barbecue sauce. ? Citrus fruit juices and citrus  fruits, such as oranges, lemons, and limes. ? Tomato-based foods, such as red sauce, chili, salsa, and pizza with red sauce. ? Fried and fatty foods, such as donuts, french fries, potato chips, and high-fat dressings. ? High-fat meats, such as hot dogs and fatty cuts of red and white meats, such as rib eye steak, sausage, ham, and bacon. ? High-fat dairy items, such as whole milk, butter, and cream cheese.  Eat small, frequent meals instead of large meals.  Avoid drinking large amounts of liquid with your meals.  Avoid eating meals during the 2-3 hours before bedtime.  Avoid lying down right after you eat.  Do not exercise right after you eat. Lifestyle   Do not use any products that contain nicotine or tobacco, such as cigarettes, e-cigarettes, and chewing tobacco. If you need help quitting, ask your health care provider.  Try to reduce your stress by using methods such as yoga or meditation. If you need help reducing stress, ask your health care provider.  If you are overweight, reduce your weight to an amount that is healthy for you. Ask your health care provider for guidance about a safe weight loss goal. General instructions  Pay attention to any changes in your symptoms.  Take over-the-counter and prescription medicines only as told by your health care provider. Do not take aspirin, ibuprofen, or other NSAIDs unless your health care provider told you to do so.  Wear loose-fitting clothing. Do not wear anything tight around your waist that causes pressure on your abdomen.  Raise (elevate) the head of your bed about 6 inches (15 cm).  Avoid bending over if this makes your symptoms worse.  Keep all follow-up visits as told by your health care provider. This is important. Contact a health care provider if:  You have: ? New symptoms. ? Unexplained weight loss. ? Difficulty swallowing or it hurts to swallow. ? Wheezing or a persistent cough. ? A hoarse voice.  Your  symptoms do not improve with treatment. Get help right away if you:  Have pain in your arms, neck, jaw, teeth, or back.  Feel sweaty, dizzy, or light-headed.  Have chest pain or shortness of breath.  Vomit and your vomit looks like blood or coffee grounds.  Faint.  Have stool that is bloody or black.  Cannot swallow, drink, or eat. Summary  Gastroesophageal reflux happens when acid from the stomach flows up into the esophagus. GERD is a disease in which the reflux happens often, causes frequent or severe symptoms, or causes problems such as damage to the esophagus.  Treatment for this condition may vary depending on how severe your symptoms are. Your health care provider may recommend diet and lifestyle changes, medicine, or surgery.  Contact a health care provider if you have new or worsening symptoms.  Take over-the-counter and prescription medicines only as told by your health care provider. Do not take aspirin, ibuprofen, or other NSAIDs unless your health care provider told you to do so.  Keep all follow-up visits as told by your health care provider. This is important. This information is not intended to replace advice given to you by your health care provider. Make sure you discuss any questions you have with your health  care provider. Document Revised: 05/14/2018 Document Reviewed: 05/14/2018 Elsevier Patient Education  [employer].

## 2020-06-15 NOTE — Assessment & Plan Note (Signed)
Given concern for possible stricture or esophageal spasm.. if not improving in 2 weeks.. plan referral to GI for possible endoscopy.

## 2020-06-15 NOTE — Progress Notes (Signed)
Chief Complaint  Patient presents with  . Gastroesophageal Reflux    History of Present Illness: HPI    44 year old patient of Deboraha Sprang with new onset reflux in last week.  No new chest pain, no SOB.  Has some neck, throat tightness, notes regurgitation of food when lying down. Occurring daily.  When he eats he has to drink to move food down. Some trouble swallowing.. solids. Sour taste in mouth.  Some hoarse voice.  Has not tried any OTC in last week.   No change in weight, no new foods, no stress.  Has history of GERD in 10 years ago.  He has tried  Prevacid, nexium and prilosec in past.  No new meds. No NSAIDs.  This visit occurred during the SARS-CoV-2 public health emergency.  Safety protocols were in place, including screening questions prior to the visit, additional usage of staff PPE, and extensive cleaning of exam room while observing appropriate contact time as indicated for disinfecting solutions.   COVID 19 screen:  No recent travel or known exposure to COVID19 The patient denies respiratory symptoms of COVID 19 at this time. The importance of social distancing was discussed today.     Review of Systems  Constitutional: Negative for chills and fever.  HENT: Negative for congestion and ear pain.   Eyes: Negative for pain and redness.  Respiratory: Negative for cough and shortness of breath.   Cardiovascular: Negative for chest pain, palpitations and leg swelling.  Gastrointestinal: Negative for abdominal pain, blood in stool, constipation, diarrhea, nausea and vomiting.  Genitourinary: Negative for dysuria.  Musculoskeletal: Negative for falls and myalgias.  Skin: Negative for rash.  Neurological: Negative for dizziness.  Psychiatric/Behavioral: Negative for depression. The patient is not nervous/anxious.       Past Medical History:  Diagnosis Date  . CHF (congestive heart failure) (HCC)   . CKD (chronic kidney disease), stage III   . Diabetes  mellitus   . ED (erectile dysfunction)   . Hypertension     reports that he has never smoked. He has never used smokeless tobacco. He reports that he does not drink alcohol and does not use drugs.   Current Outpatient Medications:  .  AMBULATORY NON FORMULARY MEDICATION, Trimix (30/1/10)-(Pap/Phent/PGE)  Test Dose  110ml vial   Qty #3 Refills 0  [facility] [phone] Fax [phone], Disp: 3 mL, Rfl: 0 .  atorvastatin (LIPITOR) 40 MG tablet, Take 1 tablet (40 mg total) by mouth daily at 6 PM., Disp: 90 tablet, Rfl: 0 .  Blood Pressure Monitoring (BLOOD PRESSURE MONITOR/L CUFF) MISC, 1 Units by Does not apply route as needed., Disp: 1 each, Rfl: 0 .  carvedilol (COREG) 6.25 MG tablet, Take 1 tablet (6.25 mg total) by mouth 2 (two) times daily., Disp: 180 tablet, Rfl: 2 .  DULoxetine (CYMBALTA) 20 MG capsule, Take 2 capsules by mouth daily., Disp: , Rfl:  .  NOVOLIN 70/30 FLEXPEN (70-30) 100 UNIT/ML PEN, Inject into the skin 2 (two) times daily. Sliding scale, Disp: , Rfl:  .  potassium chloride SA (KLOR-CON) 20 MEQ tablet, Take 1 tablet (20 mEq total) by mouth 2 (two) times daily., Disp: 60 tablet, Rfl: 2 .  rOPINIRole (REQUIP) 0.25 MG tablet, Take 1 tablet by mouth 3 (three) times daily., Disp: , Rfl:  .  sacubitril-valsartan (ENTRESTO) 24-26 MG, Take 1 tablet by mouth 2 (two) times daily., Disp: 60 tablet, Rfl: 2 .  sildenafil (VIAGRA) 100 MG tablet, Take 1 tablet (100  mg total) by mouth daily as needed for erectile dysfunction. Take two hours prior to intercourse on an empty stomach, Disp: 30 tablet, Rfl: 2 .  torsemide (DEMADEX) 20 MG tablet, Take 1 tablet (20 mg total) by mouth every other day., Disp: 20 tablet, Rfl: 6   Observations/Objective: Blood pressure 100/80, pulse 80, temperature 97.7 F (36.5 C), temperature source Temporal, height 6\' 1"  (1.854 m), weight (!) 233 lb 12 oz (106 kg), SpO2 96 %.  Physical Exam Constitutional:      Appearance: He is well-developed. He is  obese.  HENT:     Head: Normocephalic.     Right Ear: Hearing normal.     Left Ear: Hearing normal.     Nose: Nose normal.  Neck:     Thyroid: No thyroid mass or thyromegaly.     Vascular: No carotid bruit.     Trachea: Trachea normal.  Cardiovascular:     Rate and Rhythm: Normal rate and regular rhythm.     Pulses: Normal pulses.     Heart sounds: Heart sounds not distant. No murmur heard.  No friction rub. No gallop.      Comments: No peripheral edema Pulmonary:     Effort: Pulmonary effort is normal. No respiratory distress.     Breath sounds: Normal breath sounds.  Skin:    General: Skin is warm and dry.     Findings: No rash.  Psychiatric:        Speech: Speech normal.        Behavior: Behavior normal.        Thought Content: Thought content normal.      Assessment and Plan   Gastroesophageal reflux disease Start PPI, lifestyle changes reviewed. Call if not improving as expected.  Dysphagia Given concern for possible stricture or esophageal spasm.. if not improving in 2 weeks.. plan referral to GI for possible endoscopy.     , MD

## 2020-06-16 ENCOUNTER — Other Ambulatory Visit: Payer: Self-pay

## 2020-06-16 ENCOUNTER — Encounter: Payer: Medicare Other | Admitting: *Deleted

## 2020-06-16 DIAGNOSIS — I110 Hypertensive heart disease with heart failure: Secondary | ICD-10-CM | POA: Diagnosis not present

## 2020-06-16 DIAGNOSIS — I5022 Chronic systolic (congestive) heart failure: Secondary | ICD-10-CM

## 2020-06-16 DIAGNOSIS — Z79899 Other long term (current) drug therapy: Secondary | ICD-10-CM | POA: Diagnosis not present

## 2020-06-16 DIAGNOSIS — E119 Type 2 diabetes mellitus without complications: Secondary | ICD-10-CM | POA: Diagnosis not present

## 2020-06-16 NOTE — Progress Notes (Signed)
Daily Session Note  Patient Details  Name: [name] MRN: [id number] Date of Birth: 28-Aug-1976 Referring Provider:     Cardiac Rehab from 11/04/2019 in [facility] Cardiac and Pulmonary Rehab  Referring Provider [name]      Encounter Date: 06/16/2020  Check In:  Session Check In - 06/16/20 1045      Check-In   Supervising physician immediately available to respond to emergencies See telemetry face sheet for immediately available ER MD    Location [facility]-Cardiac & Pulmonary Rehab    Staff Present Renita Papa, RN BSN;Melissa Caiola RDN, Rowe Pavy, BA, ACSM CEP, Exercise Physiologist;Joseph Tessie Fass RCP,RRT,BSRT    Virtual Visit No    Medication changes reported     No    Fall or balance concerns reported    No    Warm-up and Cool-down Performed on first and last piece of equipment    Resistance Training Performed Yes    VAD Patient? No    PAD/SET Patient? No      Pain Assessment   Currently in Pain? No/denies              Social History   Tobacco Use  Smoking Status Never Smoker  Smokeless Tobacco Never Used    Goals Met:  Independence with exercise equipment Exercise tolerated well No report of cardiac concerns or symptoms Strength training completed today  Goals Unmet:  Not Applicable  Comments: Pt able to follow exercise prescription today without complaint.  Will continue to monitor for progression.      Dr. Emily Filbert is Medical Director for [place] and [facility].

## 2020-06-17 ENCOUNTER — Other Ambulatory Visit: Payer: Self-pay | Admitting: *Deleted

## 2020-06-17 DIAGNOSIS — I5020 Unspecified systolic (congestive) heart failure: Secondary | ICD-10-CM

## 2020-06-17 MED ORDER — ENTRESTO 24-26 MG PO TABS: 1.0000 | ORAL_TABLET | Freq: Two times a day (BID) | ORAL | 3 refills | Status: DC

## 2020-06-21 ENCOUNTER — Encounter: Payer: Medicare Other | Attending: Internal Medicine | Admitting: *Deleted

## 2020-06-21 ENCOUNTER — Other Ambulatory Visit: Payer: Self-pay

## 2020-06-21 DIAGNOSIS — I5022 Chronic systolic (congestive) heart failure: Secondary | ICD-10-CM | POA: Diagnosis not present

## 2020-06-21 DIAGNOSIS — I110 Hypertensive heart disease with heart failure: Secondary | ICD-10-CM | POA: Diagnosis not present

## 2020-06-21 DIAGNOSIS — Z79899 Other long term (current) drug therapy: Secondary | ICD-10-CM | POA: Insufficient documentation

## 2020-06-21 DIAGNOSIS — E119 Type 2 diabetes mellitus without complications: Secondary | ICD-10-CM | POA: Diagnosis not present

## 2020-06-21 NOTE — Progress Notes (Signed)
Daily Session Note  Patient Details  Name: [name] MRN: [id number] Date of Birth: [birth year] Referring Provider:     Cardiac Rehab from 11/04/2019 in [facility] Cardiac and Pulmonary Rehab  Referring Provider [name]      Encounter Date: 06/21/2020  Check In:  Session Check In - 06/21/20 1129      Check-In   Supervising physician immediately available to respond to emergencies See telemetry face sheet for immediately available ER MD    Location [facility]-Cardiac & Pulmonary Rehab    Staff Present Heath Lark, RN, BSN, CCRP;Melissa [place] RDN, LDN;Joseph [employer], [place], ACSM CEP, Exercise Physiologist    Virtual Visit No    Medication changes reported     No    Fall or balance concerns reported    No    Warm-up and Cool-down Performed on first and last piece of equipment    Resistance Training Performed Yes    VAD Patient? No    PAD/SET Patient? No      Pain Assessment   Currently in Pain? No/denies              Social History   Tobacco Use  Smoking Status Never Smoker  Smokeless Tobacco Never Used    Goals Met:  Independence with exercise equipment Exercise tolerated well No report of cardiac concerns or symptoms  Goals Unmet:  Not Applicable  Comments: Pt able to follow exercise prescription today without complaint.  Will continue to monitor for progression.    Dr. Emily Filbert is Medical Director for [place] and [facility].

## 2020-06-23 ENCOUNTER — Encounter: Payer: Medicare Other | Admitting: *Deleted

## 2020-06-23 ENCOUNTER — Other Ambulatory Visit: Payer: Self-pay

## 2020-06-23 DIAGNOSIS — E119 Type 2 diabetes mellitus without complications: Secondary | ICD-10-CM | POA: Diagnosis not present

## 2020-06-23 DIAGNOSIS — Z79899 Other long term (current) drug therapy: Secondary | ICD-10-CM | POA: Diagnosis not present

## 2020-06-23 DIAGNOSIS — I5022 Chronic systolic (congestive) heart failure: Secondary | ICD-10-CM

## 2020-06-23 DIAGNOSIS — I110 Hypertensive heart disease with heart failure: Secondary | ICD-10-CM | POA: Diagnosis not present

## 2020-06-23 NOTE — Progress Notes (Signed)
Daily Session Note  Patient Details  Name: [name] MRN: [id number] Date of Birth: Jan 06, 1976 Referring Provider:     Cardiac Rehab from 11/04/2019 in [facility] Cardiac and Pulmonary Rehab  Referring Provider [name]      Encounter Date: 06/23/2020  Check In:  Session Check In - 06/23/20 1116      Check-In   Supervising physician immediately available to respond to emergencies See telemetry face sheet for immediately available ER MD    Location [facility]-Cardiac & Pulmonary Rehab    Staff Present Renita Papa, RN BSN;Joseph Lou Miner, [place] Exercise Physiologist;Melissa [place] Pillar RDN, LDN    Virtual Visit No    Medication changes reported     No    Fall or balance concerns reported    No    Warm-up and Cool-down Performed on first and last piece of equipment    Resistance Training Performed Yes    VAD Patient? No    PAD/SET Patient? No      Pain Assessment   Currently in Pain? No/denies              Social History   Tobacco Use  Smoking Status Never Smoker  Smokeless Tobacco Never Used    Goals Met:  Independence with exercise equipment Exercise tolerated well No report of cardiac concerns or symptoms Strength training completed today  Goals Unmet:  Not Applicable  Comments: Pt able to follow exercise prescription today without complaint.  Will continue to monitor for progression.    Dr. Emily Filbert is Medical Director for [place] and [facility].

## 2020-06-28 ENCOUNTER — Encounter: Payer: Medicare Other | Admitting: *Deleted

## 2020-06-28 ENCOUNTER — Other Ambulatory Visit: Payer: Self-pay

## 2020-06-28 DIAGNOSIS — I5022 Chronic systolic (congestive) heart failure: Secondary | ICD-10-CM

## 2020-06-28 NOTE — Progress Notes (Signed)
Daily Session Note  Patient Details  Name: [name] MRN: [id number] Date of Birth: 1976-07-24 Referring Provider:     Cardiac Rehab from 11/04/2019 in [facility] Cardiac [facility]  Referring Provider [name]      Encounter Date: 06/28/2020  Check In:  Session Check In - 06/28/20 1612      Check-In   Supervising physician immediately available to respond to emergencies See telemetry face sheet for immediately available ER MD    Location [facility]-Cardiac & Pulmonary Rehab    Staff Present Heath Lark, RN, BSN, CCRP;Joseph Hood RCP,RRT,BSRT;Amanda Re[place], [place], ACSM CEP, Exercise Physiologist    Virtual Visit No    Medication changes reported     No    Fall or balance concerns reported    No    Warm-up and Cool-down Performed on first and last piece of equipment    Resistance Training Performed Yes    VAD Patient? No    PAD/SET Patient? No      Pain Assessment   Currently in Pain? No/denies              Social History   Tobacco Use  Smoking Status Never Smoker  Smokeless Tobacco Never Used    Goals Met:  Independence with exercise equipment Exercise tolerated well No report of cardiac concerns or symptoms Strength training completed today  Goals Unmet:  Not Applicable  Comments: Pt able to follow exercise prescription today without complaint.  Will continue to monitor for progression.     Dr. Emily Filbert is Medical Director for [facility].

## 2020-06-29 ENCOUNTER — Encounter: Payer: Self-pay | Admitting: *Deleted

## 2020-06-29 DIAGNOSIS — I5022 Chronic systolic (congestive) heart failure: Secondary | ICD-10-CM

## 2020-06-29 NOTE — Progress Notes (Signed)
Cardiac Individual Treatment Plan  Patient Details  Name: [name] MRN: [id number] Date of Birth: 1976-09-22 Referring Provider:     Cardiac Rehab from 11/04/2019 in [facility] Cardiac and Pulmonary Rehab  Referring Provider [name]      Initial Encounter Date:    Cardiac Rehab from 11/04/2019 in [facility] Cardiac and Pulmonary Rehab  Date 11/04/19      Visit Diagnosis: Heart failure, chronic systolic ([place])  Patient's Home Medications on Admission:  Current Outpatient Medications:  .  AMBULATORY NON FORMULARY MEDICATION, Trimix (30/1/10)-(Pap/Phent/PGE)  Test Dose  70m vial   Qty #3 Refills 0  [facility] [phone] [phone] Disp: 3 mL, Rfl: 0 .  atorvastatin (LIPITOR) 40 MG tablet, Take 1 tablet (40 mg total) by mouth daily at 6 PM., Disp: 90 tablet, Rfl: 0 .  Blood Pressure Monitoring (BLOOD PRESSURE MONITOR/L CUFF) MISC, 1 Units by Does not apply route as needed., Disp: 1 each, Rfl: 0 .  carvedilol (COREG) 6.25 MG tablet, Take 1 tablet (6.25 mg total) by mouth 2 (two) times daily., Disp: 180 tablet, Rfl: 2 .  DULoxetine (CYMBALTA) 20 MG capsule, Take 2 capsules by mouth daily., Disp: , Rfl:  .  NOVOLIN 70/30 FLEXPEN (70-30) 100 UNIT/ML PEN, Inject into the skin 2 (two) times daily. Sliding scale, Disp: , Rfl:  .  pantoprazole (PROTONIX) 40 MG tablet, Take 1 tablet (40 mg total) by mouth daily., Disp: 30 tablet, Rfl: 3 .  potassium chloride SA (KLOR-CON) 20 MEQ tablet, Take 1 tablet (20 mEq total) by mouth 2 (two) times daily., Disp: 60 tablet, Rfl: 2 .  rOPINIRole (REQUIP) 0.25 MG tablet, Take 1 tablet by mouth 3 (three) times daily., Disp: , Rfl:  .  sacubitril-valsartan (ENTRESTO) 24-26 MG, Take 1 tablet by mouth 2 (two) times daily., Disp: 60 tablet, Rfl: 3 .  sildenafil (VIAGRA) 100 MG tablet, Take 1 tablet (100 mg total) by mouth daily as needed for erectile dysfunction. Take two hours prior to intercourse on an empty stomach, Disp: 30 tablet, Rfl: 2 .   torsemide (DEMADEX) 20 MG tablet, Take 1 tablet (20 mg total) by mouth every other day., Disp: 20 tablet, Rfl: 6  Past Medical History: Past Medical History:  Diagnosis Date  . CHF (congestive heart failure) [place]   . CKD (chronic kidney disease), stage III   . Diabetes mellitus   . ED (erectile dysfunction)   . Hypertension     Tobacco Use: Social History   Tobacco Use  Smoking Status Never Smoker  Smokeless Tobacco Never Used    Labs: Recent Review Flowsheet Data    Labs for ITP Cardiac and Pulmonary Rehab Latest Ref Rng & Units 04/25/2011 02/09/2012 05/26/2018 09/14/2019 09/15/2019   Cholestrol 0 - 200 mg/dL - - 177 - 118   LDLCALC 0 - 99 mg/dL - - 104(H) - 60   HDL >40 mg/dL - - 57.00 - 48   Trlycerides <150 mg/dL - - 81.0 - 49   Hemoglobin A1c 4.8 - 5.6 % - - 13.4(A) 7.4(H) 7.5(H)   TCO2 0 - 100 mmol/L 30 29 - - -       Exercise Target Goals: Exercise Program Goal: Individual exercise prescription set using results from initial 6 min walk test and THRR while considering  patient's activity barriers and safety.   Exercise Prescription Goal: Initial exercise prescription builds to 30-45 minutes a day of aerobic activity, 2-3 days per week.  Home exercise guidelines will be given to  patient during program as part of exercise prescription that the participant will acknowledge.   Education: Aerobic Exercise & Resistance Training: - Gives group verbal and written instruction on the various components of exercise. Focuses on aerobic and resistive training programs and the benefits of this training and how to safely progress through these programs..   Education: Exercise & Equipment Safety: - Individual verbal instruction and demonstration of equipment use and safety with use of the equipment.   Education: Exercise Physiology & General Exercise Guidelines: - Group verbal and written instruction with models to review the exercise physiology of the cardiovascular system and  associated critical values. Provides general exercise guidelines with specific guidelines to those with heart or lung disease.    Education: Flexibility, Balance, Mind/Body Relaxation: Provides group verbal/written instruction on the benefits of flexibility and balance training, including mind/body exercise modes such as yoga, pilates and tai chi.  Demonstration and skill practice provided.   Activity Barriers & Risk Stratification:   6 Minute Walk:  6 Minute Walk    Row Name 06/16/20 1130         6 Minute Walk   Phase Discharge     Distance 1432 feet     Distance % Change 15 %     Distance Feet Change 190 ft     Walk Time 6 minutes     # of Rest Breaks 0     MPH 2.7     METS 3.7     RPE 17     Perceived Dyspnea  0     VO2 Peak 12.97     Symptoms Yes (comment)     Comments Hip /low back pain 6/10 - herniated disc and leg fatigue     Resting HR 90 bpm     Resting BP 100/60     Max Ex. HR 111 bpm     Max Ex. BP 106/62            Oxygen Initial Assessment:   Oxygen Re-Evaluation:   Oxygen Discharge (Final Oxygen Re-Evaluation):   Initial Exercise Prescription:   Perform Capillary Blood Glucose checks as needed.  Exercise Prescription Changes:  Exercise Prescription Changes    Row Name 01/05/20 1300 01/20/20 1300 02/03/20 1500 02/16/20 1500 03/03/20 1300     Response to Exercise   Blood Pressure (Admit) 134/70 122/72 128/82 124/70 110/74   Blood Pressure (Exercise) 136/64 144/80 142/82 136/64 132/68   Blood Pressure (Exit) 116/60 108/64 102/78 134/64 122/62   Heart Rate (Admit) 97 bpm 86 bpm 95 bpm 104 bpm 93 bpm   Heart Rate (Exercise) 121 bpm 135 bpm 127 bpm 130 bpm 116 bpm   Heart Rate (Exit) 105 bpm 103 bpm 89 bpm 107 bpm 91 bpm   Rating of Perceived Exertion (Exercise) '13 15 17 13 12   '$ Symptoms -- -- -- hip pain --   Duration Continue with 30 min of aerobic exercise without signs/symptoms of physical distress. Continue with 30 min of aerobic exercise  without signs/symptoms of physical distress. Continue with 30 min of aerobic exercise without signs/symptoms of physical distress. Continue with 30 min of aerobic exercise without signs/symptoms of physical distress. Continue with 30 min of aerobic exercise without signs/symptoms of physical distress.   Intensity THRR unchanged THRR unchanged THRR unchanged THRR unchanged THRR unchanged     Progression   Progression Continue to progress workloads to maintain intensity without signs/symptoms of physical distress. Continue to progress workloads to maintain intensity without signs/symptoms of  physical distress. Continue to progress workloads to maintain intensity without signs/symptoms of physical distress. Continue to progress workloads to maintain intensity without signs/symptoms of physical distress. Continue to progress workloads to maintain intensity without signs/symptoms of physical distress.   Average METs 3.25 3.13 2.6 2.7 3.23     Resistance Training   Training Prescription Yes Yes Yes Yes --   Weight 8 lb 8 lb 8 lb 8 lb --   Reps 10-15 10-15 10-15 10-15 --     Interval Training   Interval Training No No No No --     Treadmill   MPH 2.3 2.3 1.9 2.3 --   Grade 0 0 0 0 --   Minutes '15 15 15 15 '$ --   METs 2.69 2.76 -- 2.76 --     REL-XR   Level 4 4 -- -- --   Minutes 15 15 -- -- --   METs 3.8 3.5 -- -- --     T5 Nustep   Level -- -- 5 -- --   SPM -- -- 80 -- --   Minutes -- -- 15 -- --   METs -- -- 2.5 -- --     Home Exercise Plan   Plans to continue exercise at -- Home (comment)  walking Home (comment)  walking Home (comment)  walking --   Frequency -- Add 3 additional days to program exercise sessions. Add 3 additional days to program exercise sessions. Add 3 additional days to program exercise sessions. --   Initial Home Exercises Provided -- 12/10/19 12/10/19 12/10/19 --   Row Name 03/15/20 1600 03/28/20 1700 04/12/20 1600 04/28/20 1300 05/09/20 1400     Response to  Exercise   Blood Pressure (Admit) 110/78 110/64 138/80 118/62 120/74   Blood Pressure (Exercise) 140/78 124/72 130/88 130/82 134/74   Blood Pressure (Exit) 106/68 108/60 126/54 122/80 120/84   Heart Rate (Admit) 79 bpm 67 bpm 89 bpm 82 bpm 93 bpm   Heart Rate (Exercise) 109 bpm 113 bpm 147 bpm 116 bpm 126 bpm   Heart Rate (Exit) 88 bpm -- 93 bpm 96 bpm 90 bpm   Rating of Perceived Exertion (Exercise) '17 14 15 13 16   '$ Symptoms hip pain -- none none none   Duration Continue with 30 min of aerobic exercise without signs/symptoms of physical distress. Continue with 30 min of aerobic exercise without signs/symptoms of physical distress. Continue with 30 min of aerobic exercise without signs/symptoms of physical distress. Continue with 30 min of aerobic exercise without signs/symptoms of physical distress. Continue with 30 min of aerobic exercise without signs/symptoms of physical distress.   Intensity THRR unchanged THRR unchanged THRR unchanged THRR unchanged THRR unchanged     Progression   Progression Continue to progress workloads to maintain intensity without signs/symptoms of physical distress. Continue to progress workloads to maintain intensity without signs/symptoms of physical distress. Continue to progress workloads to maintain intensity without signs/symptoms of physical distress. Continue to progress workloads to maintain intensity without signs/symptoms of physical distress. Continue to progress workloads to maintain intensity without signs/symptoms of physical distress.   Average METs 3.02 3.83 2.67 3.15 4.33     Resistance Training   Training Prescription Yes Yes Yes Yes Yes   Weight 8 lb 8 lb 8 lb 8 lb 8 lb   Reps 10-15 10-15 10-15 10-15 10-15     Interval Training   Interval Training No No No No Yes   Equipment -- -- -- -- Treadmill  Comments -- -- -- -- 1 min with grade increase to 4%     Treadmill   MPH 2.3 2.4 2.4 2.4 2.4   Grade 0 0 0 0 4   Minutes '15 15 15 15 15    '$ METs 2.76 2.76 2.84 2.84 4.13     REL-XR   Level 5 5 -- 5 5   Minutes 15 15 -- 15 15   METs 3.9 4.9 -- 4 4.5     T5 Nustep   Level 5 -- 5 3 --   Minutes 15 -- 15 15 --   METs 2.4 -- 2.5 2.6 --     Home Exercise Plan   Plans to continue exercise at Home (comment)  walking -- Home (comment)  walking Home (comment)  walking Home (comment)  walking   Frequency Add 3 additional days to program exercise sessions. -- Add 3 additional days to program exercise sessions. Add 3 additional days to program exercise sessions. Add 3 additional days to program exercise sessions.   Initial Home Exercises Provided 12/10/19 -- 12/10/19 12/10/19 12/10/19   Row Name 06/21/20 1300             Response to Exercise   Blood Pressure (Admit) 100/90       Blood Pressure (Exercise) 106/62       Blood Pressure (Exit) 110/68       Heart Rate (Admit) 57 bpm       Heart Rate (Exercise) 111 bpm       Heart Rate (Exit) 88 bpm       Rating of Perceived Exertion (Exercise) 17       Symptoms none       Duration Continue with 30 min of aerobic exercise without signs/symptoms of physical distress.       Intensity THRR unchanged         Progression   Progression Continue to progress workloads to maintain intensity without signs/symptoms of physical distress.       Average METs 2.7         Resistance Training   Training Prescription Yes       Weight 8 lb       Reps 10-15         Interval Training   Interval Training Yes       Equipment Treadmill       Comments 1 min with grade increase to 4%         Treadmill   MPH 2.4       Grade 4       Minutes 15       METs 4.13         REL-XR   Level 5       Watts 50       Minutes 15       METs 3              Exercise Comments:  Exercise Comments    Row Name 01/19/20 1025 01/19/20 1118         Exercise Comments Jaaron is experiencing left shoulder discomfort.  Thinks he pulled a muscle. Movement of shoulder creates discomfort.  Advised to call MD if  this continues or gets worse. Also advised not to use left arm to hard today. He verbalized understanding. Derald stated he did fine today, no further discomfort while exercising.             Exercise Goals and Review:   Exercise  Goals Re-Evaluation :  Exercise Goals Re-Evaluation    Row Name 01/05/20 1308 01/20/20 1344 02/03/20 1552 02/16/20 1525 02/23/20 1024     Exercise Goal Re-Evaluation   Exercise Goals Review Increase Physical Activity;Increase Strength and Stamina;Able to understand and use rate of perceived exertion (RPE) scale;Able to understand and use Dyspnea scale;Knowledge and understanding of Target Heart Rate Range (THRR);Able to check pulse independently;Understanding of Exercise Prescription Increase Physical Activity;Increase Strength and Stamina;Understanding of Exercise Prescription Increase Physical Activity;Increase Strength and Stamina;Able to understand and use rate of perceived exertion (RPE) scale;Able to understand and use Dyspnea scale;Knowledge and understanding of Target Heart Rate Range (THRR);Able to check pulse independently;Understanding of Exercise Prescription Increase Physical Activity;Increase Strength and Stamina;Understanding of Exercise Prescription Increase Physical Activity;Increase Strength and Stamina;Understanding of Exercise Prescription   Comments Ron has increased to 8 ob weights fro strength work and lincreased TM speed.  He is exercsising some at home.  Staff will monitor progress. Ron continues to do well in rehab.  Yesterday we limited his arms as he had been having some shoulder pain that seemed muscular in nature.  He is up to level 4 on the XR.  We will continue to monitor his progress. Ron has only attended twice in March.  Consistent exercise will yield more progress. Ron has only attended once since last review (3 total for March).  He is able to come in to complete exercise but lacks progression due to lack of consistent attendance.  We will  continue to monitor his progress. 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   Expected Outcomes -- Short: Add incline to treadmill if hip able to tolerate.  Long: Continue to improve stamina. Short : attend/exercise consistently Long: improve overall MET level Short: Attend regularly  Long: Continue to improve stamina. Short: Attend regularly  Long: Continue to improve stamina.   [place] Name 03/03/20 1325 03/15/20 1605 03/28/20 1706 03/29/20 1121 04/12/20 1614     Exercise Goal Re-Evaluation   Exercise Goals Review Increase Physical Activity;Increase Strength and Stamina;Able to understand and use rate of perceived exertion (RPE) scale;Able to understand and use Dyspnea scale;Knowledge and understanding of Target Heart Rate Range (THRR);Able to check pulse independently;Understanding of Exercise Prescription Increase Physical Activity;Increase Strength and Stamina;Understanding of Exercise Prescription Increase Physical Activity;Increase Strength and Stamina;Able to understand and use rate of perceived exertion (RPE) scale;Able to understand and use Dyspnea scale;Knowledge and understanding of Target Heart Rate Range (THRR);Able to check pulse independently;Understanding of Exercise Prescription Increase Physical Activity;Increase Strength and Stamina;Understanding of Exercise Prescription Increase Physical Activity;Increase Strength and Stamina;Understanding of Exercise Prescription   Comments Ron has not had regular attendance.  He is active outside program sessions Ron continues to have poor attendance and only attended twice since last review.  He would greatly benefit from improved attendance.  He was able to use the T5 NuStep today for the full 30 min.  We will continue to monitor his progress Ron has changed to a different time slot that should allow him to attend more consistently. Ron is doing well in rehab.  He is walking at home.   He has also a star roller now that he is using on his hip. He has also gotten a TENs unit to use.  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  Overall, he is feeling stronger and has more stamina.  Ron has been doing well in rehab. His attendance has improved some, but could still be better.  His progression would be better with improved attendance.  He is up to level 5 on the T5 NuStep.  We will continue to monitor his progress.   Expected Outcomes Short : attend regulalry Long: increase overall MET level Short: Improved attendance  Long; Continue to improve stamina Short: attend regularly Long: increase stamina Short: Try out TENs unit on hip  Long: Conintue to improve stamina. Short: Improve attendance Long: Continue to improve stamina.   Row Name 04/28/20 1326 05/09/20 1452 06/06/20 1355 06/14/20 1112 06/21/20 1302     Exercise Goal Re-Evaluation   Exercise Goals Review Increase Physical Activity;Increase Strength and Stamina;Understanding of Exercise Prescription Increase Physical Activity;Increase Strength and Stamina;Understanding of Exercise Prescription -- Increase Physical Activity;Increase Strength and Stamina;Understanding of Exercise Prescription Increase Physical Activity;Increase Strength and Stamina;Understanding of Exercise Prescription   Comments Ron continues to well in rehab.  He missed today's visit.  He is doing well with the XR and is up to level 5.  We will continue to monitor his progression. Ron has been doing well in rehab.  He is now up to doing 4% grade on treadmill with intervals.  We will continue to monitor his progress. Out since last review.  Last session attended 05/12/20 Ferne Reus is doing well in rehab. He is walking some at home, but he has had a problem with feeling tight in his back body.  We talked about trying yoga to stretch more.  He has been using his TENs unit and roller. --   Expected Outcomes Short: Continue to  attend regularly  Long: Continue to improve strength and stamina. Short: Continue to use intervals Long; Continue to improve stamina. -- Short: Try yoga  Long: Continue to improve stamina. --   Row Name 06/21/20 1303             Exercise Goal Re-Evaluation   Exercise Goals Review Increase Physical Activity;Increase Strength and Stamina;Understanding of Exercise Prescription       Comments Ron imporved his by 190 feet or 15%       Expected Outcomes Short: complete HT program Long: maintain exercise on his own              Discharge Exercise Prescription (Final Exercise Prescription Changes):  Exercise Prescription Changes - 06/21/20 1300      Response to Exercise   Blood Pressure (Admit) 100/90    Blood Pressure (Exercise) 106/62    Blood Pressure (Exit) 110/68    Heart Rate (Admit) 57 bpm    Heart Rate (Exercise) 111 bpm    Heart Rate (Exit) 88 bpm    Rating of Perceived Exertion (Exercise) 17    Symptoms none    Duration Continue with 30 min of aerobic exercise without signs/symptoms of physical distress.    Intensity THRR unchanged      Progression   Progression Continue to progress workloads to maintain intensity without signs/symptoms of physical distress.    Average METs 2.7      Resistance Training   Training Prescription Yes    Weight 8 lb    Reps 10-15      Interval Training   Interval Training Yes    Equipment Treadmill    Comments 1 min with grade increase to 4%      Treadmill   MPH 2.4    Grade 4    Minutes 15    METs  4.13      REL-XR   Level 5    Watts 50    Minutes 15    METs 3           Nutrition:  Target Goals: Understanding of nutrition guidelines, daily intake of sodium '1500mg'$ , cholesterol '200mg'$ , calories 30% from fat and 7% or less from saturated fats, daily to have 5 or more servings of fruits and vegetables.  Education: Controlling Sodium/Reading Food Labels -Group verbal and written material supporting the discussion of  sodium use in heart healthy nutrition. Review and explanation with models, verbal and written materials for utilization of the food label.   Education: General Nutrition Guidelines/Fats and Fiber: -Group instruction provided by verbal, written material, models and posters to present the general guidelines for heart healthy nutrition. Gives an explanation and review of dietary fats and fiber.   Biometrics:    Nutrition Therapy Plan and Nutrition Goals:  Nutrition Therapy & Goals - 04/19/20 0719      Nutrition Therapy   Diet Low Na, heart healthy, diabetes friendly    Protein (specify units) 85g    Fiber 30 grams    Whole Grain Foods 3 servings    Saturated Fats 12 max. grams    Fruits and Vegetables 5 servings/day    Sodium 1.5 grams      Personal Nutrition Goals   Nutrition Goal ST: do not eat"naked" carbohydrates, eat whole foods instead of smoothies LT: lower BG    Comments Pt reports his BG is typicalyy aroung 250 and he got as high as 450. Pt reports he is being monitored for medication management. Pt eats watermelon, smoothie, oatmeal, or pancakes for breakfast. Discussed diabetes friendly eating extensively. Emphasized eating protein, fiber, and fat with meals to slow BG absorption.  Based on BG and diet believe his high blood sugar is not diet related, however, adjusting his diet might help manage it better. He needs medication management to prevent such high BG - pt is on insulin.      Intervention Plan   Intervention Prescribe, educate and counsel regarding individualized specific dietary modifications aiming towards targeted core components such as weight, hypertension, lipid management, diabetes, heart failure and other comorbidities.;Nutrition handout(s) given to patient.    Expected Outcomes Short Term Goal: Understand basic principles of dietary content, such as calories, fat, sodium, cholesterol and nutrients.;Short Term Goal: A plan has been developed with personal  nutrition goals set during dietitian appointment.;Long Term Goal: Adherence to prescribed nutrition plan.           Nutrition Assessments:   MEDIFICTS Score Key:          ?70 Need to make dietary changes          40-70 Heart Healthy Diet         ? 40 Therapeutic Level Cholesterol Diet  Nutrition Goals Re-Evaluation:  Nutrition Goals Re-Evaluation    [place] Name 03/29/20 1135 04/19/20 1145 06/14/20 1118         Goals   Nutrition Goal Find healthy non-perishable snacks -- Heart Healthy, no salt     Comment Short: Start stocking snacks in car  Long: Continue to work towards [employer] Ron does keep snacks in his car - peanut butter heavy trail mix. Ron is still working on his diet.  He is watching sodium closely as well as carbs and sugar.  He is trying to make better choices. He has been trying to eat more fruit recently.  Expected Outcome Short: Start stocking snacks in car  Long: Continue to work towards healthy diet -- Short: Continue make healthier choice Long: Continue to focus on heart healthy diet.            Nutrition Goals Discharge (Final Nutrition Goals Re-Evaluation):  Nutrition Goals Re-Evaluation - 06/14/20 1118      Goals   Nutrition Goal Heart Healthy, no salt    Comment Ron is still working on his diet.  He is watching sodium closely as well as carbs and sugar.  He is trying to make better choices. He has been trying to eat more fruit recently.    Expected Outcome Short: Continue make healthier choice Long: Continue to focus on heart healthy diet.           Psychosocial: Target Goals: Acknowledge presence or absence of significant depression and/or stress, maximize coping skills, provide positive support system. Participant is able to verbalize types and ability to use techniques and skills needed for reducing stress and depression.   Education: Depression - Provides group verbal and written instruction on the correlation between heart/lung disease and  depressed mood, treatment options, and the stigmas associated with seeking treatment.   Education: Sleep Hygiene -Provides group verbal and written instruction about how sleep can affect your health.  Define sleep hygiene, discuss sleep cycles and impact of sleep habits. Review good sleep hygiene tips.     Education: Stress and Anxiety: - Provides group verbal and written instruction about the health risks of elevated stress and causes of high stress.  Discuss the correlation between heart/lung disease and anxiety and treatment options. Review healthy ways to manage with stress and anxiety.    Initial Review & Psychosocial Screening:   Quality of Life Scores:   Scores of 19 and below usually indicate a poorer quality of life in these areas.  A difference of  2-3 points is a clinically meaningful difference.  A difference of 2-3 points in the total score of the Quality of Life Index has been associated with significant improvement in overall quality of life, self-image, physical symptoms, and general health in studies assessing change in quality of life.  PHQ-9: Recent Review Flowsheet Data    Depression screen [facility] 2/9 03/24/2020 11/24/2019 05/26/2018 04/26/2014   Decreased Interest 0 0 0 0   Down, Depressed, Hopeless 0 0 0 0   PHQ - 2 Score 0 0 0 0   Altered sleeping 0 - - -   Tired, decreased energy 0 - - -   Change in appetite 0 - - -   Feeling bad or failure about yourself  0 - - -   Trouble concentrating 0 - - -   Moving slowly or fidgety/restless 0 - - -   Suicidal thoughts 0 - - -   PHQ-9 Score 0 - - -   Difficult doing work/chores Not difficult at all - - -     Interpretation of Total Score  Total Score Depression Severity:  1-4 = Minimal depression, 5-9 = Mild depression, 10-14 = Moderate depression, 15-19 = Moderately severe depression, 20-27 = Severe depression   Psychosocial Evaluation and Intervention:   Psychosocial Re-Evaluation:  Psychosocial Re-Evaluation    Row  Name 02/23/20 1029 03/29/20 1127 04/19/20 1143 06/14/20 1115       Psychosocial Re-Evaluation   Current issues with Current Stress Concerns Current Stress Concerns Current Stress Concerns Current Stress Concerns    Comments Pt reports stress levels are fine and he  is doing well with his job and at home. Pt reports hvaing a good support system. Pt reports still sleeping well. Pt reports having pain all the time; neuropathy and herniated disc and pinched nerve and shoulder pain. Pt reports doctor wants him to get surgery, but with evewrthing else has not done it. Ron is doing well in rehab.  He has started to get some tools to use at home to help with pain levels.  He is doing well mentally.  He continues to sleep well for the most part.  His pain in hip and shoulder can interrupt his sleep some. Ron states he doesnt have stress.  He sleeps well most of the time.  He does have back pain that can only be resolved with surgery. Ron is doing well in rehab.  He will need to graduate before the end of next month.  His family just finished up echos and Marfan's screenings and so far everyone is doing well.  He is sleeping pretty good other than trips to bathroom.  Right side sleeping wakes him with his nerve pain.    Expected Outcomes Short: Continue to attend to build hip strength.  Long: Continue to cope positively Short: Continue to attend to build hip strength.  Long: Continue to cope positively Short: Continue to attend to build hip strength.  Long: Continue to cope positively Short: Enjoy peace that family is good. Long: COntinue to stay positive.    Interventions Encouraged to attend Cardiac Rehabilitation for the exercise Encouraged to attend Cardiac Rehabilitation for the exercise Encouraged to attend Cardiac Rehabilitation for the exercise Encouraged to attend Cardiac Rehabilitation for the exercise    Continue Psychosocial Services  Follow up required by staff -- -- Follow up required by staff            Psychosocial Discharge (Final Psychosocial Re-Evaluation):  Psychosocial Re-Evaluation - 06/14/20 1115      Psychosocial Re-Evaluation   Current issues with Current Stress Concerns    Comments Ron is doing well in rehab.  He will need to graduate before the end of next month.  His family just finished up echos and Marfan's screenings and so far everyone is doing well.  He is sleeping pretty good other than trips to bathroom.  Right side sleeping wakes him with his nerve pain.    Expected Outcomes Short: Enjoy peace that family is good. Long: COntinue to stay positive.    Interventions Encouraged to attend Cardiac Rehabilitation for the exercise    Continue Psychosocial Services  Follow up required by staff           Vocational Rehabilitation: Provide vocational rehab assistance to qualifying candidates.   Vocational Rehab Evaluation & Intervention:   Education: Education Goals: Education classes will be provided on a variety of topics geared toward better understanding of heart health and risk factor modification. Participant will state understanding/return demonstration of topics presented as noted by education test scores.  Learning Barriers/Preferences:   General Cardiac Education Topics:  AED/CPR: - Group verbal and written instruction with the use of models to demonstrate the basic use of the AED with the basic ABC's of resuscitation.   Anatomy & Physiology of the Heart: - Group verbal and written instruction and models provide basic cardiac anatomy and physiology, with the coronary electrical and arterial systems. Review of Valvular disease and Heart Failure   Cardiac Procedures: - Group verbal and written instruction to review commonly prescribed medications for heart disease. Reviews the medication, class  of the drug, and side effects. Includes the steps to properly store meds and maintain the prescription regimen. (beta blockers and nitrates)   Cardiac  Medications I: - Group verbal and written instruction to review commonly prescribed medications for heart disease. Reviews the medication, class of the drug, and side effects. Includes the steps to properly store meds and maintain the prescription regimen.   Cardiac Rehab from 06/16/2020 in [facility] Cardiac and Pulmonary Rehab  Date 06/16/20  Educator SB  Instruction Review Code 1- Verbalizes Understanding      Cardiac Medications II: -Group verbal and written instruction to review commonly prescribed medications for heart disease. Reviews the medication, class of the drug, and side effects. (all other drug classes)    Go Sex-Intimacy & Heart Disease, Get SMART - Goal Setting: - Group verbal and written instruction through game format to discuss heart disease and the return to sexual intimacy. Provides group verbal and written material to discuss and apply goal setting through the application of the S.M.A.R.T. Method.   Other Matters of the Heart: - Provides group verbal, written materials and models to describe Stable Angina and Peripheral Artery. Includes description of the disease process and treatment options available to the cardiac patient.   Infection Prevention: - Provides verbal and written material to individual with discussion of infection control including proper hand washing and proper equipment cleaning during exercise session.   Falls Prevention: - Provides verbal and written material to individual with discussion of falls prevention and safety.   Other: -Provides group and verbal instruction on various topics (see comments)   Knowledge Questionnaire Score:   Core Components/Risk Factors/Patient Goals at Admission:   Education:Diabetes - Individual verbal and written instruction to review signs/symptoms of diabetes, desired ranges of glucose level fasting, after meals and with exercise. Acknowledge that pre and post exercise glucose checks will be done for 3  sessions at entry of program.   Education: Know Your Numbers and Risk Factors: -Group verbal and written instruction about important numbers in your health.  Discussion of what are risk factors and how they play a role in the disease process.  Review of Cholesterol, Blood Pressure, Diabetes, and BMI and the role they play in your overall health.   Core Components/Risk Factors/Patient Goals Review:   Goals and Risk Factor Review    Row Name 02/23/20 1026 03/29/20 1128 04/19/20 1140 06/14/20 1116       Core Components/Risk Factors/Patient Goals Review   Personal Goals Review Weight Management/Obesity;Hypertension;Heart Failure;Diabetes Weight Management/Obesity;Hypertension;Heart Failure;Diabetes -- Weight Management/Obesity;Hypertension;Heart Failure;Diabetes    Review Pt has reported increased weight, does not feel like it is muscle mass. Pt reports still on insulin, blood sugar varyies 300 -->70 without doing anything differently. not feeling lightheaded when stands anymore. Ron is doing well in rehab.  His weight has been creeping up again.  His lowest in past few years was 198 lb and he wants to get back to 215-220 lb.  He has gotten back to eating out again.  We talked about trying to plan ahead some and then looking for snacks that are non-perishable.  Ron is still watching his blood sugars but his wife broke his needle.  He is going to get a new one.  He is still having some chest pressure from the heart failure but still has not talked to doctor about it still going on.  We talked about sending doc a message.  He doing well with his pressures and still checks them at home.  Ron did get a new needle for checking BG.  His BG has been high at times - he plans to call today.  He reports taking all meds as directed.  He did talk to his Dr about chest pressure - Dr had him take meds at specific times.  He sees Dr this Friday for follow up. Ron is doing well in rehab. His weight has been steady, but he  would like to lose some more weight.  He usually stays within 5 lb.  He does not have any worsening heart failure symptoms.  He is now up to 40-45% EF.   He is feeling pretty good overall.  His pressures have continued to do well.    Expected Outcomes Short: Continue to work on strength Long: Continue to monitor risk factors. Short: Send doctor note about chest pain and get new needle for diabetes check.  Long: Continue to manage risk factors. Short: foolow up with Dr Jacqulyn Bath: manage risk factors Short: Continue to work on weight loss Long: Continue to monitor risk factors.           Core Components/Risk Factors/Patient Goals at Discharge (Final Review):   Goals and Risk Factor Review - 06/14/20 1116      Core Components/Risk Factors/Patient Goals Review   Personal Goals Review Weight Management/Obesity;Hypertension;Heart Failure;Diabetes    Review Ron is doing well in rehab. His weight has been steady, but he would like to lose some more weight.  He usually stays within 5 lb.  He does not have any worsening heart failure symptoms.  He is now up to 40-45% EF.   He is feeling pretty good overall.  His pressures have continued to do well.    Expected Outcomes Short: Continue to work on weight loss Long: Continue to monitor risk factors.           ITP Comments:  ITP Comments    Row Name 01/13/20 0629 01/19/20 1022 01/19/20 1117 02/10/20 0633 02/16/20 1524   ITP Comments 30 day chart review completed. ITP sent to Dr Corrin Parker Medical Director, for review,changes as needed and signature. Kaelen is experiencing left shoulder discomfort.  Thinks he pulled a muscle. Movement of shoulder creates discomfort.  Advised to call MD if this continues or gets worse. Also advised not to use left arm to hard today. He verbalized understanding. Norman stated he did fine today, no further discomfort while exercising. 30 day chart review completed. ITP sent to Dr Corrin Parker Medical Director, for  review,changes as needed and signature. Continue with ITP if no changes requested Ron's attendance has been spotted, only attended 3 times this month for various reasons.   Row Name 03/09/20 5726 03/17/20 1429 04/06/20 0614 04/07/20 1312 05/04/20 0601   ITP Comments 30 Day review completed. Medical Director review done, changes made as directed,and approval shown by signature of Wellsite geologist. Called to check on Ron.  Left message about lack of attendance and request for improved attendance to maintain spot or look into possible virtual program.  Sent letter. 30 Day review completed. ITP review done, changes made as directed,and approval shown by signature of  Secondary school teacher. Gennie Alma Trouten did not complete his rehab session.  Ron's blood sugar was too high to exercise today and he was symptomatic with dizziness and lightheadedness. He was treated with some water and allowed to recover before sending him home. 30 Day review completed. Medical Director ITP review done, changes made as directed, and signed approval by  Medical Director.   [place] Name 05/25/20 1545 06/01/20 0650 06/06/20 1355 06/29/20 0612     ITP Comments Ron has not attended since last review. 30 Day review completed. Medical Director ITP review done, changes made as directed, and signed approval by Medical Director. Out since 6/24.  Was having car problems and appointment conflict last week. 30 Day review completed. Medical Director ITP review done, changes made as directed, and signed approval by Medical Director.           Comments:

## 2020-06-30 ENCOUNTER — Encounter: Payer: Medicare Other | Admitting: *Deleted

## 2020-06-30 ENCOUNTER — Other Ambulatory Visit: Payer: Self-pay

## 2020-06-30 DIAGNOSIS — I5022 Chronic systolic (congestive) heart failure: Secondary | ICD-10-CM

## 2020-06-30 DIAGNOSIS — E119 Type 2 diabetes mellitus without complications: Secondary | ICD-10-CM | POA: Diagnosis not present

## 2020-06-30 DIAGNOSIS — Z79899 Other long term (current) drug therapy: Secondary | ICD-10-CM | POA: Diagnosis not present

## 2020-06-30 DIAGNOSIS — I110 Hypertensive heart disease with heart failure: Secondary | ICD-10-CM | POA: Diagnosis not present

## 2020-06-30 NOTE — Progress Notes (Signed)
Daily Session Note  Patient Details  Name: [name] MRN: [id number] Date of Birth: 12-13-1975 Referring Provider:     Cardiac Rehab from 11/04/2019 in [facility] Cardiac and Pulmonary Rehab  Referring Provider [name]      Encounter Date: 06/30/2020  Check In:  Session Check In - 06/30/20 1112      Check-In   Supervising physician immediately available to respond to emergencies See telemetry face sheet for immediately available ER MD    Location [facility]-Cardiac & Pulmonary Rehab    Staff Present Renita Papa, RN Margurite Auerbach, MS Exercise Physiologist;Jessica Luan Pulling, MA, RCEP, CCRP, CCET;Melissa Brighton RDN, LDN;Susanne Bice, RN, BSN, CCRP    Virtual Visit No    Medication changes reported     No    Fall or balance concerns reported    No    Warm-up and Cool-down Performed on first and last piece of equipment    Resistance Training Performed Yes    VAD Patient? No    PAD/SET Patient? No      Pain Assessment   Currently in Pain? No/denies              Social History   Tobacco Use  Smoking Status Never Smoker  Smokeless Tobacco Never Used    Goals Met:  Independence with exercise equipment Exercise tolerated well No report of cardiac concerns or symptoms Strength training completed today  Goals Unmet:  Not Applicable  Comments: Pt able to follow exercise prescription today without complaint.  Will continue to monitor for progression.    Dr. Emily Filbert is Medical Director for [facility].

## 2020-07-05 ENCOUNTER — Encounter: Payer: Medicare Other | Admitting: *Deleted

## 2020-07-05 ENCOUNTER — Other Ambulatory Visit: Payer: Self-pay

## 2020-07-05 DIAGNOSIS — I5022 Chronic systolic (congestive) heart failure: Secondary | ICD-10-CM

## 2020-07-05 DIAGNOSIS — E119 Type 2 diabetes mellitus without complications: Secondary | ICD-10-CM | POA: Diagnosis not present

## 2020-07-05 DIAGNOSIS — Z79899 Other long term (current) drug therapy: Secondary | ICD-10-CM | POA: Diagnosis not present

## 2020-07-05 DIAGNOSIS — I110 Hypertensive heart disease with heart failure: Secondary | ICD-10-CM | POA: Diagnosis not present

## 2020-07-05 NOTE — Patient Instructions (Addendum)
Discharge Patient Instructions  Patient Details  Name: [name] MRN: [id number] Date of Birth: 1976-01-18 Referring Provider:  Yvonne Kendall, MD   Number of Visits: 81  Reason for Discharge:  Patient reached a stable level of exercise. Patient independent in their exercise.  Smoking History:  Social History   Tobacco Use  Smoking Status Never Smoker  Smokeless Tobacco Never Used    Diagnosis:  Heart failure, chronic systolic (HCC)  Initial Exercise Prescription:   Discharge Exercise Prescription (Final Exercise Prescription Changes):  Exercise Prescription Changes - 07/05/20 1300      Response to Exercise   Blood Pressure (Admit) 122/84    Blood Pressure (Exercise) 128/76    Blood Pressure (Exit) 118/68    Heart Rate (Admit) 105 bpm    Heart Rate (Exercise) 118 bpm    Heart Rate (Exit) 96 bpm    Rating of Perceived Exertion (Exercise) 14    Symptoms none    Duration Continue with 30 min of aerobic exercise without signs/symptoms of physical distress.    Intensity THRR unchanged      Progression   Progression Continue to progress workloads to maintain intensity without signs/symptoms of physical distress.    Average METs 3.25      Resistance Training   Training Prescription Yes    Weight 8 lb    Reps 10-15      Interval Training   Interval Training No      Treadmill   MPH 2.4    Minutes 15    METs 2.84      REL-XR   Level 5    Minutes 15    METs 4.2      T5 Nustep   Level 5    Minutes 15    METs 2.7      Home Exercise Plan   Plans to continue exercise at Home (comment)   walking   Frequency Add 3 additional days to program exercise sessions.    Initial Home Exercises Provided 12/10/19           Functional Capacity:  6 Minute Walk    Row Name 06/16/20 1130         6 Minute Walk   Phase Discharge     Distance 1432 feet     Distance % Change 15 %     Distance Feet Change 190 ft     Walk Time 6 minutes     # of Rest  Breaks 0     MPH 2.7     METS 3.7     RPE 17     Perceived Dyspnea  0     VO2 Peak 12.97     Symptoms Yes (comment)     Comments Hip /low back pain 6/10 - herniated disc and leg fatigue     Resting HR 90 bpm     Resting BP 100/60     Max Ex. HR 111 bpm     Max Ex. BP 106/62              Nutrition & Weight - Outcomes:    Nutrition:  Nutrition Therapy & Goals - 04/19/20 0719      Nutrition Therapy   Diet Low Na, heart healthy, diabetes friendly    Protein (specify units) 85g    Fiber 30 grams    Whole Grain Foods 3 servings    Saturated Fats 12 max. grams    Fruits and Vegetables 5 servings/day  Sodium 1.5 grams      Personal Nutrition Goals   Nutrition Goal ST: do not eat"naked" carbohydrates, eat whole foods instead of smoothies LT: lower BG    Comments Pt reports his BG is typicalyy aroung 250 and he got as high as 450. Pt reports he is being monitored for medication management. Pt eats watermelon, smoothie, oatmeal, or pancakes for breakfast. Discussed diabetes friendly eating extensively. Emphasized eating protein, fiber, and fat with meals to slow BG absorption.  Based on BG and diet believe his high blood sugar is not diet related, however, adjusting his diet might help manage it better. He needs medication management to prevent such high BG - pt is on insulin.      Intervention Plan   Intervention Prescribe, educate and counsel regarding individualized specific dietary modifications aiming towards targeted core components such as weight, hypertension, lipid management, diabetes, heart failure and other comorbidities.;Nutrition handout(s) given to patient.    Expected Outcomes Short Term Goal: Understand basic principles of dietary content, such as calories, fat, sodium, cholesterol and nutrients.;Short Term Goal: A plan has been developed with personal nutrition goals set during dietitian appointment.;Long Term Goal: Adherence to prescribed nutrition plan.              Goals reviewed with patient; copy given to patient.

## 2020-07-05 NOTE — Progress Notes (Signed)
Daily Session Note  Patient Details  Name: [name] MRN: [id number] Date of Birth: February 02, 1976 Referring Provider:     Cardiac Rehab from 11/04/2019 in [facility] Cardiac and Pulmonary Rehab  Referring Provider [name]      Encounter Date: 07/05/2020  Check In:  Session Check In - 07/05/20 1155      Check-In   Supervising physician immediately available to respond to emergencies See telemetry face sheet for immediately available ER MD    Location [facility]-Cardiac & Pulmonary Rehab    Staff Present Heath Lark, RN, BSN, CCRP;Melissa [place] RDN, LDN;Joseph [employer], [place], ACSM CEP, Exercise Physiologist    Virtual Visit No    Medication changes reported     No    Fall or balance concerns reported    No    Warm-up and Cool-down Performed on first and last piece of equipment    Resistance Training Performed Yes    VAD Patient? No    PAD/SET Patient? No      Pain Assessment   Currently in Pain? No/denies              Social History   Tobacco Use  Smoking Status Never Smoker  Smokeless Tobacco Never Used    Goals Met:  Independence with exercise equipment Exercise tolerated well No report of cardiac concerns or symptoms  Goals Unmet:  Not Applicable  Comments: Pt able to follow exercise prescription today without complaint.  Will continue to monitor for progression.    Dr. Emily Filbert is Medical Director for [place] [facility].

## 2020-07-05 NOTE — Progress Notes (Signed)
[name] presents today for a Edex titration.  He is no longer spontaneous erections.  He has had no response to PDE5i's.  He denies any history of multiple myeloma or a history of leukemia.  He does have sickle trait.  He has not taken trazodone or a PDE5i's today.    Patient's left corpus cavernosum is identified.  An area near the base of the penis is cleansed with rubbing alcohol.  Careful to avoid the dorsal vein, 5 mcg of Edex 10 mcg (alprostadil, Lot # [id number] exp # 08/2020) is injected at a 90 degree angle into the left corpus cavernosum near the base of the penis.  Patient experienced a very semi erection in 15 minutes.  I then injected 2 mcg into the right corpus cavernosum using the same technique and patient experienced a firm erection.   He will call this afternoon to report the longevity and satisfaction rate with his erection.  Advised patient of the condition of priapism, painful erection lasting for more than four hours, and to contact the office immediately or seek treatment in the ED

## 2020-07-06 ENCOUNTER — Other Ambulatory Visit: Payer: Self-pay

## 2020-07-06 ENCOUNTER — Encounter: Payer: Self-pay | Admitting: Urology

## 2020-07-06 ENCOUNTER — Telehealth: Payer: Self-pay | Admitting: Urology

## 2020-07-06 ENCOUNTER — Ambulatory Visit (INDEPENDENT_AMBULATORY_CARE_PROVIDER_SITE_OTHER): Payer: Medicare Other | Admitting: Urology

## 2020-07-06 VITALS — BP 118/78 | HR 79 | Ht 73.0 in | Wt 227.0 lb

## 2020-07-06 DIAGNOSIS — N5201 Erectile dysfunction due to arterial insufficiency: Secondary | ICD-10-CM | POA: Diagnosis not present

## 2020-07-06 NOTE — Telephone Encounter (Signed)
Would you call [name] and see what his response was to the Edex injections?

## 2020-07-06 NOTE — Telephone Encounter (Signed)
Pt verbalized he did well with injection and erection is subsiding.

## 2020-07-07 ENCOUNTER — Encounter: Payer: Medicare Other | Admitting: *Deleted

## 2020-07-07 DIAGNOSIS — Z79899 Other long term (current) drug therapy: Secondary | ICD-10-CM | POA: Diagnosis not present

## 2020-07-07 DIAGNOSIS — E119 Type 2 diabetes mellitus without complications: Secondary | ICD-10-CM | POA: Diagnosis not present

## 2020-07-07 DIAGNOSIS — I5022 Chronic systolic (congestive) heart failure: Secondary | ICD-10-CM | POA: Diagnosis not present

## 2020-07-07 DIAGNOSIS — I110 Hypertensive heart disease with heart failure: Secondary | ICD-10-CM | POA: Diagnosis not present

## 2020-07-07 NOTE — Progress Notes (Signed)
Discharge Progress Report  Patient Details  Name: [name] MRN: [id number] Date of Birth: 05-Jun-1976 Referring Provider:     Cardiac Rehab from 11/04/2019 in [facility]  Referring Provider [name]       Number of Visits: 36  Reason for Discharge:  Patient reached a stable level of exercise. Patient independent in their exercise. Patient has met program and personal goals.  Smoking History:  Social History   Tobacco Use  Smoking Status Never Smoker  Smokeless Tobacco Never Used    Diagnosis:  Heart failure, chronic systolic (HCC)  ADL [facility]:   Initial Exercise Prescription:   Discharge Exercise Prescription (Final Exercise Prescription Changes):  Exercise Prescription Changes - 07/05/20 1300      Response to Exercise   Blood Pressure (Admit) 122/84    Blood Pressure (Exercise) 128/76    Blood Pressure (Exit) 118/68    Heart Rate (Admit) 105 bpm    Heart Rate (Exercise) 118 bpm    Heart Rate (Exit) 96 bpm    Rating of Perceived Exertion (Exercise) 14    Symptoms none    Duration Continue with 30 min of aerobic exercise without signs/symptoms of physical distress.    Intensity THRR unchanged      Progression   Progression Continue to progress workloads to maintain intensity without signs/symptoms of physical distress.    Average METs 3.25      Resistance Training   Training Prescription Yes    Weight 8 lb    Reps 10-15      Interval Training   Interval Training No      Treadmill   MPH 2.4    Minutes 15    METs 2.84      REL-XR   Level 5    Minutes 15    METs 4.2      T5 Nustep   Level 5    Minutes 15    METs 2.7      Home Exercise Plan   Plans to continue exercise at Home (comment)   walking   Frequency Add 3 additional days to program exercise sessions.    Initial Home Exercises Provided 12/10/19           Functional Capacity:  6 Minute Walk    Row Name 06/16/20 1130         6 Minute Walk    Phase Discharge     Distance 1432 feet     Distance % Change 15 %     Distance Feet Change 190 ft     Walk Time 6 minutes     # of Rest Breaks 0     MPH 2.7     METS 3.7     RPE 17     Perceived Dyspnea  0     VO2 Peak 12.97     Symptoms Yes (comment)     Comments Hip /low back pain 6/10 - herniated disc and leg fatigue     Resting HR 90 bpm     Resting BP 100/60     Max Ex. HR 111 bpm     Max Ex. BP 106/62             Nutrition & Weight - Outcomes:    Nutrition:  Nutrition Therapy & Goals - 04/19/20 0719      Nutrition Therapy   Diet Low Na, heart healthy, diabetes friendly    Protein (specify units) 85g  Fiber 30 grams    Whole Grain Foods 3 servings    Saturated Fats 12 max. grams    Fruits and Vegetables 5 servings/day    Sodium 1.5 grams      Personal Nutrition Goals   Nutrition Goal ST: do not eat"naked" carbohydrates, eat whole foods instead of smoothies LT: lower BG    Comments Pt reports his BG is typicalyy aroung 250 and he got as high as 450. Pt reports he is being monitored for medication management. Pt eats watermelon, smoothie, oatmeal, or pancakes for breakfast. Discussed diabetes friendly eating extensively. Emphasized eating protein, fiber, and fat with meals to slow BG absorption.  Based on BG and diet believe his high blood sugar is not diet related, however, adjusting his diet might help manage it better. He needs medication management to prevent such high BG - pt is on insulin.      Intervention Plan   Intervention Prescribe, educate and counsel regarding individualized specific dietary modifications aiming towards targeted core components such as weight, hypertension, lipid management, diabetes, heart failure and other comorbidities.;Nutrition handout(s) given to patient.    Expected Outcomes Short Term Goal: Understand basic principles of dietary content, such as calories, fat, sodium, cholesterol and nutrients.;Short Term Goal: A plan has been  developed with personal nutrition goals set during dietitian appointment.;Long Term Goal: Adherence to prescribed nutrition plan.            Goals reviewed with patient; copy given to patient. 

## 2020-07-07 NOTE — Progress Notes (Signed)
Cardiac Individual Treatment Plan  Patient Details  Name: [name] MRN: [id number] Date of Birth: 1976-09-22 Referring Provider:     Cardiac Rehab from 11/04/2019 in [facility] Cardiac and Pulmonary Rehab  Referring Provider [name]      Initial Encounter Date:    Cardiac Rehab from 11/04/2019 in [facility] Cardiac and Pulmonary Rehab  Date 11/04/19      Visit Diagnosis: Heart failure, chronic systolic ([place])  Patient's Home Medications on Admission:  Current Outpatient Medications:  .  AMBULATORY NON FORMULARY MEDICATION, Trimix (30/1/10)-(Pap/Phent/PGE)  Test Dose  70m vial   Qty #3 Refills 0  [facility] [phone] [phone] Disp: 3 mL, Rfl: 0 .  atorvastatin (LIPITOR) 40 MG tablet, Take 1 tablet (40 mg total) by mouth daily at 6 PM., Disp: 90 tablet, Rfl: 0 .  Blood Pressure Monitoring (BLOOD PRESSURE MONITOR/L CUFF) MISC, 1 Units by Does not apply route as needed., Disp: 1 each, Rfl: 0 .  carvedilol (COREG) 6.25 MG tablet, Take 1 tablet (6.25 mg total) by mouth 2 (two) times daily., Disp: 180 tablet, Rfl: 2 .  DULoxetine (CYMBALTA) 20 MG capsule, Take 2 capsules by mouth daily., Disp: , Rfl:  .  NOVOLIN 70/30 FLEXPEN (70-30) 100 UNIT/ML PEN, Inject into the skin 2 (two) times daily. Sliding scale, Disp: , Rfl:  .  pantoprazole (PROTONIX) 40 MG tablet, Take 1 tablet (40 mg total) by mouth daily., Disp: 30 tablet, Rfl: 3 .  potassium chloride SA (KLOR-CON) 20 MEQ tablet, Take 1 tablet (20 mEq total) by mouth 2 (two) times daily., Disp: 60 tablet, Rfl: 2 .  rOPINIRole (REQUIP) 0.25 MG tablet, Take 1 tablet by mouth 3 (three) times daily., Disp: , Rfl:  .  sacubitril-valsartan (ENTRESTO) 24-26 MG, Take 1 tablet by mouth 2 (two) times daily., Disp: 60 tablet, Rfl: 3 .  sildenafil (VIAGRA) 100 MG tablet, Take 1 tablet (100 mg total) by mouth daily as needed for erectile dysfunction. Take two hours prior to intercourse on an empty stomach, Disp: 30 tablet, Rfl: 2 .   torsemide (DEMADEX) 20 MG tablet, Take 1 tablet (20 mg total) by mouth every other day., Disp: 20 tablet, Rfl: 6  Past Medical History: Past Medical History:  Diagnosis Date  . CHF (congestive heart failure) [place]   . CKD (chronic kidney disease), stage III   . Diabetes mellitus   . ED (erectile dysfunction)   . Hypertension     Tobacco Use: Social History   Tobacco Use  Smoking Status Never Smoker  Smokeless Tobacco Never Used    Labs: Recent Review Flowsheet Data    Labs for ITP Cardiac and Pulmonary Rehab Latest Ref Rng & Units 04/25/2011 02/09/2012 05/26/2018 09/14/2019 09/15/2019   Cholestrol 0 - 200 mg/dL - - 177 - 118   LDLCALC 0 - 99 mg/dL - - 104(H) - 60   HDL >40 mg/dL - - 57.00 - 48   Trlycerides <150 mg/dL - - 81.0 - 49   Hemoglobin A1c 4.8 - 5.6 % - - 13.4(A) 7.4(H) 7.5(H)   TCO2 0 - 100 mmol/L 30 29 - - -       Exercise Target Goals: Exercise Program Goal: Individual exercise prescription set using results from initial 6 min walk test and THRR while considering  patient's activity barriers and safety.   Exercise Prescription Goal: Initial exercise prescription builds to 30-45 minutes a day of aerobic activity, 2-3 days per week.  Home exercise guidelines will be given to  patient during program as part of exercise prescription that the participant will acknowledge.   Education: Aerobic Exercise & Resistance Training: - Gives group verbal and written instruction on the various components of exercise. Focuses on aerobic and resistive training programs and the benefits of this training and how to safely progress through these programs..   Cardiac Rehab from 07/07/2020 in [facility] Cardiac and Pulmonary Rehab  Date 07/07/20  Educator [facility]  Instruction Review Code 1- Verbalizes Understanding      Education: Exercise & Equipment Safety: - Individual verbal instruction and demonstration of equipment use and safety with use of the equipment.   Education: Exercise  Physiology & General Exercise Guidelines: - Group verbal and written instruction with models to review the exercise physiology of the cardiovascular system and associated critical values. Provides general exercise guidelines with specific guidelines to those with heart or lung disease.    Education: Flexibility, Balance, Mind/Body Relaxation: Provides group verbal/written instruction on the benefits of flexibility and balance training, including mind/body exercise modes such as yoga, pilates and tai chi.  Demonstration and skill practice provided.   Activity Barriers & Risk Stratification:   6 Minute Walk:  6 Minute Walk    Row Name 06/16/20 1130         6 Minute Walk   Phase Discharge     Distance 1432 feet     Distance % Change 15 %     Distance Feet Change 190 ft     Walk Time 6 minutes     # of Rest Breaks 0     MPH 2.7     METS 3.7     RPE 17     Perceived Dyspnea  0     VO2 Peak 12.97     Symptoms Yes (comment)     Comments Hip /low back pain 6/10 - herniated disc and leg fatigue     Resting HR 90 bpm     Resting BP 100/60     Max Ex. HR 111 bpm     Max Ex. BP 106/62            Oxygen Initial Assessment:   Oxygen Re-Evaluation:   Oxygen Discharge (Final Oxygen Re-Evaluation):   Initial Exercise Prescription:   Perform Capillary Blood Glucose checks as needed.  Exercise Prescription Changes:  Exercise Prescription Changes    Row Name 01/20/20 1300 02/03/20 1500 02/16/20 1500 03/03/20 1300 03/15/20 1600     Response to Exercise   Blood Pressure (Admit) 122/72 128/82 124/70 110/74 110/78   Blood Pressure (Exercise) 144/80 142/82 136/64 132/68 140/78   Blood Pressure (Exit) 108/64 102/78 134/64 122/62 106/68   Heart Rate (Admit) 86 bpm 95 bpm 104 bpm 93 bpm 79 bpm   Heart Rate (Exercise) 135 bpm 127 bpm 130 bpm 116 bpm 109 bpm   Heart Rate (Exit) 103 bpm 89 bpm 107 bpm 91 bpm 88 bpm   Rating of Perceived Exertion (Exercise) 15 17 13 12 17    Symptoms  -- -- hip pain -- hip pain   Duration Continue with 30 min of aerobic exercise without signs/symptoms of physical distress. Continue with 30 min of aerobic exercise without signs/symptoms of physical distress. Continue with 30 min of aerobic exercise without signs/symptoms of physical distress. Continue with 30 min of aerobic exercise without signs/symptoms of physical distress. Continue with 30 min of aerobic exercise without signs/symptoms of physical distress.   Intensity THRR unchanged THRR unchanged THRR unchanged THRR unchanged THRR unchanged  Progression   Progression Continue to progress workloads to maintain intensity without signs/symptoms of physical distress. Continue to progress workloads to maintain intensity without signs/symptoms of physical distress. Continue to progress workloads to maintain intensity without signs/symptoms of physical distress. Continue to progress workloads to maintain intensity without signs/symptoms of physical distress. Continue to progress workloads to maintain intensity without signs/symptoms of physical distress.   Average METs 3.13 2.6 2.7 3.23 3.02     Resistance Training   Training Prescription Yes Yes Yes -- Yes   Weight 8 lb 8 lb 8 lb -- 8 lb   Reps 10-15 10-15 10-15 -- 10-15     Interval Training   Interval Training No No No -- No     Treadmill   MPH 2.3 1.9 2.3 -- 2.3   Grade 0 0 0 -- 0   Minutes 15 15 15  -- 15   METs 2.76 -- 2.76 -- 2.76     REL-XR   Level 4 -- -- -- 5   Minutes 15 -- -- -- 15   METs 3.5 -- -- -- 3.9     T5 Nustep   Level -- 5 -- -- 5   SPM -- 80 -- -- --   Minutes -- 15 -- -- 15   METs -- 2.5 -- -- 2.4     Home Exercise Plan   Plans to continue exercise at Home (comment)  walking Home (comment)  walking Home (comment)  walking -- Home (comment)  walking   Frequency Add 3 additional days to program exercise sessions. Add 3 additional days to program exercise sessions. Add 3 additional days to program exercise  sessions. -- Add 3 additional days to program exercise sessions.   Initial Home Exercises Provided 12/10/19 12/10/19 12/10/19 -- 12/10/19   Row Name 03/28/20 1700 04/12/20 1600 04/28/20 1300 05/09/20 1400 06/21/20 1300     Response to Exercise   Blood Pressure (Admit) 110/64 138/80 118/62 120/74 100/90   Blood Pressure (Exercise) 124/72 130/88 130/82 134/74 106/62   Blood Pressure (Exit) 108/60 126/54 122/80 120/84 110/68   Heart Rate (Admit) 67 bpm 89 bpm 82 bpm 93 bpm 57 bpm   Heart Rate (Exercise) 113 bpm 147 bpm 116 bpm 126 bpm 111 bpm   Heart Rate (Exit) -- 93 bpm 96 bpm 90 bpm 88 bpm   Rating of Perceived Exertion (Exercise) 14 15 13 16 17    Symptoms -- none none none none   Duration Continue with 30 min of aerobic exercise without signs/symptoms of physical distress. Continue with 30 min of aerobic exercise without signs/symptoms of physical distress. Continue with 30 min of aerobic exercise without signs/symptoms of physical distress. Continue with 30 min of aerobic exercise without signs/symptoms of physical distress. Continue with 30 min of aerobic exercise without signs/symptoms of physical distress.   Intensity THRR unchanged THRR unchanged THRR unchanged THRR unchanged THRR unchanged     Progression   Progression Continue to progress workloads to maintain intensity without signs/symptoms of physical distress. Continue to progress workloads to maintain intensity without signs/symptoms of physical distress. Continue to progress workloads to maintain intensity without signs/symptoms of physical distress. Continue to progress workloads to maintain intensity without signs/symptoms of physical distress. Continue to progress workloads to maintain intensity without signs/symptoms of physical distress.   Average METs 3.83 2.67 3.15 4.33 2.7     Resistance Training   Training Prescription Yes Yes Yes Yes Yes   Weight 8 lb 8 lb 8 lb 8 lb  8 lb   Reps 10-15 10-15 10-15 10-15 10-15      Interval Training   Interval Training No No No Yes Yes   Equipment -- -- -- Treadmill Treadmill   Comments -- -- -- 1 min with grade increase to 4% 1 min with grade increase to 4%     Treadmill   MPH 2.4 2.4 2.4 2.4 2.4   Grade 0 0 0 4 4   Minutes $Remove'15 15 15 15 15   'GNqJeOd$ METs 2.76 2.84 2.84 4.13 4.13     REL-XR   Level 5 -- $Re'5 5 5   'gwj$ Watts -- -- -- -- 50   Minutes 15 -- $Rem'15 15 15   'dDtL$ METs 4.9 -- 4 4.5 3     T5 Nustep   Level -- 5 3 -- --   Minutes -- 15 15 -- --   METs -- 2.5 2.6 -- --     Home Exercise Plan   Plans to continue exercise at -- Home (comment)  walking Home (comment)  walking Home (comment)  walking --   Frequency -- Add 3 additional days to program exercise sessions. Add 3 additional days to program exercise sessions. Add 3 additional days to program exercise sessions. --   Initial Home Exercises Provided -- 12/10/19 12/10/19 12/10/19 --   [place] Name 07/05/20 1300             Response to Exercise   Blood Pressure (Admit) 122/84       Blood Pressure (Exercise) 128/76       Blood Pressure (Exit) 118/68       Heart Rate (Admit) 105 bpm       Heart Rate (Exercise) 118 bpm       Heart Rate (Exit) 96 bpm       Rating of Perceived Exertion (Exercise) 14       Symptoms none       Duration Continue with 30 min of aerobic exercise without signs/symptoms of physical distress.       Intensity THRR unchanged         Progression   Progression Continue to progress workloads to maintain intensity without signs/symptoms of physical distress.       Average METs 3.25         Resistance Training   Training Prescription Yes       Weight 8 lb       Reps 10-15         Interval Training   Interval Training No         Treadmill   MPH 2.4       Minutes 15       METs 2.84         REL-XR   Level 5       Minutes 15       METs 4.2         T5 Nustep   Level 5       Minutes 15       METs 2.7         Home Exercise Plan   Plans to continue exercise at Home (comment)  walking        Frequency Add 3 additional days to program exercise sessions.       Initial Home Exercises Provided 12/10/19              Exercise Comments:  Exercise Comments    Row Name 01/19/20 1025 01/19/20 1118 07/07/20 1210  Exercise Comments Vishwa is experiencing left shoulder discomfort.  Thinks he pulled a muscle. Movement of shoulder creates discomfort.  Advised to call MD if this continues or gets worse. Also advised not to use left arm to hard today. He verbalized understanding. Kedron stated he did fine today, no further discomfort while exercising. Nichoals graduated today from  rehab with 36 sessions completed.  Details of the patient's exercise prescription and what He needs to do in order to continue the prescription and progress were discussed with patient.  Patient was given a copy of prescription and goals.  Patient verbalized understanding.  Murphy plans to continue to exercise by exercising at home, walking and playing sports with his children.            Exercise Goals and Review:   Exercise Goals Re-Evaluation :  Exercise Goals Re-Evaluation    Row Name 01/20/20 1344 02/03/20 1552 02/16/20 1525 02/23/20 1024 03/03/20 1325     Exercise Goal Re-Evaluation   Exercise Goals Review Increase Physical Activity;Increase Strength and Stamina;Understanding of Exercise Prescription Increase Physical Activity;Increase Strength and Stamina;Able to understand and use rate of perceived exertion (RPE) scale;Able to understand and use Dyspnea scale;Knowledge and understanding of Target Heart Rate Range (THRR);Able to check pulse independently;Understanding of Exercise Prescription Increase Physical Activity;Increase Strength and Stamina;Understanding of Exercise Prescription Increase Physical Activity;Increase Strength and Stamina;Understanding of Exercise Prescription Increase Physical Activity;Increase Strength and Stamina;Able to understand and use rate of perceived exertion (RPE)  scale;Able to understand and use Dyspnea scale;Knowledge and understanding of Target Heart Rate Range (THRR);Able to check pulse independently;Understanding of Exercise Prescription   Comments Ron continues to do well in rehab.  Yesterday we limited his arms as he had been having some shoulder pain that seemed muscular in nature.  He is up to level 4 on the XR.  We will continue to monitor his progress. Ron has only attended twice in March.  Consistent exercise will yield more progress. Ron has only attended once since last review (3 total for March).  He is able to come in to complete exercise but lacks progression due to lack of consistent attendance.  We will continue to monitor his progress. 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 Ron has not had regular attendance.  He is active outside program sessions   Expected Outcomes Short: Add incline to treadmill if hip able to tolerate.  Long: Continue to improve stamina. Short : attend/exercise consistently Long: improve overall MET level Short: Attend regularly  Long: Continue to improve stamina. Short: Attend regularly  Long: Continue to improve stamina. Short : attend regulalry Long: increase overall MET level   Row Name 03/15/20 1605 03/28/20 1706 03/29/20 1121 04/12/20 1614 04/28/20 1326     Exercise Goal Re-Evaluation   Exercise Goals Review Increase Physical Activity;Increase Strength and Stamina;Understanding of Exercise Prescription Increase Physical Activity;Increase Strength and Stamina;Able to understand and use rate of perceived exertion (RPE) scale;Able to understand and use Dyspnea scale;Knowledge and understanding of Target Heart Rate Range (THRR);Able to check pulse independently;Understanding of Exercise Prescription Increase Physical Activity;Increase Strength and Stamina;Understanding of Exercise Prescription Increase Physical Activity;Increase Strength and  Stamina;Understanding of Exercise Prescription Increase Physical Activity;Increase Strength and Stamina;Understanding of Exercise Prescription   Comments Ron continues to have poor attendance and only attended twice since last review.  He would greatly benefit from improved attendance.  He was able to use the T5 NuStep today for the full 30  min.  We will continue to monitor his progress Ron has changed to a different time slot that should allow him to attend more consistently. Ron is doing well in rehab.  He is walking at home.  He has also a star roller now that he is using on his hip. He has also gotten a TENs unit to use.  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  Overall, he is feeling stronger and has more stamina. Ron has been doing well in rehab. His attendance has improved some, but could still be better.  His progression would be better with improved attendance.  He is up to level 5 on the T5 NuStep.  We will continue to monitor his progress. Ron continues to well in rehab.  He missed today's visit.  He is doing well with the XR and is up to level 5.  We will continue to monitor his progression.   Expected Outcomes Short: Improved attendance  Long; Continue to improve stamina Short: attend regularly Long: increase stamina Short: Try out TENs unit on hip  Long: Conintue to improve stamina. Short: Improve attendance Long: Continue to improve stamina. Short: Continue to attend regularly  Long: Continue to improve strength and stamina.   [place] Name 05/09/20 1452 06/06/20 1355 06/14/20 1112 06/21/20 1302 06/21/20 1303     Exercise Goal Re-Evaluation   Exercise Goals Review Increase Physical Activity;Increase Strength and Stamina;Understanding of Exercise Prescription -- Increase Physical Activity;Increase Strength and Stamina;Understanding of Exercise Prescription Increase Physical Activity;Increase Strength and Stamina;Understanding of Exercise  Prescription Increase Physical Activity;Increase Strength and Stamina;Understanding of Exercise Prescription   Comments Ron has been doing well in rehab.  He is now up to doing 4% grade on treadmill with intervals.  We will continue to monitor his progress. Out since last review.  Last session attended 05/12/20 Chriss Czar is doing well in rehab. He is walking some at home, but he has had a problem with feeling tight in his back body.  We talked about trying yoga to stretch more.  He has been using his TENs unit and roller. -- Ron imporved his 6MWT by 190 feet or 15%   Expected Outcomes Short: Continue to use intervals Long; Continue to improve stamina. -- Short: Try yoga  Long: Continue to improve stamina. -- Short: complete HT program Long: maintain exercise on his own   [place] Name 07/05/20 1334             Exercise Goal Re-Evaluation   Exercise Goals Review Increase Physical Activity;Increase Strength and Stamina;Understanding of Exercise Prescription       Comments Ron is set to graduate at his next visit.  He will continue to exercise and walk on his own after graduation       Expected Outcomes Continue to exercise independently              Discharge Exercise Prescription (Final Exercise Prescription Changes):  Exercise Prescription Changes - 07/05/20 1300      Response to Exercise   Blood Pressure (Admit) 122/84    Blood Pressure (Exercise) 128/76    Blood Pressure (Exit) 118/68    Heart Rate (Admit) 105 bpm    Heart Rate (Exercise) 118 bpm    Heart Rate (Exit) 96 bpm    Rating of Perceived Exertion (Exercise) 14    Symptoms none    Duration Continue with 30 min of aerobic exercise without signs/symptoms of  physical distress.    Intensity THRR unchanged      Progression   Progression Continue to progress workloads to maintain intensity without signs/symptoms of physical distress.    Average METs 3.25      Resistance Training   Training Prescription Yes    Weight 8 lb    Reps 10-15       Interval Training   Interval Training No      Treadmill   MPH 2.4    Minutes 15    METs 2.84      REL-XR   Level 5    Minutes 15    METs 4.2      T5 Nustep   Level 5    Minutes 15    METs 2.7      Home Exercise Plan   Plans to continue exercise at Home (comment)   walking   Frequency Add 3 additional days to program exercise sessions.    Initial Home Exercises Provided 12/10/19           Nutrition:  Target Goals: Understanding of nutrition guidelines, daily intake of sodium '1500mg'$ , cholesterol '200mg'$ , calories 30% from fat and 7% or less from saturated fats, daily to have 5 or more servings of fruits and vegetables.  Education: Controlling Sodium/Reading Food Labels -Group verbal and written material supporting the discussion of sodium use in heart healthy nutrition. Review and explanation with models, verbal and written materials for utilization of the food label.   Education: General Nutrition Guidelines/Fats and Fiber: -Group instruction provided by verbal, written material, models and posters to present the general guidelines for heart healthy nutrition. Gives an explanation and review of dietary fats and fiber.   Biometrics:    Nutrition Therapy Plan and Nutrition Goals:  Nutrition Therapy & Goals - 04/19/20 0719      Nutrition Therapy   Diet Low Na, heart healthy, diabetes friendly    Protein (specify units) 85g    Fiber 30 grams    Whole Grain Foods 3 servings    Saturated Fats 12 max. grams    Fruits and Vegetables 5 servings/day    Sodium 1.5 grams      Personal Nutrition Goals   Nutrition Goal ST: do not eat"naked" carbohydrates, eat whole foods instead of smoothies LT: lower BG    Comments Pt reports his BG is typicalyy aroung 250 and he got as high as 450. Pt reports he is being monitored for medication management. Pt eats watermelon, smoothie, oatmeal, or pancakes for breakfast. Discussed diabetes friendly eating extensively. Emphasized  eating protein, fiber, and fat with meals to slow BG absorption.  Based on BG and diet believe his high blood sugar is not diet related, however, adjusting his diet might help manage it better. He needs medication management to prevent such high BG - pt is on insulin.      Intervention Plan   Intervention Prescribe, educate and counsel regarding individualized specific dietary modifications aiming towards targeted core components such as weight, hypertension, lipid management, diabetes, heart failure and other comorbidities.;Nutrition handout(s) given to patient.    Expected Outcomes Short Term Goal: Understand basic principles of dietary content, such as calories, fat, sodium, cholesterol and nutrients.;Short Term Goal: A plan has been developed with personal nutrition goals set during dietitian appointment.;Long Term Goal: Adherence to prescribed nutrition plan.           Nutrition Assessments:   MEDIFICTS Score Key:          ?68  Need to make dietary changes          40-70 Heart Healthy Diet         ? 40 Therapeutic Level Cholesterol Diet  Nutrition Goals Re-Evaluation:  Nutrition Goals Re-Evaluation    [place] Name 03/29/20 1135 04/19/20 1145 06/14/20 1118         Goals   Nutrition Goal Find healthy non-perishable snacks -- Heart Healthy, no salt     Comment Short: Start stocking snacks in car  Long: Continue to work towards [employer] Ron does keep snacks in his car - peanut butter heavy trail mix. Ron is still working on his diet.  He is watching sodium closely as well as carbs and sugar.  He is trying to make better choices. He has been trying to eat more fruit recently.     Expected Outcome Short: Start stocking snacks in car  Long: Continue to work towards healthy diet -- Short: Continue make healthier choice Long: Continue to focus on heart healthy diet.            Nutrition Goals Discharge (Final Nutrition Goals Re-Evaluation):  Nutrition Goals Re-Evaluation - 06/14/20 1118       Goals   Nutrition Goal Heart Healthy, no salt    Comment Ron is still working on his diet.  He is watching sodium closely as well as carbs and sugar.  He is trying to make better choices. He has been trying to eat more fruit recently.    Expected Outcome Short: Continue make healthier choice Long: Continue to focus on heart healthy diet.           Psychosocial: Target Goals: Acknowledge presence or absence of significant depression and/or stress, maximize coping skills, provide positive support system. Participant is able to verbalize types and ability to use techniques and skills needed for reducing stress and depression.   Education: Depression - Provides group verbal and written instruction on the correlation between heart/lung disease and depressed mood, treatment options, and the stigmas associated with seeking treatment.   Education: Sleep Hygiene -Provides group verbal and written instruction about how sleep can affect your health.  Define sleep hygiene, discuss sleep cycles and impact of sleep habits. Review good sleep hygiene tips.     Education: Stress and Anxiety: - Provides group verbal and written instruction about the health risks of elevated stress and causes of high stress.  Discuss the correlation between heart/lung disease and anxiety and treatment options. Review healthy ways to manage with stress and anxiety.    Initial Review & Psychosocial Screening:   Quality of Life Scores:   Scores of 19 and below usually indicate a poorer quality of life in these areas.  A difference of  2-3 points is a clinically meaningful difference.  A difference of 2-3 points in the total score of the Quality of Life Index has been associated with significant improvement in overall quality of life, self-image, physical symptoms, and general health in studies assessing change in quality of life.  PHQ-9: Recent Review Flowsheet Data    Depression screen [facility] 2/9 03/24/2020 11/24/2019  05/26/2018 04/26/2014   Decreased Interest 0 0 0 0   Down, Depressed, Hopeless 0 0 0 0   PHQ - 2 Score 0 0 0 0   Altered sleeping 0 - - -   Tired, decreased energy 0 - - -   Change in appetite 0 - - -   Feeling bad or failure about yourself  0 - - -  Trouble concentrating 0 - - -   Moving slowly or fidgety/restless 0 - - -   Suicidal thoughts 0 - - -   PHQ-9 Score 0 - - -   Difficult doing work/chores Not difficult at all - - -     Interpretation of Total Score  Total Score Depression Severity:  1-4 = Minimal depression, 5-9 = Mild depression, 10-14 = Moderate depression, 15-19 = Moderately severe depression, 20-27 = Severe depression   Psychosocial Evaluation and Intervention:   Psychosocial Re-Evaluation:  Psychosocial Re-Evaluation    [place] Name 02/23/20 1029 03/29/20 1127 04/19/20 1143 06/14/20 1115       Psychosocial Re-Evaluation   Current issues with Current Stress Concerns Current Stress Concerns Current Stress Concerns Current Stress Concerns    Comments Pt reports stress levels are fine and he is doing well with his job and at home. Pt reports hvaing a good support system. Pt reports still sleeping well. Pt reports having pain all the time; neuropathy and herniated disc and pinched nerve and shoulder pain. Pt reports doctor wants him to get surgery, but with evewrthing else has not done it. Ron is doing well in rehab.  He has started to get some tools to use at home to help with pain levels.  He is doing well mentally.  He continues to sleep well for the most part.  His pain in hip and shoulder can interrupt his sleep some. Ron states he doesnt have stress.  He sleeps well most of the time.  He does have back pain that can only be resolved with surgery. Ron is doing well in rehab.  He will need to graduate before the end of next month.  His family just finished up echos and Marfan's screenings and so far everyone is doing well.  He is sleeping pretty good other than trips to  bathroom.  Right side sleeping wakes him with his nerve pain.    Expected Outcomes Short: Continue to attend to build hip strength.  Long: Continue to cope positively Short: Continue to attend to build hip strength.  Long: Continue to cope positively Short: Continue to attend to build hip strength.  Long: Continue to cope positively Short: Enjoy peace that family is good. Long: COntinue to stay positive.    Interventions Encouraged to attend Cardiac Rehabilitation for the exercise Encouraged to attend Cardiac Rehabilitation for the exercise Encouraged to attend Cardiac Rehabilitation for the exercise Encouraged to attend Cardiac Rehabilitation for the exercise    Continue Psychosocial Services  Follow up required by staff -- -- Follow up required by staff           Psychosocial Discharge (Final Psychosocial Re-Evaluation):  Psychosocial Re-Evaluation - 06/14/20 1115      Psychosocial Re-Evaluation   Current issues with Current Stress Concerns    Comments Ron is doing well in rehab.  He will need to graduate before the end of next month.  His family just finished up echos and Marfan's screenings and so far everyone is doing well.  He is sleeping pretty good other than trips to bathroom.  Right side sleeping wakes him with his nerve pain.    Expected Outcomes Short: Enjoy peace that family is good. Long: COntinue to stay positive.    Interventions Encouraged to attend Cardiac Rehabilitation for the exercise    Continue Psychosocial Services  Follow up required by staff           Vocational Rehabilitation: Provide vocational rehab assistance to qualifying  candidates.   Vocational Rehab Evaluation & Intervention:   Education: Education Goals: Education classes will be provided on a variety of topics geared toward better understanding of heart health and risk factor modification. Participant will state understanding/return demonstration of topics presented as noted by education test  scores.  Learning Barriers/Preferences:   General Cardiac Education Topics:  AED/CPR: - Group verbal and written instruction with the use of models to demonstrate the basic use of the AED with the basic ABC's of resuscitation.   Anatomy & Physiology of the Heart: - Group verbal and written instruction and models provide basic cardiac anatomy and physiology, with the coronary electrical and arterial systems. Review of Valvular disease and Heart Failure   Cardiac Procedures: - Group verbal and written instruction to review commonly prescribed medications for heart disease. Reviews the medication, class of the drug, and side effects. Includes the steps to properly store meds and maintain the prescription regimen. (beta blockers and nitrates)   Cardiac Medications I: - Group verbal and written instruction to review commonly prescribed medications for heart disease. Reviews the medication, class of the drug, and side effects. Includes the steps to properly store meds and maintain the prescription regimen.   Cardiac Rehab from 07/07/2020 in [facility] Cardiac [facility]  Date 06/16/20  Educator SB  Instruction Review Code 1- Verbalizes Understanding      Cardiac Medications II: -Group verbal and written instruction to review commonly prescribed medications for heart disease. Reviews the medication, class of the drug, and side effects. (all other drug classes)    Go Sex-Intimacy & Heart Disease, Get SMART - Goal Setting: - Group verbal and written instruction through game format to discuss heart disease and the return to sexual intimacy. Provides group verbal and written material to discuss and apply goal setting through the application of the S.M.A.R.T. Method.   Other Matters of the Heart: - Provides group verbal, written materials and models to describe Stable Angina and Peripheral Artery. Includes description of the disease process and treatment options available to the cardiac  patient.   Infection Prevention: - Provides verbal and written material to individual with discussion of infection control including proper hand washing and proper equipment cleaning during exercise session.   Falls Prevention: - Provides verbal and written material to individual with discussion of falls prevention and safety.   Other: -Provides group and verbal instruction on various topics (see comments)   Knowledge Questionnaire Score:   Core Components/Risk Factors/Patient Goals at Admission:   Education:Diabetes - Individual verbal and written instruction to review signs/symptoms of diabetes, desired ranges of glucose level fasting, after meals and with exercise. Acknowledge that pre and post exercise glucose checks will be done for 3 sessions at entry of program.   Education: Know Your Numbers and Risk Factors: -Group verbal and written instruction about important numbers in your health.  Discussion of what are risk factors and how they play a role in the disease process.  Review of Cholesterol, Blood Pressure, Diabetes, and BMI and the role they play in your overall health.   Core Components/Risk Factors/Patient Goals Review:   Goals and Risk Factor Review    Row Name 02/23/20 1026 03/29/20 1128 04/19/20 1140 06/14/20 1116       Core Components/Risk Factors/Patient Goals Review   Personal Goals Review Weight Management/Obesity;Hypertension;Heart Failure;Diabetes Weight Management/Obesity;Hypertension;Heart Failure;Diabetes -- Weight Management/Obesity;Hypertension;Heart Failure;Diabetes    Review Pt has reported increased weight, does not feel like it is muscle mass. Pt reports still on insulin,  blood sugar varyies 300 -->70 without doing anything differently. not feeling lightheaded when stands anymore. Ron is doing well in rehab.  His weight has been creeping up again.  His lowest in past few years was 198 lb and he wants to get back to 215-220 lb.  He has gotten back to  eating out again.  We talked about trying to plan ahead some and then looking for snacks that are non-perishable.  Ron is still watching his blood sugars but his wife broke his needle.  He is going to get a new one.  He is still having some chest pressure from the heart failure but still has not talked to doctor about it still going on.  We talked about sending doc a message.  He doing well with his pressures and still checks them at home. Ron did get a new needle for checking BG.  His BG has been high at times - he plans to call today.  He reports taking all meds as directed.  He did talk to his Dr about chest pressure - Dr had him take meds at specific times.  He sees Dr this Friday for follow up. Ron is doing well in rehab. His weight has been steady, but he would like to lose some more weight.  He usually stays within 5 lb.  He does not have any worsening heart failure symptoms.  He is now up to 40-45% EF.   He is feeling pretty good overall.  His pressures have continued to do well.    Expected Outcomes Short: Continue to work on strength Long: Continue to monitor risk factors. Short: Send doctor note about chest pain and get new needle for diabetes check.  Long: Continue to manage risk factors. Short: foolow up with Dr Laverta [place]: manage risk factors Short: Continue to work on weight loss Long: Continue to monitor risk factors.           Core Components/Risk Factors/Patient Goals at Discharge (Final Review):   Goals and Risk Factor Review - 06/14/20 1116      Core Components/Risk Factors/Patient Goals Review   Personal Goals Review Weight Management/Obesity;Hypertension;Heart Failure;Diabetes    Review Ron is doing well in rehab. His weight has been steady, but he would like to lose some more weight.  He usually stays within 5 lb.  He does not have any worsening heart failure symptoms.  He is now up to 40-45% EF.   He is feeling pretty good overall.  His pressures have continued to do well.    Expected  Outcomes Short: Continue to work on weight loss Long: Continue to monitor risk factors.           ITP Comments:  ITP Comments    Row Name 01/13/20 0629 01/19/20 1022 01/19/20 1117 02/10/20 0633 02/16/20 1524   ITP Comments 30 day chart review completed. ITP sent to Dr Zachery Dakins Medical Director, for review,changes as needed and signature. Olga is experiencing left shoulder discomfort.  Thinks he pulled a muscle. Movement of shoulder creates discomfort.  Advised to call MD if this continues or gets worse. Also advised not to use left arm to hard today. He verbalized understanding. Laquentin stated he did fine today, no further discomfort while exercising. 30 day chart review completed. ITP sent to Dr Zachery Dakins Medical Director, for review,changes as needed and signature. Continue with ITP if no changes requested Ron's attendance has been spotted, only attended 3 times this month for various reasons.  [place] Name 03/09/20 [id number] 03/17/20 1429 04/06/20 0614 04/07/20 1312 05/04/20 0601   ITP Comments 30 Day review completed. Medical Director review done, changes made as directed,and approval shown by signature of Market researcher. Called to check on Ron.  Left message about lack of attendance and request for improved attendance to maintain spot or look into possible virtual program.  Sent letter. 30 Day review completed. ITP review done, changes made as directed,and approval shown by signature of  Scientist, research (life sciences). [employer] Mallen did not complete his rehab session.  Ron's blood sugar was too high to exercise today and he was symptomatic with dizziness and lightheadedness. He was treated with some water and allowed to recover before sending him home. 30 Day review completed. Medical Director ITP review done, changes made as directed, and signed approval by Medical Director.   [place] Name 05/25/20 1545 06/01/20 0650 06/06/20 1355 06/29/20 0612 07/07/20 1210   ITP Comments Ron has not attended  since last review. 30 Day review completed. Medical Director ITP review done, changes made as directed, and signed approval by Medical Director. Out since 6/24.  Was having car problems and appointment conflict last week. 30 Day review completed. Medical Director ITP review done, changes made as directed, and signed approval by Medical Director. Lloyde graduated today from  rehab with 36 sessions completed.  Details of the patient's exercise prescription and what He needs to do in order to continue the prescription and progress were discussed with patient.  Patient was given a copy of prescription and goals.  Patient verbalized understanding.  Nakai plans to continue to exercise by exercising at home, walking and playing sports with his children.          Comments: Discharge ITP

## 2020-07-07 NOTE — Progress Notes (Signed)
Daily Session Note  Patient Details  Name: ORLA JOLLIFF MRN: [id number] Date of Birth: 02-Jul-1976 Referring Provider:     Cardiac Rehab from 11/04/2019 in [facility] Cardiac and Pulmonary Rehab  Referring Provider [name]      Encounter Date: 07/07/2020  Check In:  Session Check In - 07/07/20 1208      Check-In   Supervising physician immediately available to respond to emergencies See telemetry face sheet for immediately available ER MD    Location [facility]-Cardiac & Pulmonary Rehab    Staff Present Heath Lark, RN, BSN, CCRP;Melissa Odebolt RDN, LDN;Jessica Remington, MA, RCEP, CCRP, CCET;Joseph [employer], [place], ACSM CEP, Exercise Physiologist    Virtual Visit No    Medication changes reported     No    Fall or balance concerns reported    No    Warm-up and Cool-down Performed on first and last piece of equipment    Resistance Training Performed Yes    VAD Patient? No    PAD/SET Patient? No      Pain Assessment   Currently in Pain? No/denies              Social History   Tobacco Use  Smoking Status Never Smoker  Smokeless Tobacco Never Used    Goals Met:  Independence with exercise equipment Exercise tolerated well Personal goals reviewed No report of cardiac concerns or symptoms  Goals Unmet:  Not Applicable  Comments:  Kazuma graduated today from  rehab with 36 sessions completed.  Details of the patient's exercise prescription and what He needs to do in order to continue the prescription and progress were discussed with patient.  Patient was given a copy of prescription and goals.  Patient verbalized understanding.  Zebulan plans to continue to exercise by exercising at home, walking and playing sports with his children.    Dr. Emily Filbert is Medical Director for [place] and [facility].

## 2020-07-18 ENCOUNTER — Other Ambulatory Visit: Payer: Self-pay

## 2020-07-18 ENCOUNTER — Telehealth: Payer: Self-pay

## 2020-07-18 MED ORDER — POTASSIUM CHLORIDE CRYS ER 20 MEQ PO TBCR: 20.0000 meq | EXTENDED_RELEASE_TABLET | Freq: Two times a day (BID) | ORAL | 1 refills | Status: DC

## 2020-07-18 MED ORDER — ATORVASTATIN CALCIUM 40 MG PO TABS: 40.0000 mg | ORAL_TABLET | Freq: Every day | ORAL | 0 refills | Status: DC

## 2020-07-18 NOTE — Telephone Encounter (Signed)
rx refill provided as needed based on chart.

## 2020-07-20 DIAGNOSIS — R519 Headache, unspecified: Secondary | ICD-10-CM | POA: Diagnosis not present

## 2020-07-20 DIAGNOSIS — M9903 Segmental and somatic dysfunction of lumbar region: Secondary | ICD-10-CM | POA: Diagnosis not present

## 2020-07-20 DIAGNOSIS — M9901 Segmental and somatic dysfunction of cervical region: Secondary | ICD-10-CM | POA: Diagnosis not present

## 2020-07-20 DIAGNOSIS — M5416 Radiculopathy, lumbar region: Secondary | ICD-10-CM | POA: Diagnosis not present

## 2020-07-22 DIAGNOSIS — M5416 Radiculopathy, lumbar region: Secondary | ICD-10-CM | POA: Diagnosis not present

## 2020-07-22 DIAGNOSIS — R519 Headache, unspecified: Secondary | ICD-10-CM | POA: Diagnosis not present

## 2020-07-22 DIAGNOSIS — M9901 Segmental and somatic dysfunction of cervical region: Secondary | ICD-10-CM | POA: Diagnosis not present

## 2020-07-22 DIAGNOSIS — M9903 Segmental and somatic dysfunction of lumbar region: Secondary | ICD-10-CM | POA: Diagnosis not present

## 2020-07-27 DIAGNOSIS — R519 Headache, unspecified: Secondary | ICD-10-CM | POA: Diagnosis not present

## 2020-07-27 DIAGNOSIS — M9901 Segmental and somatic dysfunction of cervical region: Secondary | ICD-10-CM | POA: Diagnosis not present

## 2020-07-27 DIAGNOSIS — M9903 Segmental and somatic dysfunction of lumbar region: Secondary | ICD-10-CM | POA: Diagnosis not present

## 2020-07-27 DIAGNOSIS — M5416 Radiculopathy, lumbar region: Secondary | ICD-10-CM | POA: Diagnosis not present

## 2020-07-28 DIAGNOSIS — M9901 Segmental and somatic dysfunction of cervical region: Secondary | ICD-10-CM | POA: Diagnosis not present

## 2020-07-28 DIAGNOSIS — M9903 Segmental and somatic dysfunction of lumbar region: Secondary | ICD-10-CM | POA: Diagnosis not present

## 2020-07-28 DIAGNOSIS — R519 Headache, unspecified: Secondary | ICD-10-CM | POA: Diagnosis not present

## 2020-07-28 DIAGNOSIS — M5416 Radiculopathy, lumbar region: Secondary | ICD-10-CM | POA: Diagnosis not present

## 2020-07-29 DIAGNOSIS — M9901 Segmental and somatic dysfunction of cervical region: Secondary | ICD-10-CM | POA: Diagnosis not present

## 2020-07-29 DIAGNOSIS — M5416 Radiculopathy, lumbar region: Secondary | ICD-10-CM | POA: Diagnosis not present

## 2020-07-29 DIAGNOSIS — M9903 Segmental and somatic dysfunction of lumbar region: Secondary | ICD-10-CM | POA: Diagnosis not present

## 2020-07-29 DIAGNOSIS — R519 Headache, unspecified: Secondary | ICD-10-CM | POA: Diagnosis not present

## 2020-08-01 DIAGNOSIS — M5416 Radiculopathy, lumbar region: Secondary | ICD-10-CM | POA: Diagnosis not present

## 2020-08-01 DIAGNOSIS — R519 Headache, unspecified: Secondary | ICD-10-CM | POA: Diagnosis not present

## 2020-08-01 DIAGNOSIS — M9903 Segmental and somatic dysfunction of lumbar region: Secondary | ICD-10-CM | POA: Diagnosis not present

## 2020-08-01 DIAGNOSIS — M9901 Segmental and somatic dysfunction of cervical region: Secondary | ICD-10-CM | POA: Diagnosis not present

## 2020-08-03 DIAGNOSIS — R519 Headache, unspecified: Secondary | ICD-10-CM | POA: Diagnosis not present

## 2020-08-03 DIAGNOSIS — M5416 Radiculopathy, lumbar region: Secondary | ICD-10-CM | POA: Diagnosis not present

## 2020-08-03 DIAGNOSIS — M9901 Segmental and somatic dysfunction of cervical region: Secondary | ICD-10-CM | POA: Diagnosis not present

## 2020-08-03 DIAGNOSIS — M9903 Segmental and somatic dysfunction of lumbar region: Secondary | ICD-10-CM | POA: Diagnosis not present

## 2020-08-05 DIAGNOSIS — R519 Headache, unspecified: Secondary | ICD-10-CM | POA: Diagnosis not present

## 2020-08-05 DIAGNOSIS — M9903 Segmental and somatic dysfunction of lumbar region: Secondary | ICD-10-CM | POA: Diagnosis not present

## 2020-08-05 DIAGNOSIS — M9901 Segmental and somatic dysfunction of cervical region: Secondary | ICD-10-CM | POA: Diagnosis not present

## 2020-08-05 DIAGNOSIS — M5416 Radiculopathy, lumbar region: Secondary | ICD-10-CM | POA: Diagnosis not present

## 2020-08-08 DIAGNOSIS — M9903 Segmental and somatic dysfunction of lumbar region: Secondary | ICD-10-CM | POA: Diagnosis not present

## 2020-08-08 DIAGNOSIS — R519 Headache, unspecified: Secondary | ICD-10-CM | POA: Diagnosis not present

## 2020-08-08 DIAGNOSIS — M5416 Radiculopathy, lumbar region: Secondary | ICD-10-CM | POA: Diagnosis not present

## 2020-08-08 DIAGNOSIS — M9901 Segmental and somatic dysfunction of cervical region: Secondary | ICD-10-CM | POA: Diagnosis not present

## 2020-08-10 DIAGNOSIS — E119 Type 2 diabetes mellitus without complications: Secondary | ICD-10-CM | POA: Diagnosis not present

## 2020-08-10 DIAGNOSIS — R519 Headache, unspecified: Secondary | ICD-10-CM | POA: Diagnosis not present

## 2020-08-10 DIAGNOSIS — M5416 Radiculopathy, lumbar region: Secondary | ICD-10-CM | POA: Diagnosis not present

## 2020-08-10 DIAGNOSIS — Z0100 Encounter for examination of eyes and vision without abnormal findings: Secondary | ICD-10-CM | POA: Diagnosis not present

## 2020-08-10 DIAGNOSIS — M9903 Segmental and somatic dysfunction of lumbar region: Secondary | ICD-10-CM | POA: Diagnosis not present

## 2020-08-10 DIAGNOSIS — E1169 Type 2 diabetes mellitus with other specified complication: Secondary | ICD-10-CM | POA: Diagnosis not present

## 2020-08-10 DIAGNOSIS — E1165 Type 2 diabetes mellitus with hyperglycemia: Secondary | ICD-10-CM | POA: Diagnosis not present

## 2020-08-10 DIAGNOSIS — Z794 Long term (current) use of insulin: Secondary | ICD-10-CM | POA: Diagnosis not present

## 2020-08-10 DIAGNOSIS — I152 Hypertension secondary to endocrine disorders: Secondary | ICD-10-CM | POA: Diagnosis not present

## 2020-08-10 DIAGNOSIS — E1159 Type 2 diabetes mellitus with other circulatory complications: Secondary | ICD-10-CM | POA: Diagnosis not present

## 2020-08-10 DIAGNOSIS — E785 Hyperlipidemia, unspecified: Secondary | ICD-10-CM | POA: Diagnosis not present

## 2020-08-10 DIAGNOSIS — E1142 Type 2 diabetes mellitus with diabetic polyneuropathy: Secondary | ICD-10-CM | POA: Diagnosis not present

## 2020-08-10 DIAGNOSIS — M9901 Segmental and somatic dysfunction of cervical region: Secondary | ICD-10-CM | POA: Diagnosis not present

## 2020-08-15 DIAGNOSIS — R519 Headache, unspecified: Secondary | ICD-10-CM | POA: Diagnosis not present

## 2020-08-15 DIAGNOSIS — M9903 Segmental and somatic dysfunction of lumbar region: Secondary | ICD-10-CM | POA: Diagnosis not present

## 2020-08-15 DIAGNOSIS — M5416 Radiculopathy, lumbar region: Secondary | ICD-10-CM | POA: Diagnosis not present

## 2020-08-15 DIAGNOSIS — M9901 Segmental and somatic dysfunction of cervical region: Secondary | ICD-10-CM | POA: Diagnosis not present

## 2020-08-24 DIAGNOSIS — M5416 Radiculopathy, lumbar region: Secondary | ICD-10-CM | POA: Diagnosis not present

## 2020-08-24 DIAGNOSIS — M9903 Segmental and somatic dysfunction of lumbar region: Secondary | ICD-10-CM | POA: Diagnosis not present

## 2020-08-24 DIAGNOSIS — R519 Headache, unspecified: Secondary | ICD-10-CM | POA: Diagnosis not present

## 2020-08-24 DIAGNOSIS — M9901 Segmental and somatic dysfunction of cervical region: Secondary | ICD-10-CM | POA: Diagnosis not present

## 2020-08-26 DIAGNOSIS — M9903 Segmental and somatic dysfunction of lumbar region: Secondary | ICD-10-CM | POA: Diagnosis not present

## 2020-08-26 DIAGNOSIS — R519 Headache, unspecified: Secondary | ICD-10-CM | POA: Diagnosis not present

## 2020-08-26 DIAGNOSIS — M9901 Segmental and somatic dysfunction of cervical region: Secondary | ICD-10-CM | POA: Diagnosis not present

## 2020-08-26 DIAGNOSIS — M5416 Radiculopathy, lumbar region: Secondary | ICD-10-CM | POA: Diagnosis not present

## 2020-08-30 DIAGNOSIS — M5416 Radiculopathy, lumbar region: Secondary | ICD-10-CM | POA: Diagnosis not present

## 2020-08-30 DIAGNOSIS — R519 Headache, unspecified: Secondary | ICD-10-CM | POA: Diagnosis not present

## 2020-08-30 DIAGNOSIS — M9903 Segmental and somatic dysfunction of lumbar region: Secondary | ICD-10-CM | POA: Diagnosis not present

## 2020-08-30 DIAGNOSIS — M9901 Segmental and somatic dysfunction of cervical region: Secondary | ICD-10-CM | POA: Diagnosis not present

## 2020-09-02 DIAGNOSIS — M9903 Segmental and somatic dysfunction of lumbar region: Secondary | ICD-10-CM | POA: Diagnosis not present

## 2020-09-02 DIAGNOSIS — R519 Headache, unspecified: Secondary | ICD-10-CM | POA: Diagnosis not present

## 2020-09-02 DIAGNOSIS — M9901 Segmental and somatic dysfunction of cervical region: Secondary | ICD-10-CM | POA: Diagnosis not present

## 2020-09-02 DIAGNOSIS — M5416 Radiculopathy, lumbar region: Secondary | ICD-10-CM | POA: Diagnosis not present

## 2020-09-06 DIAGNOSIS — R519 Headache, unspecified: Secondary | ICD-10-CM | POA: Diagnosis not present

## 2020-09-06 DIAGNOSIS — M5416 Radiculopathy, lumbar region: Secondary | ICD-10-CM | POA: Diagnosis not present

## 2020-09-06 DIAGNOSIS — M9901 Segmental and somatic dysfunction of cervical region: Secondary | ICD-10-CM | POA: Diagnosis not present

## 2020-09-06 DIAGNOSIS — M9903 Segmental and somatic dysfunction of lumbar region: Secondary | ICD-10-CM | POA: Diagnosis not present

## 2020-09-13 DIAGNOSIS — M9903 Segmental and somatic dysfunction of lumbar region: Secondary | ICD-10-CM | POA: Diagnosis not present

## 2020-09-13 DIAGNOSIS — M9901 Segmental and somatic dysfunction of cervical region: Secondary | ICD-10-CM | POA: Diagnosis not present

## 2020-09-13 DIAGNOSIS — R519 Headache, unspecified: Secondary | ICD-10-CM | POA: Diagnosis not present

## 2020-09-13 DIAGNOSIS — M5416 Radiculopathy, lumbar region: Secondary | ICD-10-CM | POA: Diagnosis not present

## 2020-09-30 ENCOUNTER — Other Ambulatory Visit: Payer: Self-pay

## 2020-09-30 DIAGNOSIS — I5020 Unspecified systolic (congestive) heart failure: Secondary | ICD-10-CM

## 2020-09-30 MED ORDER — ENTRESTO 24-26 MG PO TABS: 1.0000 | ORAL_TABLET | Freq: Two times a day (BID) | ORAL | 0 refills | Status: DC

## 2020-09-30 MED ORDER — POTASSIUM CHLORIDE CRYS ER 20 MEQ PO TBCR
20.0000 meq | EXTENDED_RELEASE_TABLET | Freq: Two times a day (BID) | ORAL | 0 refills | Status: DC
Start: 2020-09-30 — End: 2020-11-14

## 2020-10-10 ENCOUNTER — Ambulatory Visit (INDEPENDENT_AMBULATORY_CARE_PROVIDER_SITE_OTHER): Payer: Medicare Other | Admitting: Cardiology

## 2020-10-10 ENCOUNTER — Other Ambulatory Visit: Payer: Self-pay

## 2020-10-10 ENCOUNTER — Encounter: Payer: Self-pay | Admitting: Cardiology

## 2020-10-10 VITALS — BP 158/100 | HR 84 | Ht 73.0 in | Wt 239.2 lb

## 2020-10-10 DIAGNOSIS — I428 Other cardiomyopathies: Secondary | ICD-10-CM

## 2020-10-10 DIAGNOSIS — I10 Essential (primary) hypertension: Secondary | ICD-10-CM | POA: Diagnosis not present

## 2020-10-10 MED ORDER — ENTRESTO 49-51 MG PO TABS: 1.0000 | ORAL_TABLET | Freq: Two times a day (BID) | ORAL | 6 refills | Status: DC

## 2020-10-10 NOTE — Progress Notes (Signed)
Cardiology Office Note:    Date:  10/10/2020   ID:  [name], DOB February 10, 1976, MRN [id number]  PCP:  Emi Belfast, FNP  Cardiologist:  Debbe Odea, MD  Electrophysiologist:  None   Referring MD: Emi Belfast, FNP   Chief Complaint  Patient presents with  . Other    4 month f/u no complaints today. Meds reviewed verbally with pt.    History of Present Illness:    [name] is a 44 y.o. male with a hx of hypertension, diabetes, hyperlipidemia, NICM initial EF <20 improved to 45 to 50% who presents for follow-up.    Overall feels well, denies chest pain or shortness of breath.  Blood pressures at home in the 120s systolic.  Still with neuropathy in legs/toes.  Has no other concerns at this time.  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  His insulin regimen was recently titrated by primary care provider.  He otherwise feels fine from a cardiac perspective   Past Medical History:  Diagnosis Date  . CHF (congestive heart failure) (HCC)   . CKD (chronic kidney disease), stage III (HCC)   . Diabetes mellitus   . ED (erectile dysfunction)   . Hypertension     Past Surgical History:  Procedure Laterality Date  . ANKLE SURGERY     both   . APPENDECTOMY    . back fusion     l4,l5  . BACK SURGERY    . LAPAROSCOPIC APPENDECTOMY N/A 03/28/2019   Procedure: APPENDECTOMY LAPAROSCOPIC;  Surgeon: Sung Amabile, DO;  Location: [facility] ORS;  Service: General;  Laterality: N/A;  . NASAL SINUS SURGERY      Current Medications: Current Meds  Medication Sig  . AMBULATORY NON FORMULARY MEDICATION Trimix (30/1/10)-(Pap/Phent/PGE)  Test Dose  39ml vial   Qty #3 Refills 0  [facility] [phone] Fax [phone]  . atorvastatin (LIPITOR) 40 MG tablet Take 1 tablet (40 mg total) by mouth daily at 6 PM.  . Blood Pressure  Monitoring (BLOOD PRESSURE MONITOR/L CUFF) MISC 1 Units by Does not apply route as needed.  . carvedilol (COREG) 6.25 MG tablet Take 1 tablet (6.25 mg total) by mouth 2 (two) times daily.  . DULoxetine (CYMBALTA) 20 MG capsule Take 2 capsules by mouth daily.  Marland Kitchen NOVOLIN 70/30 FLEXPEN (70-30) 100 UNIT/ML PEN Inject into the skin 2 (two) times daily. Sliding scale  . pantoprazole (PROTONIX) 40 MG tablet Take 1 tablet (40 mg total) by mouth daily.  . potassium chloride SA (KLOR-CON) 20 MEQ tablet Take 1 tablet (20 mEq total) by mouth 2 (two) times daily.  Marland Kitchen rOPINIRole (REQUIP) 0.25 MG tablet Take 1 tablet by mouth 3 (three) times daily.  . sildenafil (VIAGRA) 100 MG tablet Take 1 tablet (100 mg total) by mouth daily as needed for erectile dysfunction. Take two hours prior to intercourse on an empty stomach  . torsemide (DEMADEX) 20 MG tablet Take 1 tablet (20 mg total) by mouth every other day.  . [DISCONTINUED] sacubitril-valsartan (ENTRESTO) 24-26 MG Take 1 tablet by mouth 2 (two) times daily.     Allergies:   Food   Social History   Socioeconomic History  . Marital status: Married    Spouse name: Not on file  . Number of children: Not on file  . Years of education: Not on file  .  Highest education level: Not on file  Occupational History  . Occupation: unemployed  Tobacco Use  . Smoking status: Never Smoker  . Smokeless tobacco: Never Used  Vaping Use  . Vaping Use: Never used  Substance and Sexual Activity  . Alcohol use: No  . Drug use: No  . Sexual activity: Yes    Birth control/protection: None  Other Topics Concern  . Not on file  Social History Narrative  . Not on file   Social Determinants of Health   Financial Resource Strain: Low Risk   . Difficulty of Paying Living Expenses: Not hard at all  Food Insecurity: No Food Insecurity  . Worried About Programme researcher, broadcasting/film/video in the Last Year: Never true  . Ran Out of Food in the Last Year: Never true  Transportation Needs:  No Transportation Needs  . Lack of Transportation (Medical): No  . Lack of Transportation (Non-Medical): No  Physical Activity: Sufficiently Active  . Days of Exercise per Week: 3 days  . Minutes of Exercise per Session: 50 min  Stress: No Stress Concern Present  . Feeling of Stress : Not at all  Social Connections:   . Frequency of Communication with Friends and Family: Not on file  . Frequency of Social Gatherings with Friends and Family: Not on file  . Attends Religious Services: Not on file  . Active Member of Clubs or Organizations: Not on file  . Attends Banker Meetings: Not on file  . Marital Status: Not on file     Family History: The patient's family history includes Diabetes in his father; Hypertension in his mother.  ROS:   Please see the history of present illness.     All other systems reviewed and are negative.  EKGs/Labs/Other Studies Reviewed:    The following studies were reviewed today:    EKG:  EKG is  ordered today.  The ekg ordered today demonstrates normal sinus rhythm, normal ECG  Recent Labs: 11/10/2019: BUN 23; Creatinine, Ser 1.36; Magnesium 1.8; Potassium 3.2; Sodium 138  Recent Lipid Panel    Component Value Date/Time   CHOL 118 09/15/2019 0533   TRIG 49 09/15/2019 0533   HDL 48 09/15/2019 0533   CHOLHDL 2.5 09/15/2019 0533   VLDL 10 09/15/2019 0533   LDLCALC 60 09/15/2019 0533    Physical Exam:    VS:  BP (!) 158/100 (BP Location: Left Arm, Patient Position: Sitting, Cuff Size: Large)   Pulse 84   Ht 6\' 1"  (1.854 m)   Wt 239 lb 4 oz (108.5 kg)   SpO2 97%   BMI 31.57 kg/m     Wt Readings from Last 3 Encounters:  10/10/20 239 lb 4 oz (108.5 kg)  07/06/20 227 lb (103 kg)  06/15/20 (!) 233 lb 12 oz (106 kg)     GEN:  Well nourished, well developed in no acute distress HEENT: Normal NECK: No JVD; No carotid bruits LYMPHATICS: No lymphadenopathy CARDIAC: RRR, no murmurs, rubs, gallops RESPIRATORY:  Clear to  auscultation without rales, wheezing or rhonchi  ABDOMEN: Soft, non-tender, non-distended MUSCULOSKELETAL:  No edema; No deformity  SKIN: Warm and dry NEUROLOGIC:  Alert and oriented x 3 PSYCHIATRIC:  Normal affect   ASSESSMENT:   . 1. NICM (nonischemic cardiomyopathy) (HCC)   2. Primary hypertension    PLAN:     1. 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  Patient describes NYHA class I-II symptoms.  His original EF was less  than 20%.  He is euvolemic.  Repeat echocardiogram 7/2021showed EF 40 to 45%.  Increase Entresto to 49/51 mg twice daily.  Continue Coreg to 6.25 twice daily.   2.  History of hypertension.  Blood pressure elevated today, increase Entresto to 49/51 mg twice daily.  Continue Coreg 6.25 mg twice daily.  Follow-up in 3 months  Total encounter time more than 40 minutes  Greater than 50% was spent in counseling and coordination of care with the patient  This note was generated in part or whole with voice recognition software. Voice recognition is usually quite accurate but there are transcription errors that can and very often do occur. I apologize for any typographical errors that were not detected and corrected.  Medication Adjustments/Labs and Tests Ordered: Current medicines are reviewed at length with the patient today.  Concerns regarding medicines are outlined above.  No orders of the defined types were placed in this encounter.  Meds ordered this encounter  Medications  . sacubitril-valsartan (ENTRESTO) 49-51 MG    Sig: Take 1 tablet by mouth 2 (two) times daily.    Dispense:  60 tablet    Refill:  6    Dose Increase    Patient Instructions  Medication Instructions:  - Your physician has recommended you make the following change in your medication:   1) INCREASE entresto to 49/51 mg- take 1 tablet by mouth TWICE daily   *If you need a refill on your cardiac medications before your next appointment,  please call your pharmacy*   Lab Work: None ordered If you have labs (blood work) drawn today and your tests are completely normal, you will receive your results only by: Marland Kitchen MyChart Message (if you have MyChart) OR . A paper copy in the mail If you have any lab test that is abnormal or we need to change your treatment, we will call you to review the results.   Testing/Procedures: None ordered   Follow-Up: At [facility], you and your health needs are our priority.  As part of our continuing mission to provide you with exceptional heart care, we have created designated Provider Care Teams.  These Care Teams include your primary Cardiologist (physician) and Advanced Practice Providers (APPs -  Physician Assistants and Nurse Practitioners) who all work together to provide you with the care you need, when you need it.  We recommend signing up for the patient portal called "MyChart".  Sign up information is provided on this After Visit Summary.  MyChart is used to connect with patients for Virtual Visits (Telemedicine).  Patients are able to view lab/test results, encounter notes, upcoming appointments, etc.  Non-urgent messages can be sent to your provider as well.   To learn more about what you can do with MyChart, go to [email or web address].    Your next appointment:   3 month(s)  The format for your next appointment:   In Person  Provider:   You may see Debbe Odea, MD or one of the following Advanced Practice Providers on your designated Care Team:    Nicolasa Ducking, NP  Eula Listen, PA-C  Marisue Ivan, PA-C  Cadence [place], [place]  Gillian Shields, NP    Other Instructions      Signed, Debbe Odea, MD  10/10/2020 12:53 PM    [facility]

## 2020-10-10 NOTE — Patient Instructions (Signed)
Medication Instructions:  - Your physician has recommended you make the following change in your medication:   1) INCREASE entresto to 49/51 mg- take 1 tablet by mouth TWICE daily   *If you need a refill on your cardiac medications before your next appointment, please call your pharmacy*   Lab Work: None ordered If you have labs (blood work) drawn today and your tests are completely normal, you will receive your results only by: Marland Kitchen MyChart Message (if you have MyChart) OR . A paper copy in the mail If you have any lab test that is abnormal or we need to change your treatment, we will call you to review the results.   Testing/Procedures: None ordered   Follow-Up: At [facility], you and your health needs are our priority.  As part of our continuing mission to provide you with exceptional heart care, we have created designated Provider Care Teams.  These Care Teams include your primary Cardiologist (physician) and Advanced Practice Providers (APPs -  Physician Assistants and Nurse Practitioners) who all work together to provide you with the care you need, when you need it.  We recommend signing up for the patient portal called "MyChart".  Sign up information is provided on this After Visit Summary.  MyChart is used to connect with patients for Virtual Visits (Telemedicine).  Patients are able to view lab/test results, encounter notes, upcoming appointments, etc.  Non-urgent messages can be sent to your provider as well.   To learn more about what you can do with MyChart, go to [email or web address].    Your next appointment:   3 month(s)  The format for your next appointment:   In Person  Provider:   You may see Debbe Odea, MD or one of the following Advanced Practice Providers on your designated Care Team:    Nicolasa Ducking, NP  Eula Listen, PA-C  Marisue Ivan, PA-C  Cadence [place], [place]  Gillian Shields, NP    Other Instructions

## 2020-10-25 DIAGNOSIS — R519 Headache, unspecified: Secondary | ICD-10-CM | POA: Diagnosis not present

## 2020-10-25 DIAGNOSIS — M5416 Radiculopathy, lumbar region: Secondary | ICD-10-CM | POA: Diagnosis not present

## 2020-10-25 DIAGNOSIS — M9901 Segmental and somatic dysfunction of cervical region: Secondary | ICD-10-CM | POA: Diagnosis not present

## 2020-10-25 DIAGNOSIS — M9903 Segmental and somatic dysfunction of lumbar region: Secondary | ICD-10-CM | POA: Diagnosis not present

## 2020-10-26 NOTE — Addendum Note (Signed)
Addended by: Festus Aloe on: 10/26/2020 02:12 PM   Modules accepted: Orders

## 2020-11-14 ENCOUNTER — Telehealth: Payer: Self-pay | Admitting: Cardiology

## 2020-11-14 DIAGNOSIS — U071 COVID-19: Secondary | ICD-10-CM | POA: Diagnosis not present

## 2020-11-14 MED ORDER — POTASSIUM CHLORIDE CRYS ER 20 MEQ PO TBCR
20.0000 meq | EXTENDED_RELEASE_TABLET | Freq: Two times a day (BID) | ORAL | 3 refills | Status: DC
Start: 2020-11-14 — End: 2021-05-02

## 2020-11-14 NOTE — Telephone Encounter (Signed)
Received fax from [facility] on [address] requesting refill for Potassium 20 mEq BID. Rx request sent to pharmacy.

## 2020-11-21 ENCOUNTER — Other Ambulatory Visit: Payer: Self-pay | Admitting: *Deleted

## 2020-11-21 MED ORDER — ATORVASTATIN CALCIUM 40 MG PO TABS
40.0000 mg | ORAL_TABLET | Freq: Every day | ORAL | 0 refills | Status: DC
Start: 2020-11-21 — End: 2020-12-23

## 2020-12-23 ENCOUNTER — Other Ambulatory Visit: Payer: Self-pay

## 2020-12-23 MED ORDER — ATORVASTATIN CALCIUM 40 MG PO TABS
40.0000 mg | ORAL_TABLET | Freq: Every day | ORAL | 0 refills | Status: DC
Start: 2020-12-23 — End: 2021-07-06

## 2021-01-09 ENCOUNTER — Other Ambulatory Visit: Payer: Self-pay

## 2021-01-09 ENCOUNTER — Encounter: Payer: Self-pay | Admitting: Cardiology

## 2021-01-09 ENCOUNTER — Ambulatory Visit (INDEPENDENT_AMBULATORY_CARE_PROVIDER_SITE_OTHER): Payer: Medicare Other | Admitting: Cardiology

## 2021-01-09 VITALS — BP 140/106 | HR 84 | Ht 73.0 in | Wt 236.0 lb

## 2021-01-09 DIAGNOSIS — I428 Other cardiomyopathies: Secondary | ICD-10-CM | POA: Diagnosis not present

## 2021-01-09 DIAGNOSIS — I10 Essential (primary) hypertension: Secondary | ICD-10-CM | POA: Diagnosis not present

## 2021-01-09 MED ORDER — SPIRONOLACTONE 25 MG PO TABS: 25.0000 mg | ORAL_TABLET | Freq: Every day | ORAL | 2 refills | Status: DC

## 2021-01-09 NOTE — Progress Notes (Signed)
Cardiology Office Note:    Date:  01/09/2021   ID:  [name], DOB 27-Sep-1976, MRN [id number]  PCP:  Emi Belfast, FNP (Inactive)  Cardiologist:  Debbe Odea, MD  Electrophysiologist:  None   Referring MD: Emi Belfast, FNP   Chief Complaint  Patient presents with  . Follow-up    3 Months follow up. Medications verbally reviewed with patient.     History of Present Illness:    [name] is a 45 y.o. male with a hx of hypertension, diabetes, hyperlipidemia, NICM initial EF <20 improved to 40-45% who presents for follow-up.    [name] is being titrated after last visit.  Tolerating Entresto 49/51 mg twice daily.  States his blood pressures have been in the 120s to 130s systolic.  Currently on Coreg 6.25 mg twice daily.  Denies edema.  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  His insulin regimen was recently titrated by primary care provider.  He otherwise feels fine from a cardiac perspective   Past Medical History:  Diagnosis Date  . CHF (congestive heart failure) (HCC)   . CKD (chronic kidney disease), stage III (HCC)   . Diabetes mellitus   . ED (erectile dysfunction)   . Hypertension     Past Surgical History:  Procedure Laterality Date  . ANKLE SURGERY     both   . APPENDECTOMY    . back fusion     l4,l5  . BACK SURGERY    . LAPAROSCOPIC APPENDECTOMY N/A 03/28/2019   Procedure: APPENDECTOMY LAPAROSCOPIC;  Surgeon: Sung Amabile, DO;  Location: [facility] ORS;  Service: General;  Laterality: N/A;  . NASAL SINUS SURGERY      Current Medications: Current Meds  Medication Sig  . AMBULATORY NON FORMULARY MEDICATION Trimix (30/1/10)-(Pap/Phent/PGE)  Test Dose  70ml vial   Qty #3 Refills 0  [facility] [phone] Fax [phone]  . spironolactone (ALDACTONE) 25 MG tablet Take 1 tablet (25 mg total) by  mouth daily.     Allergies:   Food   Social History   Socioeconomic History  . Marital status: Married    Spouse name: Not on file  . Number of children: Not on file  . Years of education: Not on file  . Highest education level: Not on file  Occupational History  . Occupation: unemployed  Tobacco Use  . Smoking status: Never Smoker  . Smokeless tobacco: Never Used  Vaping Use  . Vaping Use: Never used  Substance and Sexual Activity  . Alcohol use: No  . Drug use: No  . Sexual activity: Yes    Birth control/protection: None  Other Topics Concern  . Not on file  Social History Narrative  . Not on file   Social Determinants of Health   Financial Resource Strain: Low Risk   . Difficulty of Paying Living Expenses: Not hard at all  Food Insecurity: No Food Insecurity  . Worried About Programme researcher, broadcasting/film/video in the Last Year: Never true  . Ran Out of Food in the Last Year: Never true  Transportation Needs: No Transportation Needs  . Lack of Transportation (Medical): No  . Lack of Transportation (Non-Medical): No  Physical Activity: Sufficiently Active  . Days of Exercise per Week: 3 days  . Minutes of Exercise per Session: 50 min  Stress: No Stress Concern Present  .  Feeling of Stress : Not at all  Social Connections: Not on file     Family History: The patient's family history includes Diabetes in his father; Hypertension in his mother.  ROS:   Please see the history of present illness.     All other systems reviewed and are negative.  EKGs/Labs/Other Studies Reviewed:    The following studies were reviewed today:    EKG:  EKG not  ordered today.  Recent Labs: No results found for requested labs within last 8760 hours.  Recent Lipid Panel    Component Value Date/Time   CHOL 118 09/15/2019 0533   TRIG 49 09/15/2019 0533   HDL 48 09/15/2019 0533   CHOLHDL 2.5 09/15/2019 0533   VLDL 10 09/15/2019 0533   LDLCALC 60 09/15/2019 0533    Physical Exam:     VS:  BP (!) 140/106 (BP Location: Left Arm, Patient Position: Sitting, Cuff Size: Normal)   Pulse 84   Ht 6\' 1"  (1.854 m)   Wt 236 lb (107 kg)   SpO2 97%   BMI 31.14 kg/m     Wt Readings from Last 3 Encounters:  01/09/21 236 lb (107 kg)  10/10/20 239 lb 4 oz (108.5 kg)  07/06/20 227 lb (103 kg)     GEN:  Well nourished, well developed in no acute distress HEENT: Normal NECK: No JVD; No carotid bruits LYMPHATICS: No lymphadenopathy CARDIAC: RRR, no murmurs, rubs, gallops RESPIRATORY:  Clear to auscultation without rales, wheezing or rhonchi  ABDOMEN: Soft, non-tender, non-distended MUSCULOSKELETAL:  No edema; No deformity  SKIN: Warm and dry NEUROLOGIC:  Alert and oriented x 3 PSYCHIATRIC:  Normal affect   ASSESSMENT:   . 1. NICM (nonischemic cardiomyopathy) (HCC)   2. Primary hypertension    PLAN:     1. 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  Patient describes NYHA class I-II symptoms.  His original EF was less than 20%.  Last EF 40 to 45%.  He is euvolemic.  Start Aldactone 25 mg daily, continue Coreg 6.25 mg twice daily, continue Entresto 49/51 mg twice daily.   2.  History of hypertension.  Blood pressure elevated today, start Aldactone 25 mg daily, continue Entresto, Coreg as above.  Follow-up in 6 weeks for Entresto titration if BP permits  Total encounter time more than 40 minutes  Greater than 50% was spent in counseling and coordination of care with the patient   Medication Adjustments/Labs and Tests Ordered: Current medicines are reviewed at length with the patient today.  Concerns regarding medicines are outlined above.  No orders of the defined types were placed in this encounter.  Meds ordered this encounter  Medications  . spironolactone (ALDACTONE) 25 MG tablet    Sig: Take 1 tablet (25 mg total) by mouth daily.    Dispense:  30 tablet    Refill:  2    Patient Instructions  Medication  Instructions:  Your physician has recommended you make the following change in your medication:  1- START Spironolactone 25 mg by mouth once a day.  *If you need a refill on your cardiac medications before your next appointment, please call your pharmacy*  Follow-Up: At [facility], you and your health needs are our priority.  As part of our continuing mission to provide you with exceptional heart care, we have created designated Provider Care Teams.  These Care Teams include your primary Cardiologist (physician) and Advanced Practice Providers (APPs -  Physician Assistants and Nurse  Practitioners) who all work together to provide you with the care you need, when you need it.  We recommend signing up for the patient portal called "MyChart".  Sign up information is provided on this After Visit Summary.  MyChart is used to connect with patients for Virtual Visits (Telemedicine).  Patients are able to view lab/test results, encounter notes, upcoming appointments, etc.  Non-urgent messages can be sent to your provider as well.   To learn more about what you can do with MyChart, go to [email or web address].    Your next appointment:   6 week(s)  The format for your next appointment:   In Person  Provider:   You may see Debbe Odea, MD or one of the following Advanced Practice Providers on your designated Care Team:    Nicolasa Ducking, NP  Eula Listen, PA-C  Marisue Ivan, PA-C  Cadence [place], [place]  Gillian Shields, NP     Signed, Debbe Odea, MD  01/09/2021 12:15 PM    [facility]

## 2021-01-09 NOTE — Patient Instructions (Signed)
Medication Instructions:  Your physician has recommended you make the following change in your medication:  1- START Spironolactone 25 mg by mouth once a day.  *If you need a refill on your cardiac medications before your next appointment, please call your pharmacy*  Follow-Up: At [facility], you and your health needs are our priority.  As part of our continuing mission to provide you with exceptional heart care, we have created designated Provider Care Teams.  These Care Teams include your primary Cardiologist (physician) and Advanced Practice Providers (APPs -  Physician Assistants and Nurse Practitioners) who all work together to provide you with the care you need, when you need it.  We recommend signing up for the patient portal called "MyChart".  Sign up information is provided on this After Visit Summary.  MyChart is used to connect with patients for Virtual Visits (Telemedicine).  Patients are able to view lab/test results, encounter notes, upcoming appointments, etc.  Non-urgent messages can be sent to your provider as well.   To learn more about what you can do with MyChart, go to [email or web address].    Your next appointment:   6 week(s)  The format for your next appointment:   In Person  Provider:   You may see Debbe Odea, MD or one of the following Advanced Practice Providers on your designated Care Team:    Nicolasa Ducking, NP  Eula Listen, PA-C  Marisue Ivan, PA-C  Cadence [place], [place]  Gillian Shields, NP

## 2021-02-07 ENCOUNTER — Other Ambulatory Visit: Payer: Self-pay

## 2021-02-07 MED ORDER — CARVEDILOL 6.25 MG PO TABS
6.2500 mg | ORAL_TABLET | Freq: Two times a day (BID) | ORAL | 3 refills | Status: DC
Start: 2021-02-07 — End: 2021-02-13

## 2021-02-13 ENCOUNTER — Telehealth: Payer: Self-pay | Admitting: Cardiology

## 2021-02-13 MED ORDER — CARVEDILOL 6.25 MG PO TABS
6.2500 mg | ORAL_TABLET | Freq: Two times a day (BID) | ORAL | 3 refills | Status: DC
Start: 2021-02-13 — End: 2022-04-10

## 2021-02-13 NOTE — Telephone Encounter (Signed)
Received fax from [facility] on [address] In [place] requesting refills for Carvedilol 6.25 mg. Rx request sent to pharmacy.

## 2021-02-20 ENCOUNTER — Ambulatory Visit (INDEPENDENT_AMBULATORY_CARE_PROVIDER_SITE_OTHER): Payer: Medicare Other | Admitting: Cardiology

## 2021-02-20 ENCOUNTER — Encounter: Payer: Self-pay | Admitting: Cardiology

## 2021-02-20 ENCOUNTER — Other Ambulatory Visit: Payer: Self-pay

## 2021-02-20 ENCOUNTER — Telehealth: Payer: Self-pay | Admitting: Cardiology

## 2021-02-20 VITALS — BP 150/100 | HR 80 | Ht 73.0 in | Wt 236.0 lb

## 2021-02-20 DIAGNOSIS — I10 Essential (primary) hypertension: Secondary | ICD-10-CM

## 2021-02-20 DIAGNOSIS — I428 Other cardiomyopathies: Secondary | ICD-10-CM

## 2021-02-20 MED ORDER — SPIRONOLACTONE 25 MG PO TABS: 25.0000 mg | ORAL_TABLET | Freq: Every day | ORAL | 3 refills | Status: DC

## 2021-02-20 NOTE — Telephone Encounter (Signed)
  Patient Consent for Virtual Visit         [name] has provided verbal consent on 02/20/2021 for a virtual visit (video or telephone).   CONSENT FOR VIRTUAL VISIT FOR:  [name]  By participating in this virtual visit I agree to the following:  I hereby voluntarily request, consent and authorize [facility] and its employed or contracted physicians, physician assistants, nurse practitioners or other licensed health care professionals (the Practitioner), to provide me with telemedicine health care services (the "Services") as deemed necessary by the treating Practitioner. I acknowledge and consent to receive the Services by the Practitioner via telemedicine. I understand that the telemedicine visit will involve communicating with the Practitioner through live audiovisual communication technology and the disclosure of certain medical information by electronic transmission. I acknowledge that I have been given the opportunity to request an in-person assessment or other available alternative prior to the telemedicine visit and am voluntarily participating in the telemedicine visit.  I understand that I have the right to withhold or withdraw my consent to the use of telemedicine in the course of my care at any time, without affecting my right to future care or treatment, and that the Practitioner or I may terminate the telemedicine visit at any time. I understand that I have the right to inspect all information obtained and/or recorded in the course of the telemedicine visit and may receive copies of available information for a reasonable fee.  I understand that some of the potential risks of receiving the Services via telemedicine include:  Marland Kitchen Delay or interruption in medical evaluation due to technological equipment failure or disruption; . Information transmitted may not be sufficient (e.g. poor resolution of images) to allow for appropriate medical decision making by the  Practitioner; and/or  . In rare instances, security protocols could fail, causing a breach of personal health information.  Furthermore, I acknowledge that it is my responsibility to provide information about my medical history, conditions and care that is complete and accurate to the best of my ability. I acknowledge that Practitioner's advice, recommendations, and/or decision may be based on factors not within their control, such as incomplete or inaccurate data provided by me or distortions of diagnostic images or specimens that may result from electronic transmissions. I understand that the practice of medicine is not an exact science and that Practitioner makes no warranties or guarantees regarding treatment outcomes. I acknowledge that a copy of this consent can be made available to me via my patient portal [facility] MyChart), or I can request a printed copy by calling the office of [facility].    I understand that my insurance will be billed for this visit.   I have read or had this consent read to me. . I understand the contents of this consent, which adequately explains the benefits and risks of the Services being provided via telemedicine.  . I have been provided ample opportunity to ask questions regarding this consent and the Services and have had my questions answered to my satisfaction. . I give my informed consent for the services to be provided through the use of telemedicine in my medical care

## 2021-02-20 NOTE — Patient Instructions (Signed)
Medication Instructions:   Pick up your Aldactone (Spironolactone) from your pharmacy and start taking:  Spironolactone 25 mg by mouth once a day.  *If you need a refill on your cardiac medications before your next appointment, please call your pharmacy*   Lab Work: None ordered If you have labs (blood work) drawn today and your tests are completely normal, you will receive your results only by: Marland Kitchen MyChart Message (if you have MyChart) OR . A paper copy in the mail If you have any lab test that is abnormal or we need to change your treatment, we will call you to review the results.   Testing/Procedures: None ordered   Follow-Up: At [facility], you and your health needs are our priority.  As part of our continuing mission to provide you with exceptional heart care, we have created designated Provider Care Teams.  These Care Teams include your primary Cardiologist (physician) and Advanced Practice Providers (APPs -  Physician Assistants and Nurse Practitioners) who all work together to provide you with the care you need, when you need it.  We recommend signing up for the patient portal called "MyChart".  Sign up information is provided on this After Visit Summary.  MyChart is used to connect with patients for Virtual Visits (Telemedicine).  Patients are able to view lab/test results, encounter notes, upcoming appointments, etc.  Non-urgent messages can be sent to your provider as well.   To learn more about what you can do with MyChart, go to [email or web address].    Your next appointment:   1 month(s)  The format for your next appointment:   Video or In Person  Provider:   Debbe Odea, MD   Other Instructions

## 2021-02-20 NOTE — Progress Notes (Signed)
Cardiology Office Note:    Date:  02/20/2021   ID:  [name], DOB [birth year], MRN [id number]  PCP:  Emi Belfast, FNP (Inactive)  Cardiologist:  Debbe Odea, MD  Electrophysiologist:  None   Referring MD: No ref. provider found   Chief Complaint  Patient presents with  . Other    6 week follow up. Meds reviewed verbally with patient    History of Present Illness:    [name] is a 45 y.o. male with a hx of hypertension, diabetes, hyperlipidemia, NICM initial EF <20 improved to 40-45% who presents for follow-up.    CHF medications are being titrated.  Aldactone was started after last visit but patient did not pick up.  Tolerating Entresto 49-51 mg twice daily, Coreg 6.25 mg twice daily.  States having a history of esophageal constriction, underwent esophageal dilation in his 22s.  Also thinks he may have a peanut allergy.  Denies chest pain or shortness of breath, rarely uses torsemide.  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  His insulin regimen was recently titrated by primary care provider.  He otherwise feels fine from a cardiac perspective   Past Medical History:  Diagnosis Date  . CHF (congestive heart failure) (HCC)   . CKD (chronic kidney disease), stage III (HCC)   . Diabetes mellitus   . ED (erectile dysfunction)   . Hypertension     Past Surgical History:  Procedure Laterality Date  . ANKLE SURGERY     both   . APPENDECTOMY    . back fusion     l4,l5  . BACK SURGERY    . LAPAROSCOPIC APPENDECTOMY N/A 03/28/2019   Procedure: APPENDECTOMY LAPAROSCOPIC;  Surgeon: Sung Amabile, DO;  Location: [facility] ORS;  Service: General;  Laterality: N/A;  . NASAL SINUS SURGERY      Current Medications: Current Meds  Medication Sig  . AMBULATORY NON FORMULARY MEDICATION Trimix (30/1/10)-(Pap/Phent/PGE)  Test Dose  68ml vial    Qty #3 Refills 0  [facility] [phone] Fax [phone]  . atorvastatin (LIPITOR) 40 MG tablet Take 1 tablet (40 mg total) by mouth daily at 6 PM.  . Blood Pressure Monitoring (BLOOD PRESSURE MONITOR/L CUFF) MISC 1 Units by Does not apply route as needed.  . carvedilol (COREG) 6.25 MG tablet Take 1 tablet (6.25 mg total) by mouth 2 (two) times daily.  Marland Kitchen NOVOLIN 70/30 FLEXPEN (70-30) 100 UNIT/ML PEN Inject into the skin 2 (two) times daily. Sliding scale  . potassium chloride SA (KLOR-CON) 20 MEQ tablet Take 1 tablet (20 mEq total) by mouth 2 (two) times daily.  . pregabalin (LYRICA) 50 MG capsule Take 50 mg by mouth in the morning and at bedtime.  Marland Kitchen rOPINIRole (REQUIP) 0.25 MG tablet Take 1 tablet by mouth 3 (three) times daily.  . sacubitril-valsartan (ENTRESTO) 49-51 MG Take 1 tablet by mouth 2 (two) times daily.  Marland Kitchen spironolactone (ALDACTONE) 25 MG tablet Take 1 tablet (25 mg total) by mouth daily.  Marland Kitchen torsemide (DEMADEX) 20 MG tablet Take 1 tablet (20 mg total) by mouth every other day.     Allergies:   Food   Social History   Socioeconomic History  . Marital status: Married    Spouse name: Not on file  . Number of children: Not on file  . Years of education: Not on file  .  Highest education level: Not on file  Occupational History  . Occupation: unemployed  Tobacco Use  . Smoking status: Never Smoker  . Smokeless tobacco: Never Used  Vaping Use  . Vaping Use: Never used  Substance and Sexual Activity  . Alcohol use: No  . Drug use: No  . Sexual activity: Yes    Birth control/protection: None  Other Topics Concern  . Not on file  Social History Narrative  . Not on file   Social Determinants of Health   Financial Resource Strain: Low Risk   . Difficulty of Paying Living Expenses: Not hard at all  Food Insecurity: No Food Insecurity  . Worried About Programme researcher, broadcasting/film/video in the Last Year: Never true  . Ran Out of Food in the Last Year: Never true   Transportation Needs: No Transportation Needs  . Lack of Transportation (Medical): No  . Lack of Transportation (Non-Medical): No  Physical Activity: Sufficiently Active  . Days of Exercise per Week: 3 days  . Minutes of Exercise per Session: 50 min  Stress: No Stress Concern Present  . Feeling of Stress : Not at all  Social Connections: Not on file     Family History: The patient's family history includes Diabetes in his father; Hypertension in his mother.  ROS:   Please see the history of present illness.     All other systems reviewed and are negative.  EKGs/Labs/Other Studies Reviewed:    The following studies were reviewed today:    EKG:  EKG not  ordered today.  Recent Labs: No results found for requested labs within last 8760 hours.  Recent Lipid Panel    Component Value Date/Time   CHOL 118 09/15/2019 0533   TRIG 49 09/15/2019 0533   HDL 48 09/15/2019 0533   CHOLHDL 2.5 09/15/2019 0533   VLDL 10 09/15/2019 0533   LDLCALC 60 09/15/2019 0533    Physical Exam:    VS:  BP (!) 150/100 (BP Location: Left Arm, Patient Position: Sitting, Cuff Size: Normal)   Pulse 80   Ht 6\' 1"  (1.854 m)   Wt 236 lb (107 kg)   SpO2 98%   BMI 31.14 kg/m     Wt Readings from Last 3 Encounters:  02/20/21 236 lb (107 kg)  01/09/21 236 lb (107 kg)  10/10/20 239 lb 4 oz (108.5 kg)     GEN:  Well nourished, well developed in no acute distress HEENT: Normal NECK: No JVD; No carotid bruits LYMPHATICS: No lymphadenopathy CARDIAC: RRR, no murmurs, rubs, gallops RESPIRATORY:  Clear to auscultation without rales, wheezing or rhonchi  ABDOMEN: Soft, non-tender, non-distended MUSCULOSKELETAL:  No edema; No deformity  SKIN: Warm and dry NEUROLOGIC:  Alert and oriented x 3 PSYCHIATRIC:  Normal affect   ASSESSMENT:   . 1. NICM (nonischemic cardiomyopathy) (HCC)   2. Primary hypertension    PLAN:     1. Patient with nonischemic cardiomyopathy likely secondary to hypertensive  heart disease.  Patient describes NYHA class I-II symptoms.  His original EF was less than 20%.  Last EF 40 to 45%.  He is euvolemic.  Recommend he starts Aldactone 25 mg daily, continue Coreg 6.25 mg twice daily, continue Entresto 49/51 mg twice daily.  If blood pressure permits at follow-up visit, increase Entresto to 97/103 mg twice daily.  2.  History of hypertension.  Blood pressure elevated today, start Aldactone 25 mg daily, continue Entresto, Coreg as above.  Follow-up in 4 weeks for Entresto titration if  BP permits    Medication Adjustments/Labs and Tests Ordered: Current medicines are reviewed at length with the patient today.  Concerns regarding medicines are outlined above.  No orders of the defined types were placed in this encounter.  Meds ordered this encounter  Medications  . spironolactone (ALDACTONE) 25 MG tablet    Sig: Take 1 tablet (25 mg total) by mouth daily.    Dispense:  30 tablet    Refill:  3    Patient Instructions  Medication Instructions:   Pick up your Aldactone (Spironolactone) from your pharmacy and start taking:  Spironolactone 25 mg by mouth once a day.  *If you need a refill on your cardiac medications before your next appointment, please call your pharmacy*   Lab Work: None ordered If you have labs (blood work) drawn today and your tests are completely normal, you will receive your results only by: Marland Kitchen MyChart Message (if you have MyChart) OR . A paper copy in the mail If you have any lab test that is abnormal or we need to change your treatment, we will call you to review the results.   Testing/Procedures: None ordered   Follow-Up: At [facility], you and your health needs are our priority.  As part of our continuing mission to provide you with exceptional heart care, we have created designated Provider Care Teams.  These Care Teams include your primary Cardiologist (physician) and Advanced Practice Providers (APPs -  Physician  Assistants and Nurse Practitioners) who all work together to provide you with the care you need, when you need it.  We recommend signing up for the patient portal called "MyChart".  Sign up information is provided on this After Visit Summary.  MyChart is used to connect with patients for Virtual Visits (Telemedicine).  Patients are able to view lab/test results, encounter notes, upcoming appointments, etc.  Non-urgent messages can be sent to your provider as well.   To learn more about what you can do with MyChart, go to [email or web address].    Your next appointment:   1 month(s)  The format for your next appointment:   Video or In Person  Provider:   Debbe Odea, MD   Other Instructions      Signed, Debbe Odea, MD  02/20/2021 12:22 PM    [facility]

## 2021-03-23 ENCOUNTER — Other Ambulatory Visit: Payer: Self-pay

## 2021-03-23 ENCOUNTER — Telehealth: Payer: Self-pay | Admitting: Cardiology

## 2021-03-23 ENCOUNTER — Encounter: Payer: Medicare Other | Admitting: Cardiology

## 2021-03-23 NOTE — Telephone Encounter (Signed)
Called pt to get him ready for video visit which was scheduled for today at 11:40 AM. Pt stated he was sorry, but needed to cancel and reschedule appointment because he had something he needed to do. Explained to pt I would cancel appointment and send message to our schedulers to give him a call back to reschedule. Pt verbalized thanks.  Routing to [employer], Charity fundraiser also as [employer].

## 2021-03-23 NOTE — Telephone Encounter (Signed)
Noted  

## 2021-04-25 ENCOUNTER — Ambulatory Visit: Payer: Medicare Other | Admitting: Allergy and Immunology

## 2021-05-02 ENCOUNTER — Other Ambulatory Visit: Payer: Self-pay

## 2021-05-02 MED ORDER — POTASSIUM CHLORIDE CRYS ER 20 MEQ PO TBCR: 20.0000 meq | EXTENDED_RELEASE_TABLET | Freq: Two times a day (BID) | ORAL | 0 refills | Status: DC

## 2021-06-27 ENCOUNTER — Telehealth: Payer: Self-pay

## 2021-06-27 NOTE — Telephone Encounter (Signed)
Please contact patient regarding a refill for Potassium Chloride 20 meq one tablet twice daily. The patient was due to have a BMET & a follow up visit 1 month after his April 2022 appointment. The patient cancelled the last video visit and has not rescheduled. Please review for refill.

## 2021-06-30 NOTE — Telephone Encounter (Signed)
Called patient and discussed with him that he needs to be seen for an overdue follow up as he cancelled his in 03/2021. Dr. Nelma Rothman was wanting to go up on his Entresto if his BP tolerated the initial dose. He also needs some lab work as his last K+ was 3.2 on 11/10/19.  Patient was agreeable and I scheduled him to come in on 07/03/21.

## 2021-07-03 ENCOUNTER — Other Ambulatory Visit: Payer: Self-pay

## 2021-07-03 ENCOUNTER — Encounter: Payer: Self-pay | Admitting: Cardiology

## 2021-07-03 ENCOUNTER — Ambulatory Visit (INDEPENDENT_AMBULATORY_CARE_PROVIDER_SITE_OTHER): Payer: Medicare Other | Admitting: Cardiology

## 2021-07-03 VITALS — BP 130/96 | HR 67 | Ht 73.5 in | Wt 226.0 lb

## 2021-07-03 DIAGNOSIS — I428 Other cardiomyopathies: Secondary | ICD-10-CM

## 2021-07-03 DIAGNOSIS — I10 Essential (primary) hypertension: Secondary | ICD-10-CM

## 2021-07-03 MED ORDER — ENTRESTO 97-103 MG PO TABS: 1.0000 | ORAL_TABLET | Freq: Two times a day (BID) | ORAL | 5 refills | Status: DC

## 2021-07-03 NOTE — Progress Notes (Signed)
Cardiology Office Note:    Date:  07/03/2021   ID:  [name], DOB 24-Sep-1976, MRN [id number]  PCP:  Emi Belfast, FNP  Cardiologist:  Debbe Odea, MD  Electrophysiologist:  None   Referring MD: Emi Belfast, FNP   Chief Complaint  Patient presents with   Other    Past due follow up. Meds reviewed verbally with patient.      History of Present Illness:    [name] is a 45 y.o. male with a hx of hypertension, diabetes, hyperlipidemia, NICM initial EF <20 improved to 40-45% who presents for follow-up.    Patient is being seen for nonischemic cardiomyopathy and medication titration.  Aldactone 25 mg daily was started after last visit.  Missed last appointment.  States being compliant with all his medications, has not needed torsemide.  Denies chest pain or shortness of breath.   Denies chest pain or shortness of breath, rarely uses torsemide.  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  His insulin regimen was recently titrated by primary care provider.  He otherwise feels fine from a cardiac perspective   Past Medical History:  Diagnosis Date   CHF (congestive heart failure) (HCC)    CKD (chronic kidney disease), stage III (HCC)    Diabetes mellitus    ED (erectile dysfunction)    Hypertension     Past Surgical History:  Procedure Laterality Date   ANKLE SURGERY     both    APPENDECTOMY     back fusion     l4,l5   BACK SURGERY     LAPAROSCOPIC APPENDECTOMY N/A 03/28/2019   Procedure: APPENDECTOMY LAPAROSCOPIC;  Surgeon: Sung Amabile, DO;  Location: [facility] ORS;  Service: General;  Laterality: N/A;   NASAL SINUS SURGERY      Current Medications: Current Meds  Medication Sig   AMBULATORY NON FORMULARY MEDICATION Trimix (30/1/10)-(Pap/Phent/PGE)  Test Dose  38ml vial   Qty #3 Refills 0  [facility] [phone] Fax [phone]   atorvastatin (LIPITOR) 40 MG tablet Take 1 tablet (40 mg total) by mouth daily at 6 PM.   Blood Pressure Monitoring (BLOOD PRESSURE MONITOR/L CUFF) MISC 1 Units by Does not apply route as needed.   carvedilol (COREG) 6.25 MG tablet Take 1 tablet (6.25 mg total) by mouth 2 (two) times daily.   NOVOLIN 70/30 FLEXPEN (70-30) 100 UNIT/ML PEN Inject into the skin 2 (two) times daily. Sliding scale   pregabalin (LYRICA) 50 MG capsule Take 50 mg by mouth in the morning and at bedtime.   rOPINIRole (REQUIP) 0.25 MG tablet Take 1 tablet by mouth 3 (three) times daily.   sacubitril-valsartan (ENTRESTO) 97-103 MG Take 1 tablet by mouth 2 (two) times daily.   spironolactone (ALDACTONE) 25 MG tablet Take 1 tablet (25 mg total) by mouth daily.   [DISCONTINUED] potassium chloride SA (KLOR-CON) 20 MEQ tablet Take 1 tablet (20 mEq total) by mouth 2 (two) times daily. Please schedule office visit for further refills. Thank you!   [DISCONTINUED] sacubitril-valsartan (ENTRESTO) 49-51 MG Take 1 tablet by mouth 2 (two) times daily.     Allergies:   Food   Social History   Socioeconomic History   Marital status: Married    Spouse name: Not on file   Number of children: Not on file   Years of education: Not on file  Highest education level: Not on file  Occupational History   Occupation: unemployed  Tobacco Use   Smoking status: Never   Smokeless tobacco: Never  Vaping Use   Vaping Use: Never used  Substance and Sexual Activity   Alcohol use: No   Drug use: No   Sexual activity: Yes    Birth control/protection: None  Other Topics Concern   Not on file  Social History Narrative   Not on file   Social Determinants of Health   Financial Resource Strain: Not on file  Food Insecurity: Not on file  Transportation Needs: Not on file  Physical Activity: Not on file  Stress: Not on file  Social Connections: Not on file     Family History: The patient's  family history includes Diabetes in his father; Hypertension in his mother.  ROS:   Please see the history of present illness.     All other systems reviewed and are negative.  EKGs/Labs/Other Studies Reviewed:    The following studies were reviewed today:   EKG:  EKG is ordered today.  EKG shows normal sinus rhythm, normal ECG.  Recent Labs: No results found for requested labs within last 8760 hours.  Recent Lipid Panel    Component Value Date/Time   CHOL 118 09/15/2019 0533   TRIG 49 09/15/2019 0533   HDL 48 09/15/2019 0533   CHOLHDL 2.5 09/15/2019 0533   VLDL 10 09/15/2019 0533   LDLCALC 60 09/15/2019 0533    Physical Exam:    VS:  BP (!) 130/96 (BP Location: Left Arm, Patient Position: Sitting, Cuff Size: Normal)   Pulse 67   Ht 6' 1.5" (1.867 m)   Wt 226 lb (102.5 kg)   BMI 29.41 kg/m     Wt Readings from Last 3 Encounters:  07/03/21 226 lb (102.5 kg)  02/20/21 236 lb (107 kg)  01/09/21 236 lb (107 kg)     GEN:  Well nourished, well developed in no acute distress HEENT: Normal NECK: No JVD; No carotid bruits LYMPHATICS: No lymphadenopathy CARDIAC: RRR, no murmurs, rubs, gallops RESPIRATORY:  Clear to auscultation without rales, wheezing or rhonchi  ABDOMEN: Soft, non-tender, non-distended MUSCULOSKELETAL:  No edema; No deformity  SKIN: Warm and dry NEUROLOGIC:  Alert and oriented x 3 PSYCHIATRIC:  Normal affect   ASSESSMENT:   . 1. NICM (nonischemic cardiomyopathy) (HCC)   2. Primary hypertension     PLAN:     1. 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  Patient describes NYHA class I-II symptoms.  His original EF was less than 20%.  Last EF 40 to 45%.  He is euvolemic.  Increase Entresto to 97/103 mg twice daily.  Continue Coreg 6.25 mg twice daily, Aldactone 25 mg daily.  Check BMP to evaluate need for torsemide or KCl.  Repeat echocardiogram in 3 months.  Follow-up after.  2.  History of  hypertension.  BP elevated.  Increase Entresto as above.  Continue Coreg, Aldactone.  Follow-up in 3 months after repeat echocardiogram.    Medication Adjustments/Labs and Tests Ordered: Current medicines are reviewed at length with the patient today.  Concerns regarding medicines are outlined above.  Orders Placed This Encounter  Procedures   Basic metabolic panel   EKG 12-Lead   ECHOCARDIOGRAM COMPLETE    Meds ordered this encounter  Medications   sacubitril-valsartan (ENTRESTO) 97-103 MG    Sig: Take 1 tablet by mouth 2 (two) times daily.    Dispense:  60 tablet  Refill:  5     Patient Instructions  Medication Instructions:   Your physician has recommended you make the following change in your medication:    STOP taking Potassium.  2.    INCREASE your Entresto to 97-103 MG twice a day.  *If you need a refill on your cardiac medications before your next appointment, please call your pharmacy*   Lab Work:  BMP to be drawn in office today.  If you have labs (blood work) drawn today and your tests are completely normal, you will receive your results only by: MyChart Message (if you have MyChart) OR A paper copy in the mail If you have any lab test that is abnormal or we need to change your treatment, we will call you to review the results.   Testing/Procedures:  Your physician has requested that you have an echocardiogram in 3 months. Echocardiography is a painless test that uses sound waves to create images of your heart. It provides your doctor with information about the size and shape of your heart and how well your heart's chambers and valves are working. This procedure takes approximately one hour. There are no restrictions for this procedure.    Follow-Up: At [facility], you and your health needs are our priority.  As part of our continuing mission to provide you with exceptional heart care, we have created designated Provider Care Teams.  These Care  Teams include your primary Cardiologist (physician) and Advanced Practice Providers (APPs -  Physician Assistants and Nurse Practitioners) who all work together to provide you with the care you need, when you need it.  We recommend signing up for the patient portal called "MyChart".  Sign up information is provided on this After Visit Summary.  MyChart is used to connect with patients for Virtual Visits (Telemedicine).  Patients are able to view lab/test results, encounter notes, upcoming appointments, etc.  Non-urgent messages can be sent to your provider as well.   To learn more about what you can do with MyChart, go to [email or web address].    Your next appointment:   Follow up in 3 months after Echo   The format for your next appointment:   In Person  Provider:   Debbe Odea, MD   Other Instructions    Signed, Debbe Odea, MD  07/03/2021 4:45 PM    [facility]

## 2021-07-03 NOTE — Patient Instructions (Signed)
Medication Instructions:   Your physician has recommended you make the following change in your medication:    STOP taking Potassium.  2.    INCREASE your Entresto to 97-103 MG twice a day.  *If you need a refill on your cardiac medications before your next appointment, please call your pharmacy*   Lab Work:  BMP to be drawn in office today.  If you have labs (blood work) drawn today and your tests are completely normal, you will receive your results only by: MyChart Message (if you have MyChart) OR A paper copy in the mail If you have any lab test that is abnormal or we need to change your treatment, we will call you to review the results.   Testing/Procedures:  Your physician has requested that you have an echocardiogram in 3 months. Echocardiography is a painless test that uses sound waves to create images of your heart. It provides your doctor with information about the size and shape of your heart and how well your heart's chambers and valves are working. This procedure takes approximately one hour. There are no restrictions for this procedure.    Follow-Up: At [facility], you and your health needs are our priority.  As part of our continuing mission to provide you with exceptional heart care, we have created designated Provider Care Teams.  These Care Teams include your primary Cardiologist (physician) and Advanced Practice Providers (APPs -  Physician Assistants and Nurse Practitioners) who all work together to provide you with the care you need, when you need it.  We recommend signing up for the patient portal called "MyChart".  Sign up information is provided on this After Visit Summary.  MyChart is used to connect with patients for Virtual Visits (Telemedicine).  Patients are able to view lab/test results, encounter notes, upcoming appointments, etc.  Non-urgent messages can be sent to your provider as well.   To learn more about what you can do with MyChart, go to  [email or web address].    Your next appointment:   Follow up in 3 months after Echo   The format for your next appointment:   In Person  Provider:   Debbe Odea, MD   Other Instructions

## 2021-07-04 LAB — BASIC METABOLIC PANEL
BUN/Creatinine Ratio: 12 (ref 9–20)
BUN: 16 mg/dL (ref 6–24)
CO2: 24 mmol/L (ref 20–29)
Calcium: 9.5 mg/dL (ref 8.7–10.2)
Chloride: 99 mmol/L (ref 96–106)
Creatinine, Ser: 1.3 mg/dL — ABNORMAL HIGH (ref 0.76–1.27)
Glucose: 397 mg/dL — ABNORMAL HIGH (ref 65–99)
Potassium: 5 mmol/L (ref 3.5–5.2)
Sodium: 134 mmol/L (ref 134–144)
eGFR: 69 mL/min/{1.73_m2} (ref >59)

## 2021-07-06 ENCOUNTER — Other Ambulatory Visit: Payer: Self-pay

## 2021-07-06 MED ORDER — ATORVASTATIN CALCIUM 40 MG PO TABS: 40.0000 mg | ORAL_TABLET | Freq: Every day | ORAL | 0 refills | Status: DC

## 2021-07-09 NOTE — Progress Notes (Deleted)
New Patient Note  RE: [name] MRN: [id number] DOB: April 30, 1976 Date of Office Visit: 07/10/2021  Consult requested by: No ref. provider found Primary care provider: Emi Belfast, FNP  Chief Complaint: No chief complaint on file.  History of Present Illness: I had the pleasure of seeing [name] for initial evaluation at the [facility] on 07/09/2021. He is a 45 y.o. male, who is referred here by Emi Belfast, FNP for the evaluation of ***.  ***  Assessment and Plan: [name] is a 45 y.o. male with: No problem-specific Assessment & Plan notes found for this encounter.  No follow-ups on file.  No orders of the defined types were placed in this encounter.  Lab Orders  No laboratory test(s) ordered today    Other allergy screening: Asthma: {Blank single:19197::"yes","no"} Rhino conjunctivitis: {Blank single:19197::"yes","no"} Food allergy: {Blank single:19197::"yes","no"} Medication allergy: {Blank single:19197::"yes","no"} Hymenoptera allergy: {Blank single:19197::"yes","no"} Urticaria: {Blank single:19197::"yes","no"} Eczema:{Blank single:19197::"yes","no"} History of recurrent infections suggestive of immunodeficency: {Blank single:19197::"yes","no"}  Diagnostics: Spirometry:  Tracings reviewed. His effort: {Blank single:19197::"Good reproducible efforts.","It was hard to get consistent efforts and there is a question as to whether this reflects a maximal maneuver.","Poor effort, data can not be interpreted."} FVC: ***L FEV1: ***L, ***% predicted FEV1/FVC ratio: ***% Interpretation: {Blank single:19197::"Spirometry consistent with mild obstructive disease","Spirometry consistent with moderate obstructive disease","Spirometry consistent with severe obstructive disease","Spirometry consistent with possible restrictive disease","Spirometry consistent with mixed obstructive and restrictive disease","Spirometry uninterpretable due to  technique","Spirometry consistent with normal pattern","No overt abnormalities noted given today's efforts"}.  Please see scanned spirometry results for details.  Skin Testing: {Blank single:19197::"Select foods","Environmental allergy panel","Environmental allergy panel and select foods","Food allergy panel","None","Deferred due to recent antihistamines use"}. *** Results discussed with patient/family.   Past Medical History: Patient Active Problem List   Diagnosis Date Noted  . Gastroesophageal reflux disease 06/15/2020  . Dysphagia 06/15/2020  . Sickle cell trait (HCC) 05/03/2020  . Hypertensive heart disease with heart failure (HCC) 09/19/2019  . Dilated cardiomyopathy (HCC) 09/19/2019  . Acute systolic heart failure (HCC)   . Acute CHF (HCC) 09/14/2019  . Acute gangrenous appendicitis 03/30/2019  . Appendicitis 03/29/2019  . Chest pressure 02/10/2019  . Cough 02/10/2019  . Diabetic polyneuropathy associated with type 2 diabetes mellitus (HCC) 07/21/2018  . S/P arthroscopy of shoulder 06/01/2014  . Knee pain 06/29/2013  . Pain in joint, shoulder region 06/29/2013  . Allergic rhinitis, cause unspecified 03/17/2013  . Essential hypertension 03/17/2013   Past Medical History:  Diagnosis Date  . CHF (congestive heart failure) (HCC)   . CKD (chronic kidney disease), stage III (HCC)   . Diabetes mellitus   . ED (erectile dysfunction)   . Hypertension    Past Surgical History: Past Surgical History:  Procedure Laterality Date  . ANKLE SURGERY     both   . APPENDECTOMY    . back fusion     l4,l5  . BACK SURGERY    . LAPAROSCOPIC APPENDECTOMY N/A 03/28/2019   Procedure: APPENDECTOMY LAPAROSCOPIC;  Surgeon: Sung Amabile, DO;  Location: [facility] ORS;  Service: General;  Laterality: N/A;  . NASAL SINUS SURGERY     Medication List:  Current Outpatient Medications  Medication Sig Dispense Refill  . AMBULATORY NON FORMULARY MEDICATION Trimix (30/1/10)-(Pap/Phent/PGE)  Test Dose   30ml vial   Qty #3 Refills 0  [facility] [phone] Fax [phone] 3 mL 0  . atorvastatin (LIPITOR) 40 MG tablet Take 1 tablet (40 mg total) by mouth daily at  6 PM. 90 tablet 0  . Blood Pressure Monitoring (BLOOD PRESSURE MONITOR/L CUFF) MISC 1 Units by Does not apply route as needed. 1 each 0  . carvedilol (COREG) 6.25 MG tablet Take 1 tablet (6.25 mg total) by mouth 2 (two) times daily. 180 tablet 3  . NOVOLIN 70/30 FLEXPEN (70-30) 100 UNIT/ML PEN Inject into the skin 2 (two) times daily. Sliding scale    . pregabalin (LYRICA) 50 MG capsule Take 50 mg by mouth in the morning and at bedtime.    Marland Kitchen rOPINIRole (REQUIP) 0.25 MG tablet Take 1 tablet by mouth 3 (three) times daily.    . sacubitril-valsartan (ENTRESTO) 97-103 MG Take 1 tablet by mouth 2 (two) times daily. 60 tablet 5  . spironolactone (ALDACTONE) 25 MG tablet Take 1 tablet (25 mg total) by mouth daily. 30 tablet 3   No current facility-administered medications for this visit.   Allergies: Allergies  Allergen Reactions  . Food     Chicken, eggs and peaches   Social History: Social History   Socioeconomic History  . Marital status: Married    Spouse name: Not on file  . Number of children: Not on file  . Years of education: Not on file  . Highest education level: Not on file  Occupational History  . Occupation: unemployed  Tobacco Use  . Smoking status: Never  . Smokeless tobacco: Never  Vaping Use  . Vaping Use: Never used  Substance and Sexual Activity  . Alcohol use: No  . Drug use: No  . Sexual activity: Yes    Birth control/protection: None  Other Topics Concern  . Not on file  Social History Narrative  . Not on file   Social Determinants of Health   Financial Resource Strain: Not on file  Food Insecurity: Not on file  Transportation Needs: Not on file  Physical Activity: Not on file  Stress: Not on file  Social Connections: Not on file   Lives in a ***. Smoking: *** Occupation:  ***  Environmental HistorySurveyor, minerals in the house: Copywriter, advertising in the family room: {Blank single:19197::"yes","no"} Carpet in the bedroom: {Blank single:19197::"yes","no"} Heating: {Blank single:19197::"electric","gas","heat pump"} Cooling: {Blank single:19197::"central","window","heat pump"} Pet: {Blank single:19197::"yes ***","no"}  Family History: Family History  Problem Relation Age of Onset  . Hypertension Mother   . Diabetes Father    Problem                               Relation Asthma                                   *** Eczema                                *** Food allergy                          *** Allergic rhino conjunctivitis     ***  Review of Systems  Constitutional:  Negative for appetite change, chills, fever and unexpected weight change.  HENT:  Negative for congestion and rhinorrhea.   Eyes:  Negative for itching.  Respiratory:  Negative for cough, chest tightness, shortness of breath and wheezing.   Cardiovascular:  Negative for chest pain.  Gastrointestinal:  Negative for abdominal  pain.  Genitourinary:  Negative for difficulty urinating.  Skin:  Negative for rash.  Neurological:  Negative for headaches.   Objective: There were no vitals taken for this visit. There is no height or weight on file to calculate BMI. Physical Exam Vitals and nursing note reviewed.  Constitutional:      Appearance: Normal appearance. He is well-developed.  HENT:     Head: Normocephalic and atraumatic.     Right Ear: External ear normal.     Left Ear: External ear normal.     Nose: Nose normal.     Mouth/Throat:     Mouth: Mucous membranes are moist.     Pharynx: Oropharynx is clear.  Eyes:     Conjunctiva/sclera: Conjunctivae normal.  Cardiovascular:     Rate and Rhythm: Normal rate and regular rhythm.     Heart sounds: Normal heart sounds. No murmur heard.   No friction rub. No gallop.  Pulmonary:     Effort: Pulmonary  effort is normal.     Breath sounds: Normal breath sounds. No wheezing, rhonchi or rales.  Abdominal:     Palpations: Abdomen is soft.  Musculoskeletal:     Cervical back: Neck supple.  Skin:    General: Skin is warm.     Findings: No rash.  Neurological:     Mental Status: He is alert and oriented to person, place, and time.  Psychiatric:        Behavior: Behavior normal.  The plan was reviewed with the patient/family, and all questions/concerned were addressed.  It was my pleasure to see Coury today and participate in his care. Please feel free to contact me with any questions or concerns.  Sincerely,  Wyline Mood, DO Allergy & Immunology  [facility] office: [phone] [facility] office: [phone]

## 2021-07-10 ENCOUNTER — Ambulatory Visit: Payer: Medicare Other | Admitting: Allergy

## 2021-07-10 NOTE — Progress Notes (Signed)
This encounter was created in error - please disregard.

## 2021-07-31 ENCOUNTER — Other Ambulatory Visit: Payer: Self-pay | Admitting: Urology

## 2021-08-08 NOTE — Progress Notes (Deleted)
08/09/2021 1:13 PM   [name] 04/12/1976 [id number]  Referring provider: Emi Belfast, FNP [address] [place],  [place] [address]  Urological history: 1. ED -contributing factors of age, CKD, diabetes, HTN, sickle cell trait and CHF -SHIM *** -managed with [facility]   No chief complaint on file.   HPI: [name] is a 45 y.o. male who presents today for one year follow up.   PMH: Past Medical History:  Diagnosis Date   CHF (congestive heart failure) (HCC)    CKD (chronic kidney disease), stage III (HCC)    Diabetes mellitus    ED (erectile dysfunction)    Hypertension     Surgical History: Past Surgical History:  Procedure Laterality Date   ANKLE SURGERY     both    APPENDECTOMY     back fusion     l4,l5   BACK SURGERY     LAPAROSCOPIC APPENDECTOMY N/A 03/28/2019   Procedure: APPENDECTOMY LAPAROSCOPIC;  Surgeon: Sung Amabile, DO;  Location: [facility] ORS;  Service: General;  Laterality: N/A;   NASAL SINUS SURGERY      Home Medications:  Allergies as of 08/09/2021       Reactions   Food    Chicken, eggs and peaches        Medication List        Accurate as of August 08, 2021  1:13 PM. If you have any questions, ask your nurse or doctor.          AMBULATORY NON FORMULARY MEDICATION Trimix (30/1/10)-(Pap/Phent/PGE)  Test Dose  65ml vial   Qty #3 Refills 0  [facility] [phone] Fax [phone]   atorvastatin 40 MG tablet Commonly known as: LIPITOR Take 1 tablet (40 mg total) by mouth daily at 6 PM.   Blood Pressure Monitor/L Cuff Misc 1 Units by Does not apply route as needed.   carvedilol 6.25 MG tablet Commonly known as: COREG Take 1 tablet (6.25 mg total) by mouth 2 (two) times daily.   Entresto 97-103 MG Generic drug: sacubitril-valsartan Take 1 tablet by mouth 2 (two) times daily.   NovoLIN 70/30 Kwikpen (70-30) 100 UNIT/ML KwikPen Generic drug: insulin isophane & regular human KwikPen Inject  into the skin 2 (two) times daily. Sliding scale   pregabalin 50 MG capsule Commonly known as: LYRICA Take 50 mg by mouth in the morning and at bedtime.   rOPINIRole 0.25 MG tablet Commonly known as: REQUIP Take 1 tablet by mouth 3 (three) times daily.   spironolactone 25 MG tablet Commonly known as: ALDACTONE Take 1 tablet (25 mg total) by mouth daily.        Allergies:  Allergies  Allergen Reactions   Food     Chicken, eggs and peaches    Family History: Family History  Problem Relation Age of Onset   Hypertension Mother    Diabetes Father     Social History:  reports that he has never smoked. He has never used smokeless tobacco. He reports that he does not drink alcohol and does not use drugs.  ROS: Pertinent ROS in HPI  Physical Exam: There were no vitals taken for this visit.  Constitutional:  Well nourished. Alert and oriented, No acute distress. HEENT: [place] AT, moist mucus membranes.  Trachea midline, no masses. Cardiovascular: No clubbing, cyanosis, or edema. Respiratory: Normal respiratory effort, no increased work of breathing. GI: Abdomen is soft, non tender, non distended, no abdominal masses. Liver and spleen not palpable.  No hernias appreciated.  Stool sample for occult testing is not indicated.   GU: No CVA tenderness.  No bladder fullness or masses.  Patient with circumcised/uncircumcised phallus. ***Foreskin easily retracted***  Urethral meatus is patent.  No penile discharge. No penile lesions or rashes. Scrotum without lesions, cysts, rashes and/or edema.  Testicles are located scrotally bilaterally. No masses are appreciated in the testicles. Left and right epididymis are normal. Rectal: Patient with  normal sphincter tone. Anus and perineum without scarring or rashes. No rectal masses are appreciated. Prostate is approximately *** grams, *** nodules are appreciated. Seminal vesicles are normal. Skin: No rashes, bruises or suspicious lesions. Lymph:  No cervical or inguinal adenopathy. Neurologic: Grossly intact, no focal deficits, moving all 4 extremities. Psychiatric: Normal mood and affect.  Laboratory Data: Lab Results  Component Value Date   CREATININE 1.30 (H) 07/03/2021  I have reviewed the labs.   Pertinent Imaging: N/A  Assessment & Plan:    1. ED  No follow-ups on file.  These notes generated with voice recognition software. I apologize for typographical errors.  Michiel Cowboy, PA-C  [facility] [address]  [address] [place], [place] [address] [phone]

## 2021-08-09 ENCOUNTER — Ambulatory Visit: Payer: Medicare Other | Admitting: Urology

## 2021-08-09 ENCOUNTER — Other Ambulatory Visit: Payer: Self-pay | Admitting: *Deleted

## 2021-08-09 DIAGNOSIS — N5201 Erectile dysfunction due to arterial insufficiency: Secondary | ICD-10-CM

## 2021-08-09 MED ORDER — SPIRONOLACTONE 25 MG PO TABS: 25.0000 mg | ORAL_TABLET | Freq: Every day | ORAL | 2 refills | Status: DC

## 2021-08-10 ENCOUNTER — Encounter: Payer: Self-pay | Admitting: Urology

## 2021-10-06 ENCOUNTER — Telehealth: Payer: Self-pay | Admitting: Cardiology

## 2021-10-06 ENCOUNTER — Other Ambulatory Visit: Payer: Self-pay

## 2021-10-06 ENCOUNTER — Ambulatory Visit (INDEPENDENT_AMBULATORY_CARE_PROVIDER_SITE_OTHER): Payer: Medicare Other

## 2021-10-06 DIAGNOSIS — I428 Other cardiomyopathies: Secondary | ICD-10-CM | POA: Diagnosis not present

## 2021-10-06 LAB — ECHOCARDIOGRAM COMPLETE
AR max vel: 2.79 cm2
AV Area VTI: 2.94 cm2
AV Area mean vel: 2.17 cm2
AV Mean grad: 3 mmHg
AV Peak grad: 5.7 mmHg
Ao pk vel: 1.19 m/s
Area-P 1/2: 2.47 cm2
Calc EF: 43.2 %
S' Lateral: 3.8 cm
Single Plane A2C EF: 38.2 %
Single Plane A4C EF: 44.8 %

## 2021-10-06 NOTE — Telephone Encounter (Signed)
Spoke to pt.  States his handicap placard expires end of November 2022.  Requests renewal.  Placed form in Dr. Merita Norton office.  Pt states that he will pick up at our office when completed.

## 2021-10-06 NOTE — Telephone Encounter (Signed)
Patient came by office for echo States he will need his handicap placard renewed Did not have paperwork Please call to discuss

## 2021-10-09 NOTE — Telephone Encounter (Signed)
Notified pt form has been signed by Dr. Azucena Cecil and is ready at the front desk for him to pick up at his convenience.  Pt appreciative and voiced understanding.

## 2021-10-16 ENCOUNTER — Ambulatory Visit: Payer: Medicare Other | Admitting: Cardiology

## 2021-10-19 ENCOUNTER — Other Ambulatory Visit: Payer: Self-pay

## 2021-10-19 ENCOUNTER — Ambulatory Visit (INDEPENDENT_AMBULATORY_CARE_PROVIDER_SITE_OTHER): Payer: Medicare Other | Admitting: Cardiology

## 2021-10-19 ENCOUNTER — Encounter: Payer: Self-pay | Admitting: Cardiology

## 2021-10-19 VITALS — BP 130/94 | HR 85 | Ht 73.0 in | Wt 230.0 lb

## 2021-10-19 DIAGNOSIS — I10 Essential (primary) hypertension: Secondary | ICD-10-CM

## 2021-10-19 DIAGNOSIS — I428 Other cardiomyopathies: Secondary | ICD-10-CM

## 2021-10-19 NOTE — Progress Notes (Signed)
Cardiology Office Note:    Date:  10/19/2021   ID:  [name], DOB 1976-10-17, MRN [id number]  PCP:  Elby Beck, FNP  Cardiologist:  Kate Sable, MD  Electrophysiologist:  None   Referring MD: Elby Beck, FNP   Chief Complaint  Patient presents with   Other    3 month follow up post ECHO -- Meds reviewed verbally with patient.      History of Present Illness:    [name] is a 45 y.o. male with a hx of hypertension, diabetes, hyperlipidemia, NICM initial EF <20 improved to 40-45% who presents for follow-up.    Patient is being seen for nonischemic cardiomyopathy and medication titration.  Entresto was increased after last visit 97-100 mg twice daily.  Tolerating current doses of Coreg and Aldactone.  Denies chest pain, shortness of breath, edema.  Repeat echocardiogram was performed to evaluate cardiac function.  He feels well, has no concerns at this time.     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  His insulin regimen was recently titrated by primary care provider.    Past Medical History:  Diagnosis Date   CHF (congestive heart failure) (HCC)    CKD (chronic kidney disease), stage III (HCC)    Diabetes mellitus    ED (erectile dysfunction)    Hypertension     Past Surgical History:  Procedure Laterality Date   ANKLE SURGERY     both    APPENDECTOMY     back fusion     l4,l5   BACK SURGERY     LAPAROSCOPIC APPENDECTOMY N/A 03/28/2019   Procedure: APPENDECTOMY LAPAROSCOPIC;  Surgeon: Benjamine Sprague, DO;  Location: [facility] ORS;  Service: General;  Laterality: N/A;   NASAL SINUS SURGERY      Current Medications: Current Meds  Medication Sig   atorvastatin (LIPITOR) 40 MG tablet Take 1 tablet (40 mg total) by mouth daily at 6 PM.   Blood Pressure Monitoring (BLOOD PRESSURE MONITOR/L  CUFF) MISC 1 Units by Does not apply route as needed.   carvedilol (COREG) 6.25 MG tablet Take 1 tablet (6.25 mg total) by mouth 2 (two) times daily.   DULoxetine (CYMBALTA) 20 MG capsule Take 2 capsules by mouth daily.   NOVOLIN 70/30 FLEXPEN (70-30) 100 UNIT/ML PEN Inject into the skin 2 (two) times daily. Sliding scale   pregabalin (LYRICA) 50 MG capsule Take 50 mg by mouth in the morning and at bedtime.   rOPINIRole (REQUIP) 0.25 MG tablet Take 1 tablet by mouth 3 (three) times daily.   sacubitril-valsartan (ENTRESTO) 97-103 MG Take 1 tablet by mouth 2 (two) times daily.   spironolactone (ALDACTONE) 25 MG tablet Take 1 tablet (25 mg total) by mouth daily.     Allergies:   Food   Social History   Socioeconomic History   Marital status: Married    Spouse name: Not on file   Number of children: Not on file   Years of education: Not on file   Highest education level: Not on file  Occupational History   Occupation: unemployed  Tobacco Use   Smoking status: Never   Smokeless tobacco: Never  Vaping Use   Vaping Use: Never used  Substance and Sexual Activity   Alcohol use: No   Drug use: No   Sexual activity: Yes  Birth control/protection: None  Other Topics Concern   Not on file  Social History Narrative   Not on file   Social Determinants of Health   Financial Resource Strain: Not on file  Food Insecurity: Not on file  Transportation Needs: Not on file  Physical Activity: Not on file  Stress: Not on file  Social Connections: Not on file     Family History: The patient's family history includes Diabetes in his father; Hypertension in his mother.  ROS:   Please see the history of present illness.     All other systems reviewed and are negative.  EKGs/Labs/Other Studies Reviewed:    The following studies were reviewed today:   EKG:  EKG is ordered today.  EKG shows normal sinus rhythm, normal ECG.  Recent Labs: 07/03/2021: BUN 16; Creatinine, Ser 1.30;  Potassium 5.0; Sodium 134  Recent Lipid Panel    Component Value Date/Time   CHOL 118 09/15/2019 0533   TRIG 49 09/15/2019 0533   HDL 48 09/15/2019 0533   CHOLHDL 2.5 09/15/2019 0533   VLDL 10 09/15/2019 0533   LDLCALC 60 09/15/2019 0533    Physical Exam:    VS:  BP (!) 130/94 (BP Location: Left Arm, Patient Position: Sitting, Cuff Size: Normal)   Pulse 85   Ht 6\' 1"  (1.854 m)   Wt 230 lb (104.3 kg)   SpO2 97%   BMI 30.34 kg/m     Wt Readings from Last 3 Encounters:  10/19/21 230 lb (104.3 kg)  07/03/21 226 lb (102.5 kg)  02/20/21 236 lb (107 kg)     GEN:  Well nourished, well developed in no acute distress HEENT: Normal NECK: No JVD; No carotid bruits LYMPHATICS: No lymphadenopathy CARDIAC: RRR, no murmurs, rubs, gallops RESPIRATORY:  Clear to auscultation without rales, wheezing or rhonchi  ABDOMEN: Soft, non-tender, non-distended MUSCULOSKELETAL:  No edema; No deformity  SKIN: Warm and dry NEUROLOGIC:  Alert and oriented x 3 PSYCHIATRIC:  Normal affect   ASSESSMENT:   . 1. NICM (nonischemic cardiomyopathy) (HCC)   2. Primary hypertension    PLAN:     1. 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  Patient describes NYHA class I-II symptoms.  His original EF was less than 20%.  Recent echo with improvement in cardiac function, EF now 45 to 50%.   Continue Entresto to 97/103 mg twice daily Coreg 6.25 mg twice daily, Aldactone 25 mg daily.   2.  History of hypertension.  BP elevated.  Increase Entresto as above.  Continue Coreg, Aldactone.  Follow-up in 6 months after repeat echocardiogram.   Medication Adjustments/Labs and Tests Ordered: Current medicines are reviewed at length with the patient today.  Concerns regarding medicines are outlined above.  No orders of the defined types were placed in this encounter.   No orders of the defined types were placed in this encounter.    Patient Instructions   Medication Instructions:  Your physician recommends that you continue on your current medications as directed. Please refer to the Current Medication list given to you today.  *If you need a refill on your cardiac medications before your next appointment, please call your pharmacy*   Lab Work: None ordered If you have labs (blood work) drawn today and your tests are completely normal, you will receive your results only by: MyChart Message (if you have MyChart) OR A paper copy in the mail If you have any lab test that is abnormal or we need  to change your treatment, we will call you to review the results.   Testing/Procedures: None ordered   Follow-Up: At [facility], you and your health needs are our priority.  As part of our continuing mission to provide you with exceptional heart care, we have created designated Provider Care Teams.  These Care Teams include your primary Cardiologist (physician) and Advanced Practice Providers (APPs -  Physician Assistants and Nurse Practitioners) who all work together to provide you with the care you need, when you need it.  We recommend signing up for the patient portal called "MyChart".  Sign up information is provided on this After Visit Summary.  MyChart is used to connect with patients for Virtual Visits (Telemedicine).  Patients are able to view lab/test results, encounter notes, upcoming appointments, etc.  Non-urgent messages can be sent to your provider as well.   To learn more about what you can do with MyChart, go to [email or web address].    Your next appointment:   6 month(s)  The format for your next appointment:   In Person  Provider:   You may see Kate Sable, MD or one of the following Advanced Practice Providers on your designated Care Team:   Murray Hodgkins, NP Christell Faith, PA-C Cadence Kathlen Mody, [place]    Other Instructions    Signed, Kate Sable, MD  10/19/2021 12:21 PM    [place]

## 2021-10-19 NOTE — Patient Instructions (Signed)
Medication Instructions:  ? ?Your physician recommends that you continue on your current medications as directed. Please refer to the Current Medication list given to you today. ? ?*If you need a refill on your cardiac medications before your next appointment, please call your pharmacy* ? ? ?Lab Work: ? ?None ordered ? ?If you have labs (blood work) drawn today and your tests are completely normal, you will receive your results only by: ?MyChart Message (if you have MyChart) OR ?A paper copy in the mail ?If you have any lab test that is abnormal or we need to change your treatment, we will call you to review the results. ? ? ?Testing/Procedures: ? ?None ordered ? ? ?Follow-Up: ?At [facility], you and your health needs are our priority.  As part of our continuing mission to provide you with exceptional heart care, we have created designated Provider Care Teams.  These Care Teams include your primary Cardiologist (physician) and Advanced Practice Providers (APPs -  Physician Assistants and Nurse Practitioners) who all work together to provide you with the care you need, when you need it. ? ?We recommend signing up for the patient portal called "MyChart".  Sign up information is provided on this After Visit Summary.  MyChart is used to connect with patients for Virtual Visits (Telemedicine).  Patients are able to view lab/test results, encounter notes, upcoming appointments, etc.  Non-urgent messages can be sent to your provider as well.   ?To learn more about what you can do with MyChart, go to [email or web address].   ? ?Your next appointment:   ?6 month(s) ? ?The format for your next appointment:   ?In Person ? ?Provider:   ?You may see Brian Agbor-Etang, MD or one of the following Advanced Practice Providers on your designated Care Team:   ?Christopher Berge, NP ?Ryan Dunn, PA-C ?Cadence Furth, PA-C  ? ? ?Other Instructions ? ? ?

## 2021-10-20 LAB — HEMOGLOBIN A1C: Hemoglobin A1C: 12.3

## 2021-12-04 ENCOUNTER — Other Ambulatory Visit: Payer: Self-pay | Admitting: *Deleted

## 2021-12-04 MED ORDER — ATORVASTATIN CALCIUM 40 MG PO TABS: 40.0000 mg | ORAL_TABLET | Freq: Every day | ORAL | 2 refills | Status: AC

## 2021-12-04 MED ORDER — SPIRONOLACTONE 25 MG PO TABS: 25.0000 mg | ORAL_TABLET | Freq: Every day | ORAL | 2 refills | Status: DC

## 2021-12-08 LAB — HM DIABETES EYE EXAM

## 2022-01-25 ENCOUNTER — Ambulatory Visit (INDEPENDENT_AMBULATORY_CARE_PROVIDER_SITE_OTHER): Payer: Medicare Other | Admitting: Nurse Practitioner

## 2022-01-25 ENCOUNTER — Encounter: Payer: Self-pay | Admitting: Internal Medicine

## 2022-01-25 ENCOUNTER — Other Ambulatory Visit: Payer: Self-pay

## 2022-01-25 ENCOUNTER — Encounter: Payer: Self-pay | Admitting: Nurse Practitioner

## 2022-01-25 VITALS — BP 134/96 | HR 74 | Temp 97.3°F | Resp 14 | Ht 73.0 in | Wt 226.1 lb

## 2022-01-25 DIAGNOSIS — I110 Hypertensive heart disease with heart failure: Secondary | ICD-10-CM | POA: Diagnosis not present

## 2022-01-25 DIAGNOSIS — I10 Essential (primary) hypertension: Secondary | ICD-10-CM

## 2022-01-25 DIAGNOSIS — Z1211 Encounter for screening for malignant neoplasm of colon: Secondary | ICD-10-CM

## 2022-01-25 DIAGNOSIS — K219 Gastro-esophageal reflux disease without esophagitis: Secondary | ICD-10-CM

## 2022-01-25 DIAGNOSIS — Z8042 Family history of malignant neoplasm of prostate: Secondary | ICD-10-CM

## 2022-01-25 DIAGNOSIS — E1142 Type 2 diabetes mellitus with diabetic polyneuropathy: Secondary | ICD-10-CM | POA: Diagnosis not present

## 2022-01-25 DIAGNOSIS — L84 Corns and callosities: Secondary | ICD-10-CM

## 2022-01-25 LAB — COMPREHENSIVE METABOLIC PANEL
ALT: 15 U/L (ref 0–53)
AST: 14 U/L (ref 0–37)
Albumin: 4.5 g/dL (ref 3.5–5.2)
Alkaline Phosphatase: 62 U/L (ref 39–117)
BUN: 16 mg/dL (ref 6–23)
CO2: 30 mEq/L (ref 19–32)
Calcium: 9.2 mg/dL (ref 8.4–10.5)
Chloride: 107 mEq/L (ref 96–112)
Creatinine, Ser: 1.15 mg/dL (ref 0.40–1.50)
GFR: 76.41 mL/min (ref >60.00)
Glucose, Bld: 101 mg/dL — ABNORMAL HIGH (ref 70–99)
Potassium: 4.1 mEq/L (ref 3.5–5.1)
Sodium: 143 mEq/L (ref 135–145)
Total Bilirubin: 0.8 mg/dL (ref 0.2–1.2)
Total Protein: 7.2 g/dL (ref 6.0–8.3)

## 2022-01-25 LAB — CBC
HCT: 46.3 % (ref 39.0–52.0)
Hemoglobin: 15.3 g/dL (ref 13.0–17.0)
MCHC: 32.9 g/dL (ref 30.0–36.0)
MCV: 85.3 fl (ref 78.0–100.0)
Platelets: 161 10*3/uL (ref 150.0–400.0)
RBC: 5.43 Mil/uL (ref 4.22–5.81)
RDW: 13.8 % (ref 11.5–15.5)
WBC: 5.9 10*3/uL (ref 4.0–10.5)

## 2022-01-25 MED ORDER — PANTOPRAZOLE SODIUM 20 MG PO TBEC: 20.0000 mg | DELAYED_RELEASE_TABLET | Freq: Every day | ORAL | 0 refills | Status: DC

## 2022-01-25 MED ORDER — PEG 3350-KCL-NA BICARB-NACL 420 G PO SOLR: 4000.0000 mL | Freq: Once | ORAL | 0 refills | Status: AC

## 2022-01-25 NOTE — Assessment & Plan Note (Addendum)
Patient is managed by cardiology.  Sees them once yearly now.  Patient maintained on Entresto, spironolactone, and carvedilol.  Take medications as prescribed and follow-up with cardiology clinic as recommended. ?

## 2022-01-25 NOTE — Progress Notes (Signed)
Established Patient Office Visit  Subjective:  Patient ID: [name], male    DOB: 21-May-1976  Age: 46 y.o. MRN: [id number]  CC:  Chief Complaint  Patient presents with   Transfer of Care    HPI [name] presents for [facility]  HTN: Does not check blood pressure at home. States that his cuff needs new batteries  CHF Brain Agbor-Etang once a year.  Currently maintained on Entresto, carvedilol, and spironolactone.  GERD: States that he went and got some Prilosec. Took 2 weeks and felt better with Prilosec. States that it came back after he finished. States that his stomach bloating and would limit to eating once a day. States he will go to restroom several times and have different types of stools. Has been using a probiotic for the past 3 months. Has tried different ones. No history of H. pylori. Cannot correlate with food choices. He does not drink alcohol.    DM with polyneuropathy: [name] every 3 months. Manjauro and tresbia. Checks glucose BID Walk through out the week more than once a week. Will do some push ups daily. [name]   Immunizations: -Tetanus: 2015 -Influenza:08/2021 -Covid-19: original too -Shingles: NA -Pneumonia:   -HPV: NA   Exercise: See above  Eye exam: Completes annually [name] 12/08/2021 Dental exam: Completes semi-annually   Colonoscopy: Has not done it yet will refer to [place]  PSA: Second cousin has prostate issues. Patient does not have nay symptoms or first degree relatives with prostate issues to his knowledge  Lung Cancer Screening: NA  Past Medical History:  Diagnosis Date   CHF (congestive heart failure) ([place])    CKD (chronic kidney disease), stage III ([place])    Diabetes mellitus    ED (erectile dysfunction)    Hypertension     Past Surgical History:  Procedure Laterality Date   ANKLE SURGERY     both    APPENDECTOMY     back fusion     l4,l5   BACK SURGERY     LAPAROSCOPIC APPENDECTOMY N/A 03/28/2019    Procedure: APPENDECTOMY LAPAROSCOPIC;  Surgeon: Benjamine Sprague, DO;  Location: [facility] ORS;  Service: General;  Laterality: N/A;   NASAL SINUS SURGERY     SHOULDER ARTHROSCOPY WITH LABRAL REPAIR Left     Family History  Problem Relation Age of Onset   Cancer Mother 68       breast cancer   Hypertension Mother    Diabetes Father     Social History   Socioeconomic History   Marital status: Married    Spouse name: Not on file   Number of children: 4   Years of education: Not on file   Highest education level: Not on file  Occupational History   Occupation: unemployed  Tobacco Use   Smoking status: Never   Smokeless tobacco: Never  Vaping Use   Vaping Use: Never used  Substance and Sexual Activity   Alcohol use: No   Drug use: No   Sexual activity: Yes    Birth control/protection: None  Other Topics Concern   Not on file  Social History Narrative   Self employed      Hobbies: Basketball couch    Social Determinants of Health   Financial Resource Strain: Not on file  Food Insecurity: Not on file  Transportation Needs: Not on file  Physical Activity: Not on file  Stress: Not on file  Social Connections: Not on file  Intimate Partner  Violence: Not on file    Outpatient Medications Prior to Visit  Medication Sig Dispense Refill   atorvastatin (LIPITOR) 40 MG tablet Take 1 tablet (40 mg total) by mouth daily at 6 PM. 90 tablet 2   carvedilol (COREG) 6.25 MG tablet Take 1 tablet (6.25 mg total) by mouth 2 (two) times daily. 180 tablet 3   DULoxetine (CYMBALTA) 20 MG capsule Take 2 capsules by mouth daily.     MOUNJARO 2.5 MG/0.5ML Pen Inject 1.5 mg into the skin once a week.     pregabalin (LYRICA) 50 MG capsule Take 50 mg by mouth in the morning and at bedtime.     rOPINIRole (REQUIP) 0.25 MG tablet Take 1 tablet by mouth 3 (three) times daily.     sacubitril-valsartan (ENTRESTO) 97-103 MG Take 1 tablet by mouth 2 (two) times daily. 60 tablet 5   sildenafil (VIAGRA)  100 MG tablet Take 100 mg by mouth daily as needed.     spironolactone (ALDACTONE) 25 MG tablet Take 1 tablet (25 mg total) by mouth daily. 30 tablet 2   TRESIBA FLEXTOUCH 200 UNIT/ML FlexTouch Pen 35 units once daily     AMBULATORY NON FORMULARY MEDICATION Trimix (30/1/10)-(Pap/Phent/PGE)  Test Dose  77m vial   Qty [place]3 [id number] [phone] [phone](Patient not taking: Reported on 10/19/2021) 3 mL 0   Blood Pressure Monitoring (BLOOD PRESSURE MONITOR/L CUFF) MISC 1 Units by Does not apply route as needed. 1 each 0   NOVOLIN 70/30 FLEXPEN (70-30) 100 UNIT/ML PEN Inject into the skin 2 (two) times daily. Sliding scale     No facility-administered medications prior to visit.    Allergies  Allergen Reactions   Food     Chicken, eggs and peaches    ROS Review of Systems  Constitutional:  Negative for chills, fatigue and fever.  Respiratory:  Negative for cough and shortness of breath.   Cardiovascular:  Negative for chest pain.  Gastrointestinal:  Positive for constipation and diarrhea. Negative for nausea and vomiting.  Genitourinary:  Negative for difficulty urinating.  Neurological:  Negative for dizziness, light-headedness and headaches.  Psychiatric/Behavioral:  Negative for hallucinations and suicidal ideas.      Objective:    Physical Exam Vitals and nursing note reviewed.  Constitutional:      Appearance: Normal appearance.  HENT:     Right Ear: Tympanic membrane, ear canal and external ear normal.     Left Ear: Ear canal and external ear normal. There is impacted cerumen.     Mouth/Throat:     Mouth: Mucous membranes are moist.     Pharynx: Oropharynx is clear.  Eyes:     Extraocular Movements: Extraocular movements intact.     Pupils: Pupils are equal, round, and reactive to light.  Neck:     Thyroid: No thyroid mass, thyromegaly or thyroid tenderness.  Cardiovascular:     Rate and Rhythm: Normal rate and regular rhythm.      Pulses: Normal pulses.     Heart sounds: Normal heart sounds.  Pulmonary:     Effort: Pulmonary effort is normal.     Breath sounds: Normal breath sounds.  Abdominal:     General: Bowel sounds are normal. There is no distension.     Palpations: There is no mass.     Tenderness: There is no abdominal tenderness.     Hernia: No hernia is present.  Musculoskeletal:     Right lower leg: No edema.  Left lower leg: No edema.       Feet:  Lymphadenopathy:     Cervical: No cervical adenopathy.  Skin:    General: Skin is warm.  Neurological:     General: No focal deficit present.     Mental Status: He is alert.     Comments: Bilateral upper and lower extremity strength 5/5  Psychiatric:        Mood and Affect: Mood normal.        Behavior: Behavior normal.        Thought Content: Thought content normal.        Judgment: Judgment normal.    BP (!) 134/96    Pulse 74    Temp (!) 97.3 F (36.3 C)    Resp 14    Ht 6' 1" (1.854 m)    Wt 226 lb 1 oz (102.5 kg)    SpO2 98%    BMI 29.83 kg/m  Wt Readings from Last 3 Encounters:  01/25/22 226 lb 1 oz (102.5 kg)  10/19/21 230 lb (104.3 kg)  07/03/21 226 lb (102.5 kg)     Health Maintenance Due  Topic Date Due   COVID-19 Vaccine (1) Never done   Hepatitis C Screening  Never done   COLONOSCOPY (Pts 45-72yr Insurance coverage will need to be confirmed)  Never done    There are no preventive care reminders to display for this patient.  Lab Results  Component Value Date   TSH 1.207 09/15/2019   Lab Results  Component Value Date   WBC 3.3 (L) 09/15/2019   HGB 13.9 09/15/2019   HCT 41.4 09/15/2019   MCV 83.6 09/15/2019   PLT 166 09/15/2019   Lab Results  Component Value Date   NA 134 07/03/2021   K 5.0 07/03/2021   CO2 24 07/03/2021   GLUCOSE 397 (H) 07/03/2021   BUN 16 07/03/2021   CREATININE 1.30 (H) 07/03/2021   BILITOT 0.6 09/21/2019   ALKPHOS 60 09/21/2019   AST 13 09/21/2019   ALT 27 09/21/2019   PROT 6.8  09/21/2019   ALBUMIN 4.3 09/21/2019   CALCIUM 9.5 07/03/2021   ANIONGAP 10 11/10/2019   EGFR 69 07/03/2021   GFR 51.49 (L) 09/21/2019   Lab Results  Component Value Date   CHOL 118 09/15/2019   Lab Results  Component Value Date   HDL 48 09/15/2019   Lab Results  Component Value Date   LDLCALC 60 09/15/2019   Lab Results  Component Value Date   TRIG 49 09/15/2019   Lab Results  Component Value Date   CHOLHDL 2.5 09/15/2019   Lab Results  Component Value Date   HGBA1C 12.3 10/20/2021      Assessment & Plan:   Problem List Items Addressed This Visit       Cardiovascular and Mediastinum   Essential hypertension    Currently maintained on carvedilol, Entresto, and spironolactone.  Does not check blood pressures at home.  Continue medications as directed and follow-up with cardiology as recommended      Relevant Medications   sildenafil (VIAGRA) 100 MG tablet   Other Relevant Orders   CBC   Comprehensive metabolic panel   Hypertensive heart disease with heart failure ([facility]    Patient is managed by cardiology.  Sees them once yearly now.  Patient maintained on Entresto, spironolactone, and carvedilol.  Take medications as prescribed and follow-up with cardiology clinic as recommended.      Relevant Medications   sildenafil (VIAGRA)  100 MG tablet   Other Relevant Orders   CBC   Comprehensive metabolic panel     Digestive   Gastroesophageal reflux disease - Primary    History of the same.  States he did try over-the-counter Prilosec that did help some with his GERD.  Patient does not drink alcohol did give list of information about food choices to improve GERD symptoms.  We will trial patient on Protonix 20 mg daily for 3 months.      Relevant Medications   pantoprazole (PROTONIX) 20 MG tablet     Endocrine   Diabetic polyneuropathy associated with type 2 diabetes mellitus ([place])    Currently maintained on [place].  Patient is followed by  [facility] endocrinology.  States he sees them every 3 months.  Continue checking sugars at home as directed, take medications as directed, and follow-up as recommended with endocrinology.      Relevant Medications   TRESIBA FLEXTOUCH 200 UNIT/ML FlexTouch Pen   MOUNJARO 2.5 MG/0.5ML Pen   Other Relevant Orders   CBC   Comprehensive metabolic panel     Musculoskeletal and Integument   Callus of foot    Is to be beginnings of a callus to right plantar surface of foot.  States he seen podiatry in the past for surgeries but not for routine foot care.  Did inform patient that if he would like to see podiatry to let me know and I can likely Dremel the callus down and off.      Other Visit Diagnoses     Screening for colon cancer       Relevant Orders   Ambulatory referral to Gastroenterology   Family history of prostate cancer           Meds ordered this encounter  Medications   pantoprazole (PROTONIX) 20 MG tablet    Sig: Take 1 tablet (20 mg total) by mouth daily.    Dispense:  90 tablet    Refill:  0    Order Specific Question:   Supervising Provider    Answer:   Loura Pardon A [[id number]]   This visit occurred during the SARS-CoV-2 public health emergency.  Safety protocols were in place, including screening questions prior to the visit, additional usage of staff PPE, and extensive cleaning of exam room while observing appropriate contact time as indicated for disinfecting solutions.   Follow-up: Return in about 6 months (around 07/28/2022) for AWV.    Romilda Garret, NP

## 2022-01-25 NOTE — Progress Notes (Signed)
Gastroenterology Pre-Procedure Review ? ?Request Date: 02/15/2022 ?Requesting Physician: Dr. Tobi Bastos ? ?PATIENT REVIEW QUESTIONS: The patient responded to the following health history questions as indicated:   ? ?1. Are you having any GI issues? yes (GERD) ?2. Do you have a personal history of Polyps? no ?3. Do you have a family history of Colon Cancer or Polyps? no ?4. Diabetes Mellitus? yes (Type II) ?5. Joint replacements in the past 12 months?no ?6. Major health problems in the past 3 months?no ?7. Any artificial heart valves, MVP, or defibrillator?no ?   ?MEDICATIONS & ALLERGIES:    ?Patient reports the following regarding taking any anticoagulation/antiplatelet therapy:   ?Plavix, Coumadin, Eliquis, Xarelto, Lovenox, Pradaxa, Brilinta, or Effient? no ?Aspirin? no ? ?Patient confirms/reports the following medications:  ?Current Outpatient Medications  ?Medication Sig Dispense Refill  ? atorvastatin (LIPITOR) 40 MG tablet Take 1 tablet (40 mg total) by mouth daily at 6 PM. 90 tablet 2  ? carvedilol (COREG) 6.25 MG tablet Take 1 tablet (6.25 mg total) by mouth 2 (two) times daily. 180 tablet 3  ? DULoxetine (CYMBALTA) 20 MG capsule Take 2 capsules by mouth daily.    ? MOUNJARO 2.5 MG/0.5ML Pen Inject 1.5 mg into the skin once a week.    ? pantoprazole (PROTONIX) 20 MG tablet Take 1 tablet (20 mg total) by mouth daily. 90 tablet 0  ? pregabalin (LYRICA) 50 MG capsule Take 50 mg by mouth in the morning and at bedtime.    ? rOPINIRole (REQUIP) 0.25 MG tablet Take 1 tablet by mouth 3 (three) times daily.    ? sacubitril-valsartan (ENTRESTO) 97-103 MG Take 1 tablet by mouth 2 (two) times daily. 60 tablet 5  ? sildenafil (VIAGRA) 100 MG tablet Take 100 mg by mouth daily as needed.    ? spironolactone (ALDACTONE) 25 MG tablet Take 1 tablet (25 mg total) by mouth daily. 30 tablet 2  ? TRESIBA FLEXTOUCH 200 UNIT/ML FlexTouch Pen 35 units once daily    ? ?No current facility-administered medications for this visit.   ? ? ?Patient confirms/reports the following allergies:  ?Allergies  ?Allergen Reactions  ? Food   ?  Chicken, eggs and peaches  ? ? ?No orders of the defined types were placed in this encounter. ? ? ?AUTHORIZATION INFORMATION ?Primary Insurance: ?1D#: ?Group #: ? ?Secondary Insurance: ?1D#: ?Group #: ? ?SCHEDULE INFORMATION: ?Date: 02/15/2022 ?Time: ?Location: [facility] ? ?

## 2022-01-25 NOTE — Patient Instructions (Signed)
Nice to see you today ?I will be in touch with your lab results ?I will see you in approx 6 months for your physical and labs the same day ?Follow up sooner if needed ?

## 2022-01-25 NOTE — Assessment & Plan Note (Signed)
Currently maintained on [place].  Patient is followed by [facility] endocrinology.  States he sees them every 3 months.  Continue checking sugars at home as directed, take medications as directed, and follow-up as recommended with endocrinology. ?

## 2022-01-25 NOTE — Assessment & Plan Note (Signed)
History of the same.  States he did try over-the-counter Prilosec that did help some with his GERD.  Patient does not drink alcohol did give list of information about food choices to improve GERD symptoms.  We will trial patient on Protonix 20 mg daily for 3 months. ?

## 2022-01-25 NOTE — Assessment & Plan Note (Signed)
Is to be beginnings of a callus to right plantar surface of foot.  States he seen podiatry in the past for surgeries but not for routine foot care.  Did inform patient that if he would like to see podiatry to let me know and I can likely Dremel the callus down and off. ?

## 2022-01-25 NOTE — Assessment & Plan Note (Signed)
Currently maintained on carvedilol, Entresto, and spironolactone.  Does not check blood pressures at home.  Continue medications as directed and follow-up with cardiology as recommended ?

## 2022-02-14 ENCOUNTER — Other Ambulatory Visit: Payer: Self-pay | Admitting: Physical Medicine & Rehabilitation

## 2022-02-14 ENCOUNTER — Encounter: Payer: Self-pay | Admitting: Gastroenterology

## 2022-02-14 ENCOUNTER — Other Ambulatory Visit (HOSPITAL_COMMUNITY): Payer: Self-pay | Admitting: Physical Medicine & Rehabilitation

## 2022-02-14 DIAGNOSIS — M542 Cervicalgia: Secondary | ICD-10-CM

## 2022-02-14 DIAGNOSIS — G8929 Other chronic pain: Secondary | ICD-10-CM

## 2022-02-15 ENCOUNTER — Encounter: Admission: RE | Payer: Self-pay | Source: Home / Self Care

## 2022-02-15 ENCOUNTER — Ambulatory Visit: Admission: RE | Admit: 2022-02-15 | Payer: Medicare Other | Source: Home / Self Care | Admitting: Gastroenterology

## 2022-02-15 SURGERY — COLONOSCOPY WITH PROPOFOL
Anesthesia: General

## 2022-02-23 ENCOUNTER — Other Ambulatory Visit: Payer: Medicare Other

## 2022-03-06 ENCOUNTER — Ambulatory Visit: Payer: Medicare Other

## 2022-03-20 ENCOUNTER — Ambulatory Visit
Admission: RE | Admit: 2022-03-20 | Discharge: 2022-03-20 | Disposition: A | Discharge status: Home / Self Care | Payer: Medicare Other | Source: Ambulatory Visit | Attending: Physical Medicine & Rehabilitation | Admitting: Physical Medicine & Rehabilitation

## 2022-03-20 DIAGNOSIS — M542 Cervicalgia: Secondary | ICD-10-CM | POA: Diagnosis present

## 2022-03-20 DIAGNOSIS — G8929 Other chronic pain: Secondary | ICD-10-CM | POA: Diagnosis present

## 2022-03-20 DIAGNOSIS — M5441 Lumbago with sciatica, right side: Secondary | ICD-10-CM | POA: Diagnosis not present

## 2022-03-20 MED ORDER — GADOBUTROL 1 MMOL/ML IV SOLN
10.0000 mL | Freq: Once | INTRAVENOUS | Status: AC | PRN
  Administered 2022-03-20: 10 mL via INTRAVENOUS

## 2022-03-27 ENCOUNTER — Telehealth: Payer: Self-pay | Admitting: Nurse Practitioner

## 2022-03-27 NOTE — Telephone Encounter (Signed)
LVM for pt to rtn my call to schedule AWV with [facility]. Please schedule AWV if pt calls the office  

## 2022-04-10 ENCOUNTER — Telehealth: Payer: Self-pay | Admitting: Cardiology

## 2022-04-10 MED ORDER — SPIRONOLACTONE 25 MG PO TABS: 25.0000 mg | ORAL_TABLET | Freq: Every day | ORAL | 0 refills | Status: DC

## 2022-04-10 MED ORDER — ENTRESTO 97-103 MG PO TABS: 1.0000 | ORAL_TABLET | Freq: Two times a day (BID) | ORAL | 0 refills | Status: DC

## 2022-04-10 MED ORDER — CARVEDILOL 6.25 MG PO TABS: 6.2500 mg | ORAL_TABLET | Freq: Two times a day (BID) | ORAL | 0 refills | Status: DC

## 2022-04-10 NOTE — Telephone Encounter (Signed)
*  STAT* If patient is at the pharmacy, call can be transferred to refill team.   1. Which medications need to be refilled? (please list name of each medication and dose if known)  carvedilol (COREG) 6.25 MG tablet sacubitril-valsartan (ENTRESTO) 97-103 MG spironolactone (ALDACTONE) 25 MG tablet   2. Which pharmacy/location (including street and city if local pharmacy) is medication to be sent to? [facility] [address], [place] - [address]       3. Do they need a 30 day or 90 day supply? 90 with refills  Patient is scheduled to see Dr. Azucena Cecil 04/20/22 but will not have enough medication to last until his upcoming appt

## 2022-04-20 ENCOUNTER — Encounter: Payer: Self-pay | Admitting: Cardiology

## 2022-04-20 ENCOUNTER — Ambulatory Visit: Payer: Medicare Other | Admitting: Cardiology

## 2022-05-01 ENCOUNTER — Encounter: Payer: Self-pay | Admitting: Cardiology

## 2022-05-07 ENCOUNTER — Encounter: Payer: Self-pay | Admitting: Cardiology

## 2022-05-07 ENCOUNTER — Ambulatory Visit (INDEPENDENT_AMBULATORY_CARE_PROVIDER_SITE_OTHER): Payer: Medicare Other | Admitting: Cardiology

## 2022-05-07 VITALS — BP 130/90 | HR 65 | Ht 73.0 in | Wt 220.4 lb

## 2022-05-07 DIAGNOSIS — I10 Essential (primary) hypertension: Secondary | ICD-10-CM | POA: Diagnosis not present

## 2022-05-07 DIAGNOSIS — I428 Other cardiomyopathies: Secondary | ICD-10-CM | POA: Diagnosis not present

## 2022-05-07 NOTE — Patient Instructions (Signed)
Medication Instructions:   Your physician recommends that you continue on your current medications as directed. Please refer to the Current Medication list given to you today.  *If you need a refill on your cardiac medications before your next appointment, please call your pharmacy*    Testing/Procedures:  Your physician has requested that you have an echocardiogram in 6 months. Echocardiography is a painless test that uses sound waves to create images of your heart. It provides your doctor with information about the size and shape of your heart and how well your heart's chambers and valves are working. This procedure takes approximately one hour. There are no restrictions for this procedure.    Follow-Up: At [facility], you and your health needs are our priority.  As part of our continuing mission to provide you with exceptional heart care, we have created designated Provider Care Teams.  These Care Teams include your primary Cardiologist (physician) and Advanced Practice Providers (APPs -  Physician Assistants and Nurse Practitioners) who all work together to provide you with the care you need, when you need it.  We recommend signing up for the patient portal called "MyChart".  Sign up information is provided on this After Visit Summary.  MyChart is used to connect with patients for Virtual Visits (Telemedicine).  Patients are able to view lab/test results, encounter notes, upcoming appointments, etc.  Non-urgent messages can be sent to your provider as well.   To learn more about what you can do with MyChart, go to [email or web address].    Your next appointment:   1 year(s)  The format for your next appointment:   In Person  Provider:   You may see Debbe Odea, MD or one of the following Advanced Practice Providers on your designated Care Team:   Nicolasa Ducking, NP Eula Listen, PA-C Cadence Fransico Michael, [place]    Other Instructions   Important Information About  Sugar

## 2022-05-07 NOTE — Progress Notes (Signed)
Cardiology Office Note:    Date:  05/07/2022   ID:  [name], DOB 1976-09-24, MRN [id number]  PCP:  Michela Pitcher, NP  Cardiologist:  Kate Sable, MD  Electrophysiologist:  None   Referring MD: Michela Pitcher, NP   Chief Complaint  Patient presents with   Other    6 month f/u no complaints today. Meds reviewed verbally with pt.     History of Present Illness:    [name] is a 46 y.o. male with a hx of NICM (initial EF <20 improved to 40-45%), hypertension, diabetes, hyperlipidemia,  who presents for follow-up.    Patient is being seen for nonischemic cardiomyopathy and medication titration.  Entresto previously increased to 97-103 mg twice daily.  Last echocardiogram showed mildly reduced EF of 45%. Tolerating medications at current doses.  Feels well, denying chest pain or shortness of breath.   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     Past Medical History:  Diagnosis Date   CHF (congestive heart failure) (HCC)    CKD (chronic kidney disease), stage III (HCC)    Diabetes mellitus    ED (erectile dysfunction)    Hypertension     Past Surgical History:  Procedure Laterality Date   ANKLE SURGERY     both    APPENDECTOMY     back fusion     l4,l5   BACK SURGERY     LAPAROSCOPIC APPENDECTOMY N/A 03/28/2019   Procedure: APPENDECTOMY LAPAROSCOPIC;  Surgeon: Benjamine Sprague, DO;  Location: [facility] ORS;  Service: General;  Laterality: N/A;   NASAL SINUS SURGERY     SHOULDER ARTHROSCOPY WITH LABRAL REPAIR Left     Current Medications: Current Meds  Medication Sig   atorvastatin (LIPITOR) 40 MG tablet Take 1 tablet (40 mg total) by mouth daily at 6 PM.   carvedilol (COREG) 6.25 MG tablet Take 1 tablet (6.25 mg total) by mouth 2 (two) times daily.   DULoxetine (CYMBALTA) 20 MG capsule Take 2 capsules  by mouth daily.   MOUNJARO 2.5 MG/0.5ML Pen Inject 1.5 mg into the skin once a week.   pantoprazole (PROTONIX) 20 MG tablet Take 1 tablet (20 mg total) by mouth daily.   pregabalin (LYRICA) 50 MG capsule Take 50 mg by mouth in the morning and at bedtime.   rOPINIRole (REQUIP) 0.25 MG tablet Take 1 tablet by mouth at bedtime.   sacubitril-valsartan (ENTRESTO) 97-103 MG Take 1 tablet by mouth 2 (two) times daily.   sildenafil (VIAGRA) 100 MG tablet Take 100 mg by mouth daily as needed.   spironolactone (ALDACTONE) 25 MG tablet Take 1 tablet (25 mg total) by mouth daily.   TRESIBA FLEXTOUCH 200 UNIT/ML FlexTouch Pen 35 units once daily     Allergies:   Food   Social History   Socioeconomic History   Marital status: Married    Spouse name: Not on file   Number of children: 4   Years of education: Not on file   Highest education level: Not on file  Occupational History   Occupation: unemployed  Tobacco Use   Smoking status: Never   Smokeless tobacco: Never  Vaping Use   Vaping Use: Never used  Substance and Sexual Activity   Alcohol use: No   Drug use: No   Sexual activity: Yes  Birth control/protection: None  Other Topics Concern   Not on file  Social History Narrative   Self employed      Hobbies: Basketball couch    Social Determinants of Health   Financial Resource Strain: Low Risk  (03/24/2020)   Overall Financial Resource Strain (CARDIA)    Difficulty of Paying Living Expenses: Not hard at all  Food Insecurity: No Food Insecurity (03/24/2020)   Hunger Vital Sign    Worried About Running Out of Food in the Last Year: Never true    Ran Out of Food in the Last Year: Never true  Transportation Needs: No Transportation Needs (03/24/2020)   PRAPARE - Administrator, Civil Service (Medical): No    Lack of Transportation (Non-Medical): No  Physical Activity: Sufficiently Active (03/24/2020)   Exercise Vital Sign    Days of Exercise per Week: 3 days    Minutes of  Exercise per Session: 50 min  Stress: No Stress Concern Present (03/24/2020)   [employer] of Occupational Health - Occupational Stress Questionnaire    Feeling of Stress : Not at all  Social Connections: Moderately Integrated (09/24/2019)   Social Connection and Isolation Panel [NHANES]    Frequency of Communication with Friends and Family: More than three times a week    Frequency of Social Gatherings with Friends and Family: More than three times a week    Attends Religious Services: 1 to 4 times per year    Active Member of [employer] or Organizations: No    Attends Banker Meetings: Never    Marital Status: Married     Family History: The patient's family history includes Cancer (age of onset: 10) in his mother; Diabetes in his father; Hypertension in his mother.  ROS:   Please see the history of present illness.     All other systems reviewed and are negative.  EKGs/Labs/Other Studies Reviewed:    The following studies were reviewed today:   EKG:  EKG is ordered today.  EKG shows normal sinus rhythm, normal ECG.  Recent Labs: 01/25/2022: ALT 15; BUN 16; Creatinine, Ser 1.15; Hemoglobin 15.3; Platelets 161.0; Potassium 4.1; Sodium 143  Recent Lipid Panel    Component Value Date/Time   CHOL 118 09/15/2019 0533   TRIG 49 09/15/2019 0533   HDL 48 09/15/2019 0533   CHOLHDL 2.5 09/15/2019 0533   VLDL 10 09/15/2019 0533   LDLCALC 60 09/15/2019 0533    Physical Exam:    VS:  BP 130/90 (BP Location: Left Arm, Patient Position: Sitting, Cuff Size: Normal)   Pulse 65   Ht 6\' 1"  (1.854 m)   Wt 220 lb 6 oz (100 kg)   SpO2 97%   BMI 29.07 kg/m     Wt Readings from Last 3 Encounters:  05/07/22 220 lb 6 oz (100 kg)  01/25/22 226 lb 1 oz (102.5 kg)  10/19/21 230 lb (104.3 kg)     GEN:  Well nourished, well developed in no acute distress HEENT: Normal NECK: No JVD; No carotid bruits CARDIAC: RRR, no murmurs, rubs, gallops RESPIRATORY:  Clear to auscultation  without rales, wheezing or rhonchi  ABDOMEN: Soft, non-tender, non-distended MUSCULOSKELETAL:  No edema; No deformity  SKIN: Warm and dry NEUROLOGIC:  Alert and oriented x 3 PSYCHIATRIC:  Normal affect   ASSESSMENT:   . 1. NICM (nonischemic cardiomyopathy) (HCC)   2. Primary hypertension     PLAN:    NICM likely 2/2 hypertensive heart disease.  Patient describes  NYHA class I-II symptoms.  His original EF was less than 20%.  Last EF 45 to 50%.   Continue Entresto to 97/103 mg twice daily Coreg 6.25 mg twice daily, Aldactone 25 mg daily.  Repeat echo in 6 months. History of hypertension.  BP controlled. Continue Entresto, Coreg, Aldactone.  Follow-up in 12 months.   Medication Adjustments/Labs and Tests Ordered: Current medicines are reviewed at length with the patient today.  Concerns regarding medicines are outlined above.  Orders Placed This Encounter  Procedures   EKG 12-Lead   ECHOCARDIOGRAM COMPLETE     No orders of the defined types were placed in this encounter.     Patient Instructions  Medication Instructions:   Your physician recommends that you continue on your current medications as directed. Please refer to the Current Medication list given to you today.  *If you need a refill on your cardiac medications before your next appointment, please call your pharmacy*    Testing/Procedures:  Your physician has requested that you have an echocardiogram in 6 months. Echocardiography is a painless test that uses sound waves to create images of your heart. It provides your doctor with information about the size and shape of your heart and how well your heart's chambers and valves are working. This procedure takes approximately one hour. There are no restrictions for this procedure.    Follow-Up: At [facility], you and your health needs are our priority.  As part of our continuing mission to provide you with exceptional heart care, we have created designated  Provider Care Teams.  These Care Teams include your primary Cardiologist (physician) and Advanced Practice Providers (APPs -  Physician Assistants and Nurse Practitioners) who all work together to provide you with the care you need, when you need it.  We recommend signing up for the patient portal called "MyChart".  Sign up information is provided on this After Visit Summary.  MyChart is used to connect with patients for Virtual Visits (Telemedicine).  Patients are able to view lab/test results, encounter notes, upcoming appointments, etc.  Non-urgent messages can be sent to your provider as well.   To learn more about what you can do with MyChart, go to [email or web address].    Your next appointment:   1 year(s)  The format for your next appointment:   In Person  Provider:   You may see Debbe Odea, MD or one of the following Advanced Practice Providers on your designated Care Team:   Nicolasa Ducking, NP Eula Listen, PA-C Cadence Fransico Michael, [place]    Other Instructions   Important Information About Sugar         Signed, Debbe Odea, MD  05/07/2022 9:44 AM    [facility]

## 2022-05-11 ENCOUNTER — Telehealth: Payer: Self-pay | Admitting: Nurse Practitioner

## 2022-05-23 ENCOUNTER — Other Ambulatory Visit: Payer: Self-pay

## 2022-05-23 MED ORDER — ENTRESTO 97-103 MG PO TABS: 1.0000 | ORAL_TABLET | Freq: Two times a day (BID) | ORAL | 5 refills | Status: DC

## 2022-05-23 NOTE — Progress Notes (Signed)
Refill requested through MyChart

## 2022-09-10 ENCOUNTER — Telehealth: Payer: Self-pay | Admitting: Nurse Practitioner

## 2022-09-10 NOTE — Telephone Encounter (Signed)
Left message for patient to call back and schedule Medicare Annual Wellness Visit (AWV) either virtually or phone   . Left  my jabber number [phone]   Last AWV  03/24/20     45 min for awv-i and in office appointments 30 min for awv-s  phone/virtual appointments

## 2022-10-31 ENCOUNTER — Telehealth: Payer: Self-pay | Admitting: Nurse Practitioner

## 2022-10-31 NOTE — Telephone Encounter (Signed)
[facility] called in to follow up on a form sent over for this patient. Informed them the provider hasn't had a chance to sign it. Form was faxed over and placed in [address]. Thank you!

## 2022-10-31 NOTE — Telephone Encounter (Signed)
If someone would print it off the [address] and place in my box to review. I just checked and do not have this form

## 2022-11-01 NOTE — Telephone Encounter (Signed)
[facility] called and stated what's the status of the fax they sent for the diabetic montior for [employer]. Call back number [phone].

## 2022-11-01 NOTE — Telephone Encounter (Signed)
After reviewing the document that was received via fax, it appeared to be a scam to me. I called the number listed on the fax to verify this was a legit request and the person I spoke with had a foreign accent and could not tell me a local [facility] he was at. I called the patient to verify he had not submitted a request through [facility] for a freestlye libre. He stated he was called a few weeks ago from an unsolicited number and they asked if he would like a "complimentary freestyle libre" sent to him. He was skeptical about this as well. We both agreed we suspected this to be a scam.

## 2022-11-06 ENCOUNTER — Ambulatory Visit: Payer: Medicare Other | Attending: Cardiology

## 2022-11-06 DIAGNOSIS — I428 Other cardiomyopathies: Secondary | ICD-10-CM | POA: Insufficient documentation

## 2022-11-06 LAB — ECHOCARDIOGRAM COMPLETE
AR max vel: 2.33 cm2
AV Area VTI: 2.28 cm2
AV Area mean vel: 2.18 cm2
AV Mean grad: 3 mmHg
AV Peak grad: 5.4 mmHg
Ao pk vel: 1.16 m/s
Area-P 1/2: 2.63 cm2
Calc EF: 41.9 %
S' Lateral: 4.3 cm
Single Plane A2C EF: 42.8 %
Single Plane A4C EF: 42 %

## 2022-11-28 ENCOUNTER — Telehealth: Payer: Self-pay | Admitting: Nurse Practitioner

## 2022-11-28 NOTE — Telephone Encounter (Signed)
LVM for pt to rtn my call to schedule AWV with [facility] call back # [phone] 

## 2022-12-27 ENCOUNTER — Other Ambulatory Visit: Payer: Self-pay | Admitting: Nurse Practitioner

## 2023-02-07 ENCOUNTER — Telehealth: Payer: Self-pay | Admitting: Cardiology

## 2023-02-07 MED ORDER — ENTRESTO 97-103 MG PO TABS: 1.0000 | ORAL_TABLET | Freq: Two times a day (BID) | ORAL | 0 refills | Status: DC

## 2023-02-07 NOTE — Telephone Encounter (Signed)
Requested Prescriptions   Signed Prescriptions Disp Refills   sacubitril-valsartan (ENTRESTO) 97-103 MG 180 tablet 0    Sig: Take 1 tablet by mouth 2 (two) times daily. PLEASE SCHEDULE OFFICE VISIT FOR FURTHER REFILLS. THANK YOU!    Authorizing Provider: Kate Sable    Ordering User: Raelene Bott, Cavion Faiola L

## 2023-02-07 NOTE — Telephone Encounter (Signed)
*  STAT* If patient is at the pharmacy, call can be transferred to refill team.   1. Which medications need to be refilled? (please list name of each medication and dose if known)   sacubitril-valsartan (ENTRESTO) 97-103 MG   2. Which pharmacy/location (including street and city if local pharmacy) is medication to be sent to?  [place], [place]   3. Do they need a 30 day or 90 day supply?   90 day  Patient stated he has 2 tablets left.

## 2023-04-01 ENCOUNTER — Telehealth: Payer: Self-pay | Admitting: Nurse Practitioner

## 2023-04-01 NOTE — Telephone Encounter (Signed)
Patient scheduled for a cpe and labs

## 2023-04-01 NOTE — Telephone Encounter (Signed)
Got an order for a CGM but patient is over due for an office visit

## 2023-04-03 LAB — HEMOGLOBIN A1C: Hemoglobin A1C: 6.5

## 2023-05-19 ENCOUNTER — Other Ambulatory Visit: Payer: Self-pay | Admitting: Nurse Practitioner

## 2023-05-20 ENCOUNTER — Encounter: Payer: Medicare Other | Admitting: Nurse Practitioner

## 2023-05-20 NOTE — Telephone Encounter (Signed)
Patient contacted the office regarding this medication and the denial, was wondering if this could be filled by [facility]. Stated that Leone Payor was the original prescriber for this medication, but he is currently out and has been for a few days.

## 2023-05-21 NOTE — Progress Notes (Signed)
Error--rescheduled

## 2023-05-22 ENCOUNTER — Other Ambulatory Visit: Payer: Self-pay | Admitting: Nurse Practitioner

## 2023-05-22 MED ORDER — PREGABALIN 50 MG PO CAPS: 50.0000 mg | ORAL_CAPSULE | Freq: Two times a day (BID) | ORAL | 0 refills | Status: AC

## 2023-05-22 NOTE — Telephone Encounter (Signed)
I gave him a month as I have not seen him in over a year. The reason I had denied it also was because he is being followed by neurology and they were prescribing it

## 2023-05-22 NOTE — Telephone Encounter (Signed)
Called patient and reviewed information with him about his Rx request. Patient verbalized understanding and all (if any) questions were answered.  Dorris Fetch, CMA 05/22/2023 3:24 PM

## 2023-06-17 ENCOUNTER — Other Ambulatory Visit: Payer: Self-pay | Admitting: Nurse Practitioner

## 2023-06-20 ENCOUNTER — Encounter: Payer: Self-pay | Admitting: Nurse Practitioner

## 2023-06-20 ENCOUNTER — Ambulatory Visit (INDEPENDENT_AMBULATORY_CARE_PROVIDER_SITE_OTHER): Payer: Medicare Other | Admitting: Nurse Practitioner

## 2023-06-20 VITALS — BP 110/80 | HR 83 | Temp 97.6°F | Ht 73.0 in | Wt 220.0 lb

## 2023-06-20 DIAGNOSIS — I110 Hypertensive heart disease with heart failure: Secondary | ICD-10-CM

## 2023-06-20 DIAGNOSIS — K219 Gastro-esophageal reflux disease without esophagitis: Secondary | ICD-10-CM | POA: Diagnosis not present

## 2023-06-20 DIAGNOSIS — E1142 Type 2 diabetes mellitus with diabetic polyneuropathy: Secondary | ICD-10-CM

## 2023-06-20 DIAGNOSIS — I10 Essential (primary) hypertension: Secondary | ICD-10-CM | POA: Diagnosis not present

## 2023-06-20 DIAGNOSIS — H6122 Impacted cerumen, left ear: Secondary | ICD-10-CM

## 2023-06-20 DIAGNOSIS — Z1159 Encounter for screening for other viral diseases: Secondary | ICD-10-CM

## 2023-06-20 DIAGNOSIS — Z1211 Encounter for screening for malignant neoplasm of colon: Secondary | ICD-10-CM

## 2023-06-20 LAB — CBC
HCT: 45.6 % (ref 39.0–52.0)
Hemoglobin: 14.9 g/dL (ref 13.0–17.0)
MCHC: 32.6 g/dL (ref 30.0–36.0)
MCV: 86.6 fl (ref 78.0–100.0)
Platelets: 155 10*3/uL (ref 150.0–400.0)
RBC: 5.26 Mil/uL (ref 4.22–5.81)
RDW: 14.3 % (ref 11.5–15.5)
WBC: 3.6 10*3/uL — ABNORMAL LOW (ref 4.0–10.5)

## 2023-06-20 LAB — LIPID PANEL
Cholesterol: 116 mg/dL (ref 0–200)
HDL: 47.8 mg/dL (ref >39.00)
LDL Cholesterol: 48 mg/dL (ref 0–99)
NonHDL: 68.57
Total CHOL/HDL Ratio: 2
Triglycerides: 105 mg/dL (ref 0.0–149.0)
VLDL: 21 mg/dL (ref 0.0–40.0)

## 2023-06-20 LAB — COMPREHENSIVE METABOLIC PANEL
ALT: 19 U/L (ref 0–53)
AST: 15 U/L (ref 0–37)
Albumin: 4.5 g/dL (ref 3.5–5.2)
Alkaline Phosphatase: 82 U/L (ref 39–117)
BUN: 19 mg/dL (ref 6–23)
CO2: 30 mEq/L (ref 19–32)
Calcium: 9.4 mg/dL (ref 8.4–10.5)
Chloride: 104 mEq/L (ref 96–112)
Creatinine, Ser: 1.31 mg/dL (ref 0.40–1.50)
GFR: 64.71 mL/min (ref >60.00)
Glucose, Bld: 144 mg/dL — ABNORMAL HIGH (ref 70–99)
Potassium: 4.1 mEq/L (ref 3.5–5.1)
Sodium: 140 mEq/L (ref 135–145)
Total Bilirubin: 0.8 mg/dL (ref 0.2–1.2)
Total Protein: 7 g/dL (ref 6.0–8.3)

## 2023-06-20 LAB — MICROALBUMIN / CREATININE URINE RATIO
Creatinine,U: 82.1 mg/dL
Microalb Creat Ratio: 0.9 mg/g (ref 0.0–30.0)
Microalb, Ur: <0.7 mg/dL (ref 0.0–1.9)

## 2023-06-20 LAB — TSH: TSH: 0.98 u[IU]/mL (ref 0.35–5.50)

## 2023-06-20 NOTE — Patient Instructions (Signed)
Nice to see you today I will be in touch with the labs once I have them Follow up with me in 1 year, sooner if you need me  Make an appointment with Dr Karleen Hampshire Copland about your knee

## 2023-06-20 NOTE — Assessment & Plan Note (Signed)
Patient is followed by endocrinology and neurology.  Patient currently maintained on pregabalin 50 mg twice daily.  Follow-up with neurology for further refills

## 2023-06-20 NOTE — Assessment & Plan Note (Signed)
Patient is followed by cardiology.  He is on Entresto, spironolactone, carvedilol.  Keep taking medication as prescribed follow-up with cardiology as recommended

## 2023-06-20 NOTE — Assessment & Plan Note (Signed)
Patient currently maintained on lifestyle modifications only

## 2023-06-20 NOTE — Assessment & Plan Note (Signed)
Patient currently on spironolactone, carvedilol, Entresto.  Blood pressure within normal limits continue medication as prescribed follow-up with cardiology as recommended

## 2023-06-20 NOTE — Assessment & Plan Note (Signed)
Verbal consent obtained.  Patient was prepped per office policy with cerumen softening eardrops.  A mixture of water hydroperoxide was used.  It was irrigated patient did have side effect of dizziness but tolerated procedure decently well.  Impaction was removed

## 2023-06-20 NOTE — Progress Notes (Signed)
Established Patient Office Visit  Subjective   Patient ID: [name], male    DOB: 1976/10/28  Age: 47 y.o. MRN: [id number]  Chief Complaint  Patient presents with   Annual Exam    Not fasting    Back Pain    Increased symptoms        CHF/HTN: Patient currently maintained on carvedilol, Entresto, spironolactone.  Patient is followed by cardiology Debbe Odea, MD  DM2: Patient currently maintained on SGLT2 inhibitor, Mounjaro, Tresiba.  Patient is followed by endocrinology through [facility].  Back pain: patient was followed by physiatry Filomena Jungling, MD. Patient had MR cervical and lumbar spine done 03/20/2022. States he was told he had a herniated disc. States that surgery was the optoin. States that he does not want to do it again. States that he has done PT 2 year ago and it did not help. Pivot. Intervnetional pain medicine was done approx 2 years ago.   Immunizations: -Tetanus: Completed in 2015 -Influenza: Out of season -Shingles: Too young -Pneumonia: Up-to-date -COVID: Original 2 vaccines  Diet: Fair diet. States that he does eat often. States 6 small meals. States that he will eat and then have snacks. 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 States that he sleeps throughout the day because of the medication  Does not snore regularly  States that he did a sleep study last week.  At home      Review of Systems  Constitutional:  Negative for chills and fever.  Respiratory:  Positive for shortness of breath (at rest).   Cardiovascular:  Negative for chest pain and leg swelling.  Gastrointestinal:  Negative for abdominal pain, blood in stool, constipation, diarrhea, nausea  and vomiting.       Bm daily   Genitourinary:  Negative for dysuria and hematuria.  Musculoskeletal:  Positive for back pain and joint pain.  Neurological:  Positive for tingling. Negative for headaches.  Psychiatric/Behavioral:  Negative for hallucinations and suicidal ideas.       Objective:     BP 110/80   Pulse 83   Temp 97.6 F (36.4 C) (Temporal)   Ht 6\' 1"  (1.854 m)   Wt 220 lb (99.8 kg)   SpO2 97%   BMI 29.03 kg/m  BP Readings from Last 3 Encounters:  06/20/23 110/80  05/07/22 130/90  01/25/22 (!) 134/96   Wt Readings from Last 3 Encounters:  06/20/23 220 lb (99.8 kg)  05/07/22 220 lb 6 oz (100 kg)  01/25/22 226 lb 1 oz (102.5 kg)      Physical Exam Vitals and nursing note reviewed. Exam conducted with a chaperone present (joellen thomson, CMA).  Constitutional:      Appearance: Normal appearance.  HENT:     Right Ear: Tympanic membrane, ear canal and external ear normal.     Left Ear: Ear canal and external ear normal. There is impacted cerumen.     Mouth/Throat:     Mouth: Mucous membranes are moist.     Pharynx: Oropharynx is clear.  Eyes:     Extraocular Movements: Extraocular movements intact.     Pupils: Pupils are equal, round, and reactive to light.  Cardiovascular:  Rate and Rhythm: Normal rate and regular rhythm.     Pulses: Normal pulses.     Heart sounds: Normal heart sounds.  Pulmonary:     Effort: Pulmonary effort is normal.     Breath sounds: Normal breath sounds.  Abdominal:     General: Bowel sounds are normal. There is no distension.     Palpations: There is no mass.     Tenderness: There is no abdominal tenderness.     Hernia: No hernia is present.  Musculoskeletal:     Right lower leg: No edema.     Left lower leg: No edema.  Lymphadenopathy:     Cervical: No cervical adenopathy.  Skin:    General: Skin is warm.  Neurological:     General: No focal deficit present.     Mental Status: He is alert.     Deep Tendon  Reflexes:     Reflex Scores:      Bicep reflexes are 2+ on the right side and 2+ on the left side.      Patellar reflexes are 2+ on the right side and 2+ on the left side.    Comments: Bilateral upper and lower extremity strength 5/5  Psychiatric:        Mood and Affect: Mood normal.        Behavior: Behavior normal.        Thought Content: Thought content normal.        Judgment: Judgment normal.     Diabetic Foot Form - Detailed   Diabetic Foot Exam - detailed Diabetic Foot exam was performed with the following findings: Yes 06/20/2023  9:15 AM  Visual Foot Exam completed.: Yes  Is there swelling or and abnormal foot shape?: No Is there a claw toe deformity?: No Is there elevated skin temparature?: No Pulse Foot Exam completed.: Yes   Right posterior Tibialias: Present Left posterior Tibialias: Present   Right Dorsalis Pedis: Present Left Dorsalis Pedis: Present  Sensory Foot Exam Completed.: Yes Semmes-Weinstein Monofilament Test   Comments: All 10 sites bilaterally sensation intact      No results found for any visits on 06/20/23.    The ASCVD Risk score (Arnett DK, et al., 2019) failed to calculate for the following reasons:   Cannot find a previous HDL lab   Cannot find a previous total cholesterol lab    Assessment & Plan:   Problem List Items Addressed This Visit       Cardiovascular and Mediastinum   Essential hypertension - Primary    Patient currently on spironolactone, carvedilol, Entresto.  Blood pressure within normal limits continue medication as prescribed follow-up with cardiology as recommended      Relevant Orders   CBC   Comprehensive metabolic panel   TSH   Lipid panel   Hypertensive heart disease with heart failure [facility])    Patient is followed by cardiology.  He is on Entresto, spironolactone, carvedilol.  Keep taking medication as prescribed follow-up with cardiology as recommended        Digestive   Gastroesophageal reflux disease     Patient currently maintained on lifestyle modifications only        Endocrine   Diabetic polyneuropathy associated with type 2 diabetes mellitus [facility])    Patient is followed by endocrinology and neurology.  Patient currently maintained on pregabalin 50 mg twice daily.  Follow-up with neurology for further refills      Relevant Orders   Microalbumin / creatinine urine ratio  Lipid panel     Nervous and Auditory   Impacted cerumen of left ear    Verbal consent obtained.  Patient was prepped per office policy with cerumen softening eardrops.  A mixture of water hydroperoxide was used.  It was irrigated patient did have side effect of dizziness but tolerated procedure decently well.  Impaction was removed      Relevant Orders   Ear Lavage   Other Visit Diagnoses     Screening for colon cancer       Encounter for hepatitis C screening test for low risk patient       Relevant Orders   Hepatitis C Antibody       Return in about 1 year (around 06/19/2024) for CPE and Labs.    Audria Nine, NP

## 2023-06-24 ENCOUNTER — Telehealth: Payer: Self-pay | Admitting: Cardiology

## 2023-06-24 NOTE — Telephone Encounter (Signed)
*  STAT* If patient is at the pharmacy, call can be transferred to refill team.   1. Which medications need to be refilled? (please list name of each medication and dose if known) atorvastatin (LIPITOR) 40 MG tablet   2. Which pharmacy/location (including street and city if local pharmacy) is medication to be sent to?  [facility] [address] [place] - [address]    3. Do they need a 30 day or 90 day supply? 90

## 2023-06-24 NOTE — Telephone Encounter (Signed)
Please contact pt for future appointment. Pt overdue for f/u. 

## 2023-06-27 ENCOUNTER — Other Ambulatory Visit: Payer: Self-pay | Admitting: Nurse Practitioner

## 2023-06-30 ENCOUNTER — Other Ambulatory Visit: Payer: Self-pay | Admitting: Nurse Practitioner

## 2023-07-01 ENCOUNTER — Other Ambulatory Visit: Payer: Self-pay | Admitting: Cardiology

## 2023-07-01 NOTE — Telephone Encounter (Signed)
Patient needs a follow up appt

## 2023-07-02 ENCOUNTER — Other Ambulatory Visit: Payer: Self-pay | Admitting: *Deleted

## 2023-07-02 ENCOUNTER — Other Ambulatory Visit: Payer: Self-pay | Admitting: Nurse Practitioner

## 2023-07-03 MED ORDER — ENTRESTO 97-103 MG PO TABS: 1.0000 | ORAL_TABLET | Freq: Two times a day (BID) | ORAL | 0 refills | Status: DC

## 2023-07-03 NOTE — Telephone Encounter (Signed)
Scheduled 10/07

## 2023-07-04 MED ORDER — SPIRONOLACTONE 25 MG PO TABS: 25.0000 mg | ORAL_TABLET | Freq: Every day | ORAL | 1 refills | Status: DC

## 2023-08-26 ENCOUNTER — Other Ambulatory Visit: Payer: Self-pay | Admitting: Nurse Practitioner

## 2023-08-26 ENCOUNTER — Encounter: Payer: Self-pay | Admitting: Cardiology

## 2023-08-26 ENCOUNTER — Ambulatory Visit: Payer: Medicare Other | Attending: Cardiology | Admitting: Cardiology

## 2023-08-26 VITALS — BP 150/114 | HR 68 | Ht 73.0 in | Wt 221.6 lb

## 2023-08-26 DIAGNOSIS — I10 Essential (primary) hypertension: Secondary | ICD-10-CM | POA: Diagnosis present

## 2023-08-26 DIAGNOSIS — I428 Other cardiomyopathies: Secondary | ICD-10-CM | POA: Insufficient documentation

## 2023-08-26 MED ORDER — CARVEDILOL 6.25 MG PO TABS
6.2500 mg | ORAL_TABLET | Freq: Two times a day (BID) | ORAL | 3 refills | Status: DC
Start: 2023-08-26 — End: 2024-08-18

## 2023-08-26 MED ORDER — ENTRESTO 97-103 MG PO TABS
1.0000 | ORAL_TABLET | Freq: Two times a day (BID) | ORAL | 3 refills | Status: DC
Start: 2023-08-26 — End: 2024-03-12

## 2023-08-26 MED ORDER — SPIRONOLACTONE 25 MG PO TABS
25.0000 mg | ORAL_TABLET | Freq: Every day | ORAL | 3 refills | Status: DC
Start: 2023-08-26 — End: 2024-03-12

## 2023-08-26 NOTE — Patient Instructions (Signed)
Medication Instructions:   RESTART Carvedilol - Take one tablet ( 6.25mg ) by mouth twice a day.   *If you need a refill on your cardiac medications before your next appointment, please call your pharmacy*   Lab Work:  None Ordered  If you have labs (blood work) drawn today and your tests are completely normal, you will receive your results only by: MyChart Message (if you have MyChart) OR A paper copy in the mail If you have any lab test that is abnormal or we need to change your treatment, we will call you to review the results.   Testing/Procedures:  None Ordered   Follow-Up: At [facility], you and your health needs are our priority.  As part of our continuing mission to provide you with exceptional heart care, we have created designated Provider Care Teams.  These Care Teams include your primary Cardiologist (physician) and Advanced Practice Providers (APPs -  Physician Assistants and Nurse Practitioners) who all work together to provide you with the care you need, when you need it.  We recommend signing up for the patient portal called "MyChart".  Sign up information is provided on this After Visit Summary.  MyChart is used to connect with patients for Virtual Visits (Telemedicine).  Patients are able to view lab/test results, encounter notes, upcoming appointments, etc.  Non-urgent messages can be sent to your provider as well.   To learn more about what you can do with MyChart, go to [email or web address].    Your next appointment:   6 month(s)  Provider:   You may see Debbe Odea, MD or one of the following Advanced Practice Providers on your designated Care Team:   Nicolasa Ducking, NP Eula Listen, PA-C Cadence Fransico Michael, PA-C Charlsie Quest, NP

## 2023-08-26 NOTE — Progress Notes (Signed)
Cardiology Office Note:    Date:  08/26/2023   ID:  [name], DOB 1976/03/19, MRN [id number]  PCP:  Eden Emms, NP  Cardiologist:  Debbe Odea, MD  Electrophysiologist:  None   Referring MD: Eden Emms, NP   Chief Complaint  Patient presents with   Follow-up    Patient reports more frequent feeling of chest "heaviness" with no pain for past 2 weeks.  States he is not taking Carvedilol despite being on active med list.   Blood pressure elevated on office check today.     History of Present Illness:    [name] is a 47 y.o. male with a hx of NICM (initial EF <20 improved to 40-45%), hypertension, diabetes, hyperlipidemia,  who presents for follow-up.    States doing okay, states not taking carvedilol.  No refills available, patient thought he was told to stop taking this medication.  Compliant with Eliquis and spironolactone as prescribed.  Denies edema.  States having diarrhea over the past 2 days or so.  Denies fevers.  Otherwise doing okay.  Has noticed some chest pressure occasionally.  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     Past Medical History:  Diagnosis Date   CHF (congestive heart failure) (HCC)    CKD (chronic kidney disease), stage III (HCC)    Diabetes mellitus    ED (erectile dysfunction)    Hypertension     Past Surgical History:  Procedure Laterality Date   ANKLE SURGERY     both    APPENDECTOMY     back fusion     l4,l5   BACK SURGERY     LAPAROSCOPIC APPENDECTOMY N/A 03/28/2019   Procedure: APPENDECTOMY LAPAROSCOPIC;  Surgeon: Sung Amabile, DO;  Location: [facility] ORS;  Service: General;  Laterality: N/A;   NASAL SINUS SURGERY     SHOULDER ARTHROSCOPY WITH LABRAL REPAIR Left     Current Medications: Current Meds  Medication Sig   atorvastatin  (LIPITOR) 40 MG tablet Take 1 tablet (40 mg total) by mouth daily at 6 PM.   DULoxetine (CYMBALTA) 20 MG capsule Take 2 capsules by mouth daily.   FARXIGA 10 MG TABS tablet Take 10 mg by mouth daily.   MOUNJARO 2.5 MG/0.5ML Pen Inject 1.5 mg into the skin once a week.   pregabalin (LYRICA) 50 MG capsule Take 1 capsule (50 mg total) by mouth in the morning and at bedtime.   sildenafil (VIAGRA) 100 MG tablet Take 100 mg by mouth daily as needed.   TRESIBA FLEXTOUCH 200 UNIT/ML FlexTouch Pen 35 units once daily   [DISCONTINUED] sacubitril-valsartan (ENTRESTO) 97-103 MG Take 1 tablet by mouth 2 (two) times daily.   [DISCONTINUED] spironolactone (ALDACTONE) 25 MG tablet Take 1 tablet (25 mg total) by mouth daily.     Allergies:   Food   Social History   Socioeconomic History   Marital status: Married    Spouse name: [name]   Number of children: 4   Years of education: Not on file   Highest education level: Not on file  Occupational History   Occupation: unemployed  Tobacco Use   Smoking status: Never   Smokeless tobacco: Never  Vaping Use   Vaping status: Never Used  Substance and Sexual Activity   Alcohol use:  No   Drug use: No   Sexual activity: Yes    Birth control/protection: None  Other Topics Concern   Not on file  Social History Narrative   Self employed      Hobbies: Basketball couch    Social Determinants of Health   Financial Resource Strain: Low Risk  (03/24/2020)   Overall Financial Resource Strain (CARDIA)    Difficulty of Paying Living Expenses: Not hard at all  Food Insecurity: No Food Insecurity (03/24/2020)   Hunger Vital Sign    Worried About Running Out of Food in the Last Year: Never true    Ran Out of Food in the Last Year: Never true  Transportation Needs: No Transportation Needs (03/24/2020)   PRAPARE - Administrator, Civil Service (Medical): No    Lack of Transportation (Non-Medical): No  Physical Activity: Sufficiently Active (03/24/2020)    Exercise Vital Sign    Days of Exercise per Week: 3 days    Minutes of Exercise per Session: 50 min  Stress: No Stress Concern Present (03/24/2020)   [employer] of Occupational Health - Occupational Stress Questionnaire    Feeling of Stress : Not at all  Social Connections: Moderately Integrated (09/24/2019)   Social Connection and Isolation Panel [NHANES]    Frequency of Communication with Friends and Family: More than three times a week    Frequency of Social Gatherings with Friends and Family: More than three times a week    Attends Religious Services: 1 to 4 times per year    Active Member of [employer] or Organizations: No    Attends Banker Meetings: Never    Marital Status: Married     Family History: The patient's family history includes Cancer (age of onset: 46) in his mother; Diabetes in his father; Hypertension in his mother.  ROS:   Please see the history of present illness.     All other systems reviewed and are negative.  EKGs/Labs/Other Studies Reviewed:    The following studies were reviewed today:   EKG Interpretation Date/Time:  Monday August 26 2023 14:31:46 EDT Ventricular Rate:  66 PR Interval:  166 QRS Duration:  98 QT Interval:  398 QTC Calculation: 417 R Axis:   55  Text Interpretation: Normal sinus rhythm Possible Left atrial enlargement Confirmed by Debbe Odea ([id number]) on 08/26/2023 2:40:19 PM    Recent Labs: 06/20/2023: ALT 19; BUN 19; Creatinine, Ser 1.31; Hemoglobin 14.9; Platelets 155.0; Potassium 4.1; Sodium 140; TSH 0.98  Recent Lipid Panel    Component Value Date/Time   CHOL 116 06/20/2023 0953   TRIG 105.0 06/20/2023 0953   HDL 47.80 06/20/2023 0953   CHOLHDL 2 06/20/2023 0953   VLDL 21.0 06/20/2023 0953   LDLCALC 48 06/20/2023 0953    Physical Exam:    VS:  BP (!) 150/114 (BP Location: Left Arm, Patient Position: Sitting, Cuff Size: Large)   Pulse 68   Ht 6\' 1"  (1.854 m)   Wt 221 lb 9.6 oz (100.5 kg)    SpO2 94%   BMI 29.24 kg/m     Wt Readings from Last 3 Encounters:  08/26/23 221 lb 9.6 oz (100.5 kg)  06/20/23 220 lb (99.8 kg)  05/07/22 220 lb 6 oz (100 kg)     GEN:  Well nourished, well developed in no acute distress HEENT: Normal NECK: No JVD; No carotid bruits CARDIAC: RRR, no murmurs, rubs, gallops RESPIRATORY:  Clear to auscultation without rales, wheezing or rhonchi  ABDOMEN:  Soft, non-tender, non-distended MUSCULOSKELETAL:  No edema; No deformity  SKIN: Warm and dry NEUROLOGIC:  Alert and oriented x 3 PSYCHIATRIC:  Normal affect   ASSESSMENT:   . 1. NICM (nonischemic cardiomyopathy) (HCC)   2. Primary hypertension     PLAN:    NICM initial EF 20%, last EF 45%.  Etiology likely 2/2 hypertensive heart disease.  Represcribe Coreg 6.25 mg twice daily.  Continue Entresto to 97/103 mg twice daily, Aldactone 25 mg daily.   Hypertension, BP elevated.  Restart Coreg 6.25 mg twice daily, continue Entresto, Aldactone.  Follow-up in 6 months.   Medication Adjustments/Labs and Tests Ordered: Current medicines are reviewed at length with the patient today.  Concerns regarding medicines are outlined above.  Orders Placed This Encounter  Procedures   EKG 12-Lead   EKG 12-Lead     Meds ordered this encounter  Medications   sacubitril-valsartan (ENTRESTO) 97-103 MG    Sig: Take 1 tablet by mouth 2 (two) times daily.    Dispense:  60 tablet    Refill:  3   spironolactone (ALDACTONE) 25 MG tablet    Sig: Take 1 tablet (25 mg total) by mouth daily.    Dispense:  30 tablet    Refill:  3   carvedilol (COREG) 6.25 MG tablet    Sig: Take 1 tablet (6.25 mg total) by mouth 2 (two) times daily.    Dispense:  60 tablet    Refill:  3      Patient Instructions  Medication Instructions:   RESTART Carvedilol - Take one tablet ( 6.25mg ) by mouth twice a day.   *If you need a refill on your cardiac medications before your next appointment, please call your pharmacy*   Lab  Work:  None Ordered  If you have labs (blood work) drawn today and your tests are completely normal, you will receive your results only by: MyChart Message (if you have MyChart) OR A paper copy in the mail If you have any lab test that is abnormal or we need to change your treatment, we will call you to review the results.   Testing/Procedures:  None Ordered   Follow-Up: At [facility], you and your health needs are our priority.  As part of our continuing mission to provide you with exceptional heart care, we have created designated Provider Care Teams.  These Care Teams include your primary Cardiologist (physician) and Advanced Practice Providers (APPs -  Physician Assistants and Nurse Practitioners) who all work together to provide you with the care you need, when you need it.  We recommend signing up for the patient portal called "MyChart".  Sign up information is provided on this After Visit Summary.  MyChart is used to connect with patients for Virtual Visits (Telemedicine).  Patients are able to view lab/test results, encounter notes, upcoming appointments, etc.  Non-urgent messages can be sent to your provider as well.   To learn more about what you can do with MyChart, go to [email or web address].    Your next appointment:   6 month(s)  Provider:   You may see Debbe Odea, MD or one of the following Advanced Practice Providers on your designated Care Team:   Nicolasa Ducking, NP Eula Listen, PA-C Cadence Fransico Michael, PA-C Charlsie Quest, NP   Signed, Debbe Odea, MD  08/26/2023 3:28 PM    [facility]

## 2023-08-26 NOTE — Telephone Encounter (Signed)
Looks like patient should have been out in August. Called patient states that he has been taking and has not missed any doses. He has received from [facility] endo. Should he get from them now or our office?

## 2023-08-28 NOTE — Telephone Encounter (Signed)
I have asked him to get it from [facility] neurology

## 2023-10-04 ENCOUNTER — Ambulatory Visit (INDEPENDENT_AMBULATORY_CARE_PROVIDER_SITE_OTHER): Payer: Medicare Other | Admitting: Nurse Practitioner

## 2023-10-04 VITALS — BP 140/100 | HR 75 | Temp 98.1°F | Ht 73.0 in | Wt 223.8 lb

## 2023-10-04 DIAGNOSIS — Z719 Counseling, unspecified: Secondary | ICD-10-CM | POA: Insufficient documentation

## 2023-10-04 NOTE — Patient Instructions (Signed)
Nice to see you today.  Your exam was benign. Recommend your partner to be evaluated by other medical professional. Follow-up with me in August 2025 for your physical sooner if you need me

## 2023-10-04 NOTE — Progress Notes (Signed)
   Acute Office Visit  Subjective:     Patient ID: [name], male    DOB: 1976-10-08, 47 y.o.   MRN: [id number]  Chief Complaint  Patient presents with   itching and burning    Pt complains of possible infection in mouth. States that his wife has had itching and burning when performing oral. Pt is not sure if the cause is due to a past tooth infection (missing a tooth on bottom row)     HPI Patient is in today for concerns in regards to oral sex with a history of DM2, CHF, HTN, CKD  States that he was preforming oral sex on his wife. States that his wife is experiencing an irration after he is preforming it. States that she having it for a couple days after. States that he did shave his beard and the problem persisted.  Patient states last time he has done oral sex was approximately weeks ago.  He states he done it vaginally and annually and the irritation happens no matter the location.  Review of Systems  Constitutional:  Negative for chills and fever.  HENT:  Negative for sore throat.   Respiratory:  Negative for shortness of breath.   Cardiovascular:  Negative for chest pain.  Skin:  Negative for itching and rash.        Objective:    BP (!) 140/100   Pulse 75   Temp 98.1 F (36.7 C) (Oral)   Ht 6\' 1"  (1.854 m)   Wt 223 lb 12.8 oz (101.5 kg)   SpO2 95%   BMI 29.53 kg/m    Physical Exam Vitals and nursing note reviewed.  Constitutional:      Appearance: Normal appearance.  HENT:     Mouth/Throat:     Mouth: Mucous membranes are moist.     Pharynx: Oropharynx is clear. No posterior oropharyngeal erythema.  Lymphadenopathy:     Cervical: No cervical adenopathy.  Neurological:     Mental Status: He is alert.     No results found for any visits on 10/04/23.      Assessment & Plan:   Problem List Items Addressed This Visit       Other   Consultation without specific complaint - Primary    Patient is experiencing skin irritation post oral sex  vaginally or annually.  Patient exam benign patient not having any signs or symptoms.  Did encourage patient to possibly trim facial hair as this could be the culprit.  Did encourage patient to have spouse to be evaluated by the medical provider       No orders of the defined types were placed in this encounter.   Return in about 9 months (around 06/20/2024) for CPE and Labs.  Audria Nine, NP

## 2023-10-04 NOTE — Assessment & Plan Note (Signed)
Patient is experiencing skin irritation post oral sex vaginally or annually.  Patient exam benign patient not having any signs or symptoms.  Did encourage patient to possibly trim facial hair as this could be the culprit.  Did encourage patient to have spouse to be evaluated by the medical provider

## 2023-11-26 NOTE — Progress Notes (Signed)
 Referring Physician:  No referring provider defined for this encounter.  Primary Physician:  Wendee Lynwood HERO, NP  History of Present Illness: 11/27/2023 [name] has a history of HTN, CHF, dilated cardiomyopathy, GERD, DM with polyneuropathy.   History of chronic pain x 20+ years, had surgery in 2006 and 2008 with improvement. Pain has been worse in last 3 weeks. No known injury.   He has constant LBP with left posterior leg pain to his foot. He has chronic constant right posterior leg pain to his foot that is unchanged. Pain is worse with bending, twisting, standing, and walking. Some improvement with changing position. He has tingling in both legs. No numbness or weakness.   No relief with medrol  dose pack or flexeril.   Bowel/Bladder Dysfunction: none  Conservative measures:  Physical therapy: last participated in at [facility] in June 2023. Seeing chiropractor now with some improvement.  Multimodal medical therapy including regular antiinflammatories:  lyrica , cymbalta , flexeril, medrol  dosepack  Injections:   05/17/22: Right L3-4 TF ESI (no relief)   Past Surgery:  Fusion L4-L5 in 2008 by [facility] L4-L5 discectomy in 2006  [name] has no symptoms of cervical myelopathy.  The symptoms are causing a significant impact on the patient's life.   Review of Systems:  A 10 point review of systems is negative, except for the pertinent positives and negatives detailed in the HPI.  Past Medical History: Past Medical History:  Diagnosis Date   CHF (congestive heart failure) (HCC)    CKD (chronic kidney disease), stage III (HCC)    Diabetes mellitus    ED (erectile dysfunction)    Hypertension     Past Surgical History: Past Surgical History:  Procedure Laterality Date   ANKLE SURGERY     both    APPENDECTOMY     back fusion     l4,l5   BACK SURGERY     LAPAROSCOPIC APPENDECTOMY N/A 03/28/2019   Procedure: APPENDECTOMY LAPAROSCOPIC;  Surgeon:  Tye Millet, DO;  Location: [facility] ORS;  Service: General;  Laterality: N/A;   NASAL SINUS SURGERY     SHOULDER ARTHROSCOPY WITH LABRAL REPAIR Left     Allergies: Allergies as of 11/27/2023 - Review Complete 11/27/2023  Allergen Reaction Noted   Food  04/26/2014    Medications: Outpatient Encounter Medications as of 11/27/2023  Medication Sig   cyclobenzaprine (FLEXERIL) 5 MG tablet Take 5 mg by mouth 3 (three) times daily as needed.   atorvastatin  (LIPITOR) 40 MG tablet Take 1 tablet (40 mg total) by mouth daily at 6 PM.   carvedilol  (COREG ) 6.25 MG tablet Take 1 tablet (6.25 mg total) by mouth 2 (two) times daily.   DULoxetine  (CYMBALTA ) 20 MG capsule Take 2 capsules by mouth daily.   FARXIGA  10 MG TABS tablet Take 10 mg by mouth daily.   MOUNJARO 2.5 MG/0.5ML Pen Inject 1.5 mg into the skin once a week.   pregabalin  (LYRICA ) 50 MG capsule Take 1 capsule (50 mg total) by mouth in the morning and at bedtime.   sacubitril -valsartan  (ENTRESTO ) 97-103 MG Take 1 tablet by mouth 2 (two) times daily.   sildenafil  (VIAGRA ) 100 MG tablet Take 100 mg by mouth daily as needed.   spironolactone  (ALDACTONE ) 25 MG tablet Take 1 tablet (25 mg total) by mouth daily.   TRESIBA FLEXTOUCH 200 UNIT/ML FlexTouch Pen 35 units once daily   [DISCONTINUED] pantoprazole  (PROTONIX ) 20 MG tablet Take 1 tablet (20 mg total) by mouth daily. (Patient not  taking: Reported on 06/20/2023)   [DISCONTINUED] rOPINIRole  (REQUIP ) 0.25 MG tablet Take 1 tablet by mouth at bedtime.   No facility-administered encounter medications on file as of 11/27/2023.    Social History: Social History   Tobacco Use   Smoking status: Never   Smokeless tobacco: Never  Vaping Use   Vaping status: Never Used  Substance Use Topics   Alcohol use: No   Drug use: No    Family Medical History: Family History  Problem Relation Age of Onset   Cancer Mother 80       breast cancer   Hypertension Mother    Diabetes Father     Physical  Examination: Vitals:   11/27/23 1302  BP: 128/84    General: Patient is well developed, well nourished, calm, collected, and in no apparent distress. Attention to examination is appropriate.  Respiratory: Patient is breathing without any difficulty.   NEUROLOGICAL:     Awake, alert, oriented to person, place, and time.  Speech is clear and fluent. Fund of knowledge is appropriate.   Cranial Nerves: Pupils equal round and reactive to light.  Facial tone is symmetric.    Well healed lumbar incision.   Has pain when getting up from seated position.    No abnormal lesions on exposed skin.   Strength: Side Biceps Triceps Deltoid Interossei Grip Wrist Ext. Wrist Flex.  R 5 5 5 5 5 5 5   L 5 5 5 5 5 5 5    Side Iliopsoas Quads Hamstring PF DF EHL  R 5 5 5 5 5 5   L 5 5 5 5 5 5    Reflexes are 2+ and symmetric at the biceps, brachioradialis, patella and achilles.   Hoffman's is absent.  Clonus is not present.   Bilateral upper and lower extremity sensation is intact to light touch.     No pain with IR/ER of both hips.   Gait is normal but slow.   Medical Decision Making  Imaging: MRI lumbar spine dated 03/20/22:  FINDINGS: Segmentation: There is transitional lumbosacral anatomy. For consistency with the prior MRI, the transitional segment is considered a sacralized L5 with the operative level being L4-5.   Alignment:  Normal.   Vertebrae: No fracture, suspicious marrow lesion, or significant marrow edema. Prior L4-5 posterior and interbody fusion with solid interbody arthrodesis.   Conus medullaris and cauda equina: Conus extends to the L1 level. Conus and cauda equina appear normal.   Paraspinal and other soft tissues: Postoperative changes in the posterior lumbar soft tissues. No fluid collection.   Disc levels:   T12-L1: A new small left paracentral to subarticular disc protrusion results in mild left lateral recess stenosis. No spinal stenosis or neural  foraminal stenosis.   L1-2: At most minimal disc bulging and facet hypertrophy without stenosis.   L2-3: Disc desiccation. A central disc protrusion with annular fissure, congenitally short pedicles, prominent dorsal epidural fat, and mild facet and ligamentum flavum hypertrophy result in moderate spinal stenosis and mild left lateral recess stenosis. The disc protrusion has mildly decreased in size, most notably in the left subarticular region, with decreased left lateral recess stenosis.   L3-4: Severe right and moderate left facet hypertrophy, congenitally short pedicles, prominent dorsal epidural fat, and at most minimal disc bulging result in unchanged mild bilateral lateral recess stenosis and borderline spinal stenosis without neural foraminal stenosis.   L4-5: Prior posterior decompression and fusion.  No stenosis.   L5-S1: Transitional anatomy with hypoplastic disc.  No  stenosis.   IMPRESSION: 1. Mildly decreased size of the L2-3 disc protrusion with improved left lateral recess patency. Persistent moderate spinal stenosis. 2. New small disc protrusion at T12-L1 with mild left lateral recess stenosis. 3. Severe facet hypertrophy at L3-4 with unchanged mild lateral recess stenosis.     Electronically Signed   By: Dasie Hamburg M.D.   On: 03/21/2022 13:32  I have personally reviewed the images and agree with the above interpretation.  Assessment and Plan: Mr. Volante has a history of chronic pain x 20+ years, had surgery in 2006 and 2008 with improvement. Pain has been worse in last 3 weeks. No known injury.   He has constant LBP with left posterior leg pain to his foot. He has chronic constant right posterior leg pain to his foot that is unchanged. He has tingling in both legs. No numbness or weakness.   MRI from 2023 shows moderate central stenosis L2-L3, facet hypertrophy L3-L4 with moderate central stenosis and mild bilateral lateral recess stenosis. Fusion L4-L5.    Treatment options discussed with patient and following plan made:   - MRI of lumbar spine to further evaluate bilateral lower extremity radiculopathy. No improvement with time, medications, or injections.  - He will get lumbar xrays from [facility] on a CD and drop off for me to review.  - Discussed trial of another muscle relaxer- he wants to hold off. No relief with medrol  dose pack.  - Will schedule phone/in person visit to review MRI and xray results once I get them back. Will leave it up to him.   I spent a total of 35 minutes in face-to-face and non-face-to-face activities related to this patient's care today including review of outside records, review of imaging, review of symptoms, physical exam, discussion of differential diagnosis, discussion of treatment options, and documentation.   Thank you for involving me in the care of this patient.   Glade Boys PA-C Dept. of Neurosurgery

## 2023-11-27 ENCOUNTER — Ambulatory Visit (INDEPENDENT_AMBULATORY_CARE_PROVIDER_SITE_OTHER): Payer: No Typology Code available for payment source | Admitting: Orthopedic Surgery

## 2023-11-27 ENCOUNTER — Encounter: Payer: Self-pay | Admitting: Orthopedic Surgery

## 2023-11-27 VITALS — BP 128/84 | Ht 74.0 in | Wt 223.2 lb

## 2023-11-27 DIAGNOSIS — M4726 Other spondylosis with radiculopathy, lumbar region: Secondary | ICD-10-CM

## 2023-11-27 DIAGNOSIS — M47816 Spondylosis without myelopathy or radiculopathy, lumbar region: Secondary | ICD-10-CM

## 2023-11-27 DIAGNOSIS — Z981 Arthrodesis status: Secondary | ICD-10-CM | POA: Diagnosis not present

## 2023-11-27 DIAGNOSIS — M5416 Radiculopathy, lumbar region: Secondary | ICD-10-CM

## 2023-11-27 DIAGNOSIS — M48061 Spinal stenosis, lumbar region without neurogenic claudication: Secondary | ICD-10-CM

## 2023-11-27 NOTE — Patient Instructions (Signed)
 It was so nice to see you today. Thank you so much for coming in.    I want to get an MRI of your lower back to look into things further. We will get this approved through your insurance and [facility] will call you to schedule the appointment.   [facility] (building with the white pillars) is located off of [address] [place]. The address is [address] [place], [place] [address].    After you have the MRI, it takes 14-21 days for me to get the results back. Once I have them, we will call you to schedule a follow up visit with me to review them.   Get your lumbar xrays from [facility]- they can put on a CD and bring it with you to your follow up.   Please do not hesitate to call if you have any questions or concerns. You can also message me in MyChart.   Glade Boys PA-C [phone]     The physicians and staff at [facility] at [facility] are committed to providing excellent care. You may receive a survey asking for feedback about your experience at our office. We value you your feedback and appreciate you taking the time to to fill it out. The [facility] leadership team is also available to discuss your experience in person, feel free to contact us  [phone].

## 2023-12-04 ENCOUNTER — Ambulatory Visit
Admission: RE | Admit: 2023-12-04 | Discharge: 2023-12-04 | Disposition: A | Discharge status: Home / Self Care | Payer: No Typology Code available for payment source | Source: Ambulatory Visit | Attending: Orthopedic Surgery | Admitting: Orthopedic Surgery

## 2023-12-04 DIAGNOSIS — M5416 Radiculopathy, lumbar region: Secondary | ICD-10-CM | POA: Diagnosis present

## 2023-12-04 DIAGNOSIS — M48061 Spinal stenosis, lumbar region without neurogenic claudication: Secondary | ICD-10-CM | POA: Insufficient documentation

## 2023-12-04 DIAGNOSIS — M47816 Spondylosis without myelopathy or radiculopathy, lumbar region: Secondary | ICD-10-CM | POA: Insufficient documentation

## 2023-12-04 DIAGNOSIS — Z981 Arthrodesis status: Secondary | ICD-10-CM | POA: Insufficient documentation

## 2023-12-16 ENCOUNTER — Telehealth: Payer: Self-pay

## 2023-12-16 ENCOUNTER — Inpatient Hospital Stay
Admission: RE | Admit: 2023-12-16 | Discharge: 2023-12-16 | Disposition: A | Discharge status: Home / Self Care | Payer: Self-pay | Source: Ambulatory Visit | Attending: Orthopedic Surgery | Admitting: Orthopedic Surgery

## 2023-12-16 ENCOUNTER — Other Ambulatory Visit: Payer: Self-pay

## 2023-12-16 DIAGNOSIS — Z049 Encounter for examination and observation for unspecified reason: Secondary | ICD-10-CM

## 2023-12-16 NOTE — Telephone Encounter (Signed)
Xray lumbar spine from [facility] dated 11/12/23 has been loaded to chart.

## 2023-12-22 NOTE — Progress Notes (Unsigned)
Telephone Visit- Progress Note: Referring Physician:  Eden Emms, NP [address] [place],  [place] [address]  Primary Physician:  Eden Emms, NP  This visit was performed via telephone.  Patient location: home Provider location: office  I spent a total of 12 minutes non-face-to-face activities for this visit on the date of this encounter including review of current clinical condition and response to treatment.    Patient has given verbal consent to this telephone visits and we reviewed the limitations of a telephone visit. Patient wishes to proceed.    Chief Complaint:  review imaging  History of Present Illness: [name] is a 48 y.o. male has a history of HTN, CHF, dilated cardiomyopathy, GERD, DM with polyneuropathy.    History of chronic pain x 20+ years, had surgery in 2006 and 2008 with improvement.   Last seen by me on 11/27/23 for LBP and bilateral leg pain. Previous MRI from 2023 showed moderate central stenosis L2-L3, facet hypertrophy L3-L4 with moderate central stenosis and mild bilateral lateral recess stenosis. Fusion L4-L5.   Phone visit scheduled to review his lumbar xrays and MRI.   He continues with constant LBP with left posterior leg pain to his foot. He has chronic constant right posterior leg pain to his foot that is unchanged. Pain is worse with bending, twisting, standing, and walking. He has some numbness and tingling in both legs. No weakness.    He is taking lyrica.    Bowel/Bladder Dysfunction: none   Conservative measures:  Physical therapy: last participated in at [facility] in June 2023. Seeing chiropractor now with some improvement.  Multimodal medical therapy including regular antiinflammatories:  lyrica, cymbalta, flexeril, medrol dosepack  Injections:   05/17/22: Right L3-4 TF ESI (no relief)   Past Surgery:  Fusion L4-L5 in 2008 by [facility] L4-L5 discectomy in 2006     Exam: No exam done as this was a  telephone encounter.     Imaging: MRI of lumbar spine dated 12/04/23:  FINDINGS: Segmentation: As described on the prior exam, there is transitional anatomy, with sacralization of L5.   Alignment:  Physiologic.   Vertebrae: No acute fracture, evidence of discitis, or suspicious osseous lesion. Status post L4-L5 posterior fusion with interbody spacer and solid arthrodesis.   Conus medullaris and cauda equina: Conus extends to the L1 level. Conus and cauda equina appear normal.   Paraspinal and other soft tissues: Atrophy and increased T2 signal in the inferior paraspinous muscles inferior to L4-L5 (series 7, images 5 and 13; series 6, image 4 and 13), which could represent denervation edema.   Disc levels:   T12-L1: Seen only on the sagittal images. A previously noted left paracentral disc protrusion is favored to be unchanged. No spinal canal stenosis or neural foraminal narrowing.   L1-L2: No significant disc bulge. No spinal canal stenosis or neural foraminal narrowing.   L2-L3: Interval increase in the size of a now left paracentral disc protrusion, which extends 5 mm into the spinal canal, effaces the left greater than right lateral recess, and causes moderate to severe spinal canal stenosis. Mild facet arthropathy. Ligamentum flavum hypertrophy. No neural foraminal narrowing.   L3-L4: Left foraminal and extreme lateral disc protrusion, which is new from the prior exam. Moderate facet arthropathy. Narrowing of the lateral recesses. No spinal canal stenosis. Mild left neural foraminal narrowing.   L4-L5: Status post fusion and decompression. No spinal canal stenosis or neural foraminal narrowing.   L5-S1: Transitional  anatomy with hypoplastic disc. No spinal canal stenosis or neural foraminal narrowing.   IMPRESSION: 1. L2-L3 moderate to severe spinal canal stenosis secondary to a left paracentral disc protrusion, which effaces the left greater than right lateral  recess and likely compresses the descending L3 nerve roots. This has progressed from the prior exam. 2. L3-L4 mild left neural foraminal narrowing. Narrowing of the lateral recesses at this level could affect the descending L4 nerve roots. 3. Status post L4-L5 fusion and decompression, without spinal canal stenosis or neural foraminal narrowing. 4. Atrophy and increased T2 signal in the inferior paraspinous muscles inferior to L4-L5, which could represent denervation edema.     Electronically Signed   By: Wiliam Ke M.D.   On: 12/12/2023 18:18  I have personally reviewed the images and agree with the above interpretation.   Lumbar xrays dated 11/12/23:  Instrumented fusion L4-L5.   No report available for above xrays.   Assessment and Plan: [name] has a history of chronic pain x 20+ years, had surgery in 2006 and 2008 with improvement.    He continues with constant LBP with left posterior leg pain to his foot. He has chronic constant right posterior leg pain to his foot that is unchanged.   Fusion at L4-L5 looks good on xrays. He has known left paracentral disc L2-L3 with bilateral lateral recess stensosis and moderate central stenosis. Also with left foraminal disc L3-L4 with bilateral lateral recess stenosis and mild left foraminal stenosis.    Treatment options discussed with patient and following plan made:    - PT for lumbar spine. Orders to [facility].  - Referral to PMR at [facility] to discuss possible lumbar injections.  - Discussed trial of another muscle relaxer- he wants to hold off. No relief with medrol dose pack.  - Follow up with me in 6-8 weeks and prn.   Drake Leach PA-C Neurosurgery

## 2023-12-23 ENCOUNTER — Ambulatory Visit (INDEPENDENT_AMBULATORY_CARE_PROVIDER_SITE_OTHER): Payer: No Typology Code available for payment source | Admitting: Orthopedic Surgery

## 2023-12-23 ENCOUNTER — Encounter: Payer: Self-pay | Admitting: Orthopedic Surgery

## 2023-12-23 DIAGNOSIS — Z981 Arthrodesis status: Secondary | ICD-10-CM

## 2023-12-23 DIAGNOSIS — M48061 Spinal stenosis, lumbar region without neurogenic claudication: Secondary | ICD-10-CM

## 2023-12-23 DIAGNOSIS — M51362 Other intervertebral disc degeneration, lumbar region with discogenic back pain and lower extremity pain: Secondary | ICD-10-CM | POA: Diagnosis not present

## 2023-12-23 DIAGNOSIS — M5416 Radiculopathy, lumbar region: Secondary | ICD-10-CM | POA: Diagnosis not present

## 2023-12-23 DIAGNOSIS — M47816 Spondylosis without myelopathy or radiculopathy, lumbar region: Secondary | ICD-10-CM

## 2024-02-17 NOTE — Progress Notes (Deleted)
 Referring Physician:  Eden Emms, NP [address] [place],  [place] [address]  Primary Physician:  Eden Emms, NP  History of Present Illness: 02/17/2024 [name] has a history of HTN, CHF, dilated cardiomyopathy, GERD, DM with polyneuropathy.   History of chronic pain x 20+ years, had surgery in 2006 and 2008 with improvement.   Did phone visit with me on 12/23/23. Fusion at L4-L5 looks good on xrays. He has known left paracentral disc L2-L3 with bilateral lateral recess stensosis and moderate central stenosis. Also with left foraminal disc L3-L4 with bilateral lateral recess stenosis and mild left foraminal stenosis.   He was sent to PT and PMR for injections. He is here for follow up.   He is scheduled to start PT on 02/24/24. He's had lumbar ESI x 2 with PMR.       He continues with constant LBP with left posterior leg pain to his foot. He has chronic constant right posterior leg pain to his foot that is unchanged. Pain is worse with bending, twisting, standing, and walking. He has some numbness and tingling in both legs. No weakness.    He is taking lyrica.    Bowel/Bladder Dysfunction: none   Conservative measures:  Physical therapy: at [facility] for PT scheduled to start 02/24/24.  Multimodal medical therapy including regular antiinflammatories:  lyrica, cymbalta, flexeril, medrol dosepack  Injections:   02/14/24: bilateral S1 TF ESI  01/17/24: left L3-L4 TF and left S1 TF ESI (no improvement) 05/17/22: Right L3-4 TF ESI (no relief)   Past Surgery:  Fusion L4-L5 in 2008 by [facility] L4-L5 discectomy in 2006  Review of Systems:  A 10 point review of systems is negative, except for the pertinent positives and negatives detailed in the HPI.  Past Medical History: Past Medical History:  Diagnosis Date   CHF (congestive heart failure) (HCC)    CKD (chronic kidney disease), stage III (HCC)    Diabetes mellitus    ED (erectile dysfunction)     Hypertension     Past Surgical History: Past Surgical History:  Procedure Laterality Date   ANKLE SURGERY     both    APPENDECTOMY     back fusion     l4,l5   BACK SURGERY     LAPAROSCOPIC APPENDECTOMY N/A 03/28/2019   Procedure: APPENDECTOMY LAPAROSCOPIC;  Surgeon: Sung Amabile, DO;  Location: [facility] ORS;  Service: General;  Laterality: N/A;   NASAL SINUS SURGERY     SHOULDER ARTHROSCOPY WITH LABRAL REPAIR Left     Allergies: Allergies as of 02/18/2024 - Review Complete 12/23/2023  Allergen Reaction Noted   Food  04/26/2014    Medications: Outpatient Encounter Medications as of 02/18/2024  Medication Sig   atorvastatin (LIPITOR) 40 MG tablet Take 1 tablet (40 mg total) by mouth daily at 6 PM.   carvedilol (COREG) 6.25 MG tablet Take 1 tablet (6.25 mg total) by mouth 2 (two) times daily.   DULoxetine (CYMBALTA) 20 MG capsule Take 2 capsules by mouth daily.   FARXIGA 10 MG TABS tablet Take 10 mg by mouth daily.   MOUNJARO 2.5 MG/0.5ML Pen Inject 1.5 mg into the skin once a week.   pregabalin (LYRICA) 50 MG capsule Take 1 capsule (50 mg total) by mouth in the morning and at bedtime.   sacubitril-valsartan (ENTRESTO) 97-103 MG Take 1 tablet by mouth 2 (two) times daily.   sildenafil (VIAGRA) 100 MG tablet Take 100 mg by mouth daily as  needed.   spironolactone (ALDACTONE) 25 MG tablet Take 1 tablet (25 mg total) by mouth daily.   TRESIBA FLEXTOUCH 200 UNIT/ML FlexTouch Pen 35 units once daily   No facility-administered encounter medications on file as of 02/18/2024.    Social History: Social History   Tobacco Use   Smoking status: Never   Smokeless tobacco: Never  Vaping Use   Vaping status: Never Used  Substance Use Topics   Alcohol use: No   Drug use: No    Family Medical History: Family History  Problem Relation Age of Onset   Cancer Mother 2       breast cancer   Hypertension Mother    Diabetes Father     Physical Examination: There were no vitals filed for  this visit.    Awake, alert, oriented to person, place, and time.  Speech is clear and fluent. Fund of knowledge is appropriate.   Cranial Nerves: Pupils equal round and reactive to light.  Facial tone is symmetric.    Well healed lumbar incision.   Has pain when getting up from seated position.    No abnormal lesions on exposed skin.   Strength: Side Biceps Triceps Deltoid Interossei Grip Wrist Ext. Wrist Flex.  R 5 5 5 5 5 5 5   L 5 5 5 5 5 5 5    Side Iliopsoas Quads Hamstring PF DF EHL  R 5 5 5 5 5 5   L 5 5 5 5 5 5    Reflexes are 2+ and symmetric at the biceps, brachioradialis, patella and achilles.   Hoffman's is absent.  Clonus is not present.   Bilateral upper and lower extremity sensation is intact to light touch.     No pain with IR/ER of both hips.   Gait is normal but slow.   Medical Decision Making  Imaging: none  Assessment and Plan: Mr. Minshall has a history of chronic pain x 20+ years, had surgery in 2006 and 2008 with improvement.    He continues with constant LBP with left posterior leg pain to his foot. He has chronic constant right posterior leg pain to his foot that is unchanged.    Fusion at L4-L5 looks good on xrays. He has known left paracentral disc L2-L3 with bilateral lateral recess stensosis and moderate central stenosis. Also with left foraminal disc L3-L4 with bilateral lateral recess stenosis and mild left foraminal stenosis.    Treatment options discussed with patient and following plan made:    - PT for lumbar spine. Orders to [facility].  - Referral to PMR at [facility] to discuss possible lumbar injections.  - Discussed trial of another muscle relaxer- he wants to hold off. No relief with medrol dose pack.  - Follow up with me in 6-8 weeks and prn.   I spent a total of *** minutes in face-to-face and non-face-to-face activities related to this patient's care today including review of outside records, review of imaging, review of symptoms, physical  exam, discussion of differential diagnosis, discussion of treatment options, and documentation.   Drake Leach PA-C Dept. of Neurosurgery

## 2024-02-18 ENCOUNTER — Ambulatory Visit: Payer: No Typology Code available for payment source | Admitting: Orthopedic Surgery

## 2024-02-20 ENCOUNTER — Ambulatory Visit (INDEPENDENT_AMBULATORY_CARE_PROVIDER_SITE_OTHER): Admitting: Orthopedic Surgery

## 2024-02-20 ENCOUNTER — Encounter: Payer: Self-pay | Admitting: Orthopedic Surgery

## 2024-02-20 ENCOUNTER — Ambulatory Visit
Admission: RE | Admit: 2024-02-20 | Discharge: 2024-02-20 | Disposition: A | Discharge status: Home / Self Care | Source: Ambulatory Visit | Attending: Orthopedic Surgery | Admitting: Orthopedic Surgery

## 2024-02-20 ENCOUNTER — Ambulatory Visit
Admission: RE | Admit: 2024-02-20 | Discharge: 2024-02-20 | Disposition: A | Discharge status: Home / Self Care | Attending: Orthopedic Surgery | Admitting: Orthopedic Surgery

## 2024-02-20 VITALS — BP 132/106 | Ht 74.0 in | Wt 223.0 lb

## 2024-02-20 DIAGNOSIS — Z981 Arthrodesis status: Secondary | ICD-10-CM

## 2024-02-20 DIAGNOSIS — M47816 Spondylosis without myelopathy or radiculopathy, lumbar region: Secondary | ICD-10-CM | POA: Diagnosis present

## 2024-02-20 DIAGNOSIS — M4726 Other spondylosis with radiculopathy, lumbar region: Secondary | ICD-10-CM | POA: Diagnosis not present

## 2024-02-20 DIAGNOSIS — M48061 Spinal stenosis, lumbar region without neurogenic claudication: Secondary | ICD-10-CM | POA: Diagnosis not present

## 2024-02-20 DIAGNOSIS — M5416 Radiculopathy, lumbar region: Secondary | ICD-10-CM | POA: Diagnosis present

## 2024-02-20 NOTE — Patient Instructions (Signed)
 It was so nice to see you today. Thank you so much for coming in.    I want to get xrays of your lower back to look into things further. You can get these at [facility] (building with the white pillars) is located off of [address]. The address is [address] [place], [place] [address]. You do not need an appointment.   Start PT at [facility] as scheduled.   Once I have your xrays back, I will review with Dr. Katrinka Blazing or Dr. Myer Haff regarding further plans including possible surgery. I will send you a message to regroup and schedule your follow up.   Your blood pressure was elevated today. I want you to recheck it at home after you take your blood pressure medications and follow up with your PCP if it remains high. If you have any chest pain, shortness of breath, blurry vision, or headaches then you need to go to ED.    Please do not hesitate to call if you have any questions or concerns. You can also message me in MyChart.   Drake Leach PA-C [phone]     The physicians and staff at [facility] at [facility] are committed to providing excellent care. You may receive a survey asking for feedback about your experience at our office. We value you your feedback and appreciate you taking the time to to fill it out. The [facility] leadership team is also available to discuss your experience in person, feel free to contact us [phone].

## 2024-02-20 NOTE — Progress Notes (Signed)
 Referring Physician:  Eden Emms, NP [address] [place],  [place] [address]  Primary Physician:  Eden Emms, NP  History of Present Illness: 02/20/2024 [name] has a history of HTN, CHF, dilated cardiomyopathy, GERD, DM with polyneuropathy.   History of chronic pain x 20+ years, had surgery in 2006 and 2008 with improvement.   Did phone visit with me on 12/23/23. Fusion at L4-L5 looks good on xrays. He has known left paracentral disc L2-L3 with bilateral lateral recess stensosis and moderate central stenosis. Also with left foraminal disc L3-L4 with bilateral lateral recess stenosis and mild left foraminal stenosis.   He was sent to PT and PMR for injections. He is here for follow up.   He is scheduled to start PT on 02/24/24- he's not sure of the exact date. He's had lumbar ESI x 2 with PMR.   He's not seen any relief with the last 2 injections.   He continues with constant LBP with left posterior leg pain to his foot. He has chronic constant right posterior leg pain to his foot that is unchanged. Pain is worse with bending, twisting, standing, and walking. He has some tingling in both legs. No weakness.    He is taking lyrica.    Bowel/Bladder Dysfunction: none   Conservative measures:  Physical therapy: at [facility] for PT scheduled to start 02/24/24?  Multimodal medical therapy including regular antiinflammatories:  lyrica, cymbalta, flexeril, medrol dosepack  Injections:   02/14/24: bilateral S1 TF ESI  01/17/24: left L3-L4 TF and left S1 TF ESI (no improvement) 05/17/22: Right L3-4 TF ESI (no relief)   Past Surgery:  Fusion L4-L5 in 2008 by [facility] L4-L5 discectomy in 2006  Review of Systems:  A 10 point review of systems is negative, except for the pertinent positives and negatives detailed in the HPI.  Past Medical History: Past Medical History:  Diagnosis Date   CHF (congestive heart failure) (HCC)    CKD (chronic kidney disease),  stage III (HCC)    Diabetes mellitus    ED (erectile dysfunction)    Hypertension     Past Surgical History: Past Surgical History:  Procedure Laterality Date   ANKLE SURGERY     both    APPENDECTOMY     back fusion     l4,l5   BACK SURGERY     LAPAROSCOPIC APPENDECTOMY N/A 03/28/2019   Procedure: APPENDECTOMY LAPAROSCOPIC;  Surgeon: Sung Amabile, DO;  Location: [facility] ORS;  Service: General;  Laterality: N/A;   NASAL SINUS SURGERY     SHOULDER ARTHROSCOPY WITH LABRAL REPAIR Left     Allergies: Allergies as of 02/20/2024 - Review Complete 02/20/2024  Allergen Reaction Noted   Food  04/26/2014   Oxycodone-acetaminophen Other (See Comments) 12/25/2023    Medications: Outpatient Encounter Medications as of 02/20/2024  Medication Sig   atorvastatin (LIPITOR) 40 MG tablet Take 1 tablet (40 mg total) by mouth daily at 6 PM.   carvedilol (COREG) 6.25 MG tablet Take 1 tablet (6.25 mg total) by mouth 2 (two) times daily.   DULoxetine (CYMBALTA) 20 MG capsule Take 2 capsules by mouth daily.   FARXIGA 10 MG TABS tablet Take 10 mg by mouth daily.   MOUNJARO 2.5 MG/0.5ML Pen Inject 1.5 mg into the skin once a week.   pregabalin (LYRICA) 50 MG capsule Take 1 capsule (50 mg total) by mouth in the morning and at bedtime.   sacubitril-valsartan (ENTRESTO) 97-103 MG Take 1 tablet  by mouth 2 (two) times daily.   sildenafil (VIAGRA) 100 MG tablet Take 100 mg by mouth daily as needed.   TRESIBA FLEXTOUCH 200 UNIT/ML FlexTouch Pen 35 units once daily   spironolactone (ALDACTONE) 25 MG tablet Take 1 tablet (25 mg total) by mouth daily.   No facility-administered encounter medications on file as of 02/20/2024.    Social History: Social History   Tobacco Use   Smoking status: Never   Smokeless tobacco: Never  Vaping Use   Vaping status: Never Used  Substance Use Topics   Alcohol use: No   Drug use: No    Family Medical History: Family History  Problem Relation Age of Onset   Cancer  Mother 12       breast cancer   Hypertension Mother    Diabetes Father     Physical Examination: Vitals:   02/20/24 0835  BP: (!) 142/106      Awake, alert, oriented to person, place, and time.  Speech is clear and fluent. Fund of knowledge is appropriate.   Cranial Nerves: Pupils equal round and reactive to light.  Facial tone is symmetric.    No lower lumbar tenderness.   Has pain when getting up from seated position.    No abnormal lesions on exposed skin.   Strength: Side Iliopsoas Quads Hamstring PF DF EHL  R 5 5 5 5 5 5   L 5 5 5 5 5 5    Reflexes are 2+ and symmetric at the patella and achilles.    Clonus is not present.   Bilateral lower extremity sensation is intact to light touch.     Gait is normal but slow.   Medical Decision Making  Imaging: none  Assessment and Plan: Mr. [name] has a history of chronic pain x 20+ years, had surgery in 2006 and 2008 with improvement.    He continues with constant LBP with left posterior leg pain to his foot. He has chronic constant right posterior leg pain to his foot that is unchanged.   No relief with recent ESIs x 2.    Fusion at L4-L5 looks good on xrays. He has known left paracentral disc L2-L3 with bilateral lateral recess stensosis and moderate central stenosis. Also with left foraminal disc L3-L4 with bilateral lateral recess stenosis and mild left foraminal stenosis.    Treatment options discussed with patient and following plan made:   - Lumbar xrays with flex/ext.  - Start PT at [facility] as scheduled.  - Discussed trial of another muscle relaxer- he wants to hold off. No relief with medrol dose pack.  - Once I have xrays back, will review with Dr. Katrinka Blazing or Dr. Myer Haff regarding further surgery options. May be candidate for SCS? Will message him with follow up.   BP was elevated. No symptoms of chest pain, shortness of breath, blurry vision, or headaches. He takes his BP meds at 10am. He will take them when  he gets home and recheck his BP. He will call PCP if not improved. If he develops CP, SOB, blurry vision, or headaches, then he will go to ED.     I spent a total of 20 minutes in face-to-face and non-face-to-face activities related to this patient's care today including review of outside records, review of imaging, review of symptoms, physical exam, discussion of differential diagnosis, discussion of treatment options, and documentation.   Drake Leach PA-C Dept. of Neurosurgery

## 2024-03-10 NOTE — Progress Notes (Unsigned)
 Cardiology Clinic Note   Date: 03/12/2024 ID: [name], DOB 1976/02/12, MRN [id number]  Primary Cardiologist:  Constancia Delton, MD  Chief Complaint   [name] is a 48 y.o. male who presents to the clinic today for routine follow up.   Patient Profile   [name] is followed by Dr. Junnie Olives for the history outlined below.      Past medical history significant for: Chronic HFmrEF. Echo 11/06/2022: EF 40 to 45%.  Global hypokinesis.  Mild LVH.  Grade I DD.  Normal RV size/function.  Mild LAE.  Mild MR.  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 Hypertension. Hyperlipidemia. Lipid panel 10/03/2023: LDL 53, HDL 51, TG 43, total 112. GERD. T2DM.  In summary, patient was first evaluated by Dr. Junnie Olives on 09/10/2019 for chest tightness and shortness of breath at the request of Logan Dawley, FNP.  He reported a 3-week history of chest discomfort described as pressure not related to exertion.  He reported sleep difficulty secondary to orthopnea.  He was evaluated in the emergency room 3 days prior to visit troponin was minimally elevated and BNP was in the 700s.  Chest x-ray demonstrated bilateral hazy opacities concerning for edema.  He was diagnosed with new onset CHF and discharged on Lasix .  Patient was admitted to the hospital on 09/14/2019 with near constant chest tightness and shortness of breath worsening over the prior 1 to 2 days.  He underwent nuclear stress testing which was an intermediate risk showing T wave inversion in II, III, aVF, V5 and V6 during stress and very small partially reversible apical defect likely due to apical thinning or artifact, EF severely decreased <30%.  Echo showed EF <20%, mild LVH, diastolic dysfunction, moderately reduced RV function, normal RV size, moderate LAE, mild RAE, mild MR.  He was started on GDMT prior to discharge.  Repeat echo December 2020 demonstrated EF 25 to 30%.  Limited echo February 2021 demonstrated  improved LV function to 40 to 45%.  Echo November 2022 showed EF 45 to 50%.  Last echo December 2023 showed EF 40 to 45% as detailed above.  Patient was last seen in the office by Dr. Junnie Olives on 08/26/2023 for routine follow-up.  He reported not taking carvedilol  as he thought he was supposed to stop it.  He was compliant with all other medications.  He was doing well at that time and carvedilol  was restarted.     History of Present Illness    Today, patient reports overall he is doing well. Patient denies lower extremity edema, orthopnea or PND. No chest pain, pressure, or tightness. No palpitations. He reports a recent episode of dyspnea while walking for exercise. He was able to complete his walk and dyspnea resolved quickly with rest. His activity has been limited over the last several months secondary to back pain. He has a herniated disk that needs surgical intervention. He has failed injections and he is getting ready to start physical therapy. If pain does not improve he will undergo surgery. 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 He is also able to complete activities around his home. The biggest change for him is not being able to do push ups throughout the day as he was doing prior to back pain flare. He reports compliance with all medications.    ROS: All other systems reviewed and are otherwise negative except as noted in History of Present Illness.  EKGs/Labs Reviewed  EKG Interpretation Date/Time:  Thursday March 12 2024 10:15:27 EDT Ventricular Rate:  76 PR Interval:  170 QRS Duration:  92 QT Interval:  396 QTC Calculation: 445 R Axis:   41  Text Interpretation: Normal sinus rhythm Normal ECG When compared with ECG of 26-Aug-2023 14:32, No significant change was found Confirmed by Morey Ar [phone]) on 03/12/2024 10:20:41 AM   06/20/2023: ALT 19; AST 15; BUN 19; Creatinine, Ser 1.31; Potassium 4.1; Sodium 140   06/20/2023: Hemoglobin 14.9; WBC 3.6    06/20/2023: TSH 0.98    Physical Exam    VS:  BP 112/72   Ht 6\' 2"  (1.88 m)   Wt 220 lb 12.8 oz (100.2 kg)   SpO2 93%   BMI 28.35 kg/m  , BMI Body mass index is 28.35 kg/m.  GEN: Well nourished, well developed, in no acute distress. Neck: No JVD or carotid bruits. Cardiac:  RRR. No murmurs. No rubs or gallops.   Respiratory:  Respirations regular and unlabored. Clear to auscultation without rales, wheezing or rhonchi. GI: Soft, nontender, nondistended. Extremities: Radials/DP/PT 2+ and equal bilaterally. No clubbing or cyanosis. No edema.  Skin: Warm and dry, no rash. Neuro: Strength intact.  Assessment & Plan   Chronic HFmrEF Onset of heart failure October 2020 with initial EF <20%.  Echo December 2023 demonstrated EF 40 to 45%, global hypokinesis, mild LVH, Grade I DD, normal RV size/function, mild LAE, mild MR.  Patient denies lower extremity edema, orthopnea or PND. He experienced mild dyspnea recently when he was walking for exercise. His activity has been limited secondary to back pain. 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  He is tolerating all of his medications. Euvolemic and well compensated on exam. -Will update echo.  - Continue carvedilol , Farxiga, Entresto , spironolactone .  Hypertension BP today 112/72. No report of headaches or dizziness.  - Continue spironolactone , Entresto , carvedilol .  Hyperlipidemia LDL 53 November 2024, at goal. - Continue atorvastatin .  Disposition: Echo. Return in 6 months or sooner as needed.          Signed, Lonell Rives. Alois Colgan, DNP, NP-C

## 2024-03-12 ENCOUNTER — Ambulatory Visit: Attending: Student | Admitting: Student

## 2024-03-12 ENCOUNTER — Encounter: Payer: Self-pay | Admitting: Student

## 2024-03-12 VITALS — BP 112/72 | Ht 74.0 in | Wt 220.8 lb

## 2024-03-12 DIAGNOSIS — I428 Other cardiomyopathies: Secondary | ICD-10-CM | POA: Diagnosis not present

## 2024-03-12 DIAGNOSIS — E7800 Pure hypercholesterolemia, unspecified: Secondary | ICD-10-CM

## 2024-03-12 DIAGNOSIS — I5022 Chronic systolic (congestive) heart failure: Secondary | ICD-10-CM | POA: Diagnosis not present

## 2024-03-12 DIAGNOSIS — I10 Essential (primary) hypertension: Secondary | ICD-10-CM | POA: Diagnosis not present

## 2024-03-12 MED ORDER — SPIRONOLACTONE 25 MG PO TABS: 25.0000 mg | ORAL_TABLET | Freq: Every day | ORAL | 3 refills | Status: DC

## 2024-03-12 MED ORDER — ENTRESTO 97-103 MG PO TABS: 1.0000 | ORAL_TABLET | Freq: Two times a day (BID) | ORAL | 3 refills | Status: DC

## 2024-03-12 NOTE — Patient Instructions (Signed)
 Medication Instructions:  Your Physician recommend you continue on your current medication as directed.    *If you need a refill on your cardiac medications before your next appointment, please call your pharmacy*  Testing/Procedures: Your physician has requested that you have an echocardiogram. Echocardiography is a painless test that uses sound waves to create images of your heart. It provides your doctor with information about the size and shape of your heart and how well your heart's chambers and valves are working.   You may receive an ultrasound enhancing agent through an IV if needed to better visualize your heart during the echo. This procedure takes approximately one hour.  There are no restrictions for this procedure.  This will take place at [address] [facility] [address] [facility], [place] [address]  Please note: We ask at that you not bring children with you during ultrasound (echo/ vascular) testing. Due to room size and safety concerns, children are not allowed in the ultrasound rooms during exams. Our front office staff cannot provide observation of children in our lobby area while testing is being conducted. An adult accompanying a patient to their appointment will only be allowed in the ultrasound room at the discretion of the ultrasound technician under special circumstances. We apologize for any inconvenience.   Follow-Up: At [facility], you and your health needs are our priority.  As part of our continuing mission to provide you with exceptional heart care, our providers are all part of one team.  This team includes your primary Cardiologist (physician) and Advanced Practice Providers or APPs (Physician Assistants and Nurse Practitioners) who all work together to provide you with the care you need, when you need it.  Your next appointment:   6 month(s)  Provider:   You may see Constancia Delton, MD or one of the following Advanced Practice Providers on  your designated Care Team:   Laneta Pintos, NP Gildardo Labrador, PA-C Varney Gentleman, PA-C Cadence [place], PA-C Braidyn Cockayne, NP Morey Ar, NP    We recommend signing up for the patient portal called "MyChart".  Sign up information is provided on this After Visit Summary.  MyChart is used to connect with patients for Virtual Visits (Telemedicine).  Patients are able to view lab/test results, encounter notes, upcoming appointments, etc.  Non-urgent messages can be sent to your provider as well.   To learn more about what you can do with MyChart, go to [email or web address].

## 2024-03-25 ENCOUNTER — Encounter (HOSPITAL_COMMUNITY): Payer: Self-pay

## 2024-04-01 ENCOUNTER — Ambulatory Visit: Payer: No Typology Code available for payment source

## 2024-04-01 VITALS — BP 118/78 | Ht 74.0 in | Wt 223.4 lb

## 2024-04-01 DIAGNOSIS — Z0000 Encounter for general adult medical examination without abnormal findings: Secondary | ICD-10-CM | POA: Diagnosis not present

## 2024-04-01 DIAGNOSIS — Z1211 Encounter for screening for malignant neoplasm of colon: Secondary | ICD-10-CM

## 2024-04-01 NOTE — Patient Instructions (Signed)
 [name] , Thank you for taking time out of your busy schedule to complete your Annual Wellness Visit with me. I enjoyed our conversation and look forward to speaking with you again next year. I, as well as your care team,  appreciate your ongoing commitment to your health goals. Please review the following plan we discussed and let me know if I can assist you in the future. Your Game plan/ To Do List    Referrals: If you haven't heard from the office you've been referred to, please reach out to them at the phone provided.  Colonoscopy: [facility], [place]   Phone: [phone]   Follow up Visits: Next Medicare AWV with our clinical staff: 04/02/25 @1pm  in person Have you seen your provider in the last 6 months (3 months if uncontrolled diabetes)? No Next Office Visit with your provider: 07/31/24  Clinician Recommendations:  Aim for 30 minutes of exercise or brisk walking, 6-8 glasses of water, and 5 servings of fruits and vegetables each day.       This is a list of the screening recommended for you and due dates:  Health Maintenance  Topic Date Due   Pneumococcal Vaccination (2 of 2 - PCV) 06/24/2019   Colon Cancer Screening  Never done   Eye exam for diabetics  12/08/2022   Hemoglobin A1C  10/04/2023   DTaP/Tdap/Td vaccine (2 - Td or Tdap) 12/09/2023   COVID-19 Vaccine (3 - 2024-25 season) 10/03/2024*   Yearly kidney function blood test for diabetes  06/19/2024   Yearly kidney health urinalysis for diabetes  06/19/2024   Flu Shot  06/19/2024   Complete foot exam   06/19/2024   Medicare Annual Wellness Visit  04/01/2025   Hepatitis C Screening  Completed   HIV Screening  Completed   HPV Vaccine  Aged Out   Meningitis B Vaccine  Aged Out  *Topic was postponed. The date shown is not the original due date.    Advanced directives: (Copy Requested) Please bring a copy of your health care power of attorney and living will to the office to  be added to your chart at your convenience. You can mail to [facility] [address] [place], [place] [address] or email to [email or web address] Advance Care Planning is important because it:  [x]  Makes sure you receive the medical care that is consistent with your values, goals, and preferences  [x]  It provides guidance to your family and loved ones and reduces their decisional burden about whether or not they are making the right decisions based on your wishes.  Follow the link provided in your after visit summary or read over the paperwork we have mailed to you to help you started getting your Advance Directives in place. If you need assistance in completing these, please reach out to us  so that we can help you!

## 2024-04-01 NOTE — Progress Notes (Signed)
 Subjective:   [name] is a 48 y.o. who presents for a Medicare Wellness preventive visit.  As a reminder, Annual Wellness Visits don't include a physical exam, and some assessments may be limited, especially if this visit is performed virtually. We may recommend an in-person visit if needed.  Visit Complete: In person  Persons Participating in Visit: Patient.  AWV Questionnaire: No: Patient Medicare AWV questionnaire was not completed prior to this visit.  Cardiac Risk Factors include: advanced age (>13men, >48 women);diabetes mellitus;dyslipidemia;hypertension;male gender     Objective:     Today's Vitals   04/01/24 0925 04/01/24 0930  BP: 118/78   Weight: 223 lb 6.4 oz (101.3 kg)   Height: 6\' 2"  (1.88 m)   PainSc:  6    Body mass index is 28.68 kg/m.     04/01/2024    9:43 AM 03/24/2020    4:22 PM 11/24/2019   11:19 AM 11/02/2019    4:27 PM 09/14/2019    3:49 PM 09/14/2019    6:21 AM 09/07/2019   12:18 PM  Advanced Directives  Does Patient Have a Medical Advance Directive? Yes No No No No No No  Type of Estate agent of [place];Living will        Copy of Healthcare Power of Attorney in Chart? No - copy requested        Would patient like information on creating a medical advance directive?  No - Patient declined No - Patient declined Yes (MAU/Ambulatory/Procedural Areas - Information given) No - Patient declined No - Patient declined     Current Medications (verified) Outpatient Encounter Medications as of 04/01/2024  Medication Sig   atorvastatin  (LIPITOR) 40 MG tablet Take 1 tablet (40 mg total) by mouth daily at 6 PM.   carvedilol  (COREG ) 6.25 MG tablet Take 1 tablet (6.25 mg total) by mouth 2 (two) times daily.   DULoxetine  (CYMBALTA ) 20 MG capsule Take 2 capsules by mouth daily.   FARXIGA 10 MG TABS tablet Take 10 mg by mouth daily.   MOUNJARO 2.5 MG/0.5ML Pen Inject 1.5 mg into the skin once a week.   pregabalin  (LYRICA ) 50 MG  capsule Take 1 capsule (50 mg total) by mouth in the morning and at bedtime.   sacubitril -valsartan  (ENTRESTO ) 97-103 MG Take 1 tablet by mouth 2 (two) times daily.   sildenafil  (VIAGRA ) 100 MG tablet Take 100 mg by mouth daily as needed.   spironolactone  (ALDACTONE ) 25 MG tablet Take 1 tablet (25 mg total) by mouth daily.   TRESIBA FLEXTOUCH 200 UNIT/ML FlexTouch Pen 35 units once daily   No facility-administered encounter medications on file as of 04/01/2024.    Allergies (verified) Food and Oxycodone -acetaminophen    History: Past Medical History:  Diagnosis Date   CHF (congestive heart failure) (HCC)    CKD (chronic kidney disease), stage III (HCC)    Diabetes mellitus    ED (erectile dysfunction)    Hypertension    Past Surgical History:  Procedure Laterality Date   ANKLE SURGERY     both    APPENDECTOMY     back fusion     l4,l5   BACK SURGERY     LAPAROSCOPIC APPENDECTOMY N/A 03/28/2019   Procedure: APPENDECTOMY LAPAROSCOPIC;  Surgeon: Conrado Delay, DO;  Location: [facility] ORS;  Service: General;  Laterality: N/A;   NASAL SINUS SURGERY     SHOULDER ARTHROSCOPY WITH LABRAL REPAIR Left    Family History  Problem Relation Age of Onset  Cancer Mother 27       breast cancer   Hypertension Mother    Diabetes Father    Social History   Socioeconomic History   Marital status: Married    Spouse name: [name]   Number of children: 4   Years of education: Not on file   Highest education level: Not on file  Occupational History   Occupation: unemployed  Tobacco Use   Smoking status: Never   Smokeless tobacco: Never  Vaping Use   Vaping status: Never Used  Substance and Sexual Activity   Alcohol use: No   Drug use: No   Sexual activity: Yes    Birth control/protection: None  Other Topics Concern   Not on file  Social History Narrative   Self employed      Hobbies: Basketball couch    Social Drivers of Health   Financial Resource Strain: Low Risk  (04/01/2024)    Overall Financial Resource Strain (CARDIA)    Difficulty of Paying Living Expenses: Not hard at all  Food Insecurity: No Food Insecurity (04/01/2024)   Hunger Vital Sign    Worried About Running Out of Food in the Last Year: Never true    Ran Out of Food in the Last Year: Never true  Transportation Needs: No Transportation Needs (04/01/2024)   PRAPARE - Administrator, Civil Service (Medical): No    Lack of Transportation (Non-Medical): No  Physical Activity: Sufficiently Active (04/01/2024)   Exercise Vital Sign    Days of Exercise per Week: 5 days    Minutes of Exercise per Session: 30 min  Stress: No Stress Concern Present (04/01/2024)   [employer] of Occupational Health - Occupational Stress Questionnaire    Feeling of Stress : Not at all  Social Connections: Socially Integrated (04/01/2024)   Social Connection and Isolation Panel [NHANES]    Frequency of Communication with Friends and Family: More than three times a week    Frequency of Social Gatherings with Friends and Family: More than three times a week    Attends Religious Services: More than 4 times per year    Active Member of [employer] or Organizations: Yes    Attends Banker Meetings: 1 to 4 times per year    Marital Status: Married    Tobacco Counseling Counseling given: Not Answered    Clinical Intake:  Pre-visit preparation completed: Yes  Pain : 0-10 Pain Score: 6  Pain Type: Chronic pain Pain Location: Back Pain Orientation: Lower Pain Radiating Towards: down legs Pain Descriptors / Indicators: Aching, Shooting, Throbbing Pain Onset: More than a month ago Pain Frequency: Constant Pain Relieving Factors: rest  Pain Relieving Factors: rest  BMI - recorded: 28.68 Nutritional Status: BMI 25 -29 Overweight Nutritional Risks: None Diabetes: Yes CBG done?: No Did pt. bring in CBG monitor from home?: No  Lab Results  Component Value Date   HGBA1C 6.5 04/03/2023   HGBA1C  12.3 10/20/2021   HGBA1C 7.5 (H) 09/15/2019     How often do you need to have someone help you when you read instructions, pamphlets, or other written materials from your doctor or pharmacy?: 1 - Never  Interpreter Needed?: No  Comments: lives with wife/children Information entered by :: B.Ryon Layton,LPN   Activities of Daily Living     04/01/2024    9:43 AM  In your present state of health, do you have any difficulty performing the following activities:  Hearing? 0  Vision? 0  Difficulty concentrating  or making decisions? 0  Walking or climbing stairs? 1  Dressing or bathing? 0  Doing errands, shopping? 0  Preparing Food and eating ? N  Using the Toilet? N  In the past six months, have you accidently leaked urine? N  Do you have problems with loss of bowel control? N  Managing your Medications? N  Managing your Finances? N  Housekeeping or managing your Housekeeping? N    Patient Care Team: Dorothe Gaster, NP as PCP - General (Pain Medicine) Constancia Delton, MD as PCP - Cardiology (Cardiology)  Indicate any recent Medical Services you may have received from other than [facility] providers in the past year (date may be approximate).     Assessment:    This is a routine wellness examination for Ralik.  Hearing/Vision screen Hearing Screening - Comments:: Pt says his hearing is good Vision Screening - Comments:: Pt says his vision good;readers only [facility]/Dr Bulakowski   Goals Addressed             This Visit's Progress    COMPLETED: Patient Stated       03/24/2020, I will continue to do cardiac rehab weekly.      Patient Stated       Rehab for back to move better       Depression Screen     04/01/2024    9:37 AM 06/20/2023    9:12 AM 01/25/2022    9:45 AM 03/24/2020    4:23 PM 11/24/2019   11:14 AM 05/26/2018    2:33 PM 04/26/2014    9:54 AM  PHQ 2/9 Scores  PHQ - 2 Score 0 1 0 0 0 0 0  PHQ- 9 Score  5  0       Fall Risk     04/01/2024    9:35 AM  06/20/2023    9:13 AM 03/24/2020    4:23 PM 12/08/2019   11:11 AM 11/24/2019   11:14 AM  Fall Risk   Falls in the past year? 0 0 0 0 0  Number falls in past yr: 0 0 0    Injury with Fall? 0 0 0    Risk for fall due to : No Fall Risks No Fall Risks No Fall Risks    Follow up Education provided;Falls prevention discussed Falls evaluation completed Falls evaluation completed;Falls prevention discussed      MEDICARE RISK AT HOME:  Medicare Risk at Home Any stairs in or around the home?: Yes If so, are there any without handrails?: Yes Home free of loose throw rugs in walkways, pet beds, electrical cords, etc?: Yes Adequate lighting in your home to reduce risk of falls?: Yes Life alert?: No Use of a cane, walker or w/c?: Yes Grab bars in the bathroom?: No Shower chair or bench in shower?: No Elevated toilet seat or a handicapped toilet?: No  TIMED UP AND GO:  Was the test performed?  Yes  Length of time to ambulate 10 feet: 12 sec Gait slow and steady without use of assistive device  Cognitive Function: 6CIT completed    03/24/2020    4:25 PM  MMSE - Mini Mental State Exam  Orientation to time 5  Orientation to Place 5  Registration 3  Attention/ Calculation 5  Recall 3  Language- repeat 1        04/01/2024    9:59 AM  6CIT Screen  What Year? 0 points  What month? 0 points  What time? 0 points  Count back from 20 0 points  Months in reverse 0 points  Repeat phrase 0 points  Total Score 0 points    Immunizations Immunization History  Administered Date(s) Administered   Influenza,inj,Quad PF,6+ Mos 08/19/2021   PFIZER(Purple Top)SARS-COV-2 Vaccination 06/27/2020, 07/18/2020   Pneumococcal Polysaccharide-23 06/23/2018   Tdap 12/08/2013    Screening Tests Health Maintenance  Topic Date Due   Pneumococcal Vaccine 30-15 Years old (2 of 2 - PCV) 06/24/2019   Colonoscopy  Never done   OPHTHALMOLOGY EXAM  12/08/2022   HEMOGLOBIN A1C  10/04/2023   DTaP/Tdap/Td (2 - Td  or Tdap) 12/09/2023   COVID-19 Vaccine (3 - 2024-25 season) 10/03/2024 (Originally 07/21/2023)   Diabetic kidney evaluation - eGFR measurement  06/19/2024   Diabetic kidney evaluation - Urine ACR  06/19/2024   INFLUENZA VACCINE  06/19/2024   FOOT EXAM  06/19/2024   Medicare Annual Wellness (AWV)  04/01/2025   Hepatitis C Screening  Completed   HIV Screening  Completed   HPV VACCINES  Aged Out   Meningococcal B Vaccine  Aged Out    Health Maintenance  Health Maintenance Due  Topic Date Due   Pneumococcal Vaccine 70-46 Years old (2 of 2 - PCV) 06/24/2019   Colonoscopy  Never done   OPHTHALMOLOGY EXAM  12/08/2022   HEMOGLOBIN A1C  10/04/2023   DTaP/Tdap/Td (2 - Td or Tdap) 12/09/2023   Health Maintenance Items Addressed: Referral sent to GI for colonoscopy  Additional Screening:  Vision Screening: Recommended annual ophthalmology exams for early detection of glaucoma and other disorders of the eye.  Dental Screening: Recommended annual dental exams for proper oral hygiene  Community Resource Referral / Chronic Care Management: CRR required this visit?  No   CCM required this visit?  Appt scheduled with PCP   Plan:    I have personally reviewed and noted the following in the patient's chart:   Medical and social history Use of alcohol, tobacco or illicit drugs  Current medications and supplements including opioid prescriptions. Patient is not currently taking opioid prescriptions. Functional ability and status Nutritional status Physical activity Advanced directives List of other physicians Hospitalizations, surgeries, and ER visits in previous 12 months Vitals Screenings to include cognitive, depression, and falls Referrals and appointments  In addition, I have reviewed and discussed with patient certain preventive protocols, quality metrics, and best practice recommendations. A written personalized care plan for preventive services as well as general preventive  health recommendations were provided to patient.   Nerissa Bannister, LPN   [id number]/40/9811   After Visit Summary: (MyChart) Due to this being a telephonic visit, the after visit summary with patients personalized plan was offered to patient via MyChart   Notes: Nothing significant to report at this time.

## 2024-04-10 ENCOUNTER — Other Ambulatory Visit: Payer: Self-pay

## 2024-04-10 ENCOUNTER — Telehealth: Payer: Self-pay

## 2024-04-10 DIAGNOSIS — Z1211 Encounter for screening for malignant neoplasm of colon: Secondary | ICD-10-CM

## 2024-04-10 MED ORDER — NA SULFATE-K SULFATE-MG SULF 17.5-3.13-1.6 GM/177ML PO SOLN: 1.0000 | Freq: Once | ORAL | 0 refills | Status: AC

## 2024-04-10 NOTE — Telephone Encounter (Signed)
 Gastroenterology Pre-Procedure Review  Request Date: 06/04/24 Requesting Physician: Dr. Ole Berkeley  PATIENT REVIEW QUESTIONS: The patient responded to the following health history questions as indicated:    1. Are you having any GI issues? no 2. Do you have a personal history of Polyps? no 3. Do you have a family history of Colon Cancer or Polyps? no 4. Diabetes Mellitus? yes (Pt takes Farxiga, Mounjaro, Tresiba.  Has been advised and noted in instructions to stop Farxiga (3) days prior to colonoscopy, stop Mounjaro (7) days prior to colonoscopy, take 1/2 usual amount of Tresiba (1) day prior to colonoscopy and contact PCP if he experiences any issues or has concerns regarding his blood sugars) 5. Joint replacements in the past 12 months?no 6. Major health problems in the past 3 months?no 7. Any artificial heart valves, MVP, or defibrillator?no 8. Cardiac issues? Yes patient has Acute systolic heart failure, Dilated cardiomyopathy, Acute systolic heart failure.  [facility].    MEDICATIONS & ALLERGIES:    Patient reports the following regarding taking any anticoagulation/antiplatelet therapy:   Plavix, Coumadin, Eliquis, Xarelto, Lovenox , Pradaxa, Brilinta, or Effient? no Aspirin? no  Patient confirms/reports the following medications:  Current Outpatient Medications  Medication Sig Dispense Refill   atorvastatin  (LIPITOR) 40 MG tablet Take 1 tablet (40 mg total) by mouth daily at 6 PM. 90 tablet 2   carvedilol  (COREG ) 6.25 MG tablet Take 1 tablet (6.25 mg total) by mouth 2 (two) times daily. 60 tablet 3   DULoxetine  (CYMBALTA ) 20 MG capsule Take 2 capsules by mouth daily.     FARXIGA 10 MG TABS tablet Take 10 mg by mouth daily.     MOUNJARO 2.5 MG/0.5ML Pen Inject 1.5 mg into the skin once a week.     pregabalin  (LYRICA ) 50 MG capsule Take 1 capsule (50 mg total) by mouth in the morning and at bedtime. 60 capsule 0   sacubitril -valsartan  (ENTRESTO )  97-103 MG Take 1 tablet by mouth 2 (two) times daily. 60 tablet 3   sildenafil  (VIAGRA ) 100 MG tablet Take 100 mg by mouth daily as needed.     spironolactone  (ALDACTONE ) 25 MG tablet Take 1 tablet (25 mg total) by mouth daily. 30 tablet 3   TRESIBA FLEXTOUCH 200 UNIT/ML FlexTouch Pen 35 units once daily     No current facility-administered medications for this visit.    Patient confirms/reports the following allergies:  Allergies  Allergen Reactions   Food     Chicken, and peaches   Oxycodone -Acetaminophen  Other (See Comments)    Pt states that this is in regards to taking 2 tablets together, causes nausea    No orders of the defined types were placed in this encounter.   AUTHORIZATION INFORMATION Primary Insurance: 1D#: Group #:  Secondary Insurance: 1D#: Group #:  SCHEDULE INFORMATION: Date: 06/04/24 Time: Location: [facility]

## 2024-04-16 ENCOUNTER — Ambulatory Visit

## 2024-05-07 ENCOUNTER — Ambulatory Visit: Attending: Student

## 2024-05-07 DIAGNOSIS — I5022 Chronic systolic (congestive) heart failure: Secondary | ICD-10-CM | POA: Diagnosis not present

## 2024-05-07 LAB — ECHOCARDIOGRAM COMPLETE
AR max vel: 2.15 cm2
AV Area VTI: 2.06 cm2
AV Area mean vel: 2.04 cm2
AV Mean grad: 4 mmHg
AV Peak grad: 7.1 mmHg
Ao pk vel: 1.33 m/s
Area-P 1/2: 2.69 cm2
S' Lateral: 3.72 cm

## 2024-05-11 ENCOUNTER — Ambulatory Visit: Payer: Self-pay | Admitting: Student

## 2024-05-15 ENCOUNTER — Other Ambulatory Visit: Payer: Self-pay | Admitting: Emergency Medicine

## 2024-05-15 ENCOUNTER — Ambulatory Visit: Payer: Self-pay | Admitting: Emergency Medicine

## 2024-05-15 DIAGNOSIS — Z01812 Encounter for preprocedural laboratory examination: Secondary | ICD-10-CM

## 2024-05-15 DIAGNOSIS — R0789 Other chest pain: Secondary | ICD-10-CM

## 2024-05-15 DIAGNOSIS — R079 Chest pain, unspecified: Secondary | ICD-10-CM

## 2024-05-15 MED ORDER — METOPROLOL TARTRATE 100 MG PO TABS: ORAL_TABLET | ORAL | 0 refills | Status: DC

## 2024-05-15 NOTE — Progress Notes (Signed)
 Called and spoke with the patient about the need to get him scheduled for Coronary CT scan.  Patient agreeable with this treatment plan. Patient advised of need to get BMP lab drawn prior to procedure as well as prescription for 1 tablet of 100 mg Metoprolol  sent to his pharmacy that he is supposed to take 2 hours prior to his procedure.  Patient verbalized understanding with all questions and concerns addressed.

## 2024-05-15 NOTE — Progress Notes (Signed)
 Called and spoke with the patient

## 2024-05-15 NOTE — Progress Notes (Signed)
 Last read by Tanda JINNY Claude Ron at 6:06PM on 05/14/2024

## 2024-05-15 NOTE — Progress Notes (Signed)
 Orders for CTA, BMP, and Metoprolol  placed.

## 2024-05-29 ENCOUNTER — Telehealth (HOSPITAL_COMMUNITY): Payer: Self-pay | Admitting: Emergency Medicine

## 2024-05-29 NOTE — Telephone Encounter (Signed)
 Attempted to call patient regarding upcoming cardiac CT appointment. Left message on voicemail with name and callback number Rockwell Alexandria RN Navigator Cardiac [facility] [phone] Office [phone] Cell

## 2024-06-01 ENCOUNTER — Ambulatory Visit: Payer: Self-pay | Admitting: Student

## 2024-06-01 ENCOUNTER — Ambulatory Visit
Admission: RE | Admit: 2024-06-01 | Discharge: 2024-06-01 | Disposition: A | Discharge status: Home / Self Care | Source: Ambulatory Visit | Attending: Student | Admitting: Student

## 2024-06-01 DIAGNOSIS — R0789 Other chest pain: Secondary | ICD-10-CM | POA: Insufficient documentation

## 2024-06-01 DIAGNOSIS — I5189 Other ill-defined heart diseases: Secondary | ICD-10-CM | POA: Diagnosis present

## 2024-06-01 DIAGNOSIS — I429 Cardiomyopathy, unspecified: Secondary | ICD-10-CM | POA: Diagnosis present

## 2024-06-01 DIAGNOSIS — J90 Pleural effusion, not elsewhere classified: Secondary | ICD-10-CM | POA: Diagnosis not present

## 2024-06-01 LAB — POCT I-STAT CREATININE: Creatinine, Ser: 1.4 mg/dL — ABNORMAL HIGH (ref 0.61–1.24)

## 2024-06-01 MED ORDER — DILTIAZEM HCL 25 MG/5ML IV SOLN
10.0000 mg | INTRAVENOUS | Status: DC | PRN
  Filled 2024-06-01: qty 5

## 2024-06-01 MED ORDER — IOHEXOL 350 MG/ML SOLN
100.0000 mL | Freq: Once | INTRAVENOUS | Status: AC | PRN
Start: 2024-06-01 — End: 2024-06-01
  Administered 2024-06-01: 100 mL via INTRAVENOUS

## 2024-06-01 MED ORDER — METOPROLOL TARTRATE 5 MG/5ML IV SOLN
10.0000 mg | INTRAVENOUS | Status: DC | PRN
  Filled 2024-06-01: qty 10

## 2024-06-01 MED ORDER — NITROGLYCERIN 0.4 MG SL SUBL
0.8000 mg | SUBLINGUAL_TABLET | Freq: Once | SUBLINGUAL | Status: AC
  Administered 2024-06-01: 0.8 mg via SUBLINGUAL
  Filled 2024-06-01: qty 25

## 2024-06-01 NOTE — Progress Notes (Signed)
 As pt being m,oved from the scanner pt states that he is going to be sick, pt given and emesis bag and proceeds to vomit. Pt assisted in doing so and given paper towel to clean his mouth. Pt offered zofran  for his nausea and states that he is better after vomiting. Pt given a diet lemon lime soda

## 2024-06-01 NOTE — Progress Notes (Signed)
 Patient tolerated procedure well. Ambulate w/o difficulty. Denies any lightheadedness or being dizzy. Pt denies any pain at this time. Sitting in chair. Pt is encouraged to drink additional water throughout the day and reason explained to patient. Patient verbalized understanding and all questions answered. ABC intact. No further needs at this time. Discharge from procedure area w/o issues.

## 2024-06-02 NOTE — Progress Notes (Signed)
 Last read by Tanda JINNY Claude Ron at 9:31AM on 06/02/2024.

## 2024-06-08 ENCOUNTER — Encounter: Payer: Self-pay | Admitting: Gastroenterology

## 2024-06-08 ENCOUNTER — Other Ambulatory Visit: Payer: Self-pay

## 2024-06-08 ENCOUNTER — Ambulatory Visit
Admission: RE | Admit: 2024-06-08 | Discharge: 2024-06-08 | Disposition: A | Discharge status: Home / Self Care | Attending: Gastroenterology | Admitting: Gastroenterology

## 2024-06-08 ENCOUNTER — Ambulatory Visit: Admitting: Anesthesiology

## 2024-06-08 ENCOUNTER — Encounter
Admission: RE | Disposition: A | Discharge status: Home / Self Care | Payer: Self-pay | Source: Home / Self Care | Attending: Gastroenterology

## 2024-06-08 DIAGNOSIS — I5022 Chronic systolic (congestive) heart failure: Secondary | ICD-10-CM | POA: Diagnosis not present

## 2024-06-08 DIAGNOSIS — K640 First degree hemorrhoids: Secondary | ICD-10-CM | POA: Insufficient documentation

## 2024-06-08 DIAGNOSIS — E1140 Type 2 diabetes mellitus with diabetic neuropathy, unspecified: Secondary | ICD-10-CM | POA: Insufficient documentation

## 2024-06-08 DIAGNOSIS — Z1211 Encounter for screening for malignant neoplasm of colon: Secondary | ICD-10-CM | POA: Insufficient documentation

## 2024-06-08 DIAGNOSIS — K5730 Diverticulosis of large intestine without perforation or abscess without bleeding: Secondary | ICD-10-CM | POA: Insufficient documentation

## 2024-06-08 DIAGNOSIS — Z794 Long term (current) use of insulin: Secondary | ICD-10-CM | POA: Diagnosis not present

## 2024-06-08 DIAGNOSIS — E1122 Type 2 diabetes mellitus with diabetic chronic kidney disease: Secondary | ICD-10-CM | POA: Insufficient documentation

## 2024-06-08 DIAGNOSIS — N183 Chronic kidney disease, stage 3 unspecified: Secondary | ICD-10-CM | POA: Insufficient documentation

## 2024-06-08 DIAGNOSIS — Z803 Family history of malignant neoplasm of breast: Secondary | ICD-10-CM | POA: Insufficient documentation

## 2024-06-08 DIAGNOSIS — I130 Hypertensive heart and chronic kidney disease with heart failure and stage 1 through stage 4 chronic kidney disease, or unspecified chronic kidney disease: Secondary | ICD-10-CM | POA: Insufficient documentation

## 2024-06-08 DIAGNOSIS — Z7985 Long-term (current) use of injectable non-insulin antidiabetic drugs: Secondary | ICD-10-CM | POA: Insufficient documentation

## 2024-06-08 DIAGNOSIS — Z0000 Encounter for general adult medical examination without abnormal findings: Secondary | ICD-10-CM

## 2024-06-08 HISTORY — PX: COLONOSCOPY: SHX5424

## 2024-06-08 SURGERY — COLONOSCOPY
Anesthesia: General

## 2024-06-08 MED ORDER — SODIUM CHLORIDE 0.9 % IV SOLN: INTRAVENOUS | Status: DC

## 2024-06-08 MED ORDER — PROPOFOL 500 MG/50ML IV EMUL
INTRAVENOUS | Status: DC | PRN
  Administered 2024-06-08: 150 ug/kg/min via INTRAVENOUS
  Administered 2024-06-08: 50 mg via INTRAVENOUS

## 2024-06-08 MED ORDER — LIDOCAINE HCL (PF) 2 % IJ SOLN
INTRAMUSCULAR | Status: DC | PRN
  Administered 2024-06-08: 100 mg via INTRADERMAL

## 2024-06-08 NOTE — Op Note (Signed)
 [facility] Gastroenterology Patient Name: [name] Procedure Date: 06/08/2024 8:32 AM MRN: [id number] Account #: [id number] Date of Birth: [birth year] Admit Type: Outpatient Age: 48 Room: [facility] ENDO ROOM 4 Gender: Male Note Status: Finalized Instrument Name: Arvis [id number] Procedure:             Colonoscopy Indications:           Screening for colorectal malignant neoplasm Providers:             Rogelia Copping MD, MD Referring MD:          Lynwood HERO. Cable (Referring MD) Medicines:             Propofol  per Anesthesia Complications:         No immediate complications. Procedure:             Pre-Anesthesia Assessment:                        - Prior to the procedure, a History and Physical was                         performed, and patient medications and allergies were                         reviewed. The patient's tolerance of previous                         anesthesia was also reviewed. The risks and benefits                         of the procedure and the sedation options and risks                         were discussed with the patient. All questions were                         answered, and informed consent was obtained. Prior                         Anticoagulants: The patient has taken no anticoagulant                         or antiplatelet agents. ASA Grade Assessment: II - A                         patient with mild systemic disease. After reviewing                         the risks and benefits, the patient was deemed in                         satisfactory condition to undergo the procedure.                        After obtaining informed consent, the colonoscope was                         passed under direct vision. Throughout the procedure,  the patient's blood pressure, pulse, and oxygen                         saturations were monitored continuously. The                         Colonoscope was introduced through the  anus and                         advanced to the the cecum, identified by appendiceal                         orifice and ileocecal valve. The colonoscopy was                         performed without difficulty. The patient tolerated                         the procedure well. The quality of the bowel                         preparation was excellent. Findings:      The perianal and digital rectal examinations were normal.      A few small-mouthed diverticula were found in the entire colon.      Non-bleeding internal hemorrhoids were found during retroflexion. The       hemorrhoids were Grade I (internal hemorrhoids that do not prolapse). Impression:            - Diverticulosis in the entire examined colon.                        - Non-bleeding internal hemorrhoids.                        - No specimens collected. Recommendation:        - Discharge patient to home.                        - Resume previous diet.                        - Continue present medications.                        - Repeat colonoscopy in 10 years for screening                         purposes. Procedure Code(s):     --- Professional ---                        [phone], Colonoscopy, flexible; diagnostic, including                         collection of specimen(s) by brushing or washing, when                         performed (separate procedure) Diagnosis Code(s):     --- Professional ---  Z12.11, Encounter for screening for malignant neoplasm                         of colon CPT copyright 2022 American Medical Association. All rights reserved. The codes documented in this report are preliminary and upon coder review may  be revised to meet current compliance requirements. Rogelia Copping MD, MD 06/08/2024 8:55:20 AM This report has been signed electronically. Number of Addenda: 0 Note Initiated On: 06/08/2024 8:32 AM Scope Withdrawal Time: 0 hours 6 minutes 21 seconds  Total Procedure Duration:  0 hours 10 minutes 44 seconds  Estimated Blood Loss:  Estimated blood loss: none.      [facility]

## 2024-06-08 NOTE — Anesthesia Postprocedure Evaluation (Signed)
 Anesthesia Post Note  Patient: [name]  Procedure(s) Performed: COLONOSCOPY  Patient location during evaluation: PACU Anesthesia Type: General Level of consciousness: awake and alert, oriented and patient cooperative Pain management: pain level controlled Vital Signs Assessment: post-procedure vital signs reviewed and stable Respiratory status: spontaneous breathing, nonlabored ventilation and respiratory function stable Cardiovascular status: blood pressure returned to baseline and stable Postop Assessment: adequate PO intake Anesthetic complications: no   There were no known notable events for this encounter.   Last Vitals:  Vitals:   06/08/24 0907 06/08/24 0917  BP: 103/75 (!) 135/96  Pulse: 75 67  Resp: 12 13  Temp:    SpO2: 99% 100%    Last Pain:  Vitals:   06/08/24 0917  TempSrc:   PainSc: 0-No pain                 Alfonso Ruths

## 2024-06-08 NOTE — Anesthesia Preprocedure Evaluation (Addendum)
 Anesthesia Evaluation  Patient identified by MRN, date of birth, ID band Patient awake    Reviewed: Allergy & Precautions, NPO status , Patient's Chart, lab work & pertinent test results  History of Anesthesia Complications (+) DIFFICULT AIRWAY and history of anesthetic complications (intubation 03/29/19: easy mask, grade 1 view, 7.5 ETT 1st attempt with MAC 4)  Airway Mallampati: III   Neck ROM: Full    Dental  (+) Missing   Pulmonary neg pulmonary ROS   Pulmonary exam normal breath sounds clear to auscultation       Cardiovascular hypertension, +CHF  Normal cardiovascular exam Rhythm:Regular Rate:Normal  Coronary CT 06/01/24:  1. Coronary calcium  score of 0. 2. Normal coronary origin with right dominance. 3. No evidence of CAD. 4. CAD-RADS 0. Consider non-atherosclerotic causes of chest pain or cardiomyopathy.  ECG 03/12/24: normal  Echo 05/07/24:   1. Left ventricular ejection fraction, by estimation, is 35 to 40%. Left ventricular ejection fraction by 3D volume is 37 %. The left ventricle has moderate to severely decreased function. The left ventricle demonstrates global hypokinesis. Left ventricular diastolic parameters are consistent with Grade I diastolic dysfunction (impaired relaxation). The average left ventricular global longitudinal strain is -11.3 %. The global longitudinal strain is abnormal.   2. Right ventricular systolic function is normal. The right ventricular size is normal. Tricuspid regurgitation signal is inadequate for assessing PA pressure.   3. The mitral valve is normal in structure. No evidence of mitral valve regurgitation. No evidence of mitral stenosis.   4. The aortic valve is normal in structure. Aortic valve regurgitation is not visualized. No aortic stenosis is present.   5. The inferior vena cava is normal in size with greater than 50% respiratory variability, suggesting right atrial pressure of 3  mmHg.   Myocardial perfusion 09/14/19:   T wave inversion was noted during stress in the II, III, aVF, V5 and V6 leads.  This is an intermediate risk study.  The left ventricular ejection fraction is severely decreased (<30%). Correlate with other modality such as Echocardiogram  There was a very small partially reversible apical defect. This is likely due to apical thining or artifact  No evidence of ischemia was otherwise noted on this study    Neuro/Psych  Neuromuscular disease (diabetic neuropathy)    GI/Hepatic negative GI ROS,,,  Endo/Other  diabetes, Type 2    Renal/GU Renal disease (stage III CKD)     Musculoskeletal   Abdominal   Peds  Hematology negative hematology ROS (+)   Anesthesia Other Findings Last dose of Mounjaro 05/24/24.   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  Echo December 2023 demonstrated EF 40 to 45%, global hypokinesis, mild LVH, Grade I DD, normal RV size/function, mild LAE, mild MR.  Patient denies lower extremity edema, orthopnea or PND. He experienced mild dyspnea recently when he was walking for exercise. His activity has been limited secondary to back pain. 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  He is tolerating all of his medications. Euvolemic and well compensated on exam. -Will update echo.  - Continue carvedilol , Farxiga, Entresto , spironolactone .   Hypertension BP today 112/72. No report of headaches or dizziness.  - Continue spironolactone , Entresto , carvedilol .   Hyperlipidemia LDL 53 November 2024, at goal. - Continue atorvastatin .   Disposition: Echo. Return in 6 months or sooner as needed.   Reproductive/Obstetrics  Anesthesia Physical Anesthesia Plan  ASA: 3  Anesthesia Plan: General   Post-op Pain Management:    Induction: Intravenous  PONV Risk Score  and Plan: 2 and Propofol  infusion, TIVA and Treatment may vary due to age or medical condition  Airway Management Planned: Natural Airway  Additional Equipment:   Intra-op Plan:   Post-operative Plan:   Informed Consent: I have reviewed the patients History and Physical, chart, labs and discussed the procedure including the risks, benefits and alternatives for the proposed anesthesia with the patient or authorized representative who has indicated his/her understanding and acceptance.       Plan Discussed with: CRNA  Anesthesia Plan Comments: (LMA/GETA backup discussed.  Patient consented for risks of anesthesia including but not limited to:  - adverse reactions to medications - damage to eyes, teeth, lips or other oral mucosa - nerve damage due to positioning  - sore throat or hoarseness - damage to heart, brain, nerves, lungs, other parts of body or loss of life  Informed patient about role of CRNA in peri- and intra-operative care.  Patient voiced understanding.)         Anesthesia Quick Evaluation

## 2024-06-08 NOTE — Transfer of Care (Signed)
 Immediate Anesthesia Transfer of Care Note  Patient: [name]  Procedure(s) Performed: COLONOSCOPY  Patient Location: PACU  Anesthesia Type:General  Level of Consciousness: drowsy and patient cooperative  Airway & Oxygen Therapy: Patient Spontanous Breathing  Post-op Assessment: Report given to RN and Post -op Vital signs reviewed and stable  Post vital signs: stable  Last Vitals:  Vitals Value Taken Time  BP    Temp    Pulse 81 06/08/24 08:58  Resp 14 06/08/24 08:58  SpO2 100 % 06/08/24 08:58  Vitals shown include unfiled device data.  Last Pain:  Vitals:   06/08/24 0815  TempSrc: Temporal  PainSc: 0-No pain         Complications: No notable events documented.

## 2024-06-08 NOTE — H&P (Signed)
 Rogelia Copping, MD [facility] [address], [address] [place], [place] [address] Phone: [phone] Fax : [phone]  Primary Care Physician:  Wendee Lynwood HERO, NP Primary Gastroenterologist:  Dr. Copping  Pre-Procedure History & Physical: HPI:  [name] is a 48 y.o. male is here for a screening colonoscopy.   Past Medical History:  Diagnosis Date   CHF (congestive heart failure) (HCC)    CKD (chronic kidney disease), stage III (HCC)    Diabetes mellitus    ED (erectile dysfunction)    Hypertension     Past Surgical History:  Procedure Laterality Date   ANKLE SURGERY     both    APPENDECTOMY     back fusion     l4,l5   BACK SURGERY     LAPAROSCOPIC APPENDECTOMY N/A 03/28/2019   Procedure: APPENDECTOMY LAPAROSCOPIC;  Surgeon: Tye Millet, DO;  Location: [facility] ORS;  Service: General;  Laterality: N/A;   NASAL SINUS SURGERY     SHOULDER ARTHROSCOPY WITH LABRAL REPAIR Left     Prior to Admission medications   Medication Sig Start Date End Date Taking? Authorizing Provider  atorvastatin  (LIPITOR) 40 MG tablet Take 1 tablet (40 mg total) by mouth daily at 6 PM. 12/04/21  Yes Agbor-Etang, Redell, MD  carvedilol  (COREG ) 6.25 MG tablet Take 1 tablet (6.25 mg total) by mouth 2 (two) times daily. 08/26/23  Yes Agbor-Etang, Redell, MD  DULoxetine  (CYMBALTA ) 20 MG capsule Take 2 capsules by mouth daily. 07/05/21  Yes [provider]  FARXIGA 10 MG TABS tablet Take 10 mg by mouth daily.   Yes [provider]  pregabalin  (LYRICA ) 50 MG capsule Take 1 capsule (50 mg total) by mouth in the morning and at bedtime. 05/22/23  Yes Wendee Lynwood HERO, NP  sacubitril -valsartan  (ENTRESTO ) 97-103 MG Take 1 tablet by mouth 2 (two) times daily. 03/12/24  Yes Wittenborn, Barnie, NP  spironolactone  (ALDACTONE ) 25 MG tablet Take 1 tablet (25 mg total) by mouth daily. 03/12/24 06/10/24 Yes Wittenborn, Deborah, NP  TRESIBA FLEXTOUCH 200 UNIT/ML FlexTouch Pen 35 units once daily 01/13/22  Yes [provider]  metoprolol  tartrate (LOPRESSOR ) 100 MG tablet TAKE 1 TABLET 2 HR PRIOR TO CARDIAC PROCEDURE Patient not taking: Reported on 06/08/2024 05/15/24   Loistine Barnie, NP  MOUNJARO 2.5 MG/0.5ML Pen Inject 1.5 mg into the skin once a week. 12/22/21   [provider]  sildenafil  (VIAGRA ) 100 MG tablet Take 100 mg by mouth daily as needed. 10/30/21   [provider]    Allergies as of 04/10/2024 - Review Complete 04/10/2024  Allergen Reaction Noted   Food  04/26/2014   Oxycodone -acetaminophen  Other (See Comments) 12/25/2023    Family History  Problem Relation Age of Onset   Cancer Mother 104       breast cancer   Hypertension Mother    Diabetes Father     Social History   Socioeconomic History   Marital status: Married    Spouse name: [name]   Number of children: 4   Years of education: Not on file   Highest education level: Not on file  Occupational History   Occupation: unemployed  Tobacco Use   Smoking status: Never   Smokeless tobacco: Never  Vaping Use   Vaping status: Never Used  Substance and Sexual Activity   Alcohol use: No   Drug use: No   Sexual activity: Yes    Birth control/protection: None  Other Topics Concern   Not on file  Social History Narrative   Self employed      Hobbies: Basketball couch    Social Drivers of Health   Financial Resource Strain: Low Risk  (04/16/2024)   Received from [facility]   Overall Financial Resource Strain (CARDIA)    Difficulty of Paying Living Expenses: Not hard at all  Food Insecurity: No Food Insecurity (04/16/2024)   Received from [facility]   Hunger Vital Sign    Within the past 12 months, you worried that your food would run out before you got the money to buy more.: Never true    Within the past 12 months, the food you bought just didn't last and you didn't have money to get more.: Never true  Transportation Needs: No Transportation Needs  (04/16/2024)   Received from [facility] - Transportation    In the past 12 months, has lack of transportation kept you from medical appointments or from getting medications?: No    Lack of Transportation (Non-Medical): No  Physical Activity: Sufficiently Active (04/01/2024)   Exercise Vital Sign    Days of Exercise per Week: 5 days    Minutes of Exercise per Session: 30 min  Stress: No Stress Concern Present (04/01/2024)   [employer] of Occupational Health - Occupational Stress Questionnaire    Feeling of Stress : Not at all  Social Connections: Socially Integrated (04/01/2024)   Social Connection and Isolation Panel    Frequency of Communication with Friends and Family: More than three times a week    Frequency of Social Gatherings with Friends and Family: More than three times a week    Attends Religious Services: More than 4 times per year    Active Member of [employer] or Organizations: Yes    Attends Banker Meetings: 1 to 4 times per year    Marital Status: Married  Catering manager Violence: Not At Risk (04/01/2024)   Humiliation, Afraid, Rape, and Kick questionnaire    Fear of Current or Ex-Partner: No    Emotionally Abused: No    Physically Abused: No    Sexually Abused: No    Review of Systems: See HPI, otherwise negative ROS  Physical Exam: BP (!) 127/94   Pulse 68   Temp (!) 96.2 F (35.7 C) (Temporal)   Resp 16   Ht 6' 1.5 (1.867 m)   Wt 101.3 kg   SpO2 100%   BMI 29.07 kg/m  General:   Alert,  pleasant and cooperative in NAD Head:  Normocephalic and atraumatic. Neck:  Supple; no masses or thyromegaly. Lungs:  Clear throughout to auscultation.    Heart:  Regular rate and rhythm. Abdomen:  Soft, nontender and nondistended. Normal bowel sounds, without guarding, and without rebound.   Neurologic:  Alert and  oriented x4;  grossly normal neurologically.  Impression/Plan: [name] is now here to undergo a  screening colonoscopy.  Risks, benefits, and alternatives regarding colonoscopy have been reviewed with the patient.  Questions have been answered.  All parties agreeable.

## 2024-06-09 ENCOUNTER — Encounter: Payer: Self-pay | Admitting: Gastroenterology

## 2024-06-26 ENCOUNTER — Ambulatory Visit: Admitting: Cardiology

## 2024-07-31 ENCOUNTER — Encounter: Payer: Self-pay | Admitting: Nurse Practitioner

## 2024-07-31 ENCOUNTER — Ambulatory Visit: Admitting: Nurse Practitioner

## 2024-07-31 VITALS — BP 116/84 | HR 76 | Temp 98.0°F | Ht 73.5 in | Wt 223.0 lb

## 2024-07-31 DIAGNOSIS — Z0000 Encounter for general adult medical examination without abnormal findings: Secondary | ICD-10-CM | POA: Diagnosis not present

## 2024-07-31 DIAGNOSIS — I420 Dilated cardiomyopathy: Secondary | ICD-10-CM | POA: Diagnosis not present

## 2024-07-31 DIAGNOSIS — Z23 Encounter for immunization: Secondary | ICD-10-CM | POA: Diagnosis not present

## 2024-07-31 DIAGNOSIS — Z125 Encounter for screening for malignant neoplasm of prostate: Secondary | ICD-10-CM

## 2024-07-31 DIAGNOSIS — I10 Essential (primary) hypertension: Secondary | ICD-10-CM

## 2024-07-31 DIAGNOSIS — E1142 Type 2 diabetes mellitus with diabetic polyneuropathy: Secondary | ICD-10-CM

## 2024-07-31 DIAGNOSIS — E1165 Type 2 diabetes mellitus with hyperglycemia: Secondary | ICD-10-CM | POA: Diagnosis not present

## 2024-07-31 DIAGNOSIS — Z794 Long term (current) use of insulin: Secondary | ICD-10-CM

## 2024-07-31 LAB — TSH: TSH: 1.34 u[IU]/mL (ref 0.35–5.50)

## 2024-07-31 LAB — COMPREHENSIVE METABOLIC PANEL WITH GFR
ALT: 15 U/L (ref 0–53)
AST: 13 U/L (ref 0–37)
Albumin: 4.4 g/dL (ref 3.5–5.2)
Alkaline Phosphatase: 73 U/L (ref 39–117)
BUN: 16 mg/dL (ref 6–23)
CO2: 30 meq/L (ref 19–32)
Calcium: 9.4 mg/dL (ref 8.4–10.5)
Chloride: 104 meq/L (ref 96–112)
Creatinine, Ser: 1.3 mg/dL (ref 0.40–1.50)
GFR: 64.8 mL/min (ref >60.00)
Glucose, Bld: 146 mg/dL — ABNORMAL HIGH (ref 70–99)
Potassium: 3.8 meq/L (ref 3.5–5.1)
Sodium: 143 meq/L (ref 135–145)
Total Bilirubin: 0.5 mg/dL (ref 0.2–1.2)
Total Protein: 6.8 g/dL (ref 6.0–8.3)

## 2024-07-31 LAB — CBC WITH DIFFERENTIAL/PLATELET
Basophils Absolute: 0 K/uL (ref 0.0–0.1)
Basophils Relative: 0.4 % (ref 0.0–3.0)
Eosinophils Absolute: 0.2 K/uL (ref 0.0–0.7)
Eosinophils Relative: 5 % (ref 0.0–5.0)
HCT: 43.1 % (ref 39.0–52.0)
Hemoglobin: 14.2 g/dL (ref 13.0–17.0)
Lymphocytes Relative: 29.7 % (ref 12.0–46.0)
Lymphs Abs: 1.1 K/uL (ref 0.7–4.0)
MCHC: 32.9 g/dL (ref 30.0–36.0)
MCV: 85.6 fl (ref 78.0–100.0)
Monocytes Absolute: 0.3 K/uL (ref 0.1–1.0)
Monocytes Relative: 7.5 % (ref 3.0–12.0)
Neutro Abs: 2.2 K/uL (ref 1.4–7.7)
Neutrophils Relative %: 57.4 % (ref 43.0–77.0)
Platelets: 157 K/uL (ref 150.0–400.0)
RBC: 5.03 Mil/uL (ref 4.22–5.81)
RDW: 13.9 % (ref 11.5–15.5)
WBC: 3.8 K/uL — ABNORMAL LOW (ref 4.0–10.5)

## 2024-07-31 LAB — MICROALBUMIN / CREATININE URINE RATIO
Creatinine,U: 72.8 mg/dL
Microalb Creat Ratio: 9.9 mg/g (ref 0.0–30.0)
Microalb, Ur: 0.7 mg/dL (ref 0.0–1.9)

## 2024-07-31 LAB — POCT GLYCOSYLATED HEMOGLOBIN (HGB A1C): Hemoglobin A1C: 8.2 % — AB (ref 4.0–5.6)

## 2024-07-31 LAB — LIPID PANEL
Cholesterol: 122 mg/dL (ref 0–200)
HDL: 46.4 mg/dL (ref >39.00)
LDL Cholesterol: 62 mg/dL (ref 0–99)
NonHDL: 75.96
Total CHOL/HDL Ratio: 3
Triglycerides: 69 mg/dL (ref 0.0–149.0)
VLDL: 13.8 mg/dL (ref 0.0–40.0)

## 2024-07-31 LAB — PSA: PSA: 0.33 ng/mL (ref 0.10–4.00)

## 2024-07-31 NOTE — Assessment & Plan Note (Signed)
 Patient currently followed by endocrinology maintained on Farxiga 10,, Tresiba 35 mg.  A1c elevated today follow-up with endocrinology for further management

## 2024-07-31 NOTE — Assessment & Plan Note (Signed)
 Patient currently maintained on Entresto  and carvedilol .  Followed by cardiology.  Continue taking medication as prescribed follow-up with specialist as recommended

## 2024-07-31 NOTE — Assessment & Plan Note (Signed)
 Discussed age-appropriate immunizations and screening exams.  Did review patient's personal, surgical, social, family histories.  Patient up-to-date on all age-appropriate vaccinations he would like.  Update flu and tetanus vaccine today.  Patient is up-to-date on CRC screening.  PSA for prostate cancer screening today.  Patient was given information at discharge about preventative healthcare maintenance with anticipatory guidance

## 2024-07-31 NOTE — Patient Instructions (Signed)
 Nice to see you today We did update your tetanus vaccine and your flu vaccine today  Follow up with me in 1 year, sooner if you need me

## 2024-07-31 NOTE — Assessment & Plan Note (Signed)
 Currently maintained on Lyrica 

## 2024-07-31 NOTE — Progress Notes (Signed)
 Established Patient Office Visit  Subjective   Patient ID: [name], male    DOB: 06/23/1976  Age: 48 y.o. MRN: [id number]  Chief Complaint  Patient presents with   Annual Exam    HPI  HLD: Currently maintained on atorvastatin  40 mg daily  DM2: Currently maintained on Farxiga 10 mg daily, Mounjaro 2.5 mg daily, Tresiba 35 units daily.  Patient is followed by endocrinology through [facility]. He last saw them 3 months. He does check them at home and tries to get them daily. No low glucose but highest was 300 a few months ago. On average 120  Heart failure: Patient currently maintained on carvedilol  6.25 mg twice daily along with Entresto  97-103 mg twice daily.  Patient is followed by cardiology. Does check BP at home daily. He will get some light headedness once the other day. He stood up quickly  for complete physical and follow up of chronic conditions.  Immunizations: -Tetanus: Completed in 2015, due.  Updated today -Influenza: Updated today -Shingles: Too young -Pneumonia: Completed 2019  Diet: Fair diet. He is eating 4 meals and snacks. He drinks diet soda and water Exercise: No regular exercise lately because of his back   Eye exam: Completes annually. Thinks up to date and does have reading glasses  Dental exam: Completes semi-annually    Colonoscopy: Completed in 06/08/2024, repeat in 10 years patient due 2035 Lung Cancer Screening: N/A  PSA: Due  Sleep: goes to bed 1-2  and will 8-12. Feels rested fo the most part. States that he is sleep        Review of Systems  Constitutional:  Negative for chills and fever.  Respiratory:  Negative for shortness of breath.   Cardiovascular:  Negative for chest pain and leg swelling.  Gastrointestinal:  Negative for abdominal pain, blood in stool, constipation, diarrhea, nausea and vomiting.       BM daily   Genitourinary:  Negative for dysuria and hematuria.  Neurological:  Positive for dizziness and  tingling. Negative for headaches.  Psychiatric/Behavioral:  Negative for hallucinations and suicidal ideas.       Objective:     BP 116/84   Pulse 76   Temp 98 F (36.7 C) (Oral)   Ht 6' 1.5 (1.867 m)   Wt 223 lb (101.2 kg)   SpO2 97%   BMI 29.02 kg/m  BP Readings from Last 3 Encounters:  07/31/24 116/84  06/08/24 (!) 135/96  06/01/24 (!) 137/91   Wt Readings from Last 3 Encounters:  07/31/24 223 lb (101.2 kg)  06/08/24 223 lb 6.4 oz (101.3 kg)  04/01/24 223 lb 6.4 oz (101.3 kg)   SpO2 Readings from Last 3 Encounters:  07/31/24 97%  06/08/24 100%  03/12/24 93%      Physical Exam Vitals and nursing note reviewed.  Constitutional:      Appearance: Normal appearance.  HENT:     Right Ear: Tympanic membrane, ear canal and external ear normal.     Left Ear: Tympanic membrane, ear canal and external ear normal.     Mouth/Throat:     Mouth: Mucous membranes are moist.     Pharynx: Oropharynx is clear.  Eyes:     Extraocular Movements: Extraocular movements intact.     Pupils: Pupils are equal, round, and reactive to light.  Cardiovascular:     Rate and Rhythm: Normal rate and regular rhythm.     Pulses: Normal pulses.     Heart sounds: Normal heart  sounds.  Pulmonary:     Effort: Pulmonary effort is normal.     Breath sounds: Normal breath sounds.  Abdominal:     General: Bowel sounds are normal. There is no distension.     Palpations: There is no mass.     Tenderness: There is no abdominal tenderness.     Hernia: No hernia is present.  Genitourinary:    Comments: deferred Musculoskeletal:     Right lower leg: No edema.     Left lower leg: No edema.  Lymphadenopathy:     Cervical: No cervical adenopathy.  Skin:    General: Skin is warm.  Neurological:     General: No focal deficit present.     Mental Status: He is alert.     Deep Tendon Reflexes:     Reflex Scores:      Bicep reflexes are 2+ on the right side and 2+ on the left side.      Patellar  reflexes are 2+ on the right side and 2+ on the left side.    Comments: Bilateral upper and lower extremity strength 5/5  Psychiatric:        Mood and Affect: Mood normal.        Behavior: Behavior normal.        Thought Content: Thought content normal.        Judgment: Judgment normal.      Results for orders placed or performed in visit on 07/31/24  POCT glycosylated hemoglobin (Hb A1C)  Result Value Ref Range   Hemoglobin A1C 8.2 (A) 4.0 - 5.6 %   HbA1c POC (<> result, manual entry)     HbA1c, POC (prediabetic range)     HbA1c, POC (controlled diabetic range)        The ASCVD Risk score (Arnett DK, et al., 2019) failed to calculate for the following reasons:   The valid total cholesterol range is 130 to 320 mg/dL    Assessment & Plan:   Problem List Items Addressed This Visit       Cardiovascular and Mediastinum   Essential hypertension   Relevant Medications   torsemide  (DEMADEX ) 20 MG tablet   Dilated cardiomyopathy (HCC)   Patient currently maintained on Entresto  and carvedilol .  Followed by cardiology.  Continue taking medication as prescribed follow-up with specialist as recommended      Relevant Medications   torsemide  (DEMADEX ) 20 MG tablet     Endocrine   Diabetic polyneuropathy associated with type 2 diabetes mellitus (HCC)   Currently maintained on Lyrica       Relevant Orders   TSH   Type 2 diabetes mellitus with hyperglycemia, with long-term current use of insulin  [facility])   Patient currently followed by endocrinology maintained on Farxiga 10,, Tresiba 35 mg.  A1c elevated today follow-up with endocrinology for further management      Relevant Orders   Comprehensive metabolic panel with GFR   CBC with Differential/Platelet   POCT glycosylated hemoglobin (Hb A1C) (Completed)   Microalbumin / creatinine urine ratio   Lipid panel     Other   Preventative health care - Primary   Discussed age-appropriate immunizations and screening exams.  Did  review patient's personal, surgical, social, family histories.  Patient up-to-date on all age-appropriate vaccinations he would like.  Update flu and tetanus vaccine today.  Patient is up-to-date on CRC screening.  PSA for prostate cancer screening today.  Patient was given information at discharge about preventative healthcare maintenance with anticipatory guidance  Relevant Orders   TSH   Other Visit Diagnoses       Screening for prostate cancer       Relevant Orders   PSA     Need for Tdap vaccination       Relevant Orders   Tdap vaccine greater than or equal to 7yo IM (Completed)     Need for influenza vaccination       Relevant Orders   Flu vaccine trivalent PF, 6mos and older(Flulaval,Afluria,Fluarix,Fluzone) (Completed)       Return in about 1 year (around 07/31/2025) for CPE and Labs.    Adina Crandall, NP

## 2024-08-04 ENCOUNTER — Ambulatory Visit: Payer: Self-pay | Admitting: Nurse Practitioner

## 2024-08-06 ENCOUNTER — Encounter: Payer: Self-pay | Admitting: Cardiology

## 2024-08-06 ENCOUNTER — Other Ambulatory Visit: Payer: Self-pay | Admitting: Student

## 2024-08-06 ENCOUNTER — Ambulatory Visit: Attending: Cardiology | Admitting: Cardiology

## 2024-08-06 VITALS — BP 122/72 | HR 70 | Ht 74.0 in | Wt 216.2 lb

## 2024-08-06 DIAGNOSIS — I428 Other cardiomyopathies: Secondary | ICD-10-CM

## 2024-08-06 DIAGNOSIS — I10 Essential (primary) hypertension: Secondary | ICD-10-CM | POA: Diagnosis not present

## 2024-08-06 MED ORDER — FARXIGA 10 MG PO TABS: 10.0000 mg | ORAL_TABLET | Freq: Every day | ORAL | 3 refills | Status: DC

## 2024-08-06 MED ORDER — SACUBITRIL-VALSARTAN 97-103 MG PO TABS: 1.0000 | ORAL_TABLET | Freq: Two times a day (BID) | ORAL | 2 refills | Status: DC

## 2024-08-06 NOTE — Patient Instructions (Signed)
 Medication Instructions:  Your physician recommends that you continue on your current medications as directed. Please refer to the Current Medication list given to you today.   *If you need a refill on your cardiac medications before your next appointment, please call your pharmacy*  Lab Work: Your provider would like for you to have following labs drawn today BMP, CBC.   If you have labs (blood work) drawn today and your tests are completely normal, you will receive your results only by: MyChart Message (if you have MyChart) OR A paper copy in the mail If you have any lab test that is abnormal or we need to change your treatment, we will call you to review the results.  Testing/Procedures:   [facility] [address] [place], [place] [address] Please go to the [facility] and check-in with the desk attendant.   Magnetic resonance imaging (MRI) is a painless test that produces images of the inside of the body without using Xrays.  During an MRI, strong magnets and radio waves work together in a Data processing manager to form detailed images.   MRI images may provide more details about a medical condition than X-rays, CT scans, and ultrasounds can provide.  You may be given earphones to listen for instructions.  You may eat a light breakfast and take medications as ordered with the exception of furosemide , hydrochlorothiazide, chlorthalidone or spironolactone  (or any other fluid pill). If you are undergoing a stress MRI, please avoid stimulants for 12 hr prior to test. (I.e. Caffeine, nicotine, chocolate, or antihistamine medications)  If your provider has ordered anti-anxiety medications for this test, then you will need a driver.  An IV will be inserted into one of your veins. Contrast material will be injected into your IV. It will leave your body through your urine within a day. You may be told to drink plenty of fluids to help flush the contrast material out  of your system.  You will be asked to remove all metal, including: Watch, jewelry, and other metal objects including hearing aids, hair pieces and dentures. Also wearable glucose monitoring systems (ie. Freestyle Libre and Omnipods) (Braces and fillings normally are not a problem.)   TEST WILL TAKE APPROXIMATELY 1 HOUR  PLEASE NOTIFY SCHEDULING AT LEAST 24 HOURS IN ADVANCE IF YOU ARE UNABLE TO KEEP YOUR APPOINTMENT. [phone]  For more information and frequently asked questions, please visit our website : [email or web address]  Please call the Cardiac Imaging Nurse Navigators with any questions/concerns. [phone] Office    Follow-Up: At [facility], you and your health needs are our priority.  As part of our continuing mission to provide you with exceptional heart care, our providers are all part of one team.  This team includes your primary Cardiologist (physician) and Advanced Practice Providers or APPs (Physician Assistants and Nurse Practitioners) who all work together to provide you with the care you need, when you need it.  Your next appointment:   5 month(s)  Provider:   You may see Redell Cave, MD or one of the following Advanced Practice Providers on your designated Care Team:   Lonni Meager, NP Lesley Maffucci, PA-C Bernardino Bring, PA-C Cadence [place], PA-C Tylene Lunch, NP Barnie Hila, NP    We recommend signing up for the patient portal called MyChart.  Sign up information is provided on this After Visit Summary.  MyChart is used to connect with patients for Virtual Visits (Telemedicine).  Patients are able to view lab/test results,  encounter notes, upcoming appointments, etc.  Non-urgent messages can be sent to your provider as well.   To learn more about what you can do with MyChart, go to [email or web address].

## 2024-08-06 NOTE — Progress Notes (Signed)
 Cardiology Office Note:    Date:  08/06/2024   ID:  [name], DOB 01/27/1976, MRN [id number]  PCP:  Wendee Lynwood HERO, NP  Cardiologist:  Redell Cave, MD  Electrophysiologist:  None   Referring MD: Wendee Lynwood HERO, NP   Chief Complaint  Patient presents with   Follow-up    2 month follow up  after CT pt has been doing well with no complaints of chest pain, chest pressure or SOB, medciation reviewed verbally with patient    History of Present Illness:    [name] is a 48 y.o. male with a hx of NICM (initial EF <20 improved to 40-45%), hypertension, diabetes, hyperlipidemia, degenerative disc disease s/p back surgery who presents for follow-up.    Denies chest pain or shortness of breath, edema adequately controlled with as needed torsemide .  Compliant with medications as prescribed.  Had a repeat echo 6/25 showing moderately reduced EF 35 to 40%, slightly worse from prior.  Currently on disability due to back issues/herniated disc from DDD.   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     Past Medical History:  Diagnosis Date   CHF (congestive heart failure) (HCC)    CKD (chronic kidney disease), stage III (HCC)    Diabetes mellitus    ED (erectile dysfunction)    Hypertension     Past Surgical History:  Procedure Laterality Date   ANKLE SURGERY     both    APPENDECTOMY     back fusion     l4,l5   BACK SURGERY     COLONOSCOPY N/A 06/08/2024   Procedure: COLONOSCOPY;  Surgeon: Jinny Carmine, MD;  Location: [facility] ENDOSCOPY;  Service: Endoscopy;  Laterality: N/A;   LAPAROSCOPIC APPENDECTOMY N/A 03/28/2019   Procedure: APPENDECTOMY LAPAROSCOPIC;  Surgeon: Tye Millet, DO;  Location: [facility] ORS;  Service: General;  Laterality: N/A;   NASAL SINUS SURGERY     SHOULDER ARTHROSCOPY WITH  LABRAL REPAIR Left     Current Medications: Current Meds  Medication Sig   atorvastatin  (LIPITOR) 40 MG tablet Take 1 tablet (40 mg total) by mouth daily at 6 PM.   carvedilol  (COREG ) 6.25 MG tablet Take 1 tablet (6.25 mg total) by mouth 2 (two) times daily.   DULoxetine  (CYMBALTA ) 20 MG capsule Take 2 capsules by mouth daily.   MOUNJARO 2.5 MG/0.5ML Pen Inject 1.5 mg into the skin once a week.   pregabalin  (LYRICA ) 50 MG capsule Take 1 capsule (50 mg total) by mouth in the morning and at bedtime.   sildenafil  (VIAGRA ) 100 MG tablet Take 100 mg by mouth daily as needed.   spironolactone  (ALDACTONE ) 25 MG tablet Take 1 tablet (25 mg total) by mouth daily.   torsemide  (DEMADEX ) 20 MG tablet Take 20 mg by mouth daily as needed.   TRESIBA FLEXTOUCH 200 UNIT/ML FlexTouch Pen 35 units once daily   [DISCONTINUED] FARXIGA  10 MG TABS tablet Take 10 mg by mouth daily.   [DISCONTINUED] sacubitril -valsartan  (ENTRESTO ) 97-103 MG Take 1 tablet by mouth 2 (two) times daily.     Allergies:   Food   Social History   Socioeconomic History   Marital status: Married    Spouse name: [name]   Number of children: 4   Years of education: Not on file  Highest education level: Not on file  Occupational History   Occupation: unemployed  Tobacco Use   Smoking status: Never   Smokeless tobacco: Never  Vaping Use   Vaping status: Never Used  Substance and Sexual Activity   Alcohol use: No   Drug use: No   Sexual activity: Yes    Birth control/protection: None  Other Topics Concern   Not on file  Social History Narrative   Self employed      Hobbies: Basketball couch    Social Drivers of Health   Financial Resource Strain: Low Risk  (04/16/2024)   Received [facility]   Overall Financial Resource Strain (CARDIA)    Difficulty of Paying Living Expenses: Not hard at all  Food Insecurity: No Food Insecurity (04/16/2024)   Received from [facility]    Hunger Vital Sign    Within the past 12 months, you worried that your food would run out before you got the money to buy more.: Never true    Within the past 12 months, the food you bought just didn't last and you didn't have money to get more.: Never true  Transportation Needs: No Transportation Needs (04/16/2024)   Received from [facility] - Transportation    In the past 12 months, has lack of transportation kept you from medical appointments or from getting medications?: No    Lack of Transportation (Non-Medical): No  Physical Activity: Sufficiently Active (04/01/2024)   Exercise Vital Sign    Days of Exercise per Week: 5 days    Minutes of Exercise per Session: 30 min  Stress: No Stress Concern Present (04/01/2024)   [employer] of Occupational Health - Occupational Stress Questionnaire    Feeling of Stress : Not at all  Social Connections: Socially Integrated (04/01/2024)   Social Connection and Isolation Panel    Frequency of Communication with Friends and Family: More than three times a week    Frequency of Social Gatherings with Friends and Family: More than three times a week    Attends Religious Services: More than 4 times per year    Active Member of [employer] or Organizations: Yes    Attends Banker Meetings: 1 to 4 times per year    Marital Status: Married     Family History: The patient's family history includes Cancer (age of onset: 33) in his mother; Diabetes in his father; Hypertension in his mother.  ROS:   Please see the history of present illness.     All other systems reviewed and are negative.  EKGs/Labs/Other Studies Reviewed:    The following studies were reviewed today:        Recent Labs: 07/31/2024: ALT 15; BUN 16; Creatinine, Ser 1.30; Hemoglobin 14.2; Platelets 157.0; Potassium 3.8; Sodium 143; TSH 1.34  Recent Lipid Panel    Component Value Date/Time   CHOL 122 07/31/2024 1103   TRIG 69.0 07/31/2024  1103   HDL 46.40 07/31/2024 1103   CHOLHDL 3 07/31/2024 1103   VLDL 13.8 07/31/2024 1103   LDLCALC 62 07/31/2024 1103    Physical Exam:    VS:  BP 122/72 (BP Location: Left Arm, Patient Position: Sitting, Cuff Size: Normal)   Pulse 70   Ht 6' 2 (1.88 m)   Wt 216 lb 3.2 oz (98.1 kg)   SpO2 97%   BMI 27.76 kg/m     Wt Readings from Last 3 Encounters:  08/06/24 216 lb 3.2  oz (98.1 kg)  07/31/24 223 lb (101.2 kg)  06/08/24 223 lb 6.4 oz (101.3 kg)     GEN:  Well nourished, well developed in no acute distress HEENT: Normal NECK: No JVD; No carotid bruits CARDIAC: RRR, no murmurs, rubs, gallops RESPIRATORY:  Clear to auscultation without rales, wheezing or rhonchi  ABDOMEN: Soft, non-tender, non-distended MUSCULOSKELETAL:  No edema; No deformity  SKIN: Warm and dry NEUROLOGIC:  Alert and oriented x 3 PSYCHIATRIC:  Normal affect   ASSESSMENT:   . 1. Primary hypertension   2. NICM (nonischemic cardiomyopathy) (HCC)    PLAN:    NICM initial EF 20%, last EF 35-40%.  Denies shortness of breath, appears euvolemic.  NYHA class II symptoms.  Continue Coreg  6.25 mg twice daily, Entresto  to 97/103 mg twice daily, Aldactone  25 mg daily.  Torsemide  20 mg daily as needed.  Obtain CMR.  Refer to advanced heart failure clinic Hypertension, BP controlled.  Coreg  6.25 mg twice daily, continue Entresto  97/103 mg twice daily, Aldactone  25 mg daily.  Follow-up in 6 months.   Medication Adjustments/Labs and Tests Ordered: Current medicines are reviewed at length with the patient today.  Concerns regarding medicines are outlined above.  Orders Placed This Encounter  Procedures   MR CARDIAC MORPHOLOGY W WO CONTRAST   AMB referral to [facility]     Meds ordered this encounter  Medications   FARXIGA  10 MG TABS tablet    Sig: Take 1 tablet (10 mg total) by mouth daily.    Dispense:  30 tablet    Refill:  3   sacubitril -valsartan  (ENTRESTO ) 97-103 MG    Sig: Take 1 tablet by  mouth 2 (two) times daily.    Dispense:  180 tablet    Refill:  2      Patient Instructions  Medication Instructions:  Your physician recommends that you continue on your current medications as directed. Please refer to the Current Medication list given to you today.   *If you need a refill on your cardiac medications before your next appointment, please call your pharmacy*  Lab Work: Your provider would like for you to have following labs drawn today BMP, CBC.   If you have labs (blood work) drawn today and your tests are completely normal, you will receive your results only by: MyChart Message (if you have MyChart) OR A paper copy in the mail If you have any lab test that is abnormal or we need to change your treatment, we will call you to review the results.  Testing/Procedures:   [facility] [address] [place], [place] [address] Please go to the [facility] and check-in with the desk attendant.   Magnetic resonance imaging (MRI) is a painless test that produces images of the inside of the body without using Xrays.  During an MRI, strong magnets and radio waves work together in a Data processing manager to form detailed images.   MRI images may provide more details about a medical condition than X-rays, CT scans, and ultrasounds can provide.  You may be given earphones to listen for instructions.  You may eat a light breakfast and take medications as ordered with the exception of furosemide , hydrochlorothiazide, chlorthalidone or spironolactone  (or any other fluid pill). If you are undergoing a stress MRI, please avoid stimulants for 12 hr prior to test. (I.e. Caffeine, nicotine, chocolate, or antihistamine medications)  If your provider has ordered anti-anxiety medications for this test, then you will need a driver.  An IV will be inserted into one of your veins. Contrast material will be injected into your IV. It will leave your body through your  urine within a day. You may be told to drink plenty of fluids to help flush the contrast material out of your system.  You will be asked to remove all metal, including: Watch, jewelry, and other metal objects including hearing aids, hair pieces and dentures. Also wearable glucose monitoring systems (ie. Freestyle Libre and Omnipods) (Braces and fillings normally are not a problem.)   TEST WILL TAKE APPROXIMATELY 1 HOUR  PLEASE NOTIFY SCHEDULING AT LEAST 24 HOURS IN ADVANCE IF YOU ARE UNABLE TO KEEP YOUR APPOINTMENT. [phone]  For more information and frequently asked questions, please visit our website : [email or web address]  Please call the Cardiac Imaging Nurse Navigators with any questions/concerns. [phone] Office    Follow-Up: At [facility], you and your health needs are our priority.  As part of our continuing mission to provide you with exceptional heart care, our providers are all part of one team.  This team includes your primary Cardiologist (physician) and Advanced Practice Providers or APPs (Physician Assistants and Nurse Practitioners) who all work together to provide you with the care you need, when you need it.  Your next appointment:   5 month(s)  Provider:   You may see Redell Cave, MD or one of the following Advanced Practice Providers on your designated Care Team:   Lonni Meager, NP Lesley Maffucci, PA-C Bernardino Bring, PA-C Cadence [place], PA-C Tylene Lunch, NP Barnie Hila, NP    We recommend signing up for the patient portal called MyChart.  Sign up information is provided on this After Visit Summary.  MyChart is used to connect with patients for Virtual Visits (Telemedicine).  Patients are able to view lab/test results, encounter notes, upcoming appointments, etc.  Non-urgent messages can be sent to your provider as well.   To learn more about what you can do with MyChart, go to [email or web address].               Signed, Redell Cave, MD  08/06/2024 10:01 AM    [facility]

## 2024-08-17 ENCOUNTER — Telehealth: Payer: Self-pay | Admitting: Cardiology

## 2024-08-17 NOTE — Telephone Encounter (Signed)
 Called to confirm/remind patient of their appointment at the [facility] on 08/18/24.   Appointment:   [x] Confirmed  [] Left mess   [] No answer/No voice mail  [] VM Full/unable to leave message  [] Phone not in service  Patient reminded to bring all medications and/or complete list.  Confirmed patient has transportation. Gave directions, instructed to utilize valet parking.

## 2024-08-18 ENCOUNTER — Ambulatory Visit: Attending: Cardiology | Admitting: Cardiology

## 2024-08-18 ENCOUNTER — Encounter: Payer: Self-pay | Admitting: Cardiology

## 2024-08-18 ENCOUNTER — Encounter (HOSPITAL_COMMUNITY): Payer: Self-pay

## 2024-08-18 DIAGNOSIS — I110 Hypertensive heart disease with heart failure: Secondary | ICD-10-CM | POA: Insufficient documentation

## 2024-08-18 DIAGNOSIS — I5022 Chronic systolic (congestive) heart failure: Secondary | ICD-10-CM | POA: Diagnosis present

## 2024-08-18 DIAGNOSIS — Z79899 Other long term (current) drug therapy: Secondary | ICD-10-CM | POA: Diagnosis not present

## 2024-08-18 DIAGNOSIS — I428 Other cardiomyopathies: Secondary | ICD-10-CM | POA: Diagnosis not present

## 2024-08-18 DIAGNOSIS — Z7984 Long term (current) use of oral hypoglycemic drugs: Secondary | ICD-10-CM | POA: Diagnosis not present

## 2024-08-18 MED ORDER — CARVEDILOL 12.5 MG PO TABS: 12.5000 mg | ORAL_TABLET | Freq: Two times a day (BID) | ORAL | 5 refills | Status: DC

## 2024-08-18 NOTE — Progress Notes (Unsigned)
   ADVANCED HEART FAILURE NEW PATIENT CLINIC NOTE  Referring Physician: Darliss Rogue, MD  Primary Care: Wendee Lynwood HERO, NP Primary Cardiologist:  HPI: [name] is a 48 y.o. male with a PMH of NICM, HTN, diabetes, HLD who presents for initial visit for further evaluation and treatment of heart failure/cardiomyopathy.      Patient initially seen in 2020 for chest tightness and shortness of breath.  Found to have new systolic heart failure, ejection fraction initially reduced, improved to 40 to 45% most recently.  Appears stated been overall doing well, compliant with his medications.     SUBJECTIVE:  Patient overall reports that he has been doing fairly well.  No significant change in his symptoms despite mild drop in ejection fraction.  He denies any significant volume symptoms including lower extremity edema or, orthopnea, PND.  He stays fairly active, though is on disability from back issues.  Denies any active or recent chest pain.  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  PMH, current medications, allergies, social history, and family history reviewed in epic.  PHYSICAL EXAM: Vitals:   08/18/24 1020  BP: (!) 131/94  Pulse: 81  SpO2: 100%   GENERAL: Well nourished and in no apparent distress at rest.  PULM:  Normal work of breathing, clear to auscultation bilaterally. Respirations are unlabored.  CARDIAC:  JVP: Flat         Normal rate with regular rhythm. No murmurs, rubs or gallops.  None edema. Warm and well perfused extremities. ABDOMEN: Soft, non-tender, non-distended. NEUROLOGIC: Patient is oriented x3 with no focal or lateralizing neurologic deficits.    DATA REVIEW  ECG: 02/2024: Normal EKG  ECHO: 05/07/2024: LVEF 35 to 40%, grade 1 diastolic dysfunction, reduced strain, normal RV size and function, no significant valvular disease 11/06/2022: LVEF 40 to 45%  CATH: CCTA: CAC score of 0, normal coronary origin with right dominance, no evidence  of CAD   ASSESSMENT & PLAN:  Chronic heart failure with midrange ejection fraction: Longstanding history of systolic heart failure.  NICM. Initially improved with medical therapy, now small reduction in ejection fraction, still mid range.  Cardiac MRI previously ordered, scheduled for later this week. - Agree with cardiac MRI for evaluation of chronic heart failure - Increase carvedilol  to 12.5 mg twice daily - Stable NYHA class II symptoms, euvolemic - Continue Farxiga  10 mg daily, Entresto  97/103 mg twice daily - Continue spironolactone  25 mg daily - Excellent job by cardiology titrating medical therapy - Does not meet criteria for ICD at this time - Consider genetic testing at next visit  Hypertension: Blood pressure is mildly elevated in clinic today.  - Increase coreg  as above - Continue therapy as noted above  Follow up in 2-3 months  Morene Brownie, MD Advanced Heart Failure Mechanical Circulatory Support 08/18/24

## 2024-08-18 NOTE — Patient Instructions (Signed)
 Medication Changes:  INCREASE Carvedilol  12.5mg  (1 tab) two times daily   Follow-Up in: Please follow up with the Advanced [facility] Failure Clinic in 3 months with Dr. Gardenia.    Thank you for choosing [facility].    At the Advanced [facility] Failure Clinic, you and your health needs are our priority. We have a designated team specialized in the treatment of Heart Failure. This Care Team includes your primary Heart Failure Specialized Cardiologist (physician), Advanced Practice Providers (APPs- Physician Assistants and Nurse Practitioners), and Pharmacist who all work together to provide you with the care you need, when you need it.   You may see any of the following providers on your designated Care Team at your next follow up:  Dr. Toribio Fuel Dr. Ezra Shuck Dr. Ria Gardenia Dr. Morene Brownie Ellouise Class, FNP Jaun Bash, RPH-CPP  Please be sure to bring in all your medications bottles to every appointment.   Need to Contact Us :  If you have any questions or concerns before your next appointment please send us  a message through [place] or call our office at [phone].    TO LEAVE A MESSAGE FOR THE NURSE SELECT OPTION 2, PLEASE LEAVE A MESSAGE INCLUDING: YOUR NAME DATE OF BIRTH CALL BACK NUMBER REASON FOR CALL**this is important as we prioritize the call backs  YOU WILL RECEIVE A CALL BACK THE SAME DAY AS LONG AS YOU CALL BEFORE 4:00 PM

## 2024-08-19 ENCOUNTER — Other Ambulatory Visit: Payer: Self-pay | Admitting: Cardiology

## 2024-08-19 ENCOUNTER — Ambulatory Visit: Payer: Self-pay | Admitting: Cardiology

## 2024-08-19 ENCOUNTER — Ambulatory Visit
Admission: RE | Admit: 2024-08-19 | Discharge: 2024-08-19 | Disposition: A | Discharge status: Home / Self Care | Source: Ambulatory Visit | Attending: Cardiology | Admitting: Cardiology

## 2024-08-19 DIAGNOSIS — I428 Other cardiomyopathies: Secondary | ICD-10-CM

## 2024-08-19 MED ORDER — GADOBUTROL 1 MMOL/ML IV SOLN
13.0000 mL | Freq: Once | INTRAVENOUS | Status: AC | PRN
  Administered 2024-08-19: 13 mL via INTRAVENOUS

## 2024-09-23 ENCOUNTER — Ambulatory Visit: Admitting: Urology

## 2024-09-28 NOTE — Progress Notes (Deleted)
 09/30/2024 12:29 PM   [name] September 22, 1976 [id number]  Referring provider: Wendee Lynwood HERO, NP [address] [place],  [place] [address]  Urological history: 1.  Erectile dysfunction - Failed PDE 5 inhibitors  No chief complaint on file.  HPI: [name] is a 48 y.o. man who presents today for erectile dysfunction.  Previous records reviewed.  We last saw him in our office in 2021, and at that time he was using ICI for erections.    SHIM ***  He does not have confidence that he could get and keep an erection, his erections are not firm enough for penetrative intercourse, he has difficulty maintaining his erections,  and he is not finding intercourse satisfactory for him.  ***  Patient still having spontaneous erections.  ***   He denies any pain or curvature with erections.    He is not able to ejaculate, has pain with ejaculation, and has blood in his ejaculate fluid.   ***  PSA (07/2024) 0.33  Testosterone  level pending   Cholesterol (07/2024) normal   Hemoglobin A1c (07/2024) 8.2  TSH (07/2024) 1.34   Tried and failed PDE5i's    PMH: Past Medical History:  Diagnosis Date   CHF (congestive heart failure) (HCC)    CKD (chronic kidney disease), stage III (HCC)    Diabetes mellitus    ED (erectile dysfunction)    Hypertension     Surgical History: Past Surgical History:  Procedure Laterality Date   ANKLE SURGERY     both    APPENDECTOMY     back fusion     l4,l5   BACK SURGERY     COLONOSCOPY N/A 06/08/2024   Procedure: COLONOSCOPY;  Surgeon: Jinny Carmine, MD;  Location: [facility] ENDOSCOPY;  Service: Endoscopy;  Laterality: N/A;   LAPAROSCOPIC APPENDECTOMY N/A 03/28/2019   Procedure: APPENDECTOMY LAPAROSCOPIC;  Surgeon: Tye Millet, DO;  Location: [facility] ORS;  Service: General;  Laterality: N/A;   NASAL SINUS SURGERY     SHOULDER ARTHROSCOPY WITH LABRAL REPAIR Left     Home Medications:  Allergies as of 09/30/2024       Reactions    Food    Chicken, and peaches        Medication List        Accurate as of September 28, 2024 12:29 PM. If you have any questions, ask your nurse or doctor.          atorvastatin  40 MG tablet Commonly known as: LIPITOR Take 1 tablet (40 mg total) by mouth daily at 6 PM.   carvedilol  12.5 MG tablet Commonly known as: COREG  Take 1 tablet (12.5 mg total) by mouth 2 (two) times daily.   DULoxetine  20 MG capsule Commonly known as: CYMBALTA  Take 2 capsules by mouth daily.   Farxiga  10 MG Tabs tablet Generic drug: dapagliflozin propanediol  Take 1 tablet (10 mg total) by mouth daily.   Mounjaro 2.5 MG/0.5ML Pen Generic drug: tirzepatide Inject 1.5 mg into the skin once a week.   pregabalin  50 MG capsule Commonly known as: LYRICA  Take 1 capsule (50 mg total) by mouth in the morning and at bedtime.   sacubitril -valsartan  97-103 MG Commonly known as: ENTRESTO  Take 1 tablet by mouth 2 (two) times daily.   sildenafil  100 MG tablet Commonly known as: VIAGRA  Take 100 mg by mouth daily as needed.   spironolactone  25 MG tablet Commonly known as: ALDACTONE  Take 1 tablet (25 mg total) by mouth daily.  torsemide  20 MG tablet Commonly known as: DEMADEX  Take 20 mg by mouth daily as needed.   Tresiba FlexTouch 200 UNIT/ML FlexTouch Pen Generic drug: insulin  degludec 35 units once daily        Allergies:  Allergies  Allergen Reactions   Food     Chicken, and peaches    Family History: Family History  Problem Relation Age of Onset   Cancer Mother 19       breast cancer   Hypertension Mother    Diabetes Father     Social History:  reports that he has never smoked. He has never used smokeless tobacco. He reports that he does not drink alcohol and does not use drugs.  ROS: Pertinent ROS in HPI  Physical Exam: There were no vitals taken for this visit.  Constitutional:  Well nourished. Alert and oriented, No acute distress. HEENT: [place] AT, moist mucus  membranes.  Trachea midline, no masses. Cardiovascular: No clubbing, cyanosis, or edema. Respiratory: Normal respiratory effort, no increased work of breathing. GI: Abdomen is soft, non tender, non distended, no abdominal masses. Liver and spleen not palpable.  No hernias appreciated.  Stool sample for occult testing is not indicated.   GU: No CVA tenderness.  No bladder fullness or masses.  Patient with circumcised/uncircumcised phallus. ***Foreskin easily retracted***  Urethral meatus is patent.  No penile discharge. No penile lesions or rashes. Scrotum without lesions, cysts, rashes and/or edema.  Testicles are located scrotally bilaterally. No masses are appreciated in the testicles. Left and right epididymis are normal. Rectal: Patient with  normal sphincter tone. Anus and perineum without scarring or rashes. No rectal masses are appreciated. Prostate is approximately *** grams, *** nodules are appreciated. Seminal vesicles are normal. Skin: No rashes, bruises or suspicious lesions. Lymph: No cervical or inguinal adenopathy. Neurologic: Grossly intact, no focal deficits, moving all 4 extremities. Psychiatric: Normal mood and affect.  Laboratory Data: See Epic and HPI   I have reviewed the labs.   Pertinent Imaging: N/A   Assessment & Plan:  ***  1. Erectile dysfunction  I explained that conditions like diabetes, hypertension, coronary artery disease, peripheral vascular disease, smoking, alcohol consumption, age, sleep apnea and BPH can diminish the ability to have an erection ***  I explained the ED may be a risk marker for underlying CVD and he should follow up with PCP for further studies ***  We will obtain a serum testosterone  level at this time; if it is abnormal we will need to repeat the study for confirmation ***  Explained that moderate to vigorous aerobic exercise for 40 minutes 4 times per week can decrease erectile problems caused by physical inactivity, obesity,  hypertension, metabolic syndrome and/or cardiovascular diseases ***  We discussed trying a *** different PDE5 inhibitor, intra-urethral suppositories, ICI, vacuum erection devices, Li-SWT, and penile prosthesis implantation   No follow-ups on file.  These notes generated with voice recognition software. I apologize for typographical errors.  CLOTILDA HELON RIGGERS  [facility] [address]  [address] [place], [place] [address] [phone]

## 2024-09-30 ENCOUNTER — Ambulatory Visit: Admitting: Urology

## 2024-09-30 DIAGNOSIS — N529 Male erectile dysfunction, unspecified: Secondary | ICD-10-CM

## 2024-10-02 ENCOUNTER — Other Ambulatory Visit: Payer: Self-pay | Admitting: Physical Medicine & Rehabilitation

## 2024-10-02 DIAGNOSIS — M542 Cervicalgia: Secondary | ICD-10-CM

## 2024-10-13 ENCOUNTER — Ambulatory Visit
Admission: RE | Admit: 2024-10-13 | Discharge: 2024-10-13 | Disposition: A | Source: Ambulatory Visit | Attending: Physical Medicine & Rehabilitation | Admitting: Physical Medicine & Rehabilitation

## 2024-10-13 DIAGNOSIS — M542 Cervicalgia: Secondary | ICD-10-CM

## 2024-10-22 ENCOUNTER — Telehealth: Payer: Self-pay | Admitting: Family

## 2024-10-22 NOTE — Telephone Encounter (Signed)
 Called to confirm/remind patient of their appointment at the Advanced [facility] Failure Clinic on 12/14/23.   Appointment:   [x] Confirmed  [] Left mess   [] No answer/No voice mail  [] VM Full/unable to leave message  [] Phone not in service  Patient reminded to bring all medications and/or complete list.  Confirmed patient has transportation. Gave directions, instructed to utilize valet parking.

## 2024-10-22 NOTE — Progress Notes (Unsigned)
   ADVANCED HEART FAILURE CLINIC NOTE  Referring Physician: Wendee Lynwood HERO, NP  Primary Care: Wendee Lynwood HERO, NP HF Provider: Odis Brownie, MD  Chief Complaint: shortness of breath   HPI: [name] is a 48 y.o. male with a PMH of NICM, HTN, diabetes, HLD who presents for initial visit for further evaluation and treatment of heart failure/cardiomyopathy.   Patient initially seen in 2020 for chest tightness and shortness of breath.  Found to have new systolic heart failure, ejection fraction initially reduced, improved to 40 to 45% most recently.   SUBJECTIVE:  He presents today for a HF follow-up visit with a chief complaint of shortness of breath at times. Has associated upper back muscle heaviness at times after lifting heavy items for extended periods of time. Has chronic chest tightness, occasional dizziness. Sleeping well. Just took home morning medication about 1 hour ago. Had cMRI done 10/25 (results below).   ROS: All systems negative except what is listed in HPI, PMH and Problem List  PMH, current medications, allergies, social history, and family history reviewed in epic.  PHYSICAL EXAM:  General: Well appearing.  Cor: No JVD. Regular rhythm, rate.  Lungs: clear Abdomen: soft, nontender, nondistended. Extremities: no edema Neuro:. Affect pleasant    Vitals:   10/23/24 1036  BP: (!) 138/107  Pulse: 81  SpO2: 98%  Weight: 225 lb 12.8 oz (102.4 kg)   Wt Readings from Last 3 Encounters:  10/23/24 225 lb 12.8 oz (102.4 kg)  08/18/24 223 lb (101.2 kg)  08/06/24 216 lb 3.2 oz (98.1 kg)   Lab Results  Component Value Date   CREATININE 1.30 07/31/2024   CREATININE 1.40 (H) 06/01/2024   CREATININE 1.31 06/20/2023   DATA REVIEW  ECG: 02/2024: Normal EKG  ECHO: 05/07/2024: LVEF 35 to 40%, grade 1 diastolic dysfunction, reduced strain, normal RV size and function, no significant valvular disease 11/06/2022: LVEF 40 to 45%  CATH: CCTA: CAC score of 0, normal  coronary origin with right dominance, no evidence of CAD  cMRI: 08/19/24: EF 40%. 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    ASSESSMENT & PLAN:  Chronic heart failure with midrange ejection fraction: Longstanding history of systolic heart failure.  NICM. Initially improved with medical therapy, now small reduction in ejection fraction, still mid range.cMRI 08/19/24: EF 40%. 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  - Begin eliquis  5mg  BID due to risk of blood clots with noncompaction on cMRI - Denies knowledge of any family members with noncompaction or HF in general. Will do invitae genetic test today.  - Increase carvedilol  to 25 mg twice daily - Stable NYHA class II symptoms, euvolemic - Continue Farxiga  10 mg daily - Continue Entresto  97/103 mg twice daily - Continue spironolactone  25 mg daily - Continue torsemide  20mg  daily PRN. Hasn't taken this in awhile.  - Does not meet criteria for ICD at this time - BMET 10/19/24 reviewed: sodium 143, potassium 4.0, creatinine 1.3, GFR 67  Hypertension:  - BP 138/107 - Increase coreg  as above - Continue therapy as noted above  HLD: - LDL 10/19/24 was 52 - Continue atorvastatin  40mg  daily   Return in 1 month to see HF MD, sooner if needed.   I spent 38 minutes reviewing records, interviewing/ examing patient and managing plan/ orders.   Ellouise DELENA Class [facility] 10/22/24

## 2024-10-23 ENCOUNTER — Encounter: Admitting: Cardiology

## 2024-10-23 ENCOUNTER — Other Ambulatory Visit (HOSPITAL_COMMUNITY): Payer: Self-pay

## 2024-10-23 ENCOUNTER — Telehealth (HOSPITAL_COMMUNITY): Payer: Self-pay

## 2024-10-23 ENCOUNTER — Ambulatory Visit: Attending: Family | Admitting: Family

## 2024-10-23 ENCOUNTER — Encounter: Payer: Self-pay | Admitting: Family

## 2024-10-23 VITALS — BP 138/107 | HR 81 | Wt 225.8 lb

## 2024-10-23 DIAGNOSIS — I10 Essential (primary) hypertension: Secondary | ICD-10-CM

## 2024-10-23 DIAGNOSIS — I428 Other cardiomyopathies: Secondary | ICD-10-CM

## 2024-10-23 DIAGNOSIS — E7800 Pure hypercholesterolemia, unspecified: Secondary | ICD-10-CM

## 2024-10-23 MED ORDER — CARVEDILOL 25 MG PO TABS: 25.0000 mg | ORAL_TABLET | Freq: Two times a day (BID) | ORAL | 1 refills | Status: AC

## 2024-10-23 MED ORDER — FARXIGA 10 MG PO TABS: 10.0000 mg | ORAL_TABLET | Freq: Every day | ORAL | 1 refills | Status: AC

## 2024-10-23 MED ORDER — APIXABAN 5 MG PO TABS: 5.0000 mg | ORAL_TABLET | Freq: Two times a day (BID) | ORAL | 3 refills | Status: AC

## 2024-10-23 MED ORDER — SACUBITRIL-VALSARTAN 97-103 MG PO TABS: 1.0000 | ORAL_TABLET | Freq: Two times a day (BID) | ORAL | 1 refills | Status: AC

## 2024-10-23 MED ORDER — SPIRONOLACTONE 25 MG PO TABS: 25.0000 mg | ORAL_TABLET | Freq: Every day | ORAL | 1 refills | Status: DC

## 2024-10-23 NOTE — Patient Instructions (Signed)
 Medication Changes:  START Eliquis  5mg  (1 tab) two times daily  INCREASE Carvedilol  to  25mg  (1 tab) two times daily   Testing/Procedures:  Genetic testing has been collected, this has to be sent to [place]  for processing and can take 1-2 weeks for us  to get results back.  We will let you know the results once reviewed by your provider.   Follow-Up in: Please follow up with the Advanced Heart Failure Clinic in 1-2 months with Dr. Zenaida. We are currently working on that schedule. We will give you give us  a call in order to schedule your appointment.    Thank you for choosing [facility].    At the Advanced Heart Failure Clinic, you and your health needs are our priority. We have a designated team specialized in the treatment of Heart Failure. This Care Team includes your primary Heart Failure Specialized Cardiologist (physician), Advanced Practice Providers (APPs- Physician Assistants and Nurse Practitioners), and Pharmacist who all work together to provide you with the care you need, when you need it.   You may see any of the following providers on your designated Care Team at your next follow up:  Dr. Toribio Fuel Dr. Ezra Shuck Dr. Ria Commander Dr. Morene Zenaida Ellouise Class, FNP Jaun Bash, RPH-CPP  Please be sure to bring in all your medications bottles to every appointment.   Need to Contact Us :  If you have any questions or concerns before your next appointment please send us  a message through [place] or call our office at [phone].    TO LEAVE A MESSAGE FOR THE NURSE SELECT OPTION 2, PLEASE LEAVE A MESSAGE INCLUDING: YOUR NAME DATE OF BIRTH CALL BACK NUMBER REASON FOR CALL**this is important as we prioritize the call backs  YOU WILL RECEIVE A CALL BACK THE SAME DAY AS LONG AS YOU CALL BEFORE 4:00 PM

## 2024-10-23 NOTE — Telephone Encounter (Signed)
 Advanced Heart Failure Patient Advocate Encounter  Test billing for this patient's current coverage ([employer]) returns a $0 copay for 90 day supply of Eliquis .  This test claim was processed through [facility]- copay amounts may vary at other pharmacies due to pharmacy/plan contracts, or as the patient moves through the different stages of their insurance plan.  Rachel DEL, CPhT Rx Patient Advocate Phone: [phone]

## 2024-10-23 NOTE — Progress Notes (Signed)
 TTR genetic testing collected via [facility] per Dr Zenaida.  Order form completed, signed and shipped with sample by [employer] to [facility].

## 2024-11-27 NOTE — Progress Notes (Unsigned)
 "  Referring Physician:  Wendee Lynwood HERO, NP [address] [place],  [place] [address]  Primary Physician:  Wendee Lynwood HERO, NP  History of Present Illness: [name] has a history of HTN, CHF, dilated cardiomyopathy, GERD, DM with polyneuropathy.   History of chronic pain x 20+ years, had surgery in 2006 and 2008 with improvement.   Last seen by me on 02/20/24 for his back and bilateral leg pain. Fusion at L4-L5 looked good on xrays. He has known left paracentral disc L2-L3 with bilateral lateral recess stensosis and moderate central stenosis. Also with left foraminal disc L3-L4 with bilateral lateral recess stenosis and mild left foraminal stenosis.   He was sent to PT and we discussed possible follow up with surgeon if no improvement (candidate for SCS?). He was lost to follow up.   He has constant LBP with constant right posterior leg pain to his foot. He has no left leg pain. Pain is worse with bending, twisting, standing, and walking. He has some tingling in both legs. No weakness.   He did not see improvement with PT for lumbar or cervical spine.    He has chronic constant neck pain into right shoulder blade, no arm pain. He has numbness and tingling in both hands > arms. He is dropping things. No dexterity or balance issues. Some relief with massage and chiropractor. He had nerve test years ago and was told he had carpal tunnel on both sides. He wears gloves as hands are always cold.  He is on lyrica , cymbalta . He is taking ELIQUIS .     Tobacco use: Does not smoke.    Bowel/Bladder Dysfunction: none   Conservative measures: chiropractor-Beshel Magnolia, massages Physical therapy: PT at [facility] for neck and back- initial visit on 10/14/24 with 3 more visits and last one on 11/16/24. He was not discharged.  Multimodal medical therapy including regular antiinflammatories:  Cymbalta , Lyrica  Injections:    -s/p bilateral S1 TFESI 02/14/2024 with no significant relief   -s/p left L3-4 and S1 TFESI 01/17/2024 with no significant relief -s/p right subacromial bursa injection 06/12/2022 with no significant relief  Had cervical injections years ago    Past Surgery:  Fusion L4-L5 in 2008 by [facility] L4-L5 discectomy in 2006   The symptoms are causing a significant impact on the patient's life.    Review of Systems:  A 10 point review of systems is negative, except for the pertinent positives and negatives detailed in the HPI.  Past Medical History: Past Medical History:  Diagnosis Date   CHF (congestive heart failure) (HCC)    CKD (chronic kidney disease), stage III (HCC)    Diabetes mellitus    ED (erectile dysfunction)    Hypertension     Past Surgical History: Past Surgical History:  Procedure Laterality Date   ANKLE SURGERY     both    APPENDECTOMY     back fusion     l4,l5   BACK SURGERY     COLONOSCOPY N/A 06/08/2024   Procedure: COLONOSCOPY;  Surgeon: Jinny Carmine, MD;  Location: [facility];  Service: Endoscopy;  Laterality: N/A;   LAPAROSCOPIC APPENDECTOMY N/A 03/28/2019   Procedure: APPENDECTOMY LAPAROSCOPIC;  Surgeon: Tye Millet, DO;  Location: [facility] ORS;  Service: General;  Laterality: N/A;   NASAL SINUS SURGERY     SHOULDER ARTHROSCOPY WITH LABRAL REPAIR Left     Allergies: Allergies as of 12/02/2024 - Review Complete 12/02/2024  Allergen Reaction Noted   Food  04/26/2014   Oxycodone  hcl Other (See Comments) 12/02/2014    Medications: Outpatient Encounter Medications as of 12/02/2024  Medication Sig   atorvastatin  (LIPITOR) 40 MG tablet Take 1 tablet (40 mg total) by mouth daily at 6 PM.   carvedilol  (COREG ) 25 MG tablet Take 1 tablet (25 mg total) by mouth 2 (two) times daily.   DULoxetine  (CYMBALTA ) 20 MG capsule Take 2 capsules by mouth daily.   FARXIGA  10 MG TABS tablet Take 1 tablet (10 mg total) by mouth daily.   MOUNJARO 2.5 MG/0.5ML Pen Inject 1.5 mg into the skin once a week.   pregabalin   (LYRICA ) 50 MG capsule Take 1 capsule (50 mg total) by mouth in the morning and at bedtime.   sacubitril -valsartan  (ENTRESTO ) 97-103 MG Take 1 tablet by mouth 2 (two) times daily.   sildenafil  (VIAGRA ) 100 MG tablet Take 100 mg by mouth daily as needed.   torsemide  (DEMADEX ) 20 MG tablet Take 20 mg by mouth daily as needed.   TRESIBA FLEXTOUCH 200 UNIT/ML FlexTouch Pen 35 units once daily   [DISCONTINUED] spironolactone  (ALDACTONE ) 25 MG tablet Take 1 tablet (25 mg total) by mouth daily.   apixaban  (ELIQUIS ) 5 MG TABS tablet Take 1 tablet (5 mg total) by mouth 2 (two) times daily. (Patient not taking: Reported on 12/02/2024)   No facility-administered encounter medications on file as of 12/02/2024.    Social History: Social History   Tobacco Use   Smoking status: Never   Smokeless tobacco: Never  Vaping Use   Vaping status: Never Used  Substance Use Topics   Alcohol use: No   Drug use: No    Family Medical History: Family History  Problem Relation Age of Onset   Cancer Mother 28       breast cancer   Hypertension Mother    Diabetes Father     Physical Examination: Vitals:   12/02/24 0912  BP: 132/80      Awake, alert, oriented to person, place, and time.  Speech is clear and fluent. Fund of knowledge is appropriate.   Cranial Nerves: Pupils equal round and reactive to light.  Facial tone is symmetric.    No posterior cervical tenderness. Mild tenderness in right trapezial region into his scapula.    No abnormal lesions on exposed skin.   Strength: Side Biceps Triceps Deltoid Interossei Grip Wrist Ext. Wrist Flex.  R 5 5 5 5 5 5 5   L 5 5 5 5 5 5 5    Side Iliopsoas Quads Hamstring PF DF EHL  R 5 5 5 5 5 5   L 5 5 5 5 5 5    Reflexes are 2+ and symmetric at the biceps, brachioradialis, patella and achilles.   Hoffman's is absent.  Clonus is not present.   Bilateral upper and lower extremity sensation is intact to light touch.     Minimal groin pain with IR/ER of  right hip. No pain with IR/ER or left hip.   Negative tinels at wrist and elbow bilaterally. Negative phalens at wrist bilaterally- hands are already numbness, does not get worse.   Gait is normal.     Medical Decision Making  Imaging: MRI cervical spine dated 10/13/24:  FINDINGS: Alignment: Straightening of the normal cervical lordosis. No significant listhesis.   Vertebrae: Vertebral body height maintained without acute or chronic fracture. Decreased T1 signal intensity within the visualized bone marrow, nonspecific, but most commonly related to anemia, smoking, or obesity. No worrisome osseous lesions. Mild degenerative reactive  endplate change with marrow edema present about the C6-7 interspace.   Cord: Normal signal and morphology.   Posterior Fossa, vertebral arteries, paraspinal tissues: Small remote lacunar infarct noted within the pons. Visualized brain and posterior fossa otherwise unremarkable. Craniocervical junction within normal limits. Paraspinous soft tissues within normal limits. Normal flow voids seen within the vertebral arteries bilaterally.   Disc levels:   C2-C3: Minimal disc bulge. Mild right-sided facet hypertrophy. No spinal stenosis. Foramina remain patent.   C3-C4: Left eccentric disc bulge with bilateral uncovertebral spurring. Minimal right-sided facet degeneration. No spinal stenosis. Mild left C4 foraminal narrowing. Right neural foramen remains patent.   C4-C5: Minimal disc bulge with right greater than left uncovertebral spurring. No spinal stenosis. Foramina remain patent.   C5-C6: Mild disc bulge with left greater than right uncovertebral spurring. No spinal stenosis. Moderate left C6 foraminal narrowing. Right neural foramen remains patent.   C6-C7: Disc bulge with bilateral uncovertebral spurring. No spinal stenosis. Moderate left C7 foraminal narrowing. Right neural foramen remains patent.   C7-T1:  Normal interspace.  No canal  or foraminal stenosis.   T1-2: Seen only on sagittal projection. Trace anterolisthesis without significant disc bulge. Left worse than right facet arthrosis. No spinal stenosis. Moderate left with mild right foraminal narrowing.   IMPRESSION: 1. Multilevel cervical spondylosis without significant spinal stenosis or overt neural impingement. 2. Multifactorial degenerative changes with resultant multilevel foraminal narrowing as above. Notable findings include mild left C4 foraminal stenosis, moderate left C6 and C7 foraminal narrowing, with moderate left and mild right foraminal stenosis at T1-2.     Electronically Signed   By: Morene Hoard M.D.   On: 10/17/2024 05:14  Assessment and Plan: [name] has a history of chronic pain x 20+ years, had surgery in 2006 and 2008 with improvement.   He has constant LBP with constant right posterior leg pain to his foot. He has no left leg pain. He has some tingling in both legs. No weakness  No relief with ESIs x 2. No relief with PT.    Fusion at L4-L5 looked good on xrays. He has known left paracentral disc L2-L3 with bilateral lateral recess stensosis and moderate central stenosis. Also with left foraminal disc L3-L4 with bilateral lateral recess stenosis and mild left foraminal stenosis.     He also has chronic constant neck pain into right shoulder blade, no arm pain. He has numbness and tingling in both hands > arms. He is dropping things. No dexterity or balance issues.   He has known cervical spondylosis with mild left foraminal stenosis C3-C4 and moderate left foraminal stenosis C5-C7.   Numbness/tingling in hands is suspicious for carpal tunnel syndrome. He had nerve test years ago and was told he had carpal tunnel bilaterally.   Treatment options discussed with patient and following plan made:   - Cervical xrays with flex/ext on his way out. Will message him with results.  - Will get PT notes from [facility]. - EMG/NCS of  bilateral upper extremities to evaluate for carpal tunnel syndrome. Orders to [facility].  - He does not want to proceed with further injections for lumbar spine. Does not want to revisit injections for neck pain.  - Will review imaging with Dr. Claudene or Dr. Clois to see if he is surgical candidate and message him with further plan.   PT notes received after his visit. He started PT at [facility] for neck and back on 10/14/24 and did 3 more visits with last one  on 11/16/24. He was not discharged.   I spent a total of 35 minutes in face-to-face and non-face-to-face activities related to this patient's care today including review of outside records, review of imaging, review of symptoms, physical exam, discussion of differential diagnosis, discussion of treatment options, and documentation.   ADDENDUM 12/03/24:  Patient reviewed with Dr. Claudene and he may be a surgical candidate for his lumbar spine. Will schedule f/u for him to discuss with Dr. Claudene.   Logan Boys PA-C Dept. of Neurosurgery  "

## 2024-12-02 ENCOUNTER — Encounter: Payer: Self-pay | Admitting: Orthopedic Surgery

## 2024-12-02 ENCOUNTER — Ambulatory Visit

## 2024-12-02 ENCOUNTER — Ambulatory Visit (INDEPENDENT_AMBULATORY_CARE_PROVIDER_SITE_OTHER): Admitting: Orthopedic Surgery

## 2024-12-02 VITALS — BP 132/80 | Ht 74.0 in | Wt 221.2 lb

## 2024-12-02 DIAGNOSIS — M4726 Other spondylosis with radiculopathy, lumbar region: Secondary | ICD-10-CM | POA: Diagnosis not present

## 2024-12-02 DIAGNOSIS — M542 Cervicalgia: Secondary | ICD-10-CM | POA: Diagnosis not present

## 2024-12-02 DIAGNOSIS — M4722 Other spondylosis with radiculopathy, cervical region: Secondary | ICD-10-CM

## 2024-12-02 DIAGNOSIS — M4802 Spinal stenosis, cervical region: Secondary | ICD-10-CM | POA: Diagnosis not present

## 2024-12-02 DIAGNOSIS — Z981 Arthrodesis status: Secondary | ICD-10-CM | POA: Diagnosis not present

## 2024-12-02 DIAGNOSIS — R200 Anesthesia of skin: Secondary | ICD-10-CM

## 2024-12-02 DIAGNOSIS — M48061 Spinal stenosis, lumbar region without neurogenic claudication: Secondary | ICD-10-CM

## 2024-12-02 DIAGNOSIS — M47812 Spondylosis without myelopathy or radiculopathy, cervical region: Secondary | ICD-10-CM

## 2024-12-02 DIAGNOSIS — M5416 Radiculopathy, lumbar region: Secondary | ICD-10-CM

## 2024-12-02 DIAGNOSIS — M47816 Spondylosis without myelopathy or radiculopathy, lumbar region: Secondary | ICD-10-CM

## 2024-12-02 NOTE — Patient Instructions (Signed)
 It was so nice to see you today. Thank you so much for coming in.    We did xrays of your neck today. I will message you with results.   We will work on getting your PT notes from [facility].   I want to get an EMG (nerve conduction test) of your arms to look into things further. I have ordered this and [facility] will call you to schedule. You can also call them at [phone].   I will review everything with Dr. Claudene or Dr. Clois and message you with their further recommendations.   Please do not hesitate to call if you have any questions or concerns. You can also message me in MyChart.   Glade Boys PA-C [phone]     The physicians and staff at [facility] are committed to providing excellent care. You may receive a survey asking for feedback about your experience at our office. We value you your feedback and appreciate you taking the time to to fill it out. The [facility] leadership team is also available to discuss your experience in person, feel free to contact us  [phone].

## 2024-12-04 NOTE — Progress Notes (Signed)
 "   Referring Physician:  Wendee Lynwood HERO, NP [address] [place],  [place] [address]  Primary Physician:  Wendee Lynwood HERO, NP  Discussed the use of AI scribe software for clinical note transcription with the patient, who gave verbal consent to proceed.  History of Present Illness [name] is a 49 year old male with prior lumbar spinal fusion and cervical spondylosis who presents for evaluation of worsening low back and neck pain.  Low back pain has progressively worsened over the year since his lumbar fusion via midline approach and is now more severe than before surgery. Pain radiates across the torso above the right hip into the mid back. He has radiation of pain and numbness down both legs, posteriorly on the right and wrapping to the anterior thigh and hip on the left. He denies leg heaviness but has persistent numbness tingling and radiating pain. Physical therapy and other conservative treatments have not helped. His last lumbar MRI was over a year ago, and his symptoms have worsened since.  He has chronic neck pain from cervical spondylosis with bilateral shoulder pain, worse on the right but recently increasing on the left. Neck pain is severe, disrupts sleep, and is worsened by neck movements, especially when lying down or turning his head, which causes a pinching sensation. He has not had neck injections or other interventional treatments in the past year, and prior physical therapy did not provide significant relief.  He is awaiting an EMG for evaluation of nerve-related symptoms in the arms and has not yet completed this test.   Tobacco use: Does not smoke.    Bowel/Bladder Dysfunction: none   Conservative measures: chiropractor-Beshel Avery, massages Physical therapy: PT at [facility] for neck and back- initial visit on 10/14/24 with 3 more visits and last one on 11/16/24. He was not discharged.  Multimodal medical therapy including regular antiinflammatories:   Cymbalta , Lyrica  Injections:    -s/p bilateral S1 TFESI 02/14/2024 with no significant relief  -s/p left L3-4 and S1 TFESI 01/17/2024 with no significant relief -s/p right subacromial bursa injection 06/12/2022 with no significant relief  Had cervical injections years ago    Past Surgery:  Fusion L4-L5 in 2008 by [facility] L4-L5 discectomy in 2006   The symptoms are causing a significant impact on the patient's life.    Review of Systems:  A 10 point review of systems is negative, except for the pertinent positives and negatives detailed in the HPI.  Past Medical History: Past Medical History:  Diagnosis Date   CHF (congestive heart failure) (HCC)    CKD (chronic kidney disease), stage III (HCC)    Diabetes mellitus    ED (erectile dysfunction)    Hypertension     Past Surgical History: Past Surgical History:  Procedure Laterality Date   ANKLE SURGERY     both    APPENDECTOMY     back fusion     l4,l5   BACK SURGERY     COLONOSCOPY N/A 06/08/2024   Procedure: COLONOSCOPY;  Surgeon: Jinny Carmine, MD;  Location: [facility] END[facility]COPY;  Service: Endoscopy;  Laterality: N/A;   LAPAROSCOPIC APPENDECTOMY N/A 03/28/2019   Procedure: APPENDECTOMY LAPAROSCOPIC;  Surgeon: Tye Millet, DO;  Location: [facility] ORS;  Service: General;  Laterality: N/A;   NASAL SINUS SURGERY     SHOULDER ARTHROSCOPY WITH LABRAL REPAIR Left     Allergies: Allergies as of 12/02/2024 - Review Complete 12/02/2024  Allergen Reaction Noted   Food  04/26/2014  Oxycodone  hcl Other (See Comments) 12/02/2014    Medications: Outpatient Encounter Medications as of 12/02/2024  Medication Sig   atorvastatin  (LIPITOR) 40 MG tablet Take 1 tablet (40 mg total) by mouth daily at 6 PM.   carvedilol  (COREG ) 25 MG tablet Take 1 tablet (25 mg total) by mouth 2 (two) times daily.   DULoxetine  (CYMBALTA ) 20 MG capsule Take 2 capsules by mouth daily.   FARXIGA  10 MG TABS tablet Take 1 tablet (10 mg total) by mouth  daily.   MOUNJARO 2.5 MG/0.5ML Pen Inject 1.5 mg into the skin once a week.   pregabalin  (LYRICA ) 50 MG capsule Take 1 capsule (50 mg total) by mouth in the morning and at bedtime.   sacubitril -valsartan  (ENTRESTO ) 97-103 MG Take 1 tablet by mouth 2 (two) times daily.   sildenafil  (VIAGRA ) 100 MG tablet Take 100 mg by mouth daily as needed.   torsemide  (DEMADEX ) 20 MG tablet Take 20 mg by mouth daily as needed.   TRESIBA FLEXTOUCH 200 UNIT/ML FlexTouch Pen 35 units once daily   [DISCONTINUED] spironolactone  (ALDACTONE ) 25 MG tablet Take 1 tablet (25 mg total) by mouth daily.   apixaban  (ELIQUIS ) 5 MG TABS tablet Take 1 tablet (5 mg total) by mouth 2 (two) times daily. (Patient not taking: Reported on 12/02/2024)   No facility-administered encounter medications on file as of 12/02/2024.    Social History: Social History   Tobacco Use   Smoking status: Never   Smokeless tobacco: Never  Vaping Use   Vaping status: Never Used  Substance Use Topics   Alcohol use: No   Drug use: No    Family Medical History: Family History  Problem Relation Age of Onset   Cancer Mother 64       breast cancer   Hypertension Mother    Diabetes Father     Physical Examination: Vitals:   12/02/24 0912  BP: 132/80      Awake, alert, oriented to person, place, and time.  Speech is clear and fluent. Fund of knowledge is appropriate.   Cranial Nerves: Pupils equal round and reactive to light.  Facial tone is symmetric.    No posterior cervical tenderness. Mild tenderness in right trapezial region into his scapula.    No abnormal lesions on exposed skin.   Strength: Side Biceps Triceps Deltoid Interossei Grip Wrist Ext. Wrist Flex.  R 5 5 5 5 5 5 5   L 5 5 5 5 5 5 5    Side Iliopsoas Quads Hamstring PF DF EHL  R 5 5 5 5 5 5   L 5 5 5 5 5 5    Reflexes are 2+ and symmetric at the biceps, brachioradialis, patella and achilles.   Hoffman's is absent.  Clonus is not present.   Bilateral upper and  lower extremity sensation is intact to light touch.     Gait is normal.     Medical Decision Making  Imaging: MRI cervical spine dated 10/13/24:  FINDINGS: Alignment: Straightening of the normal cervical lordosis. No significant listhesis.   Vertebrae: Vertebral body height maintained without acute or chronic fracture. Decreased T1 signal intensity within the visualized bone marrow, nonspecific, but most commonly related to anemia, smoking, or obesity. No worrisome osseous lesions. Mild degenerative reactive endplate change with marrow edema present about the C6-7 interspace.   Cord: Normal signal and morphology.   Posterior Fossa, vertebral arteries, paraspinal tissues: Small remote lacunar infarct noted within the pons. Visualized brain and posterior fossa otherwise unremarkable. Craniocervical junction  within normal limits. Paraspinous soft tissues within normal limits. Normal flow voids seen within the vertebral arteries bilaterally.   Disc levels:   C2-C3: Minimal disc bulge. Mild right-sided facet hypertrophy. No spinal stenosis. Foramina remain patent.   C3-C4: Left eccentric disc bulge with bilateral uncovertebral spurring. Minimal right-sided facet degeneration. No spinal stenosis. Mild left C4 foraminal narrowing. Right neural foramen remains patent.   C4-C5: Minimal disc bulge with right greater than left uncovertebral spurring. No spinal stenosis. Foramina remain patent.   C5-C6: Mild disc bulge with left greater than right uncovertebral spurring. No spinal stenosis. Moderate left C6 foraminal narrowing. Right neural foramen remains patent.   C6-C7: Disc bulge with bilateral uncovertebral spurring. No spinal stenosis. Moderate left C7 foraminal narrowing. Right neural foramen remains patent.   C7-T1:  Normal interspace.  No canal or foraminal stenosis.   T1-2: Seen only on sagittal projection. Trace anterolisthesis without significant disc bulge. Left  worse than right facet arthrosis. No spinal stenosis. Moderate left with mild right foraminal narrowing.   IMPRESSION: 1. Multilevel cervical spondylosis without significant spinal stenosis or overt neural impingement. 2. Multifactorial degenerative changes with resultant multilevel foraminal narrowing as above. Notable findings include mild left C4 foraminal stenosis, moderate left C6 and C7 foraminal narrowing, with moderate left and mild right foraminal stenosis at T1-2.     Electronically Signed   By: Morene Hoard M.D.   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 Symptoms have progressed over the past year despite physical therapy and conservative management. Surgical decompression is appropriate if updated imaging confirms isolated pathology at L2-3. Anticipated outcome is improved radicular pain and function with outpatient recovery in approximately six weeks. - Ordered updated lumbar spine MRI without contrast to assess for interval changes and exclude additional disc pathology prior to surgical intervention. - Discussed potential for L2-3 laminectomy and possible discectomy if imaging confirms isolated pathology.  Cervical spondylosis Chronic multilevel cervical spondylosis with prominent degenerative changes and facet arthropathy, most severe at C5-6, resulting in chronic axial neck pain and intermittent bilateral shoulder pain. Imaging demonstrates no spinal cord compression or instability. Symptoms are refractory to conservative measures. Surgical intervention is not indicated due to absence of instability or cord compression, but may be reconsidered if symptoms progress. Facet joint injections are recommended for pain management. Anticipated outcome is  reduction in neck pain and improved quality of life. - Referred to [place] pain management team for cervical facet joint injections. - Provided education regarding the degenerative nature of cervical spondylosis and the roles of physical therapy and pain management. - Discussed that surgical intervention (cervical fusion) is not indicated at this time, but may be considered if symptoms progress. - Agreed to proceed with the pain management team offering the shortest wait time.  Penne LELON Sharps MD "

## 2024-12-08 ENCOUNTER — Encounter: Payer: Self-pay | Admitting: Orthopedic Surgery

## 2024-12-08 NOTE — Telephone Encounter (Signed)
 Cervical xrays dated 12/02/24:  FINDINGS:   BONES: Vertebral body heights are maintained. Alignment is normal. No instability with flexion or extension.   DISCS AND DEGENERATIVE CHANGES: Degenerative disc disease at C5-C6 and C6-C7 with disc space narrowing and anterior spurring.   SOFT TISSUES: No prevertebral soft tissue swelling. The visualized lungs appear clear.   IMPRESSION: 1. No acute bony abnormality.   Electronically signed by: Franky Crease MD 12/07/2024 11:13 PM EST RP Workstation: [id number]  I have personally reviewed the images and agree with the above interpretation.

## 2024-12-16 ENCOUNTER — Ambulatory Visit: Admitting: Neurosurgery

## 2024-12-16 ENCOUNTER — Encounter: Payer: Self-pay | Admitting: Neurosurgery

## 2024-12-16 VITALS — BP 124/78 | Ht 73.0 in | Wt 218.4 lb

## 2024-12-16 DIAGNOSIS — M5116 Intervertebral disc disorders with radiculopathy, lumbar region: Secondary | ICD-10-CM

## 2024-12-16 DIAGNOSIS — Z981 Arthrodesis status: Secondary | ICD-10-CM

## 2024-12-16 DIAGNOSIS — M47816 Spondylosis without myelopathy or radiculopathy, lumbar region: Secondary | ICD-10-CM

## 2024-12-16 DIAGNOSIS — M48061 Spinal stenosis, lumbar region without neurogenic claudication: Secondary | ICD-10-CM

## 2024-12-16 DIAGNOSIS — M47812 Spondylosis without myelopathy or radiculopathy, cervical region: Secondary | ICD-10-CM

## 2025-01-04 ENCOUNTER — Ambulatory Visit: Admitting: Cardiology

## 2025-01-12 ENCOUNTER — Ambulatory Visit: Payer: Self-pay | Admitting: Allergy

## 2025-04-02 ENCOUNTER — Ambulatory Visit
